# Patient Record
Sex: Female | Born: 1935
Health system: Southern US, Community
[De-identification: ages and names within clinical notes are randomized; demographics above are authoritative.]

## PROBLEM LIST (undated history)

## (undated) DIAGNOSIS — F419 Anxiety disorder, unspecified: Secondary | ICD-10-CM

## (undated) DIAGNOSIS — R011 Cardiac murmur, unspecified: Secondary | ICD-10-CM

## (undated) DIAGNOSIS — K56609 Unspecified intestinal obstruction, unspecified as to partial versus complete obstruction: Secondary | ICD-10-CM

## (undated) DIAGNOSIS — M810 Age-related osteoporosis without current pathological fracture: Secondary | ICD-10-CM

## (undated) DIAGNOSIS — K219 Gastro-esophageal reflux disease without esophagitis: Secondary | ICD-10-CM

## (undated) DIAGNOSIS — C4490 Unspecified malignant neoplasm of skin, unspecified: Secondary | ICD-10-CM

## (undated) DIAGNOSIS — E785 Hyperlipidemia, unspecified: Secondary | ICD-10-CM

## (undated) DIAGNOSIS — R112 Nausea with vomiting, unspecified: Secondary | ICD-10-CM

## (undated) DIAGNOSIS — Z9889 Other specified postprocedural states: Secondary | ICD-10-CM

## (undated) HISTORY — PX: DILATION AND CURETTAGE OF UTERUS: SHX78

## (undated) HISTORY — DX: Unspecified malignant neoplasm of skin, unspecified: C44.90

## (undated) HISTORY — PX: BREAST BIOPSY: SHX20

## (undated) HISTORY — DX: Hyperlipidemia, unspecified: E78.5

## (undated) HISTORY — DX: Anxiety disorder, unspecified: F41.9

## (undated) HISTORY — DX: Age-related osteoporosis without current pathological fracture: M81.0

## (undated) HISTORY — DX: Gastro-esophageal reflux disease without esophagitis: K21.9

## (undated) HISTORY — PX: ESOPHAGOGASTRODUODENOSCOPY: SHX1529

## (undated) HISTORY — DX: Unspecified intestinal obstruction, unspecified as to partial versus complete obstruction: K56.609

## (undated) HISTORY — PX: TONSILLECTOMY: SUR1361

## (undated) HISTORY — PX: BREAST EXCISIONAL BIOPSY: SUR124

## (undated) HISTORY — PX: SKIN CANCER EXCISION: SHX779

## (undated) HISTORY — PX: CATARACT EXTRACTION W/ INTRAOCULAR LENS  IMPLANT, BILATERAL: SHX1307

---

## 2000-02-08 ENCOUNTER — Other Ambulatory Visit: Admission: RE | Admit: 2000-02-08 | Discharge: 2000-02-08 | Payer: Self-pay | Admitting: *Deleted

## 2002-10-26 ENCOUNTER — Other Ambulatory Visit: Admission: RE | Admit: 2002-10-26 | Discharge: 2002-10-26 | Payer: Self-pay | Admitting: Family Medicine

## 2003-10-23 HISTORY — PX: COLONOSCOPY: SHX174

## 2004-09-01 ENCOUNTER — Ambulatory Visit (HOSPITAL_COMMUNITY): Admission: RE | Admit: 2004-09-01 | Discharge: 2004-09-01 | Payer: Self-pay | Admitting: Gastroenterology

## 2006-09-16 ENCOUNTER — Other Ambulatory Visit: Admission: RE | Admit: 2006-09-16 | Discharge: 2006-09-16 | Payer: Self-pay | Admitting: Family Medicine

## 2012-09-11 ENCOUNTER — Other Ambulatory Visit: Payer: Self-pay | Admitting: Family Medicine

## 2012-09-11 DIAGNOSIS — Z1231 Encounter for screening mammogram for malignant neoplasm of breast: Secondary | ICD-10-CM

## 2012-10-07 ENCOUNTER — Encounter: Payer: Self-pay | Admitting: Gastroenterology

## 2012-10-17 ENCOUNTER — Ambulatory Visit
Admission: RE | Admit: 2012-10-17 | Discharge: 2012-10-17 | Disposition: A | Discharge status: Home / Self Care | Payer: Medicare Other | Source: Ambulatory Visit | Attending: Family Medicine | Admitting: Family Medicine

## 2012-10-17 ENCOUNTER — Ambulatory Visit: Payer: Self-pay

## 2012-10-17 DIAGNOSIS — Z1231 Encounter for screening mammogram for malignant neoplasm of breast: Secondary | ICD-10-CM

## 2012-10-29 ENCOUNTER — Ambulatory Visit: Payer: Self-pay | Admitting: Internal Medicine

## 2012-10-31 ENCOUNTER — Encounter: Payer: Self-pay | Admitting: Gastroenterology

## 2012-10-31 ENCOUNTER — Ambulatory Visit (INDEPENDENT_AMBULATORY_CARE_PROVIDER_SITE_OTHER): Payer: Medicare Other | Admitting: Gastroenterology

## 2012-10-31 VITALS — BP 108/64 | HR 78 | Ht 59.0 in | Wt 125.1 lb

## 2012-10-31 DIAGNOSIS — R1319 Other dysphagia: Secondary | ICD-10-CM

## 2012-10-31 DIAGNOSIS — R1310 Dysphagia, unspecified: Secondary | ICD-10-CM

## 2012-10-31 DIAGNOSIS — R1314 Dysphagia, pharyngoesophageal phase: Secondary | ICD-10-CM

## 2012-10-31 DIAGNOSIS — R1013 Epigastric pain: Secondary | ICD-10-CM

## 2012-10-31 DIAGNOSIS — K219 Gastro-esophageal reflux disease without esophagitis: Secondary | ICD-10-CM

## 2012-10-31 MED ORDER — DEXLANSOPRAZOLE 60 MG PO CPDR: 60.0000 mg | DELAYED_RELEASE_CAPSULE | Freq: Every day | ORAL | Status: DC

## 2012-10-31 NOTE — Progress Notes (Signed)
History of Present Illness:  This is a very pleasant 79 year old Caucasian female accompanied by her daughter today for her interview and exam.  This patient does not like to take medications and is very verbal about this concern.  She actually has been in good health all of her life without serious medical or surgical problems but is on Crestor and Zoloft.  She apparently had colonoscopy by Dr. Jeralene Peters 6-7 years ago which was apparently normal.  I do not have this record for review.  Her main complaint now is of intermittent postprandial discomfort in the subxiphoid area followed by emesis of partially digested food.  It is no liquid dysphagia or symptoms of aspiration or choking.  She really denies nausea, or any specific hepatobiliary complaints.  This pain and emesis happens almost daily ,and is accompanied by mild acid reflux symptoms.  She has been on Zantac 75 mg a day for several weeks with some improvement.  There is no history of typical regurgitation or burning substernal chest pain.  The patient has not had previous barium swallow, endoscopy or ultrasound exam.  Her bowels are regular and she denies melena, hematochezia, anorexia, weight loss, or any specific food intolerances.  Her daughter does suffer from gallbladder disease and has had cholecystectomy.  I have reviewed this patient's present history, medical and surgical past history, allergies and medications.     ROS: The remainder of the 10 point ROS is negative     Physical Exam: Blood pressure 108/64, pulse 78 and regular, and weight 125 pounds with a BMI of 25.27. General well developed well nourished patient in no acute distress, appearing their stated age Eyes PERRLA, no icterus, fundoscopic exam per opthamologist Skin no lesions noted Neck supple, no adenopathy, no thyroid enlargement, no tenderness Chest clear to percussion and auscultation Heart no significant murmurs, gallops or rubs noted Abdomen no hepatosplenomegaly  masses or tenderness, BS normal. . Extremities no acute joint lesions, edema, phlebitis or evidence of cellulitis. Neurologic patient oriented x 3, cranial nerves intact, no focal neurologic deficits noted. Psychological mental status normal and normal affect. , Assessment and plan: Probable prominent hiatal hernia with acid reflux and secondary peptic stricture of her esophagus.  She has no symptoms to suggest achalasia.  I am somewhat concerned about her abdominal pain and have ordered gallbladder ultrasound to be complete.  She is been changed from Zantac to Dexilant 60 mg every morning with standard antireflux maneuvers.  Outpatient endoscopy and probable endoscopy scheduled.  I have reviewed the risk and benefits of endoscopy and esophageal dilatation with the patient and her daughter in detail, and they have agreed to proceed as planned.  She apparently has been told by Dr. Clovis Riley that she does not need followup colonoscopy.  Patient video started acid reflux and its management shown to the patient and her daughter.  Encounter Diagnoses  Name Primary?  . Abdominal pain Yes  . GERD (gastroesophageal reflux disease)    CC: Cr. Lupe Carney

## 2012-10-31 NOTE — Patient Instructions (Addendum)
You have been scheduled for an endoscopy  with propofol. Please follow the written instructions given to you at your visit today. Please pick up your prep at the pharmacy within the next 1-3 days. If you use inhalers (even only as needed) or a CPAP machine, please bring them with you on the day of your procedure. You have been scheduled for an abdominal ultrasound at [facility] (1st floor of hospital) on 11/04/12  9 am . Please arrive 15 minutes prior to your appointment for registration. Make certain not to have anything to eat or drink 6 hours prior to your appointment. Should you need to reschedule your appointment, please contact radiology at [phone]. This test typically takes about 30 minutes to perform. Stop the Zantac and start the Dexilant samples provided to you.  A prescription has also been sent to your pharmacy.   Gastroesophageal Reflux Disease, Adult Gastroesophageal reflux disease (GERD) happens when acid from your stomach flows up into the esophagus. When acid comes in contact with the esophagus, the acid causes soreness (inflammation) in the esophagus. Over time, GERD may create small holes (ulcers) in the lining of the esophagus. CAUSES   Increased body weight. This puts pressure on the stomach, making acid rise from the stomach into the esophagus.  Smoking. This increases acid production in the stomach.  Drinking alcohol. This causes decreased pressure in the lower esophageal sphincter (valve or ring of muscle between the esophagus and stomach), allowing acid from the stomach into the esophagus.  Late evening meals and a full stomach. This increases pressure and acid production in the stomach.  A malformed lower esophageal sphincter. Sometimes, no cause is found. SYMPTOMS   Burning pain in the lower part of the mid-chest behind the breastbone and in the mid-stomach area. This may occur twice a week or more often.  Trouble swallowing.  Sore throat.  Dry  cough.  Asthma-like symptoms including chest tightness, shortness of breath, or wheezing. DIAGNOSIS  Your caregiver may be able to diagnose GERD based on your symptoms. In some cases, X-rays and other tests may be done to check for complications or to check the condition of your stomach and esophagus. TREATMENT  Your caregiver may recommend over-the-counter or prescription medicines to help decrease acid production. Ask your caregiver before starting or adding any new medicines.  HOME CARE INSTRUCTIONS   Change the factors that you can control. Ask your caregiver for guidance concerning weight loss, quitting smoking, and alcohol consumption.  Avoid foods and drinks that make your symptoms worse, such as:  Caffeine or alcoholic drinks.  Chocolate.  Peppermint or mint flavorings.  Garlic and onions.  Spicy foods.  Citrus fruits, such as oranges, lemons, or limes.  Tomato-based foods such as sauce, chili, salsa, and pizza.  Fried and fatty foods.  Avoid lying down for the 3 hours prior to your bedtime or prior to taking a nap.  Eat small, frequent meals instead of large meals.  Wear loose-fitting clothing. Do not wear anything tight around your waist that causes pressure on your stomach.  Raise the head of your bed 6 to 8 inches with wood blocks to help you sleep. Extra pillows will not help.  Only take over-the-counter or prescription medicines for pain, discomfort, or fever as directed by your caregiver.  Do not take aspirin, ibuprofen, or other nonsteroidal anti-inflammatory drugs (NSAIDs). SEEK IMMEDIATE MEDICAL CARE IF:   You have pain in your arms, neck, jaw, teeth, or back.  Your pain increases  or changes in intensity or duration.  You develop nausea, vomiting, or sweating (diaphoresis).  You develop shortness of breath, or you faint.  Your vomit is green, yellow, black, or looks like coffee grounds or blood.  Your stool is red, bloody, or black. These  symptoms could be signs of other problems, such as heart disease, gastric bleeding, or esophageal bleeding. MAKE SURE YOU:   Understand these instructions.  Will watch your condition.  Will get help right away if you are not doing well or get worse. Document Released: 07/18/2005 Document Revised: 12/31/2011 Document Reviewed: 04/27/2011 [facility] Patient Information 2013 [place], [place].

## 2012-11-03 ENCOUNTER — Telehealth: Payer: Self-pay | Admitting: *Deleted

## 2012-11-03 NOTE — Telephone Encounter (Signed)
I called Medicare [phone] for Dexilant prior authorization.  Liborio Nixon at [facility] said that she will fax over authorization to be completed.  Filled out and faxed back. ______________________________________________________________________________________________________________________  I called patient to notify her that we had to do prior authorization for Dexilant.  Patient said that she has samples of Dexilant, enough to get to her procedure on 1-15, and she would like to talk to Dr. Jarold Motto before getting more Dexilant or trying another PPI that's covered by Medicare.  Patient stated that she will let me know after procedure what plans she wants to take

## 2012-11-04 ENCOUNTER — Ambulatory Visit (HOSPITAL_COMMUNITY)
Admission: RE | Admit: 2012-11-04 | Discharge: 2012-11-04 | Disposition: A | Discharge status: Home / Self Care | Payer: Medicare Other | Source: Ambulatory Visit | Attending: Gastroenterology | Admitting: Gastroenterology

## 2012-11-04 DIAGNOSIS — R1110 Vomiting, unspecified: Secondary | ICD-10-CM | POA: Insufficient documentation

## 2012-11-04 DIAGNOSIS — K8020 Calculus of gallbladder without cholecystitis without obstruction: Secondary | ICD-10-CM | POA: Insufficient documentation

## 2012-11-04 DIAGNOSIS — N281 Cyst of kidney, acquired: Secondary | ICD-10-CM | POA: Insufficient documentation

## 2012-11-04 DIAGNOSIS — K219 Gastro-esophageal reflux disease without esophagitis: Secondary | ICD-10-CM

## 2012-11-04 DIAGNOSIS — R109 Unspecified abdominal pain: Secondary | ICD-10-CM | POA: Insufficient documentation

## 2012-11-05 ENCOUNTER — Ambulatory Visit (AMBULATORY_SURGERY_CENTER): Payer: Medicare Other | Admitting: Gastroenterology

## 2012-11-05 ENCOUNTER — Encounter: Payer: Self-pay | Admitting: Gastroenterology

## 2012-11-05 VITALS — BP 125/74 | HR 78 | Temp 98.7°F | Resp 19 | Ht 59.0 in | Wt 125.0 lb

## 2012-11-05 DIAGNOSIS — K2940 Chronic atrophic gastritis without bleeding: Secondary | ICD-10-CM

## 2012-11-05 DIAGNOSIS — K2970 Gastritis, unspecified, without bleeding: Secondary | ICD-10-CM

## 2012-11-05 DIAGNOSIS — K2950 Unspecified chronic gastritis without bleeding: Secondary | ICD-10-CM

## 2012-11-05 DIAGNOSIS — D131 Benign neoplasm of stomach: Secondary | ICD-10-CM

## 2012-11-05 DIAGNOSIS — Z8719 Personal history of other diseases of the digestive system: Secondary | ICD-10-CM

## 2012-11-05 DIAGNOSIS — K317 Polyp of stomach and duodenum: Secondary | ICD-10-CM

## 2012-11-05 DIAGNOSIS — K2990 Gastroduodenitis, unspecified, without bleeding: Secondary | ICD-10-CM

## 2012-11-05 DIAGNOSIS — K219 Gastro-esophageal reflux disease without esophagitis: Secondary | ICD-10-CM

## 2012-11-05 DIAGNOSIS — Q398 Other congenital malformations of esophagus: Secondary | ICD-10-CM

## 2012-11-05 DIAGNOSIS — R112 Nausea with vomiting, unspecified: Secondary | ICD-10-CM

## 2012-11-05 MED ORDER — SODIUM CHLORIDE 0.9 % IV SOLN: 500.0000 mL | INTRAVENOUS | Status: DC

## 2012-11-05 NOTE — Progress Notes (Signed)
Lidocaine-40mg IV prior to Propofol InductionPropofol given over incremental dosages 

## 2012-11-05 NOTE — Progress Notes (Signed)
Patient did not experience any of the following events: a burn prior to discharge; a fall within the facility; wrong site/side/patient/procedure/implant event; or a hospital transfer or hospital admission upon discharge from the facility. (G8907) Patient did not have preoperative order for IV antibiotic SSI prophylaxis. (G8918)  

## 2012-11-05 NOTE — Patient Instructions (Addendum)
Discharge instructions given with verbal understanding. Biopsies taken. Resume previous medications. YOU HAD AN ENDOSCOPIC PROCEDURE TODAY AT THE [facility]: Refer to the procedure report that was given to you for any specific questions about what was found during the examination.  If the procedure report does not answer your questions, please call your gastroenterologist to clarify.  If you requested that your care partner not be given the details of your procedure findings, then the procedure report has been included in a sealed envelope for you to review at your convenience later.  YOU SHOULD EXPECT: Some feelings of bloating in the abdomen. Passage of more gas than usual.  Walking can help get rid of the air that was put into your GI tract during the procedure and reduce the bloating. If you had a lower endoscopy (such as a colonoscopy or flexible sigmoidoscopy) you may notice spotting of blood in your stool or on the toilet paper. If you underwent a bowel prep for your procedure, then you may not have a normal bowel movement for a few days.  DIET: Your first meal following the procedure should be a light meal and then it is ok to progress to your normal diet.  A half-sandwich or bowl of soup is an example of a good first meal.  Heavy or fried foods are harder to digest and may make you feel nauseous or bloated.  Likewise meals heavy in dairy and vegetables can cause extra gas to form and this can also increase the bloating.  Drink plenty of fluids but you should avoid alcoholic beverages for 24 hours.  ACTIVITY: Your care partner should take you home directly after the procedure.  You should plan to take it easy, moving slowly for the rest of the day.  You can resume normal activity the day after the procedure however you should NOT DRIVE or use heavy machinery for 24 hours (because of the sedation medicines used during the test).    SYMPTOMS TO REPORT IMMEDIATELY: A gastroenterologist  can be reached at any hour.  During normal business hours, 8:30 AM to 5:00 PM Monday through Friday, call [phone].  After hours and on weekends, please call the GI answering service at [phone] who will take a message and have the physician on call contact you.   Following upper endoscopy (EGD)  Vomiting of blood or coffee ground material  New chest pain or pain under the shoulder blades  Painful or persistently difficult swallowing  New shortness of breath  Fever of 100F or higher  Black, tarry-looking stools  FOLLOW UP: If any biopsies were taken you will be contacted by phone or by letter within the next 1-3 weeks.  Call your gastroenterologist if you have not heard about the biopsies in 3 weeks.  Our staff will call the home number listed on your records the next business day following your procedure to check on you and address any questions or concerns that you may have at that time regarding the information given to you following your procedure. This is a courtesy call and so if there is no answer at the home number and we have not heard from you through the emergency physician on call, we will assume that you have returned to your regular daily activities without incident.  SIGNATURES/CONFIDENTIALITY: You and/or your care partner have signed paperwork which will be entered into your electronic medical record.  These signatures attest to the fact that that the information above on your After   Visit Summary has been reviewed and is understood.  Full responsibility of the confidentiality of this discharge information lies with you and/or your care-partner. 

## 2012-11-05 NOTE — Progress Notes (Signed)
Called to room to assist during endoscopic procedure.  Patient ID and intended procedure confirmed with present staff. Received instructions for my participation in the procedure from the performing physician. ewm 

## 2012-11-05 NOTE — Op Note (Signed)
[facility] [address] [place], [address]   ENDOSCOPY PROCEDURE REPORT  PATIENT: Frances Ortiz, Frances Ortiz  MR#: [id number] BIRTHDATE: 1930-12-24 , 81  yrs. old GENDER: Female ENDOSCOPIST:Santos Hardwick Hale Bogus, MD, [facility] REFERRED BY: PROCEDURE DATE:  11/05/2012 PROCEDURE:   EGD w/ biopsy for H.pylori and EGD w/ biopsy ASA CLASS:    Class I INDICATIONS: Epigastric pain, Nausea, and abnormal ultrasound of the GI tract,. MEDICATION: propofol (Diprivan) 100mg  IV TOPICAL ANESTHETIC:  DESCRIPTION OF PROCEDURE:   After the risks and benefits of the procedure were explained, informed consent was obtained.  The LB GIF-H180 [id number]  endoscope was introduced through the mouth  and advanced to the second portion of the duodenum .  The instrument was slowly withdrawn as the mucosa was fully examined.      DUODENUM: Mild duodenal inflammation was found in the duodenal bulb.   STOMACH: An innumerable number of polypoid shaped sessile polyps ranging between 3-52mm in size were found in the gastric body and gastric fundus.  Multiple biopsies was performed using cold forceps.  Sample obtained for helicobacter pylori testing.  Sample sent for histology.  ESOPHAGUS: The mucosa of the esophagus appeared normal.  Several inlet pouches in proximal esophagus.See pictures...  Retroflexed views revealed Gastric polyps.    The scope was then withdrawn from the patient and the procedure completed.  COMPLICATIONS: There were no complications.   ENDOSCOPIC IMPRESSION: 1.   Duodenal inflammation was found in the duodenal bulb ,no ulcers 2.   Innumerable number of sessile polyps ranging between 3-35mm in size were found in the gastric body and gastric fundus ..probable PPI use. 3. GERD and "inlet pouches".Marland KitchenMarland Kitchen?? n and v from gallstones.... RECOMMENDATIONS: 1.  Await biopsy results...see me 2 weeks 2.  Continue PPI 3.  Rx CLO if positive       _______________________________ eSigned:   Mardella Layman, MD, [facility] 11/05/2012 2:10 PM   antireflux   PATIENT NAME:  Frances Ortiz, Frances Ortiz MR#: [id number]

## 2012-11-06 ENCOUNTER — Telehealth: Payer: Self-pay

## 2012-11-06 ENCOUNTER — Telehealth: Payer: Self-pay | Admitting: *Deleted

## 2012-11-06 NOTE — Telephone Encounter (Signed)
lmom for pt to call back. Dr Jarold Motto wants to see her in 2 weeks per his EGD notes.

## 2012-11-06 NOTE — Telephone Encounter (Signed)
  Follow up Call-  Call back number 11/05/2012  Post procedure Call Back phone  # [phone]  Permission to leave phone message Yes     Patient questions:  Do you have a fever, pain , or abdominal swelling? no Pain Score  0 *  Have you tolerated food without any problems? yes  Have you been able to return to your normal activities? yes  Do you have any questions about your discharge instructions: Diet   no Medications  no Follow up visit  no  Do you have questions or concerns about your Care? no  Actions: * If pain score is 4 or above: No action needed, pain <4.

## 2012-11-07 NOTE — Telephone Encounter (Signed)
Pt called back and she has already scheduled an appt for 11/20/12.

## 2012-11-11 ENCOUNTER — Encounter: Payer: Self-pay | Admitting: Gastroenterology

## 2012-11-12 ENCOUNTER — Telehealth: Payer: Self-pay | Admitting: Gastroenterology

## 2012-11-12 DIAGNOSIS — K828 Other specified diseases of gallbladder: Secondary | ICD-10-CM

## 2012-11-12 DIAGNOSIS — K8020 Calculus of gallbladder without cholecystitis without obstruction: Secondary | ICD-10-CM

## 2012-11-12 NOTE — Telephone Encounter (Signed)
Has gallstones noted on ultrasound.  She needs to go the emergency room and needs surgical evaluation.

## 2012-11-12 NOTE — Telephone Encounter (Signed)
Upset she has not heard back from Message left this am

## 2012-11-12 NOTE — Telephone Encounter (Signed)
Informed pt she needs a surgical referral; she has no preference.

## 2012-11-12 NOTE — Telephone Encounter (Signed)
Pt had an Abd U/S on 10/31/12 showing a gallstone and sludge in the gallbladder. She had an EGD on 11/05/12 with benign path and Dr Jarold Motto questioned gallstones on the report. Pt reports she has done well with Dexilant and her diet. Last night she started vomiting with yellow emesis and the vomiting continued all night. She tried to drink water, but the pain the size of a thumb at her sternum was terrible. She has not had anything to eat or drink yet. Asked pt to try to drink and if tolerated, advance her diet; call for problems. Dr Jarold Motto, because of the sludge, do you want a HIDA scan or midlevel visit? Thanks.

## 2012-11-13 ENCOUNTER — Encounter (INDEPENDENT_AMBULATORY_CARE_PROVIDER_SITE_OTHER): Payer: Self-pay | Admitting: General Surgery

## 2012-11-13 ENCOUNTER — Ambulatory Visit (INDEPENDENT_AMBULATORY_CARE_PROVIDER_SITE_OTHER): Payer: Medicare Other | Admitting: General Surgery

## 2012-11-13 ENCOUNTER — Telehealth: Payer: Self-pay | Admitting: *Deleted

## 2012-11-13 VITALS — BP 110/72 | HR 72 | Resp 16 | Ht 59.0 in | Wt 121.0 lb

## 2012-11-13 DIAGNOSIS — K8020 Calculus of gallbladder without cholecystitis without obstruction: Secondary | ICD-10-CM

## 2012-11-13 NOTE — Telephone Encounter (Signed)
See encounter from 09/23/13.

## 2012-11-13 NOTE — Telephone Encounter (Signed)
Message copied by Florene Glen on Thu Nov 13, 2012  9:20 AM ------      Message from: Zacarias Pontes      Created: Thu Nov 13, 2012  8:10 AM       Can get her in today ..2..30 arrival for a 3 oclock apt please send notes to [phone] asap.Marland KitchenMarland KitchenLiborio Nixon      ----- Message -----         From: Linna Hoff, RN         Sent: 11/12/2012  12:10 PM           To: Marnette Burgess            [facility], need surgical referral, 1st available, for symptomatic gallbladder pain, gallstones and sludge on u/s. Thanks so much.

## 2012-11-13 NOTE — Progress Notes (Signed)
Subjective:     [name], female   DOB: 23-Oct-1930, 79 y.o.   MRN: [id number]  HPI We're asked to see the patient in consultation by Dr. Jarold Motto to violate her for gallstones. The patient is a 79 year old white female who has been experiencing episodes of epigastric pain for the last 2 years. The pain has been associated with vomiting. The pain has gotten more severe lately. She recently underwent an ultrasound which did show a large 2.5 cm gallstone in her gallbladder. There was no gallbladder wall thickening or ductal dilatation.  Review of Systems  Constitutional: Positive for unexpected weight change.  HENT: Negative.   Eyes: Negative.   Respiratory: Negative.   Cardiovascular: Negative.   Gastrointestinal: Positive for vomiting and abdominal pain.  Genitourinary: Negative.   Musculoskeletal: Negative.   Skin: Negative.   Neurological: Negative.   Hematological: Negative.   Psychiatric/Behavioral: Negative.        Objective:   Physical Exam  Constitutional: She is oriented to person, place, and time. She appears well-developed and well-nourished.  HENT:  Head: Normocephalic and atraumatic.  Eyes: Conjunctivae normal and EOM are normal. Pupils are equal, round, and reactive to light.  Neck: Normal range of motion. Neck supple.  Cardiovascular: Normal rate, regular rhythm and normal heart sounds.   Pulmonary/Chest: Effort normal and breath sounds normal.  Abdominal: Soft. Bowel sounds are normal. She exhibits no mass. There is tenderness.  Musculoskeletal: Normal range of motion.  Lymphadenopathy:    She has no cervical adenopathy.  Neurological: She is alert and oriented to person, place, and time.  Skin: Skin is warm and dry.  Psychiatric: She has a normal mood and affect. Her behavior is normal.       Assessment:     The patient appears to have symptomatic gallstones. Because of the risk of further painful episodes and possible pancreatitis I think  she would benefit from having her gallbladder removed. I've discussed with her in detail the risk and benefits of the operation to remove the gallbladder as well as some of the technical aspects and she understands and wishes to proceed    Plan:     Plan for laparoscopic cholecystectomy with intraoperative cholangiogram

## 2012-11-13 NOTE — Telephone Encounter (Signed)
Phoned pt and she has already been contacted by [facility].

## 2012-11-13 NOTE — Patient Instructions (Signed)
Plan for lap chole with ioc 

## 2012-11-19 ENCOUNTER — Encounter (HOSPITAL_COMMUNITY): Payer: Self-pay | Admitting: Pharmacy Technician

## 2012-11-20 ENCOUNTER — Ambulatory Visit: Payer: Medicare Other | Admitting: Gastroenterology

## 2012-11-24 NOTE — Pre-Procedure Instructions (Signed)
SHWANDA SOLTIS  11/24/2012   Your procedure is scheduled on:  Thursday, February 6th  Report to [facility] at 0900 AM.  Call this number if you have problems the morning of surgery: [phone]   Remember:   Do not eat food or drink liquids after midnight.    Take these medicines the morning of surgery with A SIP OF WATER: zoloft   Do not wear jewelry, make-up or nail polish.  Do not wear lotions, powders, or perfumes. You may not wear deodorant.  Do not shave 48 hours prior to surgery. Men may shave face and neck.  Do not bring valuables to the hospital.  Contacts, dentures or bridgework may not be worn into surgery.  Leave suitcase in the car. After surgery it may be brought to your room.  For patients admitted to the hospital, checkout time is 11:00 AM the day of discharge.   Patients discharged the day of surgery will not be allowed to drive home.    Special Instructions: Shower using CHG 2 nights before surgery and the night before surgery.  If you shower the day of surgery use CHG.  Use special wash - you have one bottle of CHG for all showers.  You should use approximately 1/3 of the bottle for each shower.   Please read over the following fact sheets that you were given: Pain Booklet, Coughing and Deep Breathing, MRSA Information and Surgical Site Infection Prevention

## 2012-11-25 ENCOUNTER — Encounter (HOSPITAL_COMMUNITY): Payer: Self-pay

## 2012-11-25 ENCOUNTER — Encounter (HOSPITAL_COMMUNITY)
Admission: RE | Admit: 2012-11-25 | Discharge: 2012-11-25 | Disposition: A | Discharge status: Home / Self Care | Payer: Medicare Other | Source: Ambulatory Visit | Attending: General Surgery | Admitting: General Surgery

## 2012-11-25 HISTORY — DX: Cardiac murmur, unspecified: R01.1

## 2012-11-25 HISTORY — DX: Other specified postprocedural states: Z98.890

## 2012-11-25 HISTORY — DX: Nausea with vomiting, unspecified: R11.2

## 2012-11-25 LAB — COMPREHENSIVE METABOLIC PANEL
BUN: 25 mg/dL — ABNORMAL HIGH (ref 6–23)
Calcium: 9.5 mg/dL (ref 8.4–10.5)
GFR calc Af Amer: >90 mL/min (ref >90)
Glucose, Bld: 85 mg/dL (ref 70–99)
Total Protein: 7.6 g/dL (ref 6.0–8.3)

## 2012-11-25 LAB — SURGICAL PCR SCREEN
MRSA, PCR: NEGATIVE
Staphylococcus aureus: POSITIVE — AB

## 2012-11-25 LAB — CBC
Hemoglobin: 14.3 g/dL (ref 12.0–15.0)
MCHC: 34 g/dL (ref 30.0–36.0)
RDW: 12.2 % (ref 11.5–15.5)

## 2012-11-25 NOTE — Progress Notes (Signed)
Primary Physician - Dr. Wylie Hail Echo 4 years ago - will request records from Dr. Ledell Peoples office No other cardiac testing

## 2012-11-26 MED ORDER — CEFAZOLIN SODIUM-DEXTROSE 2-3 GM-% IV SOLR
2.0000 g | INTRAVENOUS | Status: AC
  Administered 2012-11-27: 2 g via INTRAVENOUS
  Filled 2012-11-26: qty 50

## 2012-11-26 MED ORDER — CHLORHEXIDINE GLUCONATE 4 % EX LIQD: 1.0000 "application " | Freq: Once | CUTANEOUS | Status: DC

## 2012-11-27 ENCOUNTER — Encounter (HOSPITAL_COMMUNITY): Payer: Self-pay | Admitting: *Deleted

## 2012-11-27 ENCOUNTER — Encounter (HOSPITAL_COMMUNITY)
Admission: RE | Disposition: A | Discharge status: Home / Self Care | Payer: Self-pay | Source: Ambulatory Visit | Attending: General Surgery

## 2012-11-27 ENCOUNTER — Encounter (HOSPITAL_COMMUNITY): Payer: Self-pay | Admitting: Anesthesiology

## 2012-11-27 ENCOUNTER — Ambulatory Visit (HOSPITAL_COMMUNITY): Payer: Medicare Other

## 2012-11-27 ENCOUNTER — Ambulatory Visit (HOSPITAL_COMMUNITY)
Admission: RE | Admit: 2012-11-27 | Discharge: 2012-11-27 | Disposition: A | Discharge status: Home / Self Care | Payer: Medicare Other | Source: Ambulatory Visit | Attending: General Surgery | Admitting: General Surgery

## 2012-11-27 ENCOUNTER — Ambulatory Visit (HOSPITAL_COMMUNITY): Payer: Medicare Other | Admitting: Anesthesiology

## 2012-11-27 DIAGNOSIS — K8010 Calculus of gallbladder with chronic cholecystitis without obstruction: Secondary | ICD-10-CM

## 2012-11-27 DIAGNOSIS — Z01812 Encounter for preprocedural laboratory examination: Secondary | ICD-10-CM | POA: Insufficient documentation

## 2012-11-27 DIAGNOSIS — K8020 Calculus of gallbladder without cholecystitis without obstruction: Secondary | ICD-10-CM | POA: Insufficient documentation

## 2012-11-27 HISTORY — PX: CHOLECYSTECTOMY: SHX55

## 2012-11-27 SURGERY — LAPAROSCOPIC CHOLECYSTECTOMY WITH INTRAOPERATIVE CHOLANGIOGRAM
Anesthesia: General | Site: Abdomen | Wound class: Contaminated

## 2012-11-27 MED ORDER — FENTANYL CITRATE 0.05 MG/ML IJ SOLN
INTRAMUSCULAR | Status: DC | PRN
  Administered 2012-11-27 (×2): 50 ug via INTRAVENOUS

## 2012-11-27 MED ORDER — SODIUM CHLORIDE 0.9 % IV SOLN
INTRAVENOUS | Status: DC | PRN
  Administered 2012-11-27: 11:00:00

## 2012-11-27 MED ORDER — NEOSTIGMINE METHYLSULFATE 1 MG/ML IJ SOLN
INTRAMUSCULAR | Status: DC | PRN
  Administered 2012-11-27: 1 mg via INTRAVENOUS
  Administered 2012-11-27: 4 mg via INTRAVENOUS

## 2012-11-27 MED ORDER — SODIUM CHLORIDE 0.9 % IR SOLN
Status: DC | PRN
  Administered 2012-11-27: 1

## 2012-11-27 MED ORDER — GLYCOPYRROLATE 0.2 MG/ML IJ SOLN
INTRAMUSCULAR | Status: DC | PRN
  Administered 2012-11-27: 0.1 mg via INTRAVENOUS
  Administered 2012-11-27: 0.6 mg via INTRAVENOUS

## 2012-11-27 MED ORDER — SODIUM CHLORIDE 0.9 % IJ SOLN: 3.0000 mL | INTRAMUSCULAR | Status: DC | PRN

## 2012-11-27 MED ORDER — MUPIROCIN 2 % EX OINT
TOPICAL_OINTMENT | Freq: Two times a day (BID) | CUTANEOUS | Status: DC
  Administered 2012-11-27: 1 via NASAL
  Filled 2012-11-27: qty 22

## 2012-11-27 MED ORDER — ACETAMINOPHEN 650 MG RE SUPP: 650.0000 mg | RECTAL | Status: DC | PRN

## 2012-11-27 MED ORDER — KCL IN DEXTROSE-NACL 20-5-0.9 MEQ/L-%-% IV SOLN: INTRAVENOUS | Status: DC

## 2012-11-27 MED ORDER — MUPIROCIN 2 % EX OINT
TOPICAL_OINTMENT | CUTANEOUS | Status: AC
  Filled 2012-11-27: qty 22

## 2012-11-27 MED ORDER — MORPHINE SULFATE 2 MG/ML IJ SOLN: 2.0000 mg | INTRAMUSCULAR | Status: DC | PRN

## 2012-11-27 MED ORDER — BUPIVACAINE-EPINEPHRINE PF 0.5-1:200000 % IJ SOLN
INTRAMUSCULAR | Status: AC
  Filled 2012-11-27: qty 30

## 2012-11-27 MED ORDER — HYDROCODONE-ACETAMINOPHEN 5-325 MG PO TABS: 1.0000 | ORAL_TABLET | Freq: Four times a day (QID) | ORAL | Status: DC | PRN

## 2012-11-27 MED ORDER — LACTATED RINGERS IV SOLN
INTRAVENOUS | Status: DC | PRN
  Administered 2012-11-27 (×2): via INTRAVENOUS

## 2012-11-27 MED ORDER — MIDAZOLAM HCL 5 MG/5ML IJ SOLN
INTRAMUSCULAR | Status: DC | PRN
  Administered 2012-11-27 (×2): 1 mg via INTRAVENOUS

## 2012-11-27 MED ORDER — SODIUM CHLORIDE 0.9 % IV SOLN: 250.0000 mL | INTRAVENOUS | Status: DC | PRN

## 2012-11-27 MED ORDER — BUPIVACAINE HCL (PF) 0.25 % IJ SOLN
INTRAMUSCULAR | Status: AC
  Filled 2012-11-27: qty 30

## 2012-11-27 MED ORDER — ACETAMINOPHEN 10 MG/ML IV SOLN: 1000.0000 mg | Freq: Once | INTRAVENOUS | Status: DC | PRN

## 2012-11-27 MED ORDER — HYDROMORPHONE HCL PF 1 MG/ML IJ SOLN: 0.2500 mg | INTRAMUSCULAR | Status: DC | PRN

## 2012-11-27 MED ORDER — LIDOCAINE HCL (CARDIAC) 20 MG/ML IV SOLN
INTRAVENOUS | Status: DC | PRN
  Administered 2012-11-27: 50 mg via INTRAVENOUS

## 2012-11-27 MED ORDER — ONDANSETRON HCL 4 MG/2ML IJ SOLN: 4.0000 mg | Freq: Four times a day (QID) | INTRAMUSCULAR | Status: DC | PRN

## 2012-11-27 MED ORDER — OXYCODONE HCL 5 MG PO TABS: 5.0000 mg | ORAL_TABLET | ORAL | Status: DC | PRN

## 2012-11-27 MED ORDER — OXYCODONE HCL 5 MG/5ML PO SOLN: 5.0000 mg | Freq: Once | ORAL | Status: DC | PRN

## 2012-11-27 MED ORDER — MEPERIDINE HCL 25 MG/ML IJ SOLN: 6.2500 mg | INTRAMUSCULAR | Status: DC | PRN

## 2012-11-27 MED ORDER — ONDANSETRON HCL 4 MG/2ML IJ SOLN
INTRAMUSCULAR | Status: DC | PRN
  Administered 2012-11-27: 4 mg via INTRAVENOUS

## 2012-11-27 MED ORDER — PROPOFOL 10 MG/ML IV BOLUS
INTRAVENOUS | Status: DC | PRN
  Administered 2012-11-27: 100 mg via INTRAVENOUS
  Administered 2012-11-27: 40 mg via INTRAVENOUS

## 2012-11-27 MED ORDER — LACTATED RINGERS IV SOLN
INTRAVENOUS | Status: DC
  Administered 2012-11-27: 10:00:00 via INTRAVENOUS

## 2012-11-27 MED ORDER — BUPIVACAINE-EPINEPHRINE PF 0.5-1:200000 % IJ SOLN
INTRAMUSCULAR | Status: DC | PRN
  Administered 2012-11-27: 30 mL

## 2012-11-27 MED ORDER — OXYCODONE HCL 5 MG PO TABS: 5.0000 mg | ORAL_TABLET | Freq: Once | ORAL | Status: DC | PRN

## 2012-11-27 MED ORDER — SODIUM CHLORIDE 0.9 % IJ SOLN: 3.0000 mL | Freq: Two times a day (BID) | INTRAMUSCULAR | Status: DC

## 2012-11-27 MED ORDER — ARTIFICIAL TEARS OP OINT
TOPICAL_OINTMENT | OPHTHALMIC | Status: DC | PRN
  Administered 2012-11-27: 1 via OPHTHALMIC

## 2012-11-27 MED ORDER — PROMETHAZINE HCL 25 MG/ML IJ SOLN: 6.2500 mg | INTRAMUSCULAR | Status: DC | PRN

## 2012-11-27 MED ORDER — ACETAMINOPHEN 325 MG PO TABS
650.0000 mg | ORAL_TABLET | ORAL | Status: DC | PRN
  Filled 2012-11-27: qty 2

## 2012-11-27 MED ORDER — ROCURONIUM BROMIDE 100 MG/10ML IV SOLN
INTRAVENOUS | Status: DC | PRN
  Administered 2012-11-27: 10 mg via INTRAVENOUS
  Administered 2012-11-27: 30 mg via INTRAVENOUS

## 2012-11-27 MED ORDER — DEXTROSE 5 % IV SOLN
INTRAVENOUS | Status: DC | PRN
  Administered 2012-11-27: 11:00:00 via INTRAVENOUS

## 2012-11-27 SURGICAL SUPPLY — 50 items
ADH SKN CLS APL DERMABOND .7 (GAUZE/BANDAGES/DRESSINGS) ×1
APPLIER CLIP ROT 10 11.4 M/L (STAPLE) ×2
APR CLP MED LRG 11.4X10 (STAPLE) ×1
BAG SPEC RTRVL LRG 6X4 10 (ENDOMECHANICALS) ×1
BLADE SURG ROTATE 9660 (MISCELLANEOUS) IMPLANT
CANISTER SUCTION 2500CC (MISCELLANEOUS) ×2 IMPLANT
CATH REDDICK CHOLANGI 4FR 50CM (CATHETERS) ×2 IMPLANT
CHLORAPREP W/TINT 26ML (MISCELLANEOUS) ×2 IMPLANT
CLIP APPLIE ROT 10 11.4 M/L (STAPLE) ×1 IMPLANT
CLOTH BEACON ORANGE TIMEOUT ST (SAFETY) ×2 IMPLANT
COVER MAYO STAND STRL (DRAPES) ×2 IMPLANT
COVER SURGICAL LIGHT HANDLE (MISCELLANEOUS) ×2 IMPLANT
DECANTER SPIKE VIAL GLASS SM (MISCELLANEOUS) ×3 IMPLANT
DERMABOND ADVANCED (GAUZE/BANDAGES/DRESSINGS) ×1
DERMABOND ADVANCED .7 DNX12 (GAUZE/BANDAGES/DRESSINGS) ×1 IMPLANT
DRAPE C-ARM 42X72 X-RAY (DRAPES) ×2 IMPLANT
DRAPE UTILITY 15X26 W/TAPE STR (DRAPE) ×4 IMPLANT
ELECT REM PT RETURN 9FT ADLT (ELECTROSURGICAL) ×2
ELECTRODE REM PT RTRN 9FT ADLT (ELECTROSURGICAL) ×1 IMPLANT
GLOVE BIO SURGEON STRL SZ7.5 (GLOVE) ×2 IMPLANT
GLOVE BIOGEL M STRL SZ7.5 (GLOVE) ×1 IMPLANT
GLOVE BIOGEL PI IND STRL 6.5 (GLOVE) IMPLANT
GLOVE BIOGEL PI IND STRL 7.0 (GLOVE) IMPLANT
GLOVE BIOGEL PI IND STRL 8 (GLOVE) ×1 IMPLANT
GLOVE BIOGEL PI INDICATOR 6.5 (GLOVE) ×1
GLOVE BIOGEL PI INDICATOR 7.0 (GLOVE) ×2
GLOVE BIOGEL PI INDICATOR 8 (GLOVE) ×1
GLOVE SURG SS PI 6.5 STRL IVOR (GLOVE) ×6 IMPLANT
GLOVE SURG SS PI 7.0 STRL IVOR (GLOVE) ×1 IMPLANT
GOWN STRL NON-REIN LRG LVL3 (GOWN DISPOSABLE) ×8 IMPLANT
GOWN STRL REIN 2XL LVL4 (GOWN DISPOSABLE) ×1 IMPLANT
IV CATH 14GX2 1/4 (CATHETERS) ×2 IMPLANT
KIT BASIN OR (CUSTOM PROCEDURE TRAY) ×2 IMPLANT
KIT ROOM TURNOVER OR (KITS) ×2 IMPLANT
NS IRRIG 1000ML POUR BTL (IV SOLUTION) ×2 IMPLANT
PAD ARMBOARD 7.5X6 YLW CONV (MISCELLANEOUS) ×2 IMPLANT
POUCH SPECIMEN RETRIEVAL 10MM (ENDOMECHANICALS) ×2 IMPLANT
SCISSORS LAP 5X35 DISP (ENDOMECHANICALS) IMPLANT
SET IRRIG TUBING LAPAROSCOPIC (IRRIGATION / IRRIGATOR) ×2 IMPLANT
SLEEVE ENDOPATH XCEL 5M (ENDOMECHANICALS) ×2 IMPLANT
SPECIMEN JAR MEDIUM (MISCELLANEOUS) ×1 IMPLANT
SPECIMEN JAR SMALL (MISCELLANEOUS) ×1 IMPLANT
SUT MNCRL AB 4-0 PS2 18 (SUTURE) ×2 IMPLANT
SUT VICRYL 0 UR6 27IN ABS (SUTURE) ×1 IMPLANT
TOWEL OR 17X24 6PK STRL BLUE (TOWEL DISPOSABLE) ×2 IMPLANT
TOWEL OR 17X26 10 PK STRL BLUE (TOWEL DISPOSABLE) ×2 IMPLANT
TRAY LAPAROSCOPIC (CUSTOM PROCEDURE TRAY) ×2 IMPLANT
TROCAR XCEL BLUNT TIP 100MML (ENDOMECHANICALS) ×2 IMPLANT
TROCAR XCEL NON-BLD 11X100MML (ENDOMECHANICALS) ×2 IMPLANT
TROCAR XCEL NON-BLD 5MMX100MML (ENDOMECHANICALS) ×2 IMPLANT

## 2012-11-27 NOTE — Preoperative (Signed)
Beta Blockers   Reason not to administer Beta Blockers:Not Applicable 

## 2012-11-27 NOTE — Anesthesia Procedure Notes (Signed)
Procedure Name: Intubation Date/Time: 11/27/2012 10:46 AM Performed by: Marni Griffon Pre-anesthesia Checklist: Patient identified, Emergency Drugs available, Suction available and Patient being monitored Patient Re-evaluated:Patient Re-evaluated prior to inductionPreoxygenation: Pre-oxygenation with 100% oxygen Intubation Type: IV induction Ventilation: Mask ventilation without difficulty Laryngoscope Size: Mac and 3 Grade View: Grade I Tube type: Oral Tube size: 7.5 mm Number of attempts: 1 Airway Equipment and Method: Stylet Placement Confirmation: ETT inserted through vocal cords under direct vision,  breath sounds checked- equal and bilateral and positive ETCO2 Secured at: 21 (cm at teeth) cm Tube secured with: Tape Dental Injury: Teeth and Oropharynx as per pre-operative assessment

## 2012-11-27 NOTE — Op Note (Signed)
11/27/2012  11:38 AM  PATIENT:  Frances Ortiz  79 y.o. female  PRE-OPERATIVE DIAGNOSIS:  CHOLELITHIASIS  POST-OPERATIVE DIAGNOSIS:  CHOLELITHIASIS  PROCEDURE:  Procedure(s) (LRB) with comments: LAPAROSCOPIC CHOLECYSTECTOMY WITH INTRAOPERATIVE CHOLANGIOGRAM (N/A)  SURGEON:  Surgeon(s) and Role:    * Atilano Ina, MD,FACS - Assisting    * Robyne Askew, MD - Primary  PHYSICIAN ASSISTANT:   ASSISTANTS: Dr. Andrey Campanile   ANESTHESIA:   general  EBL:  Total I/O In: 50 [I.V.:50] Out: -   BLOOD ADMINISTERED:none  DRAINS: none   LOCAL MEDICATIONS USED:  MARCAINE     SPECIMEN:  Source of Specimen:  gallbladder  DISPOSITION OF SPECIMEN:  PATHOLOGY  COUNTS:  YES  TOURNIQUET:  * No tourniquets in log *  DICTATION: .Dragon Dictation  Procedure: After informed consent was obtained the patient was brought to the operating room and placed in the supine position on the operating room table. After adequate induction of general anesthesia the patient's abdomen was prepped with ChloraPrep allowed to dry and draped in usual sterile manner. The area below the umbilicus was infiltrated with quarter percent  Marcaine. A small incision was made with a 15 blade knife. The incision was carried down through the subcutaneous tissue bluntly with a hemostat and [employer] retractors. The linea alba was identified. The linea alba was incised with a 15 blade knife and each side was grasped with Coker clamps. The preperitoneal space was then probed with a hemostat until the peritoneum was opened and access was gained to the abdominal cavity. A 0 Vicryl pursestring stitch was placed in the fascia surrounding the opening. A Hassan cannula was then placed through the opening and anchored in place with the previously placed Vicryl purse string stitch. The abdomen was insufflated with carbon dioxide without difficulty. A laparoscope was inserted through the [facility] cannula in the right upper quadrant was inspected.  Next the epigastric region was infiltrated with % Marcaine. A small incision was made with a 15 blade knife. A 10 mm port was placed bluntly through this incision into the abdominal cavity under direct vision. Next 2 sites were chosen laterally on the right side of the abdomen for placement of 5 mm ports. Each of these areas was infiltrated with quarter percent Marcaine. Small stab incisions were made with a 15 blade knife. 5 mm ports were then placed bluntly through these incisions into the abdominal cavity under direct vision without difficulty. A blunt grasper was placed through the lateralmost 5 mm port and used to grasp the dome of the gallbladder and elevated anteriorly and superiorly. Another blunt grasper was placed through the other 5 mm port and used to retract the body and neck of the gallbladder. A dissector was placed through the epigastric port and using the electrocautery the peritoneal reflection at the gallbladder neck was opened. Blunt dissection was then carried out in this area until the gallbladder neck-cystic duct junction was readily identified and a good window was created. A single clip was placed on the gallbladder neck. A small  ductotomy was made just below the clip with laparoscopic scissors. A 14-gauge Angiocath was then placed through the anterior abdominal wall under direct vision. A Reddick cholangiogram catheter was then placed through the Angiocath and flushed. The catheter was then placed in the cystic duct and anchored in place with a clip. A cholangiogram was obtained that showed no filling defects good emptying into the duodenum an adequate length on the cystic duct. The anchoring clip  and catheters were then removed from the patient. 3 clips were placed proximally on the cystic duct and the duct was divided between the 2 sets of clips. Posterior to this the cystic artery was identified and again dissected bluntly in a circumferential manner until a good window  was created. 2  clips were placed proximally and one distally on the artery and the artery was divided between the 2 sets of clips. Next a laparoscopic hook cautery device was used to separate the gallbladder from the liver bed. Prior to completely detaching the gallbladder from the liver bed the liver bed was inspected and several small bleeding points were coagulated with the electrocautery until the area was completely hemostatic. The gallbladder was then detached the rest of it from the liver bed without difficulty. A laparoscopic bag was inserted through the epigastric port. The gallbladder was placed within the bag and the bag was sealed. A laparoscope was then moved to the epigastric port. The gallbladder grasper was placed through the [facility] cannula and used to grasp the opening of the bag. The bag with the gallbladder was then removed with the [facility] cannula through the infraumbilical port without difficulty. The fascial defect had to be enlarged slightly to get the gallbladder out. The fascial defect was then closed with the previously placed Vicryl pursestring stitch as well as with another figure-of-eight 0 Vicryl stitch. The liver bed was inspected again and found to be hemostatic. The abdomen was irrigated with copious amounts of saline until the effluent was clear. The ports were then removed under direct vision without difficulty and were found to be hemostatic. The gas was allowed to escape. The skin incisions were all closed with interrupted 4-0 Monocryl subcuticular stitches. Dermabond dressings were applied. The patient tolerated the procedure well. At the end of the case all needle sponge and instrument counts were correct. The patient was then awakened and taken to recovery in stable condition   PLAN OF CARE: Discharge to home after PACU  PATIENT DISPOSITION:  PACU - hemodynamically stable.   Delay start of Pharmacological VTE agent (>24hrs) due to surgical blood loss or risk of bleeding: not  applicable

## 2012-11-27 NOTE — H&P (View-Only) (Signed)
Subjective:     [name], female   DOB: 05/17/1931, 79 y.o.   MRN: [id number]  HPI We're asked to see the patient in consultation by Dr. Patterson to violate her for gallstones. The patient is a 79-year-old white female who has been experiencing episodes of epigastric pain for the last 2 years. The pain has been associated with vomiting. The pain has gotten more severe lately. She recently underwent an ultrasound which did show a large 2.5 cm gallstone in her gallbladder. There was no gallbladder wall thickening or ductal dilatation.  Review of Systems  Constitutional: Positive for unexpected weight change.  HENT: Negative.   Eyes: Negative.   Respiratory: Negative.   Cardiovascular: Negative.   Gastrointestinal: Positive for vomiting and abdominal pain.  Genitourinary: Negative.   Musculoskeletal: Negative.   Skin: Negative.   Neurological: Negative.   Hematological: Negative.   Psychiatric/Behavioral: Negative.        Objective:   Physical Exam  Constitutional: She is oriented to person, place, and time. She appears well-developed and well-nourished.  HENT:  Head: Normocephalic and atraumatic.  Eyes: Conjunctivae normal and EOM are normal. Pupils are equal, round, and reactive to light.  Neck: Normal range of motion. Neck supple.  Cardiovascular: Normal rate, regular rhythm and normal heart sounds.   Pulmonary/Chest: Effort normal and breath sounds normal.  Abdominal: Soft. Bowel sounds are normal. She exhibits no mass. There is tenderness.  Musculoskeletal: Normal range of motion.  Lymphadenopathy:    She has no cervical adenopathy.  Neurological: She is alert and oriented to person, place, and time.  Skin: Skin is warm and dry.  Psychiatric: She has a normal mood and affect. Her behavior is normal.       Assessment:     The patient appears to have symptomatic gallstones. Because of the risk of further painful episodes and possible pancreatitis I think  she would benefit from having her gallbladder removed. I've discussed with her in detail the risk and benefits of the operation to remove the gallbladder as well as some of the technical aspects and she understands and wishes to proceed    Plan:     Plan for laparoscopic cholecystectomy with intraoperative cholangiogram      

## 2012-11-27 NOTE — Anesthesia Preprocedure Evaluation (Addendum)
Anesthesia Evaluation  Patient identified by MRN, date of birth, ID band Patient awake    Reviewed: Allergy & Precautions, H&P , NPO status , Patient's Chart, lab work & pertinent test results, reviewed documented beta blocker date and time   History of Anesthesia Complications (+) PONV  Airway Mallampati: I TM Distance: >3 FB Neck ROM: Full    Dental  (+) Dental Advisory Given and Teeth Intact   Pulmonary neg pulmonary ROS,  breath sounds clear to auscultation  Pulmonary exam normal       Cardiovascular negative cardio ROS  + Valvular Problems/Murmurs Rhythm:Regular Rate:Normal     Neuro/Psych PSYCHIATRIC DISORDERS Anxiety negative neurological ROS     GI/Hepatic Neg liver ROS, GERD-  Medicated and Controlled,  Endo/Other  negative endocrine ROS  Renal/GU negative Renal ROS     Musculoskeletal negative musculoskeletal ROS (+)   Abdominal (+) - obese,   Peds  Hematology negative hematology ROS (+)   Anesthesia Other Findings   Reproductive/Obstetrics                       Anesthesia Physical Anesthesia Plan  ASA: II  Anesthesia Plan: General   Post-op Pain Management:    Induction: Intravenous  Airway Management Planned: Oral ETT  Additional Equipment:   Intra-op Plan:   Post-operative Plan: Extubation in OR  Informed Consent: I have reviewed the patients History and Physical, chart, labs and discussed the procedure including the risks, benefits and alternatives for the proposed anesthesia with the patient or authorized representative who has indicated his/her understanding and acceptance.   Dental advisory given  Plan Discussed with: CRNA  Anesthesia Plan Comments:         Anesthesia Quick Evaluation

## 2012-11-27 NOTE — Transfer of Care (Signed)
Immediate Anesthesia Transfer of Care Note  Patient: Frances Ortiz  Procedure(s) Performed: Procedure(s) (LRB) with comments: LAPAROSCOPIC CHOLECYSTECTOMY WITH INTRAOPERATIVE CHOLANGIOGRAM (N/A)  Patient Location: PACU  Anesthesia Type:General  Level of Consciousness: awake, alert , oriented and patient cooperative  Airway & Oxygen Therapy: Patient Spontanous Breathing and Patient connected to face mask oxygen  Post-op Assessment: Report given to PACU RN and Post -op Vital signs reviewed and stable  Post vital signs: Reviewed and stable  Complications: No apparent anesthesia complications

## 2012-11-27 NOTE — OR Nursing (Signed)
Placed on PACU hold at 11:55.  Oralia Manis, RN

## 2012-11-27 NOTE — Anesthesia Postprocedure Evaluation (Signed)
Anesthesia Post Note  Patient: Frances Ortiz  Procedure(s) Performed: Procedure(s) (LRB): LAPAROSCOPIC CHOLECYSTECTOMY WITH INTRAOPERATIVE CHOLANGIOGRAM (N/A)  Anesthesia type: General  Patient location: PACU  Post pain: Pain level controlled  Post assessment: Post-op Vital signs reviewed  Last Vitals: BP 116/56  Pulse 97  Temp 36.2 C (Oral)  Resp 19  SpO2 96%  Post vital signs: Reviewed  Level of consciousness: sedated  Complications: No apparent anesthesia complications

## 2012-11-27 NOTE — Interval H&P Note (Signed)
History and Physical Interval Note:  11/27/2012 9:26 AM  Frances Ortiz  has presented today for surgery, with the diagnosis of cholelithiasis  The various methods of treatment have been discussed with the patient and family. After consideration of risks, benefits and other options for treatment, the patient has consented to  Procedure(s) (LRB) with comments: LAPAROSCOPIC CHOLECYSTECTOMY WITH INTRAOPERATIVE CHOLANGIOGRAM (N/A) - laparoscopic cholecystectomy with intraoperative cholangiogram as a surgical intervention .  The patient's history has been reviewed, patient examined, no change in status, stable for surgery.  I have reviewed the patient's chart and labs.  Questions were answered to the patient's satisfaction.     [name],[name]

## 2012-12-01 ENCOUNTER — Encounter (HOSPITAL_COMMUNITY): Payer: Self-pay | Admitting: General Surgery

## 2012-12-15 ENCOUNTER — Encounter (INDEPENDENT_AMBULATORY_CARE_PROVIDER_SITE_OTHER): Payer: Self-pay | Admitting: General Surgery

## 2012-12-15 ENCOUNTER — Ambulatory Visit (INDEPENDENT_AMBULATORY_CARE_PROVIDER_SITE_OTHER): Payer: Medicare Other | Admitting: General Surgery

## 2012-12-15 VITALS — BP 126/80 | HR 76 | Temp 97.8°F | Resp 16 | Ht 59.0 in | Wt 123.0 lb

## 2012-12-15 DIAGNOSIS — K8020 Calculus of gallbladder without cholecystitis without obstruction: Secondary | ICD-10-CM

## 2012-12-15 NOTE — Patient Instructions (Signed)
May return to all normal activities. No heavy lifting for a couple more weeks

## 2012-12-15 NOTE — Progress Notes (Signed)
Subjective:     [name], female   DOB: [birth year], 79 y.o.   MRN: [id number]  HPI The patient is a 79 year old white female who is about 2 weeks status post laparoscopic cholecystectomy. She denies any pain. Her appetite is improving. Her bowels are altering between diarrhea and constipation. Otherwise she feels great and has no complaints.  Review of Systems     Objective:   Physical Exam On exam her abdomen is soft and nontender. Her incisions are all healing nicely with no sign of infection.    Assessment:     The patient is 2 weeks status post laparoscopic cholecystectomy     Plan:     At this point I would like her to refrain from any heavy lifting for a couple more weeks but otherwise she can return to normal activities. We will plan to see her back on a when necessary basis.

## 2013-06-02 ENCOUNTER — Other Ambulatory Visit: Payer: Self-pay | Admitting: Dermatology

## 2014-02-15 ENCOUNTER — Other Ambulatory Visit: Payer: Self-pay

## 2014-02-15 DIAGNOSIS — Z1231 Encounter for screening mammogram for malignant neoplasm of breast: Secondary | ICD-10-CM

## 2014-03-05 ENCOUNTER — Ambulatory Visit
Admission: RE | Admit: 2014-03-05 | Discharge: 2014-03-05 | Disposition: A | Discharge status: Home / Self Care | Payer: Medicare Other | Source: Ambulatory Visit

## 2014-03-05 ENCOUNTER — Encounter (INDEPENDENT_AMBULATORY_CARE_PROVIDER_SITE_OTHER): Payer: Self-pay

## 2014-03-05 DIAGNOSIS — Z1231 Encounter for screening mammogram for malignant neoplasm of breast: Secondary | ICD-10-CM

## 2014-05-26 ENCOUNTER — Other Ambulatory Visit: Payer: Self-pay | Admitting: Dermatology

## 2014-12-29 ENCOUNTER — Other Ambulatory Visit: Payer: Self-pay | Admitting: Dermatology

## 2015-03-16 ENCOUNTER — Other Ambulatory Visit: Payer: Self-pay

## 2015-03-16 DIAGNOSIS — Z1231 Encounter for screening mammogram for malignant neoplasm of breast: Secondary | ICD-10-CM

## 2015-04-18 ENCOUNTER — Ambulatory Visit
Admission: RE | Admit: 2015-04-18 | Discharge: 2015-04-18 | Disposition: A | Discharge status: Home / Self Care | Payer: Medicare Other | Source: Ambulatory Visit

## 2015-04-18 DIAGNOSIS — Z1231 Encounter for screening mammogram for malignant neoplasm of breast: Secondary | ICD-10-CM

## 2015-12-29 DIAGNOSIS — L821 Other seborrheic keratosis: Secondary | ICD-10-CM | POA: Diagnosis not present

## 2015-12-29 DIAGNOSIS — L812 Freckles: Secondary | ICD-10-CM | POA: Diagnosis not present

## 2015-12-29 DIAGNOSIS — D1801 Hemangioma of skin and subcutaneous tissue: Secondary | ICD-10-CM | POA: Diagnosis not present

## 2015-12-29 DIAGNOSIS — L570 Actinic keratosis: Secondary | ICD-10-CM | POA: Diagnosis not present

## 2015-12-29 DIAGNOSIS — Z85820 Personal history of malignant melanoma of skin: Secondary | ICD-10-CM | POA: Diagnosis not present

## 2016-03-01 DIAGNOSIS — C44622 Squamous cell carcinoma of skin of right upper limb, including shoulder: Secondary | ICD-10-CM | POA: Diagnosis not present

## 2016-03-01 DIAGNOSIS — L570 Actinic keratosis: Secondary | ICD-10-CM | POA: Diagnosis not present

## 2016-04-12 DIAGNOSIS — Z85828 Personal history of other malignant neoplasm of skin: Secondary | ICD-10-CM | POA: Diagnosis not present

## 2016-04-20 DIAGNOSIS — C434 Malignant melanoma of scalp and neck: Secondary | ICD-10-CM | POA: Diagnosis not present

## 2016-04-20 DIAGNOSIS — K5730 Diverticulosis of large intestine without perforation or abscess without bleeding: Secondary | ICD-10-CM | POA: Diagnosis not present

## 2016-04-20 DIAGNOSIS — K862 Cyst of pancreas: Secondary | ICD-10-CM | POA: Diagnosis not present

## 2016-04-20 DIAGNOSIS — R918 Other nonspecific abnormal finding of lung field: Secondary | ICD-10-CM | POA: Diagnosis not present

## 2016-04-20 DIAGNOSIS — N200 Calculus of kidney: Secondary | ICD-10-CM | POA: Diagnosis not present

## 2016-06-06 ENCOUNTER — Other Ambulatory Visit: Payer: Self-pay | Admitting: Family Medicine

## 2016-06-06 DIAGNOSIS — Z1231 Encounter for screening mammogram for malignant neoplasm of breast: Secondary | ICD-10-CM

## 2016-06-19 ENCOUNTER — Ambulatory Visit
Admission: RE | Admit: 2016-06-19 | Discharge: 2016-06-19 | Disposition: A | Discharge status: Home / Self Care | Payer: Medicare Other | Source: Ambulatory Visit | Attending: Family Medicine | Admitting: Family Medicine

## 2016-06-19 DIAGNOSIS — Z1231 Encounter for screening mammogram for malignant neoplasm of breast: Secondary | ICD-10-CM

## 2016-07-17 DIAGNOSIS — Z85820 Personal history of malignant melanoma of skin: Secondary | ICD-10-CM | POA: Diagnosis not present

## 2016-07-17 DIAGNOSIS — L820 Inflamed seborrheic keratosis: Secondary | ICD-10-CM | POA: Diagnosis not present

## 2016-07-17 DIAGNOSIS — D1801 Hemangioma of skin and subcutaneous tissue: Secondary | ICD-10-CM | POA: Diagnosis not present

## 2016-07-17 DIAGNOSIS — L814 Other melanin hyperpigmentation: Secondary | ICD-10-CM | POA: Diagnosis not present

## 2016-07-17 DIAGNOSIS — D225 Melanocytic nevi of trunk: Secondary | ICD-10-CM | POA: Diagnosis not present

## 2016-07-17 DIAGNOSIS — L821 Other seborrheic keratosis: Secondary | ICD-10-CM | POA: Diagnosis not present

## 2016-07-17 DIAGNOSIS — Z85828 Personal history of other malignant neoplasm of skin: Secondary | ICD-10-CM | POA: Diagnosis not present

## 2016-09-06 DIAGNOSIS — M810 Age-related osteoporosis without current pathological fracture: Secondary | ICD-10-CM | POA: Diagnosis not present

## 2016-09-06 DIAGNOSIS — K219 Gastro-esophageal reflux disease without esophagitis: Secondary | ICD-10-CM | POA: Diagnosis not present

## 2016-09-06 DIAGNOSIS — Z0000 Encounter for general adult medical examination without abnormal findings: Secondary | ICD-10-CM | POA: Diagnosis not present

## 2016-09-06 DIAGNOSIS — Z23 Encounter for immunization: Secondary | ICD-10-CM | POA: Diagnosis not present

## 2016-09-06 DIAGNOSIS — F322 Major depressive disorder, single episode, severe without psychotic features: Secondary | ICD-10-CM | POA: Diagnosis not present

## 2016-10-24 DIAGNOSIS — M85851 Other specified disorders of bone density and structure, right thigh: Secondary | ICD-10-CM | POA: Diagnosis not present

## 2016-10-24 DIAGNOSIS — M810 Age-related osteoporosis without current pathological fracture: Secondary | ICD-10-CM | POA: Diagnosis not present

## 2016-11-02 DIAGNOSIS — Z85820 Personal history of malignant melanoma of skin: Secondary | ICD-10-CM | POA: Diagnosis not present

## 2016-11-02 DIAGNOSIS — C434 Malignant melanoma of scalp and neck: Secondary | ICD-10-CM | POA: Diagnosis not present

## 2016-11-02 DIAGNOSIS — Z08 Encounter for follow-up examination after completed treatment for malignant neoplasm: Secondary | ICD-10-CM | POA: Diagnosis not present

## 2016-11-02 DIAGNOSIS — Z9889 Other specified postprocedural states: Secondary | ICD-10-CM | POA: Diagnosis not present

## 2017-01-15 DIAGNOSIS — L814 Other melanin hyperpigmentation: Secondary | ICD-10-CM | POA: Diagnosis not present

## 2017-01-15 DIAGNOSIS — L820 Inflamed seborrheic keratosis: Secondary | ICD-10-CM | POA: Diagnosis not present

## 2017-01-15 DIAGNOSIS — L821 Other seborrheic keratosis: Secondary | ICD-10-CM | POA: Diagnosis not present

## 2017-01-15 DIAGNOSIS — D225 Melanocytic nevi of trunk: Secondary | ICD-10-CM | POA: Diagnosis not present

## 2017-05-31 DIAGNOSIS — C434 Malignant melanoma of scalp and neck: Secondary | ICD-10-CM | POA: Diagnosis not present

## 2017-05-31 DIAGNOSIS — E042 Nontoxic multinodular goiter: Secondary | ICD-10-CM | POA: Diagnosis not present

## 2017-05-31 DIAGNOSIS — K7689 Other specified diseases of liver: Secondary | ICD-10-CM | POA: Diagnosis not present

## 2017-06-13 ENCOUNTER — Other Ambulatory Visit: Payer: Self-pay | Admitting: Family Medicine

## 2017-06-13 DIAGNOSIS — Z1231 Encounter for screening mammogram for malignant neoplasm of breast: Secondary | ICD-10-CM

## 2017-07-02 ENCOUNTER — Ambulatory Visit
Admission: RE | Admit: 2017-07-02 | Discharge: 2017-07-02 | Disposition: A | Discharge status: Home / Self Care | Payer: Medicare Other | Source: Ambulatory Visit | Attending: Family Medicine | Admitting: Family Medicine

## 2017-07-02 DIAGNOSIS — Z1231 Encounter for screening mammogram for malignant neoplasm of breast: Secondary | ICD-10-CM

## 2017-08-05 DIAGNOSIS — L570 Actinic keratosis: Secondary | ICD-10-CM | POA: Diagnosis not present

## 2017-08-05 DIAGNOSIS — D1801 Hemangioma of skin and subcutaneous tissue: Secondary | ICD-10-CM | POA: Diagnosis not present

## 2017-08-05 DIAGNOSIS — L918 Other hypertrophic disorders of the skin: Secondary | ICD-10-CM | POA: Diagnosis not present

## 2017-08-05 DIAGNOSIS — D2339 Other benign neoplasm of skin of other parts of face: Secondary | ICD-10-CM | POA: Diagnosis not present

## 2017-08-21 DIAGNOSIS — K625 Hemorrhage of anus and rectum: Secondary | ICD-10-CM | POA: Diagnosis not present

## 2017-08-22 HISTORY — PX: COLONOSCOPY: SHX174

## 2017-08-26 DIAGNOSIS — K922 Gastrointestinal hemorrhage, unspecified: Secondary | ICD-10-CM | POA: Diagnosis not present

## 2017-09-16 DIAGNOSIS — C180 Malignant neoplasm of cecum: Secondary | ICD-10-CM | POA: Diagnosis not present

## 2017-09-16 DIAGNOSIS — K317 Polyp of stomach and duodenum: Secondary | ICD-10-CM | POA: Diagnosis not present

## 2017-09-16 DIAGNOSIS — D490 Neoplasm of unspecified behavior of digestive system: Secondary | ICD-10-CM | POA: Diagnosis not present

## 2017-09-16 DIAGNOSIS — K625 Hemorrhage of anus and rectum: Secondary | ICD-10-CM | POA: Diagnosis not present

## 2017-09-19 DIAGNOSIS — C180 Malignant neoplasm of cecum: Secondary | ICD-10-CM | POA: Diagnosis not present

## 2017-09-19 DIAGNOSIS — K317 Polyp of stomach and duodenum: Secondary | ICD-10-CM | POA: Diagnosis not present

## 2017-09-23 DIAGNOSIS — Z23 Encounter for immunization: Secondary | ICD-10-CM | POA: Diagnosis not present

## 2017-09-26 ENCOUNTER — Ambulatory Visit: Payer: Self-pay | Admitting: General Surgery

## 2017-09-26 DIAGNOSIS — C180 Malignant neoplasm of cecum: Secondary | ICD-10-CM | POA: Diagnosis not present

## 2017-10-01 ENCOUNTER — Other Ambulatory Visit: Payer: Self-pay | Admitting: General Surgery

## 2017-10-01 DIAGNOSIS — C180 Malignant neoplasm of cecum: Secondary | ICD-10-CM

## 2017-10-02 ENCOUNTER — Ambulatory Visit
Admission: RE | Admit: 2017-10-02 | Discharge: 2017-10-02 | Disposition: A | Discharge status: Home / Self Care | Payer: Medicare Other | Source: Ambulatory Visit | Attending: General Surgery | Admitting: General Surgery

## 2017-10-02 DIAGNOSIS — C180 Malignant neoplasm of cecum: Secondary | ICD-10-CM

## 2017-10-02 MED ORDER — IOPAMIDOL (ISOVUE-300) INJECTION 61%
100.0000 mL | Freq: Once | INTRAVENOUS | Status: AC | PRN
Start: 2017-10-02 — End: 2017-10-02
  Administered 2017-10-02: 100 mL via INTRAVENOUS

## 2017-10-03 ENCOUNTER — Other Ambulatory Visit: Payer: Self-pay

## 2017-10-03 ENCOUNTER — Encounter (HOSPITAL_COMMUNITY): Payer: Self-pay

## 2017-10-03 ENCOUNTER — Encounter (HOSPITAL_COMMUNITY)
Admission: RE | Admit: 2017-10-03 | Discharge: 2017-10-03 | Disposition: A | Discharge status: Home / Self Care | Payer: Medicare Other | Source: Ambulatory Visit | Attending: General Surgery | Admitting: General Surgery

## 2017-10-03 DIAGNOSIS — R011 Cardiac murmur, unspecified: Secondary | ICD-10-CM | POA: Diagnosis not present

## 2017-10-03 DIAGNOSIS — K8020 Calculus of gallbladder without cholecystitis without obstruction: Secondary | ICD-10-CM | POA: Insufficient documentation

## 2017-10-03 DIAGNOSIS — Z85820 Personal history of malignant melanoma of skin: Secondary | ICD-10-CM | POA: Diagnosis not present

## 2017-10-03 DIAGNOSIS — Z01812 Encounter for preprocedural laboratory examination: Secondary | ICD-10-CM | POA: Insufficient documentation

## 2017-10-03 DIAGNOSIS — K219 Gastro-esophageal reflux disease without esophagitis: Secondary | ICD-10-CM | POA: Insufficient documentation

## 2017-10-03 DIAGNOSIS — E785 Hyperlipidemia, unspecified: Secondary | ICD-10-CM | POA: Insufficient documentation

## 2017-10-03 DIAGNOSIS — Z01810 Encounter for preprocedural cardiovascular examination: Secondary | ICD-10-CM | POA: Insufficient documentation

## 2017-10-03 DIAGNOSIS — M810 Age-related osteoporosis without current pathological fracture: Secondary | ICD-10-CM | POA: Diagnosis not present

## 2017-10-03 DIAGNOSIS — F419 Anxiety disorder, unspecified: Secondary | ICD-10-CM | POA: Insufficient documentation

## 2017-10-03 LAB — CBC WITH DIFFERENTIAL/PLATELET
BASOS ABS: 0.1 10*3/uL (ref 0.0–0.1)
Basophils Relative: 1 %
EOS ABS: 0.5 10*3/uL (ref 0.0–0.7)
EOS PCT: 8 %
HCT: 35 % — ABNORMAL LOW (ref 36.0–46.0)
Hemoglobin: 10.9 g/dL — ABNORMAL LOW (ref 12.0–15.0)
LYMPHS PCT: 27 %
Lymphs Abs: 1.8 10*3/uL (ref 0.7–4.0)
MCH: 27.3 pg (ref 26.0–34.0)
MCHC: 31.1 g/dL (ref 30.0–36.0)
MCV: 87.7 fL (ref 78.0–100.0)
Monocytes Absolute: 0.6 10*3/uL (ref 0.1–1.0)
Monocytes Relative: 9 %
Neutro Abs: 3.9 10*3/uL (ref 1.7–7.7)
Neutrophils Relative %: 55 %
PLATELETS: 403 10*3/uL — AB (ref 150–400)
RBC: 3.99 MIL/uL (ref 3.87–5.11)
RDW: 14.3 % (ref 11.5–15.5)
WBC: 6.9 10*3/uL (ref 4.0–10.5)

## 2017-10-03 LAB — COMPREHENSIVE METABOLIC PANEL
ALT: 22 U/L (ref 14–54)
AST: 30 U/L (ref 15–41)
Albumin: 3.9 g/dL (ref 3.5–5.0)
Alkaline Phosphatase: 81 U/L (ref 38–126)
Anion gap: 7 (ref 5–15)
BUN: 14 mg/dL (ref 6–20)
CHLORIDE: 108 mmol/L (ref 101–111)
CO2: 24 mmol/L (ref 22–32)
Calcium: 8.8 mg/dL — ABNORMAL LOW (ref 8.9–10.3)
Creatinine, Ser: 0.68 mg/dL (ref 0.44–1.00)
Glucose, Bld: 91 mg/dL (ref 65–99)
POTASSIUM: 3.8 mmol/L (ref 3.5–5.1)
SODIUM: 139 mmol/L (ref 135–145)
Total Bilirubin: 0.4 mg/dL (ref 0.3–1.2)
Total Protein: 7.2 g/dL (ref 6.5–8.1)

## 2017-10-03 LAB — TYPE AND SCREEN
ABO/RH(D): O POS
ANTIBODY SCREEN: NEGATIVE

## 2017-10-03 LAB — ABO/RH: ABO/RH(D): O POS

## 2017-10-03 NOTE — Progress Notes (Addendum)
PCP is Dr. Donnie Coffin Denies ever seeing a cardiologist.  Denies chest pain, fever, or cough. Denies ever having a card cath or stress test.  request sent for echo report -sent to Dr. Alroy Dust

## 2017-10-03 NOTE — Pre-Procedure Instructions (Signed)
JORGIA MANTHEI  10/03/2017      [address] [place], [place] [address] Phone: [phone] Fax: [phone]    Your procedure is scheduled on Dec17  Report to [facility] Admitting at 1000 A.M.  Call this number if you have problems the morning of surgery:  [phone]   Remember:  Do not eat food or drink liquids after midnight.  Take these medicines the morning of surgery with A SIP OF WATER Pantoprazole (Protonix), Sertraline (Zoloft)  Stop taking aspirin, BC's, Goody's, herbal medications, Fish Oil, Ibuprofen, Advil, Motrin, Aleve, Vitamins   Do not wear jewelry, make-up or nail polish.  Do not wear lotions, powders, or perfumes, or deodorant.  Do not shave 48 hours prior to surgery.  Men may shave face and neck.  Do not bring valuables to the hospital.  [facility] is not responsible for any belongings or valuables.  Contacts, dentures or bridgework may not be worn into surgery.  Leave your suitcase in the car.  After surgery it may be brought to your room.  For patients admitted to the hospital, discharge time will be determined by your treatment team.  Patients discharged the day of surgery will not be allowed to drive home.   Special instructions:  [facility] - Preparing for Surgery  Before surgery, you can play an important role.  Because skin is not sterile, your skin needs to be as free of germs as possible.  You can reduce the number of germs on you skin by washing with CHG (chlorahexidine gluconate) soap before surgery.  CHG is an antiseptic cleaner which kills germs and bonds with the skin to continue killing germs even after washing.  Please DO NOT use if you have an allergy to CHG or antibacterial soaps.  If your skin becomes reddened/irritated stop using the CHG and inform your nurse when you arrive at Short Stay.  Do not shave (including legs and underarms) for at  least 48 hours prior to the first CHG shower.  You may shave your face.  Please follow these instructions carefully:   1.  Shower with CHG Soap the night before surgery and the   morning of Surgery.  2.  If you choose to wash your hair, wash your hair first as usual with your  normal shampoo.  3.  After you shampoo, rinse your hair and body thoroughly to remove the Shampoo.  4.  Use CHG as you would any other liquid soap.  You can apply chg directly  to the skin and wash gently with scrungie or a clean washcloth.  5.  Apply the CHG Soap to your body ONLY FROM THE NECK DOWN.    Do not use on open wounds or open sores.  Avoid contact with your eyes, ears, mouth and genitals (private parts).  Wash genitals (private parts) with your normal soap.  6.  Wash thoroughly, paying special attention to the area where your surgery will be performed.  7.  Thoroughly rinse your body with warm water from the neck down.  8.  DO NOT shower/wash with your normal soap after using and rinsing off the CHG Soap.  9.  Pat yourself dry with a clean towel.            10.  Wear clean pajamas.            11.  Place clean sheets on  your bed the night of your first shower and do not sleep with pets.  Day of Surgery  Do not apply any lotions/deoderants the morning of surgery.  Please wear clean clothes to the hospital/surgery center.     Please read over the following fact sheets that you were given. Pain Booklet, Coughing and Deep Breathing and Surgical Site Infection Prevention

## 2017-10-04 LAB — CEA: CEA1: 1.4 ng/mL (ref 0.0–4.7)

## 2017-10-07 ENCOUNTER — Inpatient Hospital Stay (HOSPITAL_COMMUNITY)
Admission: RE | Admit: 2017-10-07 | Discharge: 2017-10-12 | DRG: 331 | Disposition: A | Discharge status: Home / Self Care | Payer: Medicare Other | Source: Ambulatory Visit | Attending: General Surgery | Admitting: General Surgery

## 2017-10-07 ENCOUNTER — Encounter (HOSPITAL_COMMUNITY): Payer: Self-pay | Admitting: Certified Registered"

## 2017-10-07 ENCOUNTER — Inpatient Hospital Stay (HOSPITAL_COMMUNITY): Payer: Medicare Other | Admitting: Certified Registered"

## 2017-10-07 ENCOUNTER — Other Ambulatory Visit: Payer: Self-pay

## 2017-10-07 ENCOUNTER — Encounter (HOSPITAL_COMMUNITY)
Admission: RE | Disposition: A | Discharge status: Home / Self Care | Payer: Self-pay | Source: Ambulatory Visit | Attending: General Surgery

## 2017-10-07 DIAGNOSIS — K8020 Calculus of gallbladder without cholecystitis without obstruction: Secondary | ICD-10-CM | POA: Diagnosis not present

## 2017-10-07 DIAGNOSIS — Z85820 Personal history of malignant melanoma of skin: Secondary | ICD-10-CM | POA: Diagnosis not present

## 2017-10-07 DIAGNOSIS — E7800 Pure hypercholesterolemia, unspecified: Secondary | ICD-10-CM | POA: Diagnosis present

## 2017-10-07 DIAGNOSIS — Z86711 Personal history of pulmonary embolism: Secondary | ICD-10-CM | POA: Diagnosis not present

## 2017-10-07 DIAGNOSIS — Z86010 Personal history of colonic polyps: Secondary | ICD-10-CM | POA: Diagnosis not present

## 2017-10-07 DIAGNOSIS — Z888 Allergy status to other drugs, medicaments and biological substances status: Secondary | ICD-10-CM

## 2017-10-07 DIAGNOSIS — F329 Major depressive disorder, single episode, unspecified: Secondary | ICD-10-CM | POA: Diagnosis not present

## 2017-10-07 DIAGNOSIS — C180 Malignant neoplasm of cecum: Secondary | ICD-10-CM | POA: Diagnosis not present

## 2017-10-07 DIAGNOSIS — Z79899 Other long term (current) drug therapy: Secondary | ICD-10-CM

## 2017-10-07 DIAGNOSIS — F419 Anxiety disorder, unspecified: Secondary | ICD-10-CM | POA: Diagnosis present

## 2017-10-07 HISTORY — PX: COLECTOMY: SHX59

## 2017-10-07 HISTORY — PX: LAPAROSCOPIC RIGHT COLECTOMY: SHX5925

## 2017-10-07 SURGERY — COLECTOMY, RIGHT, LAPAROSCOPIC
Anesthesia: General | Laterality: Right

## 2017-10-07 MED ORDER — LIDOCAINE 2% (20 MG/ML) 5 ML SYRINGE
INTRAMUSCULAR | Status: DC | PRN
  Administered 2017-10-07: 60 mg via INTRAVENOUS

## 2017-10-07 MED ORDER — BUPIVACAINE-EPINEPHRINE (PF) 0.25% -1:200000 IJ SOLN
INTRAMUSCULAR | Status: AC
  Filled 2017-10-07: qty 30

## 2017-10-07 MED ORDER — CHLORHEXIDINE GLUCONATE CLOTH 2 % EX PADS: 6.0000 | MEDICATED_PAD | Freq: Once | CUTANEOUS | Status: DC

## 2017-10-07 MED ORDER — ROCURONIUM BROMIDE 100 MG/10ML IV SOLN
INTRAVENOUS | Status: DC | PRN
  Administered 2017-10-07: 50 mg via INTRAVENOUS
  Administered 2017-10-07: 10 mg via INTRAVENOUS

## 2017-10-07 MED ORDER — PROPOFOL 10 MG/ML IV BOLUS
INTRAVENOUS | Status: AC
  Filled 2017-10-07: qty 20

## 2017-10-07 MED ORDER — LACTATED RINGERS IV SOLN
INTRAVENOUS | Status: DC | PRN
  Administered 2017-10-07 (×2): via INTRAVENOUS

## 2017-10-07 MED ORDER — LACTATED RINGERS IV SOLN: INTRAVENOUS | Status: DC

## 2017-10-07 MED ORDER — CEFOTETAN DISODIUM-DEXTROSE 2-2.08 GM-%(50ML) IV SOLR
INTRAVENOUS | Status: AC
  Filled 2017-10-07: qty 50

## 2017-10-07 MED ORDER — PHENYLEPHRINE 40 MCG/ML (10ML) SYRINGE FOR IV PUSH (FOR BLOOD PRESSURE SUPPORT)
PREFILLED_SYRINGE | INTRAVENOUS | Status: DC | PRN
  Administered 2017-10-07 (×3): 80 ug via INTRAVENOUS

## 2017-10-07 MED ORDER — EPHEDRINE SULFATE-NACL 50-0.9 MG/10ML-% IV SOSY
PREFILLED_SYRINGE | INTRAVENOUS | Status: DC | PRN
  Administered 2017-10-07 (×2): 5 mg via INTRAVENOUS

## 2017-10-07 MED ORDER — PROMETHAZINE HCL 25 MG/ML IJ SOLN
INTRAMUSCULAR | Status: AC
  Filled 2017-10-07: qty 1

## 2017-10-07 MED ORDER — KCL IN DEXTROSE-NACL 20-5-0.9 MEQ/L-%-% IV SOLN
INTRAVENOUS | Status: DC
  Administered 2017-10-07 – 2017-10-08 (×3): via INTRAVENOUS
  Filled 2017-10-07 (×5): qty 1000

## 2017-10-07 MED ORDER — METHOCARBAMOL 500 MG PO TABS
500.0000 mg | ORAL_TABLET | Freq: Four times a day (QID) | ORAL | Status: DC | PRN
  Administered 2017-10-08: 500 mg via ORAL
  Filled 2017-10-07 (×2): qty 1

## 2017-10-07 MED ORDER — CEFOTETAN DISODIUM-DEXTROSE 2-2.08 GM-%(50ML) IV SOLR
2.0000 g | INTRAVENOUS | Status: AC
  Administered 2017-10-07: 2 g via INTRAVENOUS

## 2017-10-07 MED ORDER — HEPARIN SODIUM (PORCINE) 5000 UNIT/ML IJ SOLN
5000.0000 [IU] | Freq: Three times a day (TID) | INTRAMUSCULAR | Status: DC
  Administered 2017-10-08 – 2017-10-12 (×13): 5000 [IU] via SUBCUTANEOUS
  Filled 2017-10-07 (×12): qty 1

## 2017-10-07 MED ORDER — CELECOXIB 200 MG PO CAPS
ORAL_CAPSULE | ORAL | Status: AC
  Filled 2017-10-07: qty 1

## 2017-10-07 MED ORDER — SUCCINYLCHOLINE CHLORIDE 200 MG/10ML IV SOSY
PREFILLED_SYRINGE | INTRAVENOUS | Status: AC
  Filled 2017-10-07: qty 10

## 2017-10-07 MED ORDER — EPHEDRINE 5 MG/ML INJ
INTRAVENOUS | Status: AC
  Filled 2017-10-07: qty 10

## 2017-10-07 MED ORDER — FENTANYL CITRATE (PF) 100 MCG/2ML IJ SOLN: 25.0000 ug | INTRAMUSCULAR | Status: DC | PRN

## 2017-10-07 MED ORDER — ALVIMOPAN 12 MG PO CAPS
ORAL_CAPSULE | ORAL | Status: AC
  Filled 2017-10-07: qty 1

## 2017-10-07 MED ORDER — SUGAMMADEX SODIUM 200 MG/2ML IV SOLN
INTRAVENOUS | Status: DC | PRN
  Administered 2017-10-07: 200 mg via INTRAVENOUS

## 2017-10-07 MED ORDER — DEXAMETHASONE SODIUM PHOSPHATE 10 MG/ML IJ SOLN
INTRAMUSCULAR | Status: AC
  Filled 2017-10-07: qty 1

## 2017-10-07 MED ORDER — PROMETHAZINE HCL 25 MG/ML IJ SOLN
6.2500 mg | INTRAMUSCULAR | Status: AC | PRN
  Administered 2017-10-07 (×2): 6.25 mg via INTRAVENOUS

## 2017-10-07 MED ORDER — PHENYLEPHRINE 40 MCG/ML (10ML) SYRINGE FOR IV PUSH (FOR BLOOD PRESSURE SUPPORT)
PREFILLED_SYRINGE | INTRAVENOUS | Status: AC
  Filled 2017-10-07: qty 10

## 2017-10-07 MED ORDER — PANTOPRAZOLE SODIUM 40 MG IV SOLR
40.0000 mg | Freq: Every day | INTRAVENOUS | Status: DC
  Administered 2017-10-07 – 2017-10-10 (×3): 40 mg via INTRAVENOUS
  Filled 2017-10-07 (×4): qty 40

## 2017-10-07 MED ORDER — BUPIVACAINE-EPINEPHRINE 0.25% -1:200000 IJ SOLN
INTRAMUSCULAR | Status: DC | PRN
  Administered 2017-10-07: 13 mL

## 2017-10-07 MED ORDER — GABAPENTIN 300 MG PO CAPS
300.0000 mg | ORAL_CAPSULE | ORAL | Status: AC
  Administered 2017-10-07: 300 mg via ORAL

## 2017-10-07 MED ORDER — GABAPENTIN 300 MG PO CAPS
ORAL_CAPSULE | ORAL | Status: AC
  Filled 2017-10-07: qty 1

## 2017-10-07 MED ORDER — ALVIMOPAN 12 MG PO CAPS
12.0000 mg | ORAL_CAPSULE | Freq: Two times a day (BID) | ORAL | Status: DC
  Administered 2017-10-08 – 2017-10-10 (×5): 12 mg via ORAL
  Filled 2017-10-07 (×6): qty 1

## 2017-10-07 MED ORDER — PHENYLEPHRINE HCL 10 MG/ML IJ SOLN
INTRAVENOUS | Status: DC | PRN
  Administered 2017-10-07: 60 ug/min via INTRAVENOUS

## 2017-10-07 MED ORDER — ONDANSETRON HCL 4 MG/2ML IJ SOLN
INTRAMUSCULAR | Status: AC
  Filled 2017-10-07: qty 2

## 2017-10-07 MED ORDER — CELECOXIB 200 MG PO CAPS
200.0000 mg | ORAL_CAPSULE | ORAL | Status: AC
  Administered 2017-10-07: 200 mg via ORAL

## 2017-10-07 MED ORDER — FENTANYL CITRATE (PF) 100 MCG/2ML IJ SOLN
INTRAMUSCULAR | Status: DC | PRN
  Administered 2017-10-07 (×2): 50 ug via INTRAVENOUS

## 2017-10-07 MED ORDER — ONDANSETRON HCL 4 MG/2ML IJ SOLN
INTRAMUSCULAR | Status: DC | PRN
  Administered 2017-10-07: 4 mg via INTRAVENOUS

## 2017-10-07 MED ORDER — ONDANSETRON 4 MG PO TBDP: 4.0000 mg | ORAL_TABLET | Freq: Four times a day (QID) | ORAL | Status: DC | PRN

## 2017-10-07 MED ORDER — SUGAMMADEX SODIUM 200 MG/2ML IV SOLN
INTRAVENOUS | Status: AC
  Filled 2017-10-07: qty 2

## 2017-10-07 MED ORDER — ALVIMOPAN 12 MG PO CAPS
12.0000 mg | ORAL_CAPSULE | ORAL | Status: AC
  Administered 2017-10-07: 12 mg via ORAL

## 2017-10-07 MED ORDER — ONDANSETRON HCL 4 MG/2ML IJ SOLN
4.0000 mg | Freq: Four times a day (QID) | INTRAMUSCULAR | Status: DC | PRN
  Administered 2017-10-07 – 2017-10-10 (×3): 4 mg via INTRAVENOUS
  Filled 2017-10-07 (×4): qty 2

## 2017-10-07 MED ORDER — 0.9 % SODIUM CHLORIDE (POUR BTL) OPTIME
TOPICAL | Status: DC | PRN
  Administered 2017-10-07 (×2): 1000 mL

## 2017-10-07 MED ORDER — OXYCODONE HCL 5 MG/5ML PO SOLN: 5.0000 mg | Freq: Once | ORAL | Status: DC | PRN

## 2017-10-07 MED ORDER — MORPHINE SULFATE (PF) 4 MG/ML IV SOLN
1.0000 mg | INTRAVENOUS | Status: DC | PRN
  Administered 2017-10-07: 4 mg via INTRAVENOUS
  Administered 2017-10-08: 2 mg via INTRAVENOUS
  Administered 2017-10-10 – 2017-10-11 (×2): 1 mg via INTRAVENOUS
  Filled 2017-10-07 (×4): qty 1

## 2017-10-07 MED ORDER — PROPOFOL 10 MG/ML IV BOLUS
INTRAVENOUS | Status: DC | PRN
  Administered 2017-10-07: 120 mg via INTRAVENOUS

## 2017-10-07 MED ORDER — MEPERIDINE HCL 25 MG/ML IJ SOLN: 6.2500 mg | INTRAMUSCULAR | Status: DC | PRN

## 2017-10-07 MED ORDER — ROCURONIUM BROMIDE 10 MG/ML (PF) SYRINGE
PREFILLED_SYRINGE | INTRAVENOUS | Status: AC
  Filled 2017-10-07: qty 5

## 2017-10-07 MED ORDER — OXYCODONE HCL 5 MG PO TABS: 5.0000 mg | ORAL_TABLET | Freq: Once | ORAL | Status: DC | PRN

## 2017-10-07 MED ORDER — FENTANYL CITRATE (PF) 250 MCG/5ML IJ SOLN
INTRAMUSCULAR | Status: AC
Start: 2017-10-07 — End: ?
  Filled 2017-10-07: qty 5

## 2017-10-07 SURGICAL SUPPLY — 66 items
APPLIER CLIP ROT 10 11.4 M/L (STAPLE)
APR CLP MED LRG 11.4X10 (STAPLE)
BLADE CLIPPER SURG (BLADE) IMPLANT
CANISTER SUCT 3000ML PPV (MISCELLANEOUS) ×2 IMPLANT
CELLS DAT CNTRL 66122 CELL SVR (MISCELLANEOUS) ×1 IMPLANT
CHLORAPREP W/TINT 26ML (MISCELLANEOUS) ×2 IMPLANT
CLIP APPLIE ROT 10 11.4 M/L (STAPLE) IMPLANT
COVER MAYO STAND STRL (DRAPES) ×2 IMPLANT
COVER SURGICAL LIGHT HANDLE (MISCELLANEOUS) ×4 IMPLANT
DRAPE HALF SHEET 40X57 (DRAPES) ×2 IMPLANT
DRAPE LAPAROSCOPIC ABDOMINAL (DRAPES) ×2 IMPLANT
DRAPE UTILITY XL STRL (DRAPES) ×10 IMPLANT
DRAPE WARM FLUID 44X44 (DRAPE) ×2 IMPLANT
DRSG OPSITE POSTOP 4X10 (GAUZE/BANDAGES/DRESSINGS) IMPLANT
DRSG OPSITE POSTOP 4X8 (GAUZE/BANDAGES/DRESSINGS) ×1 IMPLANT
DRSG TEGADERM 2-3/8X2-3/4 SM (GAUZE/BANDAGES/DRESSINGS) ×6 IMPLANT
ELECT CAUTERY BLADE 6.4 (BLADE) ×4 IMPLANT
ELECT REM PT RETURN 9FT ADLT (ELECTROSURGICAL) ×2
ELECTRODE REM PT RTRN 9FT ADLT (ELECTROSURGICAL) ×1 IMPLANT
GAUZE SPONGE 2X2 8PLY STRL LF (GAUZE/BANDAGES/DRESSINGS) IMPLANT
GLOVE BIO SURGEON STRL SZ7.5 (GLOVE) ×6 IMPLANT
GLOVE BIOGEL PI IND STRL 7.5 (GLOVE) ×1 IMPLANT
GLOVE BIOGEL PI INDICATOR 7.5 (GLOVE) ×1
GOWN STRL REUS W/ TWL LRG LVL3 (GOWN DISPOSABLE) ×8 IMPLANT
GOWN STRL REUS W/TWL LRG LVL3 (GOWN DISPOSABLE) ×16
KIT BASIN OR (CUSTOM PROCEDURE TRAY) ×2 IMPLANT
KIT ROOM TURNOVER OR (KITS) ×2 IMPLANT
LIGASURE IMPACT 36 18CM CVD LR (INSTRUMENTS) ×3 IMPLANT
NS IRRIG 1000ML POUR BTL (IV SOLUTION) ×4 IMPLANT
PAD ARMBOARD 7.5X6 YLW CONV (MISCELLANEOUS) ×4 IMPLANT
PENCIL BUTTON HOLSTER BLD 10FT (ELECTRODE) ×4 IMPLANT
RELOAD PROXIMATE 75MM BLUE (ENDOMECHANICALS) ×4 IMPLANT
RETRACTOR WND ALEXIS 18 MED (MISCELLANEOUS) IMPLANT
RTRCTR WOUND ALEXIS 18CM MED (MISCELLANEOUS) ×2
SCISSORS ENDO CVD 5DCS (MISCELLANEOUS) ×1 IMPLANT
SCISSORS LAP 5X35 DISP (ENDOMECHANICALS) ×2 IMPLANT
SET IRRIG TUBING LAPAROSCOPIC (IRRIGATION / IRRIGATOR) IMPLANT
SHEARS HARMONIC ACE PLUS 36CM (ENDOMECHANICALS) ×2 IMPLANT
SLEEVE ENDOPATH XCEL 5M (ENDOMECHANICALS) ×4 IMPLANT
SPECIMEN JAR LARGE (MISCELLANEOUS) ×2 IMPLANT
SPONGE GAUZE 2X2 STER 10/PKG (GAUZE/BANDAGES/DRESSINGS) ×1
STAPLER GUN LINEAR PROX 60 (STAPLE) ×1 IMPLANT
STAPLER PROXIMATE 75MM BLUE (STAPLE) ×1 IMPLANT
STAPLER VISISTAT 35W (STAPLE) ×2 IMPLANT
SUT PDS AB 1 CT  36 (SUTURE) ×2
SUT PDS AB 1 CT 36 (SUTURE) ×2 IMPLANT
SUT PDS AB 1 TP1 96 (SUTURE) ×2 IMPLANT
SUT SILK 2 0 (SUTURE) ×2
SUT SILK 2 0 SH CR/8 (SUTURE) ×2 IMPLANT
SUT SILK 2-0 18XBRD TIE 12 (SUTURE) ×1 IMPLANT
SUT SILK 3 0 (SUTURE) ×2
SUT SILK 3 0 SH CR/8 (SUTURE) ×2 IMPLANT
SUT SILK 3-0 18XBRD TIE 12 (SUTURE) ×1 IMPLANT
SYR BULB IRRIGATION 50ML (SYRINGE) ×4 IMPLANT
SYS LAPSCP GELPORT 120MM (MISCELLANEOUS)
SYSTEM LAPSCP GELPORT 120MM (MISCELLANEOUS) IMPLANT
TOWEL OR 17X26 10 PK STRL BLUE (TOWEL DISPOSABLE) ×4 IMPLANT
TRAY FOLEY W/METER SILVER 16FR (SET/KITS/TRAYS/PACK) ×2 IMPLANT
TRAY LAPAROSCOPIC MC (CUSTOM PROCEDURE TRAY) ×2 IMPLANT
TROCAR XCEL 12X100 BLDLESS (ENDOMECHANICALS) IMPLANT
TROCAR XCEL BLUNT TIP 100MML (ENDOMECHANICALS) IMPLANT
TROCAR XCEL NON-BLD 11X100MML (ENDOMECHANICALS) IMPLANT
TROCAR XCEL NON-BLD 5MMX100MML (ENDOMECHANICALS) ×2 IMPLANT
TUBE CONNECTING 12X1/4 (SUCTIONS) ×5 IMPLANT
TUBING INSUFFLATION (TUBING) ×2 IMPLANT
YANKAUER SUCT BULB TIP NO VENT (SUCTIONS) ×5 IMPLANT

## 2017-10-07 NOTE — Transfer of Care (Signed)
Immediate Anesthesia Transfer of Care Note  Patient: Frances Ortiz  Procedure(s) Performed: LAPAROSCOPIC RIGHT COLECTOMY ERAS PATHWAY (Right )  Patient Location: PACU  Anesthesia Type:General  Level of Consciousness: drowsy and patient cooperative  Airway & Oxygen Therapy: Patient Spontanous Breathing and Patient connected to nasal cannula oxygen  Post-op Assessment: Report given to RN and Post -op Vital signs reviewed and stable  Post vital signs: Reviewed and stable  Last Vitals:  Vitals:   10/07/17 1020 10/07/17 1514  BP: (!) 158/61   Pulse: (!) 107   Resp: 20   Temp: 36.6 C 36.6 C    Last Pain:  Vitals:   10/07/17 1514  TempSrc:   PainSc: Asleep      Patients Stated Pain Goal: 5 (63/01/60 1093)  Complications: No apparent anesthesia complications

## 2017-10-07 NOTE — Interval H&P Note (Signed)
History and Physical Interval Note:  10/07/2017 10:58 AM  Frances Ortiz  has presented today for surgery, with the diagnosis of cecal cancer  The various methods of treatment have been discussed with the patient and family. After consideration of risks, benefits and other options for treatment, the patient has consented to  Procedure(s): [place] (Right) as a surgical intervention .  The patient's history has been reviewed, patient examined, no change in status, stable for surgery.  I have reviewed the patient's chart and labs.  Questions were answered to the patient's satisfaction.     [name],PAUL S

## 2017-10-07 NOTE — Anesthesia Preprocedure Evaluation (Signed)
Anesthesia Evaluation  Patient identified by MRN, date of birth, ID band Patient awake    Reviewed: Allergy & Precautions, H&P , NPO status , Patient's Chart, lab work & pertinent test results, reviewed documented beta blocker date and time   History of Anesthesia Complications (+) PONV and history of anesthetic complications  Airway Mallampati: I  TM Distance: >3 FB Neck ROM: Full    Dental  (+) Dental Advisory Given, Teeth Intact   Pulmonary neg pulmonary ROS,    Pulmonary exam normal breath sounds clear to auscultation       Cardiovascular negative cardio ROS Normal cardiovascular exam+ Valvular Problems/Murmurs  Rhythm:Regular Rate:Normal     Neuro/Psych PSYCHIATRIC DISORDERS Anxiety negative neurological ROS     GI/Hepatic Neg liver ROS, GERD  Medicated and Controlled,  Endo/Other  negative endocrine ROS  Renal/GU negative Renal ROS     Musculoskeletal negative musculoskeletal ROS (+)   Abdominal (+) - obese,   Peds  Hematology negative hematology ROS (+)   Anesthesia Other Findings Colon cancer  Reproductive/Obstetrics                             Anesthesia Physical  Anesthesia Plan  ASA: III  Anesthesia Plan: General   Post-op Pain Management:    Induction: Intravenous  PONV Risk Score and Plan: 3 and Ondansetron, Dexamethasone and Midazolam  Airway Management Planned: Oral ETT  Additional Equipment:   Intra-op Plan:   Post-operative Plan: Extubation in OR  Informed Consent: I have reviewed the patients History and Physical, chart, labs and discussed the procedure including the risks, benefits and alternatives for the proposed anesthesia with the patient or authorized representative who has indicated his/her understanding and acceptance.   Dental advisory given  Plan Discussed with: CRNA  Anesthesia Plan Comments:         Anesthesia Quick Evaluation

## 2017-10-07 NOTE — H&P (Signed)
Frances Ortiz  Location: [facility] Patient #: [id number] DOB: [birth year] Divorced / Language: Cleophus Molt / Race: White Female   History of Present Illness The patient is a 83 year old female who presents with colorectal cancer. We are asked to see the patient in consultation by Dr. Ellin Goodie to evaluate her for a cecal cancer. The patient is a 83 year old white female who recently had an episode of blood per rectum. As part of her workup she underwent a colonoscopy which found a large mass in the cecal area. This was nonobstructing. This was biopsied and it came back as a moderately differentiated adenocarcinoma. She denies any abdominal pain. Her appetite has been good and her bowels been moving regularly. She denies problems with constipation. She denies any weight loss. She has been getting abdomen and pelvis CT scans every 6 months for the last few years as follow-up for melanoma. These scans have been unremarkable.   Past Surgical History  Breast Biopsy  Bilateral. Cataract Surgery  Bilateral. Cesarean Section - Multiple  Colon Polyp Removal - Colonoscopy  Gallbladder Surgery - Laparoscopic  Sentinel Lymph Node Biopsy  Tonsillectomy   Diagnostic Studies History Colonoscopy  within last year Mammogram  within last year Pap Smear  >5 years ago  Allergies Erythromycin *DERMATOLOGICALS*   Medication History Sertraline HCl (50MG  Tablet, Oral) Active. Pantoprazole Sodium (40MG  Tablet DR, Oral) Active. Biotin (400MCG Tablet, Oral) Active. Vitamin D (Oral) Specific strength unknown - Active. Medications Reconciled  Social History  Caffeine use  Tea. No alcohol use  No drug use  Tobacco use  Never smoker.  Family History  Arthritis  Mother. Depression  Mother. Heart Disease  Mother. Melanoma  Daughter. Migraine Headache  Daughter.  Pregnancy / Birth History Age at menarche  55 years. Age of menopause  61-50 Gravida   2 Maternal age  43-30 Para  2  Other Problems  Anxiety Disorder  Cholelithiasis  Depression  Diverticulosis  Gastroesophageal Reflux Disease  Heart murmur  Hypercholesterolemia  Lump In Breast  Melanoma  Pulmonary Embolism / Blood Clot in Legs     Review of Systems  General Not Present- Appetite Loss, Chills, Fatigue, Fever, Night Sweats, Weight Gain and Weight Loss. Skin Present- Dryness. Not Present- Change in Wart/Mole, Hives, Jaundice, New Lesions, Non-Healing Wounds, Rash and Ulcer. HEENT Not Present- Earache, Hearing Loss, Hoarseness, Nose Bleed, Oral Ulcers, Ringing in the Ears, Seasonal Allergies, Sinus Pain, Sore Throat, Visual Disturbances, Wears glasses/contact lenses and Yellow Eyes. Respiratory Not Present- Bloody sputum, Chronic Cough, Difficulty Breathing, Snoring and Wheezing. Breast Not Present- Breast Mass, Breast Pain, Nipple Discharge and Skin Changes. Cardiovascular Present- Leg Cramps. Not Present- Chest Pain, Difficulty Breathing Lying Down, Palpitations, Rapid Heart Rate, Shortness of Breath and Swelling of Extremities. Gastrointestinal Present- Indigestion. Not Present- Abdominal Pain, Bloating, Bloody Stool, Change in Bowel Habits, Chronic diarrhea, Constipation, Difficulty Swallowing, Excessive gas, Gets full quickly at meals, Hemorrhoids, Nausea, Rectal Pain and Vomiting. Female Genitourinary Present- Urgency. Not Present- Frequency, Nocturia, Painful Urination and Pelvic Pain. Musculoskeletal Present- Joint Pain. Not Present- Back Pain, Joint Stiffness, Muscle Pain, Muscle Weakness and Swelling of Extremities. Neurological Not Present- Decreased Memory, Fainting, Headaches, Numbness, Seizures, Tingling, Tremor, Trouble walking and Weakness. Psychiatric Present- Anxiety. Not Present- Bipolar, Change in Sleep Pattern, Depression, Fearful and Frequent crying. Endocrine Not Present- Cold Intolerance, Excessive Hunger, Hair Changes, Heat  Intolerance, Hot flashes and New Diabetes. Hematology Not Present- Blood Thinners, Easy Bruising, Excessive bleeding, Gland problems, HIV and Persistent Infections.  Vitals Weight: 142.5 lb Height: 59in Body Surface Area: 1.6 m Body Mass Index: 28.78 kg/m  Temp.: 98.64F  Pulse: 96 (Regular)  BP: 142/84 (Sitting, Left Arm, Standard)       Physical Exam General Mental Status-Alert. General Appearance-Consistent with stated age. Hydration-Well hydrated. Voice-Normal.  Head and Neck Head-normocephalic, atraumatic with no lesions or palpable masses. Trachea-midline. Thyroid Gland Characteristics - normal size and consistency.  Eye Eyeball - Bilateral-Extraocular movements intact. Sclera/Conjunctiva - Bilateral-No scleral icterus.  Chest and Lung Exam Chest and lung exam reveals -quiet, even and easy respiratory effort with no use of accessory muscles and on auscultation, normal breath sounds, no adventitious sounds and normal vocal resonance. Inspection Chest Wall - Normal. Back - normal.  Cardiovascular Cardiovascular examination reveals -normal heart sounds, regular rate and rhythm with no murmurs and normal pedal pulses bilaterally. Note: There is a significant systolic heart murmur.   Abdomen Note: The abdomen is soft and nontender. There is no palpable mass. She has a well-healed lower midline scar. There is also well healed laparoscopic cholecystectomy scars. There is no palpable groin or supraclavicular lymphadenopathy.   Neurologic Neurologic evaluation reveals -alert and oriented x 3 with no impairment of recent or remote memory. Mental Status-Normal.  Musculoskeletal Normal Exam - Left-Upper Extremity Strength Normal and Lower Extremity Strength Normal. Normal Exam - Right-Upper Extremity Strength Normal and Lower Extremity Strength Normal.  Lymphatic Head & Neck  General Head & Neck Lymphatics: Bilateral -  Description - Normal. Axillary  General Axillary Region: Bilateral - Description - Normal. Tenderness - Non Tender. Femoral & Inguinal  Generalized Femoral & Inguinal Lymphatics: Bilateral - Description - Normal. Tenderness - Non Tender.    Assessment & Plan  CECAL CANCER (C18.0) Impression: The patient appears to have a moderate-sized non-obstructing cancer at the cecum. My recommendation is that this be removed with a right colectomy. I think she would be a good candidate for a laparoscopic type of procedure. I have discussed with her in detail the risks and benefits of the operation as well as some of the technical aspects and she understands and wishes to proceed. We will obtain a preoperative CT scan of the abdomen and pelvis for surgical planning. Current Plans CT ABDOMEN AND PELVIS W CONTRAST (32992) Pt Education - Colorectal Cancer: discussed with patient and provided information.

## 2017-10-07 NOTE — Anesthesia Procedure Notes (Signed)
Procedure Name: Intubation Date/Time: 10/07/2017 12:50 PM Performed by: Orlie Dakin, CRNA Pre-anesthesia Checklist: Patient identified, Emergency Drugs available, Suction available, Patient being monitored and Timeout performed Patient Re-evaluated:Patient Re-evaluated prior to induction Oxygen Delivery Method: Circle system utilized Preoxygenation: Pre-oxygenation with 100% oxygen Induction Type: IV induction Ventilation: Mask ventilation without difficulty Laryngoscope Size: Miller and 3 Grade View: Grade I Tube type: Oral Tube size: 7.5 mm Number of attempts: 1 Airway Equipment and Method: Stylet Placement Confirmation: ETT inserted through vocal cords under direct vision,  positive ETCO2 and breath sounds checked- equal and bilateral Secured at: 22 cm Tube secured with: Tape Dental Injury: Teeth and Oropharynx as per pre-operative assessment  Comments: 4x4s bite block used.

## 2017-10-07 NOTE — Op Note (Signed)
10/07/2017  3:02 PM  PATIENT:  Frances Ortiz  83 y.o. female  PRE-OPERATIVE DIAGNOSIS:  cecal cancer  POST-OPERATIVE DIAGNOSIS:  cecal cancer  PROCEDURE:  Procedure(s): LAPAROSCOPIC RIGHT COLECTOMY ERAS PATHWAY (Right)  SURGEON:  Surgeon(s) and Role:    * Jovita Kussmaul, MD - Primary  PHYSICIAN ASSISTANT:   ASSISTANTS: Sharyn Dross, RNFA   ANESTHESIA:   local and general  EBL:  50 mL   BLOOD ADMINISTERED:none  DRAINS: none   LOCAL MEDICATIONS USED:  MARCAINE     SPECIMEN:  Source of Specimen:  terminal ileum and right colon  DISPOSITION OF SPECIMEN:  PATHOLOGY  COUNTS:  YES  TOURNIQUET:  * No tourniquets in log *  DICTATION: .Dragon Dictation   After informed consent was obtained the patient was brought to the operating room and placed in the supine position on the operating table.  After adequate induction of general anesthesia the patient's abdomen was prepped with ChloraPrep, allowed to dry, and draped in usual sterile manner.  An appropriate timeout was performed.  Site was chosen in the left upper quadrant access the abdominal cavity.  This area was infiltrated with quarter percent Marcaine and a small stab incision was made with a 15 blade knife.  A 5 mm Optiview port and camera were used to bluntly dissected the layers of the abdominal wall under direct vision until access was gained to the abdominal cavity.  The abdomen was then insufflated with carbon dioxide without difficulty.  The camera was placed through the port and the abdomen was inspected.  The cecum with the [place] ink tattoo was readily identified.  There was some significant omental adhesion to the anterior abdominal wall.  2 other 5 mm ports were placed on the left side of the abdomen under direct vision.  The omental adhesions were taken down sharply with the harmonic scalpel.  I was then able to better visualize the right colon.  The right colon was mobilized by incising its retroperitoneal attachment  along the white line of Toldt.  The transverse colon was also mobilized away from the stomach and liver using the harmonic scalpel.  Care was taken to identify the stomach and duodenal area and avoid this during the dissection.  Once there was good mobilization of the right colon and transverse colon and distal small bowel I then made an upper midline small incision with a 10 blade knife.  The incision was carried through the skin and subcutaneous tissue sharply with electrocautery.  The fascia of the abdominal wall was also incised with the cautery until access was gained to the abdominal cavity.  The rest of the incision was opened under direct vision.  An [place] wound protector was deployed.  The terminal ileum and right colon and transverse colon were easily brought out through the abdominal incision.  A site was chosen at the hepatic flexure for division of the colon.  The mesentery at this point was opened sharply with electrocautery.  A GIA-75 stapler was placed across the colon at this point clamped and fired thereby dividing the colon between staple lines.  A similar site was chosen to divide the distal small bowel and the mesentery at this point was opened sharply with electrocautery.  A GIA-75 stapler was placed across the small bowel at this point, clamped, and fired thereby dividing the bowel between staple lines.  The mesentery to the right colon was taken down sharply with the LigaSure.  The main right colic artery was clamped  with a Kelly clamp and divided with the LigaSure and then tied off with a 2-0 silk suture ligature.  The terminal ileum and right colon with the cancer was then removed from the patient and sent to pathology for further evaluation.  The distal small bowel and proximal transverse colon approximated each other easily.  A small opening was made on the antimesenteric side of each limb of small bowel and colon.  Each limb of a GIA-75 stapler was then placed down the appropriate limb  of bowel.  The staple device was clamped and fired thereby creating a nice widely patent enteroenterostomy.  The common opening was closed with a TA 60 stapler.  The staple line was imbricated with multiple 2-0 silk Lembert stitches as well as a 2-0 silk crotch stitch.  The mesenteric defect was closed with figure-of-eight 2-0 silk stitches.  Once this was accomplished the anastomosis appeared very healthy without any tension.  The anastomosis was placed back in the abdominal cavity.  The abdomen was irrigated with copious amounts of saline.  The abdomen is otherwise generally inspected and no other abnormalities were noted.  The liver surface was smooth.  At this point the ports were removed.  The fascia of the abdominal wall was closed with 2 running #1 double-stranded looped PDS sutures.  The subcutaneous tissue was irrigated with copious amounts of saline.  The skin incisions were closed with staples.  Sterile dressings were applied.  The patient tolerated the procedure well.  At the end of the case all needle sponge and instrument counts were correct.  The patient was then awakened and taken to recovery in stable condition.  PLAN OF CARE: Admit to inpatient   PATIENT DISPOSITION:  PACU - hemodynamically stable.   Delay start of Pharmacological VTE agent (>24hrs) due to surgical blood loss or risk of bleeding: no

## 2017-10-08 LAB — CBC
HCT: 30.2 % — ABNORMAL LOW (ref 36.0–46.0)
HEMOGLOBIN: 9.4 g/dL — AB (ref 12.0–15.0)
MCH: 27.2 pg (ref 26.0–34.0)
MCHC: 31.1 g/dL (ref 30.0–36.0)
MCV: 87.3 fL (ref 78.0–100.0)
Platelets: 320 10*3/uL (ref 150–400)
RBC: 3.46 MIL/uL — AB (ref 3.87–5.11)
RDW: 14.8 % (ref 11.5–15.5)
WBC: 10.2 10*3/uL (ref 4.0–10.5)

## 2017-10-08 LAB — BASIC METABOLIC PANEL
ANION GAP: 5 (ref 5–15)
BUN: 10 mg/dL (ref 6–20)
CHLORIDE: 106 mmol/L (ref 101–111)
CO2: 26 mmol/L (ref 22–32)
Calcium: 8.2 mg/dL — ABNORMAL LOW (ref 8.9–10.3)
Creatinine, Ser: 0.65 mg/dL (ref 0.44–1.00)
GFR calc non Af Amer: >60 mL/min (ref >60)
Glucose, Bld: 119 mg/dL — ABNORMAL HIGH (ref 65–99)
Potassium: 3.7 mmol/L (ref 3.5–5.1)
Sodium: 137 mmol/L (ref 135–145)

## 2017-10-08 NOTE — Progress Notes (Signed)
1 Day Post-Op   Subjective/Chief Complaint: No complaints   Objective: Vital signs in last 24 hours: Temp:  [97.5 F (36.4 C)-99.3 F (37.4 C)] 99.3 F (37.4 C) (12/18 1338) Pulse Rate:  [84-97] 94 (12/18 1338) Resp:  [16-19] 18 (12/18 0917) BP: (138-160)/(63-67) 160/66 (12/18 1338) SpO2:  [97 %-100 %] 99 % (12/18 1338) Last BM Date: 10/07/17(prior to surgery)  Intake/Output from previous day: 12/17 0701 - 12/18 0700 In: 2910 [I.V.:2910] Out: 1025 [Urine:975; Blood:50] Intake/Output this shift: Total I/O In: 1171.7 [P.O.:240; I.V.:931.7] Out: 1400 [Urine:1400]  General appearance: alert and cooperative Resp: clear to auscultation bilaterally Cardio: regular rate and rhythm GI: soft, nontender. good bs  Lab Results:  Recent Labs    10/08/17 0444  WBC 10.2  HGB 9.4*  HCT 30.2*  PLT 320   BMET Recent Labs    10/08/17 0444  NA 137  K 3.7  CL 106  CO2 26  GLUCOSE 119*  BUN 10  CREATININE 0.65  CALCIUM 8.2*   PT/INR No results for input(s): LABPROT, INR in the last 72 hours. ABG No results for input(s): PHART, HCO3 in the last 72 hours.  Invalid input(s): PCO2, PO2  Studies/Results: No results found.  Anti-infectives: Anti-infectives (From admission, onward)   Start     Dose/Rate Route Frequency Ordered Stop   10/07/17 1145  cefoTEtan in Dextrose 5% (CEFOTAN) IVPB 2 g     2 g Intravenous On call to O.R. 10/07/17 1009 10/07/17 1315   10/07/17 1011  cefoTEtan in Dextrose 5% (CEFOTAN) 2-2.08 GM-%(50ML) IVPB    Comments:  Frances Ortiz   : cabinet override      10/07/17 1011 10/07/17 1300      Assessment/Plan: s/p Procedure(s): LAPAROSCOPIC RIGHT COLECTOMY ERAS PATHWAY (Right) Advance diet. Start clears ambulate  LOS: 1 day    [name],PAUL S 10/08/2017

## 2017-10-09 ENCOUNTER — Encounter (HOSPITAL_COMMUNITY): Payer: Self-pay | Admitting: General Surgery

## 2017-10-09 NOTE — Care Management Important Message (Signed)
Important Message  Patient Details  Name: Frances Ortiz MRN: [id number] Date of Birth: 01-Jun-1931   Medicare Important Message Given:  Yes    Orbie Pyo 10/09/2017, 3:39 PM

## 2017-10-09 NOTE — Progress Notes (Signed)
2 Days Post-Op   Subjective/Chief Complaint: No complaints. No flatus yet   Objective: Vital signs in last 24 hours: Temp:  [97.4 F (36.3 C)-99.4 F (37.4 C)] 99.4 F (37.4 C) (12/19 1458) Pulse Rate:  [89-92] 89 (12/19 1458) Resp:  [17-18] 18 (12/19 1458) BP: (142-163)/(65-71) 142/65 (12/19 1458) SpO2:  [93 %-97 %] 97 % (12/19 1458) Last BM Date: 10/07/17(prior to surgery)  Intake/Output from previous day: 12/18 0701 - 12/19 0700 In: 1983.3 [P.O.:420; I.V.:1563.3] Out: 2600 [Urine:2600] Intake/Output this shift: Total I/O In: 270 [P.O.:270] Out: 1200 [Urine:1200]  General appearance: alert and cooperative Resp: clear to auscultation bilaterally Cardio: regular rate and rhythm GI: soft, mild tenderness. incision looks good. good bs  Lab Results:  Recent Labs    10/08/17 0444  WBC 10.2  HGB 9.4*  HCT 30.2*  PLT 320   BMET Recent Labs    10/08/17 0444  NA 137  K 3.7  CL 106  CO2 26  GLUCOSE 119*  BUN 10  CREATININE 0.65  CALCIUM 8.2*   PT/INR No results for input(s): LABPROT, INR in the last 72 hours. ABG No results for input(s): PHART, HCO3 in the last 72 hours.  Invalid input(s): PCO2, PO2  Studies/Results: No results found.  Anti-infectives: Anti-infectives (From admission, onward)   Start     Dose/Rate Route Frequency Ordered Stop   10/07/17 1145  cefoTEtan in Dextrose 5% (CEFOTAN) IVPB 2 g     2 g Intravenous On call to O.R. 10/07/17 1009 10/07/17 1315   10/07/17 1011  cefoTEtan in Dextrose 5% (CEFOTAN) 2-2.08 GM-%(50ML) IVPB    Comments:  Leandrew Koyanagi   : cabinet override      10/07/17 1011 10/07/17 1300      Assessment/Plan: s/p Procedure(s): LAPAROSCOPIC RIGHT COLECTOMY ERAS PATHWAY (Right) Continue clears until bowel function returns  Ambulate POD 2  LOS: 2 days    [name],PAUL S 10/09/2017

## 2017-10-10 LAB — CBC WITH DIFFERENTIAL/PLATELET
BASOS ABS: 0 10*3/uL (ref 0.0–0.1)
BASOS PCT: 0 %
Eosinophils Absolute: 0.5 10*3/uL (ref 0.0–0.7)
Eosinophils Relative: 6 %
HEMATOCRIT: 32.5 % — AB (ref 36.0–46.0)
HEMOGLOBIN: 10.3 g/dL — AB (ref 12.0–15.0)
LYMPHS PCT: 17 %
Lymphs Abs: 1.4 10*3/uL (ref 0.7–4.0)
MCH: 27.5 pg (ref 26.0–34.0)
MCHC: 31.7 g/dL (ref 30.0–36.0)
MCV: 86.9 fL (ref 78.0–100.0)
MONOS PCT: 9 %
Monocytes Absolute: 0.7 10*3/uL (ref 0.1–1.0)
NEUTROS ABS: 5.5 10*3/uL (ref 1.7–7.7)
NEUTROS PCT: 68 %
Platelets: 346 10*3/uL (ref 150–400)
RBC: 3.74 MIL/uL — ABNORMAL LOW (ref 3.87–5.11)
RDW: 14.6 % (ref 11.5–15.5)
WBC: 8.2 10*3/uL (ref 4.0–10.5)

## 2017-10-10 LAB — BASIC METABOLIC PANEL
ANION GAP: 9 (ref 5–15)
BUN: 8 mg/dL (ref 6–20)
CHLORIDE: 103 mmol/L (ref 101–111)
CO2: 25 mmol/L (ref 22–32)
Calcium: 8.7 mg/dL — ABNORMAL LOW (ref 8.9–10.3)
Creatinine, Ser: 0.6 mg/dL (ref 0.44–1.00)
GFR calc non Af Amer: >60 mL/min (ref >60)
Glucose, Bld: 110 mg/dL — ABNORMAL HIGH (ref 65–99)
Potassium: 3.3 mmol/L — ABNORMAL LOW (ref 3.5–5.1)
Sodium: 137 mmol/L (ref 135–145)

## 2017-10-10 MED ORDER — SERTRALINE HCL 50 MG PO TABS
50.0000 mg | ORAL_TABLET | Freq: Every day | ORAL | Status: DC
  Administered 2017-10-10 – 2017-10-12 (×3): 50 mg via ORAL
  Filled 2017-10-10 (×3): qty 1

## 2017-10-11 MED ORDER — PANTOPRAZOLE SODIUM 40 MG PO TBEC
40.0000 mg | DELAYED_RELEASE_TABLET | Freq: Every day | ORAL | Status: DC
  Administered 2017-10-11: 40 mg via ORAL
  Filled 2017-10-11: qty 1

## 2017-10-11 NOTE — Progress Notes (Signed)
4 Days Post-Op   Subjective/Chief Complaint: No complaints. Had 3 bm's overnight   Objective: Vital signs in last 24 hours: Temp:  [97.6 F (36.4 C)-98.8 F (37.1 C)] 97.6 F (36.4 C) (12/21 0557) Pulse Rate:  [92-102] 102 (12/21 0557) Resp:  [18] 18 (12/21 0557) BP: (112-139)/(35-71) 112/61 (12/21 0557) SpO2:  [94 %-97 %] 94 % (12/21 0557) Last BM Date: 10/11/17  Intake/Output from previous day: 12/20 0701 - 12/21 0700 In: 120 [P.O.:120] Out: -  Intake/Output this shift: No intake/output data recorded.  General appearance: alert and cooperative Resp: clear to auscultation bilaterally Cardio: regular rate and rhythm GI: soft, mild tenderness. incision looks good. having bm's  Lab Results:  Recent Labs    10/10/17 1036  WBC 8.2  HGB 10.3*  HCT 32.5*  PLT 346   BMET Recent Labs    10/10/17 1036  NA 137  K 3.3*  CL 103  CO2 25  GLUCOSE 110*  BUN 8  CREATININE 0.60  CALCIUM 8.7*   PT/INR No results for input(s): LABPROT, INR in the last 72 hours. ABG No results for input(s): PHART, HCO3 in the last 72 hours.  Invalid input(s): PCO2, PO2  Studies/Results: No results found.  Anti-infectives: Anti-infectives (From admission, onward)   Start     Dose/Rate Route Frequency Ordered Stop   10/07/17 1145  cefoTEtan in Dextrose 5% (CEFOTAN) IVPB 2 g     2 g Intravenous On call to O.R. 10/07/17 1009 10/07/17 1315   10/07/17 1011  cefoTEtan in Dextrose 5% (CEFOTAN) 2-2.08 GM-%(50ML) IVPB    Comments:  Leandrew Koyanagi   : cabinet override      10/07/17 1011 10/07/17 1300      Assessment/Plan: s/p Procedure(s): LAPAROSCOPIC RIGHT COLECTOMY ERAS PATHWAY (Right) Advance diet. Start fulls today and advance as tolerated Ambulate POD 4 I discussed path with her  LOS: 4 days    [name],Raffi Milstein S 10/11/2017

## 2017-10-11 NOTE — Progress Notes (Signed)
No complaints. No flatus yet  Vitals. Stable  Lungs: clear bilaterally Abd: Soft, mild tenderness. Incision looks good. Good bs  S/P Lap assisted Right colectomy  Continue clears until bowel function returns Ambulate POD 3  Will restart Zoloft

## 2017-10-12 NOTE — Discharge Instructions (Signed)
CENTRAL Lapel SURGERY - DISCHARGE INSTRUCTIONS TO PATIENT Activity:  Driving - May drive in 4 or 5 days, if doing well   Lifting - No lifting more than 15 pounds for 10 days, then no limit  Wound Care:   May shower  Diet:  As tolerated  Follow up appointment:  You have an appointment with Dr. Marlou Starks.   Call the office [facility]) at [phone] for any change in appointment.  Medications and dosages:  Resume your home medications.  You have a prescription for:  Ultram  Call Dr. Marlou Starks or his office  [phone]) if you have:  Temperature greater than 100.4,  Persistent nausea and vomiting,  Severe uncontrolled pain,  Redness, tenderness, or signs of infection (pain, swelling, redness, odor or green/yellow discharge around the site),  Difficulty breathing, headache or visual disturbances,  Any other questions or concerns you may have after discharge.  In an emergency, call 911 or go to an Emergency Department at a nearby hospital.

## 2017-10-12 NOTE — Progress Notes (Signed)
Pt discharged to home accompanied by daughter.  DC instructions given and reviewed.  Meds reviewed.  Follow up appointments reviewed.  Dr. Lucia Gaskins was in and Mier the honey comb dressing and other dressings and told the pt to keep open to air.  No further questions about home self care.

## 2017-10-12 NOTE — Progress Notes (Signed)
[facility] Office:  [phone] General Surgery Progress Note   LOS: 5 days  POD -  5 Days Post-Op  Chief Complaint: Colon cancer  Assessment and Plan: 1.  LAPAROSCOPIC RIGHT COLECTOMY - 10/07/2017  Path - 5.1 cm adenoca, 0/20 nodes (T3, N0)  2.  History of melanoma to scalp 3. DVT prophylaxis - SQ Heparin   Active Problems:   Cecal cancer (HCC)  Subjective:  Doing well.  Taking po's and has had BM.  To go home by self.  Has daughter that lives nearby.  Objective:   Vitals:   10/11/17 2041 10/12/17 0521  BP: (!) 125/59 (!) 125/57  Pulse: 93 85  Resp: 16 17  Temp: 98.4 F (36.9 C) 98.6 F (37 C)  SpO2: 92% 97%     Intake/Output from previous day:  12/21 0701 - 12/22 0700 In: 580 [P.O.:180; I.V.:400] Out: -   Intake/Output this shift:  No intake/output data recorded.   Physical Exam:   General: WN older WF who is alert and oriented.    HEENT: Normal. Pupils equal. .   Lungs: Clear.   Abdomen: Mild distention, has a few BS.   Wound: Clean.  Will leave staples.   Lab Results:    Recent Labs    10/10/17 1036  WBC 8.2  HGB 10.3*  HCT 32.5*  PLT 346    BMET   Recent Labs    10/10/17 1036  NA 137  K 3.3*  CL 103  CO2 25  GLUCOSE 110*  BUN 8  CREATININE 0.60  CALCIUM 8.7*    PT/INR  No results for input(s): LABPROT, INR in the last 72 hours.  ABG  No results for input(s): PHART, HCO3 in the last 72 hours.  Invalid input(s): PCO2, PO2   Studies/Results:  No results found.   Anti-infectives:   Anti-infectives (From admission, onward)   Start     Dose/Rate Route Frequency Ordered Stop   10/07/17 1145  cefoTEtan in Dextrose 5% (CEFOTAN) IVPB 2 g     2 g Intravenous On call to O.R. 10/07/17 1009 10/07/17 1315   10/07/17 1011  cefoTEtan in Dextrose 5% (CEFOTAN) 2-2.08 GM-%(50ML) IVPB    Comments:  Leandrew Koyanagi   : cabinet override      10/07/17 1011 10/07/17 1300      Alphonsa Overall, MD, FACS Pager:  [facility] Office: [phone] 10/12/2017

## 2017-10-12 NOTE — Discharge Summary (Signed)
Physician Discharge Summary  Patient ID:  Frances Ortiz  MRN: [id number]  DOB/AGE: Jan 13, 1931 83 y.o.  Admit date: 10/07/2017 Discharge date: 10/12/2017  Discharge Diagnoses:  1.  5.1 cm adenoca of the cecum, 0/20 nodes (T3, N0) 2.  History of melanoma to scalp   Active Problems:   Cecal cancer [facility])  Operation: Procedure(s):  LAPAROSCOPIC RIGHT COLECTOMY on 10/07/2017 Marlou Starks  Discharged Condition: good  Hospital Course: Frances Ortiz is an 83 y.o. female whose primary care physician is Alroy Dust, L.Marlou Sa, MD and who was admitted 10/07/2017 with a chief complaint of Cecal Cancer.   She was brought to the operating room on 10/07/2017 and underwent LAPAROSCOPIC RIGHT COLECTOMY .   The discharge instructions were reviewed with the patient.  Consults: None  Significant Diagnostic Studies: Results for orders placed or performed during the hospital encounter of 31/49/70  Basic metabolic panel  Result Value Ref Range   Sodium 137 135 - 145 mmol/L   Potassium 3.7 3.5 - 5.1 mmol/L   Chloride 106 101 - 111 mmol/L   CO2 26 22 - 32 mmol/L   Glucose, Bld 119 (H) 65 - 99 mg/dL   BUN 10 6 - 20 mg/dL   Creatinine, Ser 0.65 0.44 - 1.00 mg/dL   Calcium 8.2 (L) 8.9 - 10.3 mg/dL   GFR calc non Af Amer >60 >60 mL/min   GFR calc Af Amer >60 >60 mL/min   Anion gap 5 5 - 15  CBC  Result Value Ref Range   WBC 10.2 4.0 - 10.5 K/uL   RBC 3.46 (L) 3.87 - 5.11 MIL/uL   Hemoglobin 9.4 (L) 12.0 - 15.0 g/dL   HCT 30.2 (L) 36.0 - 46.0 %   MCV 87.3 78.0 - 100.0 fL   MCH 27.2 26.0 - 34.0 pg   MCHC 31.1 30.0 - 36.0 g/dL   RDW 14.8 11.5 - 15.5 %   Platelets 320 150 - 400 K/uL  CBC with Differential/Platelet  Result Value Ref Range   WBC 8.2 4.0 - 10.5 K/uL   RBC 3.74 (L) 3.87 - 5.11 MIL/uL   Hemoglobin 10.3 (L) 12.0 - 15.0 g/dL   HCT 32.5 (L) 36.0 - 46.0 %   MCV 86.9 78.0 - 100.0 fL   MCH 27.5 26.0 - 34.0 pg   MCHC 31.7 30.0 - 36.0 g/dL   RDW 14.6 11.5 - 15.5 %   Platelets 346 150 - 400 K/uL    Neutrophils Relative % 68 %   Neutro Abs 5.5 1.7 - 7.7 K/uL   Lymphocytes Relative 17 %   Lymphs Abs 1.4 0.7 - 4.0 K/uL   Monocytes Relative 9 %   Monocytes Absolute 0.7 0.1 - 1.0 K/uL   Eosinophils Relative 6 %   Eosinophils Absolute 0.5 0.0 - 0.7 K/uL   Basophils Relative 0 %   Basophils Absolute 0.0 0.0 - 0.1 K/uL  Basic metabolic panel  Result Value Ref Range   Sodium 137 135 - 145 mmol/L   Potassium 3.3 (L) 3.5 - 5.1 mmol/L   Chloride 103 101 - 111 mmol/L   CO2 25 22 - 32 mmol/L   Glucose, Bld 110 (H) 65 - 99 mg/dL   BUN 8 6 - 20 mg/dL   Creatinine, Ser 0.60 0.44 - 1.00 mg/dL   Calcium 8.7 (L) 8.9 - 10.3 mg/dL   GFR calc non Af Amer >60 >60 mL/min   GFR calc Af Amer >60 >60 mL/min   Anion gap 9 5 -  15    Ct Abdomen Pelvis W Contrast  Result Date: 10/02/2017 CLINICAL DATA:  New diagnosis of cecal cancer. Right abdominal pain. EXAM: CT ABDOMEN AND PELVIS WITH CONTRAST TECHNIQUE: Multidetector CT imaging of the abdomen and pelvis was performed using the standard protocol following bolus administration of intravenous contrast. CONTRAST:  132mL ISOVUE-300 IOPAMIDOL (ISOVUE-300) INJECTION 61% COMPARISON:  None. FINDINGS: Lower chest: Small hiatal hernia. Hepatobiliary: 9 mm subcapsular left lobe hypoattenuated hepatic lesion, too small to be actually characterize by CT. Postcholecystectomy. No evidence of biliary ductal dilation. Pancreas: Unremarkable. No pancreatic ductal dilatation or surrounding inflammatory changes. Spleen: Normal in size without focal abnormality. Adrenals/Urinary Tract: 1.6 cm circumscribed right adrenal nodule, with intermediate attenuation. Normal left adrenal gland. Bilateral renal cortical thinning. 7 mm nonobstructive left lower pole renal calculus. Smaller tiny nonobstructive calculi within the mid and upper pole of the left kidney. Left renal cysts, the largest of which measures 2.9 cm. Stomach/Bowel: Normal appearance of the stomach and small bowel. Cecal  mass just inferior to the terminal ileum measures 5 x 4.5 x 5 cm. There is no evidence of obstructive sequela. The appendix is fluid-filled, borderline thickened and arises from this mass. There is possible extension of the tumor into the base of the appendix, image 54/91, sequence 2. Left colonic diverticulosis. Nonspecific circumferential thickening of the rectum, poorly evaluated due to lack of contrast within it. Vascular/Lymphatic: Subcentimeter lymph nodes are seen in in the cecal mesentery. No pathologic lymphadenopathy by CT criteria. Mild atherosclerotic disease of the aorta. Reproductive: Uterus and bilateral adnexa are unremarkable. Other: Small fat containing periumbilical hernia. No evidence of abdominopelvic ascites. Musculoskeletal: Lower lumbosacral spine posterior facet arthropathy with minimal anterolisthesis of L4 on L5. IMPRESSION: Known right cecal malignancy measures up to 5 cm, and extends to the base of the appendix, which is fluid-filled and borderline thickened to 7 mm. A cluster of subcentimeter right lower quadrant mesenteric lymph nodes, which are sub pathologic by CT criteria, but suspicious for malignant lymphadenopathy, given the proximity of the cecal mass. 9 mm left hepatic lobe subcapsular indeterminate hypoattenuated nodule. 1.6 cm indeterminate right adrenal nodule. Left nephrolithiasis and renal cysts. Electronically Signed   By: Fidela Salisbury M.D.   On: 10/02/2017 20:05    Discharge Exam:  Vitals:   10/11/17 2041 10/12/17 0521  BP: (!) 125/59 (!) 125/57  Pulse: 93 85  Resp: 16 17  Temp: 98.4 F (36.9 C) 98.6 F (37 C)  SpO2: 92% 97%    General: WN older WF who is alert and generally healthy appearing.  Lungs: Clear to auscultation and symmetric breath sounds. Heart:  RRR. No murmur or rub. Abdomen: Mild distention.  Incisions look good.  Few bowel sounds.   Discharge Medications:   Allergies as of 10/12/2017      Reactions   Aristocort  [triamcinolone] Other (See Comments)   Emotional instability   Cortisone Other (See Comments)   Emotional instability   Erythromycin Other (See Comments)   Stomach pain   Other Other (See Comments)   Epinephrine in novocain makes heart race.   Hydrocodone-acetaminophen Rash      Medication List    TAKE these medications   Biotin 5 MG Caps Take 5 mg by mouth daily.   multivitamin tablet Take 1 tablet by mouth daily.   naproxen sodium 220 MG tablet Commonly known as:  ALEVE Take 220 mg by mouth 2 (two) times daily as needed (for pain or headache).   pantoprazole 40 MG tablet  Commonly known as:  PROTONIX Take 40 mg by mouth daily.   sertraline 50 MG tablet Commonly known as:  ZOLOFT Take 50 mg by mouth daily.   Vitamin D3 2000 units capsule Take 2,000 Units by mouth daily.       Disposition: 01-Home or Self Care  Discharge Instructions    Diet - low sodium heart healthy   Complete by:  As directed    Increase activity slowly   Complete by:  As directed          Signed: Alphonsa Overall, M.D., [facility] Surgery Office:  [phone]  10/12/2017, 10:08 AM

## 2017-10-16 NOTE — Anesthesia Postprocedure Evaluation (Signed)
Anesthesia Post Note  Patient: Frances Ortiz  Procedure(s) Performed: LAPAROSCOPIC RIGHT COLECTOMY ERAS PATHWAY (Right )     Patient location during evaluation: PACU Anesthesia Type: General Level of consciousness: awake and sedated Pain management: pain level controlled Vital Signs Assessment: post-procedure vital signs reviewed and stable Respiratory status: spontaneous breathing, nonlabored ventilation, respiratory function stable and patient connected to nasal cannula oxygen Cardiovascular status: blood pressure returned to baseline and stable Postop Assessment: no apparent nausea or vomiting Anesthetic complications: no    Last Vitals:  Vitals:   10/11/17 2041 10/12/17 0521  BP: (!) 125/59 (!) 125/57  Pulse: 93 85  Resp: 16 17  Temp: 36.9 C 37 C  SpO2: 92% 97%    Last Pain:  Vitals:   10/12/17 0521  TempSrc: Oral  PainSc:                  Adelai Achey,JAMES TERRILL

## 2017-11-05 ENCOUNTER — Encounter: Payer: Self-pay | Admitting: Oncology

## 2017-11-05 ENCOUNTER — Inpatient Hospital Stay: Payer: Medicare Other | Attending: Oncology | Admitting: Oncology

## 2017-11-05 ENCOUNTER — Telehealth: Payer: Self-pay | Admitting: Oncology

## 2017-11-05 VITALS — BP 147/72 | HR 107 | Temp 98.4°F | Resp 18 | Ht 59.0 in | Wt 137.2 lb

## 2017-11-05 DIAGNOSIS — Z86010 Personal history of colonic polyps: Secondary | ICD-10-CM

## 2017-11-05 DIAGNOSIS — C180 Malignant neoplasm of cecum: Secondary | ICD-10-CM | POA: Diagnosis not present

## 2017-11-05 DIAGNOSIS — D649 Anemia, unspecified: Secondary | ICD-10-CM

## 2017-11-05 DIAGNOSIS — Z85820 Personal history of malignant melanoma of skin: Secondary | ICD-10-CM | POA: Diagnosis not present

## 2017-11-05 NOTE — Telephone Encounter (Signed)
Gave avs and calendar for july °

## 2017-11-05 NOTE — Progress Notes (Signed)
[facility] New Patient Consult   Referring MD: Jovita Kussmaul, Md [place], [place] [address]   Frances Ortiz 84 y.o.  [birth year]    Reason for Referral: Colon cancer   HPI: Frances Ortiz reports an episode of blood in the stool.  She saw Dr. Alroy Dust and was found to have blood in the stool.  She was referred to Dr. Penelope Coop and underwent a colonoscopy 09/16/2017.  Diverticula were found in the sigmoid and descending colon.  A nonobstructing mass was found in the cecum and involved the ileocecal valve.  The mass was biopsied and tattooed.  A CT of the abdomen and pelvis revealed a 9 mm subcapsular left lobe hypoattenuating lesion, too small to characterize.  A cecal mass was noted with no evidence of obstruction.  Tumor may extend into the base of the appendix.  Subcentimeter lymph nodes in the cecal mesentery.  No pathologic lymphadenopathy.  1.6 cm indeterminate right adrenal nodule.  She was referred to Dr. Marlou Starks and was taken to a laparoscopic right colectomy on 10/07/2017.  The pathology ([id number]) revealed a moderately differentiated adenocarcinoma of the cecum.  Tumor invaded into pericolonic soft tissue.  Lymphovascular and perineural invasion were identified.  The resection margins were negative.  20 lymph nodes were negative for metastatic carcinoma.  There was a separate tubular adenoma and the appendix contained a serrated adenoma.  No tumor deposits.  The tumor returned MSI-stable with no loss of mismatch repair protein expression.  She reports rectal urgency following surgery.  Past Medical History:  Diagnosis Date  . Anxiety   . GERD (gastroesophageal reflux disease)   . Heart murmur   . HLD (hyperlipidemia)   . Intestinal obstruction ([place])   . Osteoporosis   . PONV (postoperative nausea and vomiting)   .  Melanoma left upper back  age 29  . Melanoma-parietal scalp, August 2015-stage IIb versus 3 see    .   G2 P2   .  Right leg  DVT and 1964 while pregnant   .  Multiple basal cell carcinomas  Past Surgical History:  Procedure Laterality Date  . BREAST BIOPSY     bilateral  . BREAST EXCISIONAL BIOPSY Right 30 years ago   fibroadenoma  . BREAST EXCISIONAL BIOPSY Left 40 years ago   sclerosis thickening  . CATARACT EXTRACTION W/ INTRAOCULAR LENS  IMPLANT, BILATERAL Bilateral   . CESAREAN SECTION     x 2  . CHOLECYSTECTOMY N/A 11/27/2012   Procedure: LAPAROSCOPIC CHOLECYSTECTOMY WITH INTRAOPERATIVE CHOLANGIOGRAM;  Surgeon: Merrie Roof, MD;  Location: [facility];  Service: General;  Laterality: N/A;  . COLECTOMY Right 10/07/2017   right  . COLONOSCOPY  2005  . COLONOSCOPY  08/2017   "found mass in cecum"  . DILATION AND CURETTAGE OF UTERUS    . ESOPHAGOGASTRODUODENOSCOPY    . LAPAROSCOPIC RIGHT COLECTOMY Right 10/07/2017   Procedure: LAPAROSCOPIC RIGHT COLECTOMY ERAS PATHWAY;  Surgeon: Jovita Kussmaul, MD;  Location: [facility];  Service: General;  Laterality: Right;  . SKIN CANCER EXCISION    . TONSILLECTOMY      Medications: Reviewed  Allergies:  Allergies  Allergen Reactions  . Aristocort [Triamcinolone] Other (See Comments)    Emotional instability  . Cortisone Other (See Comments)    Emotional instability  . Erythromycin Other (See Comments)    Stomach pain   . Other Other (See Comments)    Epinephrine in novocain makes heart race.  Marland Kitchen  Hydrocodone-Acetaminophen Rash    Family history: A brother had basal cell carcinoma.  A sister had basal cell carcinoma.  No other family history of cancer.  Social History:   She lives alone in [place].  She is retired.  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  She does not use cigarettes or alcohol.  No transfusion history.  No risk factor for HIV or hepatitis  ROS:   Positives include: Blood in the stool  A complete ROS was otherwise negative.  Physical Exam:  Blood pressure (!) 147/72, pulse (!) 107,  temperature 98.4 F (36.9 C), temperature source Oral, resp. rate 18, height _0  (1.499 m), weight 137 lb 3.2 oz (62.2 kg), SpO2 100 %.  HEENT: Oropharynx without visible mass, neck without mass Lungs: Clear bilaterally Cardiac: Regular rate and rhythm Abdomen: No hepatosplenomegaly, no mass, healed surgical incision  Vascular: No leg edema Lymph nodes: No cervical, supraclavicular, axillary, or inguinal nodes Neurologic: Alert and oriented, the motor exam appears intact in the upper and lower extremities Skin: Dry erythematous rash over the trunk, multiple benign-appearing moles over the trunk, scalp wide excision site and left upper back scars without evidence of recurrent tumor Musculoskeletal: No spine tenderness   LAB:  CBC  Lab Results  Component Value Date   WBC 8.2 10/10/2017   HGB 10.3 (L) 10/10/2017   HCT 32.5 (L) 10/10/2017   MCV 86.9 10/10/2017   PLT 346 10/10/2017   NEUTROABS 5.5 10/10/2017        Lab Results  Component Value Date   CEA1 1.4 10/03/2017    Imaging:  As per HPI, CT abdomen/pelvis 10/02/2017-images reviewed   Assessment/Plan:   1. Adenocarcinoma of the cecum, status post a laparoscopic right colectomy 10/07/2017  Stage II (T3N0), lymphovascular and perineural invasion present, 0/20 lymph nodes positive, MSI-stable, no loss of mismatch repair protein expression  CT abdomen/pelvis 10/02/2017-cecal mass, small lymph nodes in the cecal mesentery, 1.6 cm right adrenal nodule, 9 mm indeterminate left liver lesion 2. Anemia-likely secondary to bleeding from the cecum tumor  3.   History of melanoma  Melanoma at the left upper back at age 23  Parietal scalp melanoma 2015, status post wide excision and plastic surgery at [facility]  4.   Ascending colon polyp, tubular adenoma, identified on the right colectomy specimen 10/07/2017   Disposition:   Frances Ortiz has been diagnosed with stage II colon cancer.  She has a good prognosis for a  long-term disease-free survival.  We reviewed the details of the surgical pathology report, prognosis, and adjuvant treatment options.  I explained the small potential absolute benefit associated with adjuvant 5-fluorouracil based chemotherapy in this setting.  I do not recommend adjuvant chemotherapy.  She is comfortable with observation.  She does not appear to have hereditary non-polyposis colon cancer syndrome, but her family members are at increased risk of developing colorectal cancer.  She will be sure they are aware to receive appropriate screening.  I discussed the standard practice is to complete a 1 year post operative surveillance colonoscopy.  She will discussed the indication for further colonoscopy screening with Dr. Narda Amber.  The right adrenal nodule has been present on previous CTs at [facility].  A hypodensity in the left liver has also been mentioned on previous CTs at [facility].  She is followed with serial CT scans for melanoma surveillance at [facility].  The indeterminate lesions noted on the 10/02/2017 CT are likely  benign.  Frances Ortiz would like to continue follow-up at the Cancer center she will return for an office visit and CEA in 6 months.  50 minutes were spent with the patient today.  The majority of the time was used for counseling and coordination of care.  Betsy Coder, MD  11/05/2017, 9:21 AM

## 2017-11-29 DIAGNOSIS — C434 Malignant melanoma of scalp and neck: Secondary | ICD-10-CM | POA: Diagnosis not present

## 2017-12-16 ENCOUNTER — Other Ambulatory Visit: Payer: Self-pay

## 2017-12-16 DIAGNOSIS — D485 Neoplasm of uncertain behavior of skin: Secondary | ICD-10-CM | POA: Diagnosis not present

## 2017-12-16 DIAGNOSIS — C44629 Squamous cell carcinoma of skin of left upper limb, including shoulder: Secondary | ICD-10-CM | POA: Diagnosis not present

## 2018-02-10 DIAGNOSIS — F322 Major depressive disorder, single episode, severe without psychotic features: Secondary | ICD-10-CM | POA: Diagnosis not present

## 2018-02-10 DIAGNOSIS — M810 Age-related osteoporosis without current pathological fracture: Secondary | ICD-10-CM | POA: Diagnosis not present

## 2018-02-10 DIAGNOSIS — K219 Gastro-esophageal reflux disease without esophagitis: Secondary | ICD-10-CM | POA: Diagnosis not present

## 2018-02-10 DIAGNOSIS — Z0000 Encounter for general adult medical examination without abnormal findings: Secondary | ICD-10-CM | POA: Diagnosis not present

## 2018-03-03 DIAGNOSIS — B079 Viral wart, unspecified: Secondary | ICD-10-CM | POA: Diagnosis not present

## 2018-03-03 DIAGNOSIS — L570 Actinic keratosis: Secondary | ICD-10-CM | POA: Diagnosis not present

## 2018-03-03 DIAGNOSIS — D485 Neoplasm of uncertain behavior of skin: Secondary | ICD-10-CM | POA: Diagnosis not present

## 2018-03-03 DIAGNOSIS — L905 Scar conditions and fibrosis of skin: Secondary | ICD-10-CM | POA: Diagnosis not present

## 2018-03-03 DIAGNOSIS — Z85828 Personal history of other malignant neoplasm of skin: Secondary | ICD-10-CM | POA: Diagnosis not present

## 2018-04-11 DIAGNOSIS — R911 Solitary pulmonary nodule: Secondary | ICD-10-CM | POA: Diagnosis not present

## 2018-04-11 DIAGNOSIS — C434 Malignant melanoma of scalp and neck: Secondary | ICD-10-CM | POA: Diagnosis not present

## 2018-05-13 ENCOUNTER — Inpatient Hospital Stay: Payer: Medicare Other | Attending: Oncology | Admitting: Oncology

## 2018-05-13 ENCOUNTER — Inpatient Hospital Stay: Payer: Medicare Other

## 2018-05-13 ENCOUNTER — Telehealth: Payer: Self-pay | Admitting: Oncology

## 2018-05-13 VITALS — BP 150/66 | HR 71 | Temp 98.4°F | Resp 17 | Ht 59.0 in | Wt 124.1 lb

## 2018-05-13 DIAGNOSIS — C180 Malignant neoplasm of cecum: Secondary | ICD-10-CM

## 2018-05-13 DIAGNOSIS — E279 Disorder of adrenal gland, unspecified: Secondary | ICD-10-CM | POA: Diagnosis not present

## 2018-05-13 DIAGNOSIS — R194 Change in bowel habit: Secondary | ICD-10-CM | POA: Insufficient documentation

## 2018-05-13 DIAGNOSIS — Z85820 Personal history of malignant melanoma of skin: Secondary | ICD-10-CM | POA: Diagnosis not present

## 2018-05-13 DIAGNOSIS — D649 Anemia, unspecified: Secondary | ICD-10-CM

## 2018-05-13 LAB — CEA (IN HOUSE-CHCC): CEA (CHCC-IN HOUSE): 1.12 ng/mL (ref 0.00–5.00)

## 2018-05-13 NOTE — Progress Notes (Signed)
  [facility] OFFICE PROGRESS NOTE   Diagnosis: Colon cancer  INTERVAL HISTORY:   Ms. Blumenthal returns as scheduled.  She feels well.  Restaging CTs at [facility] on 04/11/2018 revealed no evidence of metastatic disease. She reports intentional weight loss with dieting.  She has noted frequent soft bowel movements since undergoing colon surgery.  Objective:  Vital signs in last 24 hours:  Blood pressure (!) 150/66, pulse 71, temperature 98.4 F (36.9 C), temperature source Oral, resp. rate 17, height '4\' 11"'$  (1.499 m), weight 124 lb 1.6 oz (56.3 kg), SpO2 100 %.    HEENT: Neck without mass Lymphatics: No cervical, supraclavicular, axillary, or inguinal nodes Resp: Lungs clear bilaterally Cardio: Regular rate and rhythm GI: No hepatosplenomegaly, nontender, no mass Vascular: No leg edema  Skin: Parietal scalp excision site without evidence of recurrent tumor    Lab Results:  Lab Results  Component Value Date   WBC 8.2 10/10/2017   HGB 10.3 (L) 10/10/2017   HCT 32.5 (L) 10/10/2017   MCV 86.9 10/10/2017   PLT 346 10/10/2017   NEUTROABS 5.5 10/10/2017    CMP  Lab Results  Component Value Date   NA 137 10/10/2017   K 3.3 (L) 10/10/2017   CL 103 10/10/2017   CO2 25 10/10/2017   GLUCOSE 110 (H) 10/10/2017   BUN 8 10/10/2017   CREATININE 0.60 10/10/2017   CALCIUM 8.7 (L) 10/10/2017   PROT 7.2 10/03/2017   ALBUMIN 3.9 10/03/2017   AST 30 10/03/2017   ALT 22 10/03/2017   ALKPHOS 81 10/03/2017   BILITOT 0.4 10/03/2017   GFRNONAA >60 10/10/2017   GFRAA >60 10/10/2017    Lab Results  Component Value Date   CEA1 1.12 05/13/2018    Medications: I have reviewed the patient's current medications.   Assessment/Plan: 1. Adenocarcinoma of the cecum, status post a laparoscopic right colectomy 10/07/2017 ? Stage II (T3N0), lymphovascular and perineural invasion present, 0/20 lymph nodes positive, MSI-stable, no loss of mismatch repair protein  expression ? CT abdomen/pelvis 10/02/2017-cecal mass, small lymph nodes in the cecal mesentery, 1.6 cm right adrenal nodule, 9 mm indeterminate left liver lesion ? [facility] at [facility] 04/11/2018-negative for metastatic disease, stable right adrenal nodule 2. Anemia-likely secondary to bleeding from the cecum tumor  3.   History of melanoma  Melanoma at the left upper back at age 65  Parietal scalp melanoma 2015, status post wide excision and plastic surgery at [facility]  4.   Ascending colon polyp, tubular adenoma, identified on the right colectomy specimen 10/07/2017   Disposition: Ms. Dillenbeck is in clinical remission from colon cancer.  She will return for an office visit and CEA in 6 months.  15 minutes were spent with the patient today.  The majority of the time was used for counseling and coordination of care.  Betsy Coder, MD  05/13/2018  1:38 PM

## 2018-05-13 NOTE — Telephone Encounter (Signed)
Scheduled appt per 7/23 los - pt is aware - no print out wanted.

## 2018-05-14 ENCOUNTER — Telehealth: Payer: Self-pay | Admitting: Emergency Medicine

## 2018-05-14 NOTE — Telephone Encounter (Addendum)
VM left for pt to call back   ----- Message from Ladell Pier, MD sent at 05/13/2018  4:10 PM EDT ----- Please call patient, cea is normal

## 2018-06-10 DIAGNOSIS — C180 Malignant neoplasm of cecum: Secondary | ICD-10-CM | POA: Diagnosis not present

## 2018-09-25 DIAGNOSIS — L819 Disorder of pigmentation, unspecified: Secondary | ICD-10-CM | POA: Diagnosis not present

## 2018-09-25 DIAGNOSIS — D1801 Hemangioma of skin and subcutaneous tissue: Secondary | ICD-10-CM | POA: Diagnosis not present

## 2018-09-25 DIAGNOSIS — L821 Other seborrheic keratosis: Secondary | ICD-10-CM | POA: Diagnosis not present

## 2018-09-25 DIAGNOSIS — D229 Melanocytic nevi, unspecified: Secondary | ICD-10-CM | POA: Diagnosis not present

## 2018-09-30 DIAGNOSIS — Z23 Encounter for immunization: Secondary | ICD-10-CM | POA: Diagnosis not present

## 2018-10-10 DIAGNOSIS — C189 Malignant neoplasm of colon, unspecified: Secondary | ICD-10-CM | POA: Diagnosis not present

## 2018-10-10 DIAGNOSIS — Z9889 Other specified postprocedural states: Secondary | ICD-10-CM | POA: Diagnosis not present

## 2018-10-10 DIAGNOSIS — C434 Malignant melanoma of scalp and neck: Secondary | ICD-10-CM | POA: Diagnosis not present

## 2018-10-10 DIAGNOSIS — Z85820 Personal history of malignant melanoma of skin: Secondary | ICD-10-CM | POA: Diagnosis not present

## 2018-10-30 ENCOUNTER — Ambulatory Visit
Admission: RE | Admit: 2018-10-30 | Discharge: 2018-10-30 | Disposition: A | Discharge status: Home / Self Care | Payer: Medicare Other | Source: Ambulatory Visit | Attending: Family Medicine | Admitting: Family Medicine

## 2018-10-30 ENCOUNTER — Other Ambulatory Visit: Payer: Self-pay | Admitting: Family Medicine

## 2018-10-30 DIAGNOSIS — Z1231 Encounter for screening mammogram for malignant neoplasm of breast: Secondary | ICD-10-CM | POA: Diagnosis not present

## 2018-11-13 ENCOUNTER — Telehealth: Payer: Self-pay

## 2018-11-13 ENCOUNTER — Inpatient Hospital Stay: Payer: Medicare Other | Attending: Oncology | Admitting: Oncology

## 2018-11-13 ENCOUNTER — Inpatient Hospital Stay: Payer: Medicare Other

## 2018-11-13 VITALS — BP 139/71 | HR 71 | Temp 98.5°F | Resp 18 | Wt 123.4 lb

## 2018-11-13 DIAGNOSIS — C180 Malignant neoplasm of cecum: Secondary | ICD-10-CM | POA: Diagnosis not present

## 2018-11-13 DIAGNOSIS — D649 Anemia, unspecified: Secondary | ICD-10-CM

## 2018-11-13 DIAGNOSIS — E279 Disorder of adrenal gland, unspecified: Secondary | ICD-10-CM | POA: Diagnosis not present

## 2018-11-13 DIAGNOSIS — R197 Diarrhea, unspecified: Secondary | ICD-10-CM

## 2018-11-13 DIAGNOSIS — Z85820 Personal history of malignant melanoma of skin: Secondary | ICD-10-CM

## 2018-11-13 LAB — CEA (IN HOUSE-CHCC): CEA (CHCC-IN HOUSE): 1.66 ng/mL (ref 0.00–5.00)

## 2018-11-13 NOTE — Telephone Encounter (Signed)
Left voicemail for pt to call back [facility] 

## 2018-11-13 NOTE — Telephone Encounter (Signed)
TC to pt per Dr Benay Spice to let her know that her cea is normal, and to follow up as scheduled. Pt verbalized understanding. No further problems or concerns at this time.

## 2018-11-13 NOTE — Progress Notes (Addendum)
  [facility] OFFICE PROGRESS NOTE   Diagnosis: Colon cancer, melanoma  INTERVAL HISTORY:   Ms. Sills returns as scheduled.  She feels well she reports intentional weight loss with dieting.  She has more frequent bowel movements since undergoing colon surgery.  She had 2 episodes of urgency and diarrhea. She was at [facility] for follow-up of melanoma on 10/10/2018.  There was no evidence of recurrent melanoma.  Objective:  Vital signs in last 24 hours:  Blood pressure 139/71, pulse 71, temperature 98.5 F (36.9 C), temperature source Oral, resp. rate 18, weight 123 lb 6.4 oz (56 kg), SpO2 100 %.    HEENT: Neck without mass Lymphatics: No cervical, supraclavicular, axillary, or inguinal nodes Resp: Lungs clear bilaterally Cardio: Regular rate and rhythm, 2/6 diastolic murmur GI: No hepatosplenomegaly, no mass, nontender Vascular: No leg edema  Skin: No evidence for recurrent tumor at the left upper back or parietal scars   Lab Results:   Lab Results  Component Value Date   CEA1 1.12 05/13/2018     Medications: I have reviewed the patient's current medications.   Assessment/Plan: 1. Adenocarcinoma of the cecum, status post a laparoscopic right colectomy 10/07/2017 ? Stage II (T3N0), lymphovascular and perineural invasion present, 0/20 lymph nodes positive, MSI-stable, no loss of mismatch repair protein expression ? CT abdomen/pelvis 10/02/2017-cecal mass, small lymph nodes in the cecal mesentery, 1.6 cm right adrenal nodule, 9 mm indeterminate left liver lesion ? [facility] at [facility] 04/11/2018-negative for metastatic disease, stable right adrenal nodule ? [facility] at [facility] 10/10/2018-negative for recurrent disease, stable adrenal nodule 2. Anemia-likely secondary to bleeding from the cecum tumor  3.   History of melanoma  Melanoma at the left upper back at age 85  Parietal scalp melanoma 2015, status post wide excision and plastic surgery at [facility]  4.   Ascending colon polyp, tubular adenoma, identified on the right colectomy specimen 10/07/2017    Disposition: Ms. Holzworth is in clinical remission from colon cancer and melanoma.  We will follow-up on the CEA from today.  She will return for an office visit and CEA in 6 months.  She is scheduled for follow-up at [facility] in August 2020. 15 minutes were spent with the patient today.  The majority of the time was used for counseling and coordination of care.  Betsy Coder, MD  11/13/2018  11:44 AM

## 2018-11-13 NOTE — Telephone Encounter (Signed)
Printed avs and calender of upcoming appointment. Per 12/3 los 

## 2018-12-18 DIAGNOSIS — K5730 Diverticulosis of large intestine without perforation or abscess without bleeding: Secondary | ICD-10-CM | POA: Diagnosis not present

## 2018-12-18 DIAGNOSIS — Z85038 Personal history of other malignant neoplasm of large intestine: Secondary | ICD-10-CM | POA: Diagnosis not present

## 2019-03-04 DIAGNOSIS — F322 Major depressive disorder, single episode, severe without psychotic features: Secondary | ICD-10-CM | POA: Diagnosis not present

## 2019-03-04 DIAGNOSIS — Z0000 Encounter for general adult medical examination without abnormal findings: Secondary | ICD-10-CM | POA: Diagnosis not present

## 2019-03-04 DIAGNOSIS — M810 Age-related osteoporosis without current pathological fracture: Secondary | ICD-10-CM | POA: Diagnosis not present

## 2019-05-14 ENCOUNTER — Inpatient Hospital Stay: Payer: Medicare Other

## 2019-05-14 ENCOUNTER — Other Ambulatory Visit: Payer: Self-pay

## 2019-05-14 ENCOUNTER — Telehealth: Payer: Self-pay | Admitting: Oncology

## 2019-05-14 ENCOUNTER — Inpatient Hospital Stay: Payer: Medicare Other | Attending: Oncology | Admitting: Oncology

## 2019-05-14 ENCOUNTER — Telehealth: Payer: Self-pay | Admitting: *Deleted

## 2019-05-14 VITALS — BP 133/68 | HR 75 | Temp 97.7°F | Resp 18 | Wt 126.4 lb

## 2019-05-14 DIAGNOSIS — Z85820 Personal history of malignant melanoma of skin: Secondary | ICD-10-CM

## 2019-05-14 DIAGNOSIS — E279 Disorder of adrenal gland, unspecified: Secondary | ICD-10-CM

## 2019-05-14 DIAGNOSIS — C180 Malignant neoplasm of cecum: Secondary | ICD-10-CM

## 2019-05-14 DIAGNOSIS — R197 Diarrhea, unspecified: Secondary | ICD-10-CM | POA: Diagnosis not present

## 2019-05-14 LAB — CEA (IN HOUSE-CHCC): CEA (CHCC-In House): 1.22 ng/mL (ref 0.00–5.00)

## 2019-05-14 NOTE — Telephone Encounter (Signed)
Scheduled appt per 7/23 LOS - gave pt AVS and calender per los,.

## 2019-05-14 NOTE — Progress Notes (Signed)
  [facility] OFFICE PROGRESS NOTE   Diagnosis: Colon cancer  INTERVAL HISTORY:   Frances Ortiz returns as scheduled.  She feels well.  Good appetite.  She has frequent diarrhea.  This is been present since she underwent colon surgery.  She underwent restaging CTs at [facility] on 10/10/2018 CTs revealed no evidence of metastatic disease.  Objective:  Vital signs in last 24 hours:  Blood pressure 133/68, pulse 75, temperature 97.7 F (36.5 C), temperature source Oral, resp. rate 18, weight 126 lb 6.4 oz (57.3 kg), SpO2 98 %.    HEENT: Parietal scalp surgical defect without evidence of recurrent tumor Lymphatics: No cervical, supraclavicular, axillary, or inguinal nodes GI: Nontender, no hepatosplenomegaly Vascular: No leg edema      Lab Results  Component Value Date   CEA1 1.22 05/14/2019     Medications: I have reviewed the patient's current medications.   Assessment/Plan: 1. Adenocarcinoma of the cecum, status post a laparoscopic right colectomy 10/07/2017 ? Stage II (T3N0), lymphovascular and perineural invasion present, 0/20 lymph nodes positive, MSI-stable, no loss of mismatch repair protein expression ? CT abdomen/pelvis 10/02/2017-cecal mass, small lymph nodes in the cecal mesentery, 1.6 cm right adrenal nodule, 9 mm indeterminate left liver lesion ? CTs at [facility] 04/11/2018-negative for metastatic disease, stable right adrenal nodule ? CTs at [facility] 10/10/2018-negative for recurrent disease, stable adrenal nodule 2. History of anemia-likely secondary to bleeding from the cecum tumor  3.   History of melanoma  Melanoma at the left upper back at age 15  Parietal scalp melanoma 2015, status post wide excision and plastic surgery at [facility]  4.   Ascending colon polyp, tubular adenoma, identified on the right colectomy specimen 10/07/2017  Colonoscopy 12/18/2018- diverticulosis, no polyps     Disposition: Frances Ortiz is in  remission from colon cancer and melanoma.  She is scheduled for follow-up in surgical oncology at [facility] next month.  She will return for an office visit and CEA in 6 months.  Betsy Coder, MD  05/14/2019  1:41 PM

## 2019-05-14 NOTE — Telephone Encounter (Signed)
-----   Message from Ladell Pier, MD sent at 05/14/2019  1:54 PM EDT ----- Please call patient, CEA is normal

## 2019-05-14 NOTE — Telephone Encounter (Signed)
Notified of normal CEA. 

## 2019-05-19 DIAGNOSIS — C180 Malignant neoplasm of cecum: Secondary | ICD-10-CM | POA: Diagnosis not present

## 2019-06-12 DIAGNOSIS — C434 Malignant melanoma of scalp and neck: Secondary | ICD-10-CM | POA: Diagnosis not present

## 2019-06-12 DIAGNOSIS — K7689 Other specified diseases of liver: Secondary | ICD-10-CM | POA: Diagnosis not present

## 2019-06-12 DIAGNOSIS — R918 Other nonspecific abnormal finding of lung field: Secondary | ICD-10-CM | POA: Diagnosis not present

## 2019-06-22 DIAGNOSIS — Z85820 Personal history of malignant melanoma of skin: Secondary | ICD-10-CM | POA: Diagnosis not present

## 2019-06-22 DIAGNOSIS — C4441 Basal cell carcinoma of skin of scalp and neck: Secondary | ICD-10-CM | POA: Diagnosis not present

## 2019-06-22 DIAGNOSIS — L570 Actinic keratosis: Secondary | ICD-10-CM | POA: Diagnosis not present

## 2019-06-22 DIAGNOSIS — Z85828 Personal history of other malignant neoplasm of skin: Secondary | ICD-10-CM | POA: Diagnosis not present

## 2019-06-22 DIAGNOSIS — L820 Inflamed seborrheic keratosis: Secondary | ICD-10-CM | POA: Diagnosis not present

## 2019-06-22 DIAGNOSIS — D485 Neoplasm of uncertain behavior of skin: Secondary | ICD-10-CM | POA: Diagnosis not present

## 2019-07-10 DIAGNOSIS — L821 Other seborrheic keratosis: Secondary | ICD-10-CM | POA: Diagnosis not present

## 2019-07-10 DIAGNOSIS — L814 Other melanin hyperpigmentation: Secondary | ICD-10-CM | POA: Diagnosis not present

## 2019-07-10 DIAGNOSIS — D1801 Hemangioma of skin and subcutaneous tissue: Secondary | ICD-10-CM | POA: Diagnosis not present

## 2019-07-10 DIAGNOSIS — L570 Actinic keratosis: Secondary | ICD-10-CM | POA: Diagnosis not present

## 2019-07-23 DIAGNOSIS — H524 Presbyopia: Secondary | ICD-10-CM | POA: Diagnosis not present

## 2019-07-23 DIAGNOSIS — H52203 Unspecified astigmatism, bilateral: Secondary | ICD-10-CM | POA: Diagnosis not present

## 2019-07-23 DIAGNOSIS — Z961 Presence of intraocular lens: Secondary | ICD-10-CM | POA: Diagnosis not present

## 2019-07-23 DIAGNOSIS — H02825 Cysts of left lower eyelid: Secondary | ICD-10-CM | POA: Diagnosis not present

## 2019-08-06 DIAGNOSIS — C4441 Basal cell carcinoma of skin of scalp and neck: Secondary | ICD-10-CM | POA: Diagnosis not present

## 2019-09-29 DIAGNOSIS — L570 Actinic keratosis: Secondary | ICD-10-CM | POA: Diagnosis not present

## 2019-09-29 DIAGNOSIS — Z23 Encounter for immunization: Secondary | ICD-10-CM | POA: Diagnosis not present

## 2019-11-16 ENCOUNTER — Inpatient Hospital Stay: Payer: Medicare Other | Admitting: Oncology

## 2019-11-16 ENCOUNTER — Inpatient Hospital Stay: Payer: Medicare Other

## 2019-11-18 ENCOUNTER — Other Ambulatory Visit: Payer: Self-pay | Admitting: Family Medicine

## 2019-11-18 ENCOUNTER — Telehealth: Payer: Self-pay | Admitting: Oncology

## 2019-11-18 DIAGNOSIS — Z1231 Encounter for screening mammogram for malignant neoplasm of breast: Secondary | ICD-10-CM

## 2019-11-18 NOTE — Telephone Encounter (Signed)
Scheduled appt per 12/5 sch message - pt is aware of appt date and time   

## 2019-12-08 ENCOUNTER — Telehealth: Payer: Self-pay

## 2019-12-08 ENCOUNTER — Inpatient Hospital Stay: Payer: Medicare Other

## 2019-12-08 ENCOUNTER — Other Ambulatory Visit: Payer: Self-pay

## 2019-12-08 ENCOUNTER — Inpatient Hospital Stay: Payer: Medicare Other | Attending: Oncology | Admitting: Oncology

## 2019-12-08 VITALS — BP 142/70 | HR 81 | Temp 98.2°F | Resp 18 | Ht 59.0 in | Wt 130.0 lb

## 2019-12-08 DIAGNOSIS — C180 Malignant neoplasm of cecum: Secondary | ICD-10-CM

## 2019-12-08 DIAGNOSIS — E279 Disorder of adrenal gland, unspecified: Secondary | ICD-10-CM | POA: Diagnosis not present

## 2019-12-08 DIAGNOSIS — Z85820 Personal history of malignant melanoma of skin: Secondary | ICD-10-CM | POA: Insufficient documentation

## 2019-12-08 LAB — CEA (IN HOUSE-CHCC): CEA (CHCC-In House): 1.2 ng/mL (ref 0.00–5.00)

## 2019-12-08 NOTE — Progress Notes (Signed)
  [place] OFFICE PROGRESS NOTE   Diagnosis: Colon cancer  INTERVAL HISTORY:   Frances Ortiz returns for a scheduled visit.  She feels well.  Good appetite.  No difficulty with bowel function.  No new complaint.  She has received both doses of the COVID-19 vaccine.  Surveillance imaging at [facility] on 06/12/2019.  There was no evidence of metastatic disease.  There has been increased size of biliary duct radicles in the anterior right liver.  Objective:  Vital signs in last 24 hours:  Blood pressure (!) 142/70, pulse 81, temperature 98.2 F (36.8 C), temperature source Temporal, resp. rate 18, height _0  (1.499 m), weight 130 lb (59 kg), SpO2 98 %.   Limited physical examination secondary to distancing with the Covid pandemic Lymphatics: No cervical, supraclavicular, axillary, or inguinal nodes GI: No hepatosplenomegaly, no mass, nontender Vascular: No leg edema  Skin: Parietal scalp and left back scars without evidence of recurrent tumor   Lab Results:  Lab Results  Component Value Date   WBC 8.2 10/10/2017   HGB 10.3 (L) 10/10/2017   HCT 32.5 (L) 10/10/2017   MCV 86.9 10/10/2017   PLT 346 10/10/2017   NEUTROABS 5.5 10/10/2017    CMP  Lab Results  Component Value Date   NA 137 10/10/2017   K 3.3 (L) 10/10/2017   CL 103 10/10/2017   CO2 25 10/10/2017   GLUCOSE 110 (H) 10/10/2017   BUN 8 10/10/2017   CREATININE 0.60 10/10/2017   CALCIUM 8.7 (L) 10/10/2017   PROT 7.2 10/03/2017   ALBUMIN 3.9 10/03/2017   AST 30 10/03/2017   ALT 22 10/03/2017   ALKPHOS 81 10/03/2017   BILITOT 0.4 10/03/2017   GFRNONAA >60 10/10/2017   GFRAA >60 10/10/2017    Lab Results  Component Value Date   CEA1 1.22 05/14/2019     Medications: I have reviewed the patient's current medications.   Assessment/Plan: 1. Adenocarcinoma of the cecum, status post a laparoscopic right colectomy 10/07/2017 ? Stage II (T3N0), lymphovascular and perineural invasion present,  0/20 lymph nodes positive, MSI-stable, no loss of mismatch repair protein expression ? CT abdomen/pelvis 10/02/2017-cecal mass, small lymph nodes in the cecal mesentery, 1.6 cm right adrenal nodule, 9 mm indeterminate left liver lesion ? CTs at [facility] 04/11/2018-negative for metastatic disease, stable right adrenal nodule ? CTs at [facility] 10/10/2018-negative for recurrent disease, stable adrenal nodule ? CTs at [facility] thousand 20-no evidence of metastatic disease, increased size of biliary ductal radicles at the dome of the right liver, stable right adrenal nodule 2. History of anemia-likely secondary to bleeding from the cecum tumor  3.   History of melanoma  Melanoma at the left upper back at age 67  Parietal scalp melanoma 2015, status post wide excision and plastic surgery at [facility]  4.   Ascending colon polyp, tubular adenoma, identified on the right colectomy specimen 10/07/2017  Colonoscopy 12/18/2018- diverticulosis, no polyps   Disposition: Frances Ortiz is in clinical remission from colon cancer.  She will return for an office visit and CEA in 6 months.  We will follow the CEA from today.  We discussed the indication for additional surveillance imaging.  She is now greater than 2 years out from the diagnosis of stage II colon cancer.  I do not recommend further imaging.  She remains undecided on whether she will undergo additional colonoscopy surveillance.  Betsy Coder, MD  12/08/2019  10:23 AM

## 2019-12-08 NOTE — Telephone Encounter (Signed)
-----   Message from Ladell Pier, MD sent at 12/08/2019  1:23 PM EST ----- Please call patient, cea Is normal

## 2019-12-08 NOTE — Telephone Encounter (Signed)
VM message left to inform Pt. That CEA results were normal. Informed Pt. If she had any problems or concerns to return call to [facility].

## 2019-12-10 ENCOUNTER — Telehealth: Payer: Self-pay | Admitting: Oncology

## 2019-12-10 NOTE — Telephone Encounter (Signed)
Scheduled per los. Called and left msg. Mailed printout  °

## 2019-12-25 ENCOUNTER — Other Ambulatory Visit: Payer: Self-pay

## 2019-12-25 ENCOUNTER — Ambulatory Visit
Admission: RE | Admit: 2019-12-25 | Discharge: 2019-12-25 | Disposition: A | Discharge status: Home / Self Care | Payer: Medicare Other | Source: Ambulatory Visit | Attending: Family Medicine | Admitting: Family Medicine

## 2019-12-25 DIAGNOSIS — Z1231 Encounter for screening mammogram for malignant neoplasm of breast: Secondary | ICD-10-CM

## 2020-01-08 DIAGNOSIS — C44329 Squamous cell carcinoma of skin of other parts of face: Secondary | ICD-10-CM | POA: Diagnosis not present

## 2020-01-08 DIAGNOSIS — D225 Melanocytic nevi of trunk: Secondary | ICD-10-CM | POA: Diagnosis not present

## 2020-01-08 DIAGNOSIS — L570 Actinic keratosis: Secondary | ICD-10-CM | POA: Diagnosis not present

## 2020-01-08 DIAGNOSIS — D1801 Hemangioma of skin and subcutaneous tissue: Secondary | ICD-10-CM | POA: Diagnosis not present

## 2020-01-08 DIAGNOSIS — L814 Other melanin hyperpigmentation: Secondary | ICD-10-CM | POA: Diagnosis not present

## 2020-01-08 DIAGNOSIS — L821 Other seborrheic keratosis: Secondary | ICD-10-CM | POA: Diagnosis not present

## 2020-02-18 DIAGNOSIS — C44329 Squamous cell carcinoma of skin of other parts of face: Secondary | ICD-10-CM | POA: Diagnosis not present

## 2020-02-18 DIAGNOSIS — C44320 Squamous cell carcinoma of skin of unspecified parts of face: Secondary | ICD-10-CM | POA: Diagnosis not present

## 2020-03-07 DIAGNOSIS — Z0000 Encounter for general adult medical examination without abnormal findings: Secondary | ICD-10-CM | POA: Diagnosis not present

## 2020-03-07 DIAGNOSIS — F322 Major depressive disorder, single episode, severe without psychotic features: Secondary | ICD-10-CM | POA: Diagnosis not present

## 2020-03-07 DIAGNOSIS — M810 Age-related osteoporosis without current pathological fracture: Secondary | ICD-10-CM | POA: Diagnosis not present

## 2020-03-07 DIAGNOSIS — M25562 Pain in left knee: Secondary | ICD-10-CM | POA: Diagnosis not present

## 2020-03-14 DIAGNOSIS — M810 Age-related osteoporosis without current pathological fracture: Secondary | ICD-10-CM | POA: Diagnosis not present

## 2020-04-13 DIAGNOSIS — M545 Low back pain: Secondary | ICD-10-CM | POA: Diagnosis not present

## 2020-05-12 DIAGNOSIS — C180 Malignant neoplasm of cecum: Secondary | ICD-10-CM | POA: Diagnosis not present

## 2020-06-07 ENCOUNTER — Other Ambulatory Visit: Payer: Self-pay

## 2020-06-07 ENCOUNTER — Inpatient Hospital Stay: Payer: Medicare Other | Attending: Oncology

## 2020-06-07 DIAGNOSIS — C180 Malignant neoplasm of cecum: Secondary | ICD-10-CM | POA: Diagnosis not present

## 2020-06-07 LAB — CEA (IN HOUSE-CHCC): CEA (CHCC-In House): 1.05 ng/mL (ref 0.00–5.00)

## 2020-06-08 ENCOUNTER — Telehealth: Payer: Self-pay | Admitting: Oncology

## 2020-06-08 ENCOUNTER — Telehealth: Payer: Self-pay | Admitting: *Deleted

## 2020-06-08 NOTE — Telephone Encounter (Signed)
Notified of norma CEA. She agrees to in-person visit in 1-2 months w/Dr. Benay Spice. Scheduling message sent.

## 2020-06-08 NOTE — Telephone Encounter (Signed)
-----   Message from Ladell Pier, MD sent at 06/08/2020  6:32 AM EDT ----- Please call patient, cea is normal, was supposed to have office visit, can reschedule or do virtual

## 2020-06-08 NOTE — Telephone Encounter (Signed)
Scheduled appt per 8/18 sch msg - pt is aware of appt date and time

## 2020-06-17 DIAGNOSIS — Z9049 Acquired absence of other specified parts of digestive tract: Secondary | ICD-10-CM | POA: Diagnosis not present

## 2020-06-17 DIAGNOSIS — Z85820 Personal history of malignant melanoma of skin: Secondary | ICD-10-CM | POA: Diagnosis not present

## 2020-06-17 DIAGNOSIS — C189 Malignant neoplasm of colon, unspecified: Secondary | ICD-10-CM | POA: Diagnosis not present

## 2020-06-17 DIAGNOSIS — C434 Malignant melanoma of scalp and neck: Secondary | ICD-10-CM | POA: Diagnosis not present

## 2020-06-17 DIAGNOSIS — Z08 Encounter for follow-up examination after completed treatment for malignant neoplasm: Secondary | ICD-10-CM | POA: Diagnosis not present

## 2020-07-07 DIAGNOSIS — L814 Other melanin hyperpigmentation: Secondary | ICD-10-CM | POA: Diagnosis not present

## 2020-07-07 DIAGNOSIS — D1801 Hemangioma of skin and subcutaneous tissue: Secondary | ICD-10-CM | POA: Diagnosis not present

## 2020-07-07 DIAGNOSIS — L821 Other seborrheic keratosis: Secondary | ICD-10-CM | POA: Diagnosis not present

## 2020-07-07 DIAGNOSIS — L820 Inflamed seborrheic keratosis: Secondary | ICD-10-CM | POA: Diagnosis not present

## 2020-07-07 DIAGNOSIS — D225 Melanocytic nevi of trunk: Secondary | ICD-10-CM | POA: Diagnosis not present

## 2020-07-11 ENCOUNTER — Ambulatory Visit
Admission: RE | Admit: 2020-07-11 | Discharge: 2020-07-11 | Disposition: A | Discharge status: Home / Self Care | Payer: Medicare Other | Source: Ambulatory Visit | Attending: Family Medicine | Admitting: Family Medicine

## 2020-07-11 ENCOUNTER — Other Ambulatory Visit: Payer: Self-pay

## 2020-07-11 DIAGNOSIS — Z23 Encounter for immunization: Secondary | ICD-10-CM | POA: Diagnosis not present

## 2020-07-11 DIAGNOSIS — M545 Low back pain, unspecified: Secondary | ICD-10-CM

## 2020-07-11 DIAGNOSIS — M47816 Spondylosis without myelopathy or radiculopathy, lumbar region: Secondary | ICD-10-CM | POA: Diagnosis not present

## 2020-07-11 DIAGNOSIS — M4316 Spondylolisthesis, lumbar region: Secondary | ICD-10-CM | POA: Diagnosis not present

## 2020-07-21 ENCOUNTER — Other Ambulatory Visit: Payer: Self-pay

## 2020-07-21 ENCOUNTER — Telehealth: Payer: Self-pay | Admitting: Oncology

## 2020-07-21 ENCOUNTER — Inpatient Hospital Stay: Payer: Medicare Other | Attending: Oncology | Admitting: Oncology

## 2020-07-21 VITALS — BP 140/67 | HR 85 | Temp 97.6°F | Resp 20 | Ht 59.0 in | Wt 130.4 lb

## 2020-07-21 DIAGNOSIS — Z85820 Personal history of malignant melanoma of skin: Secondary | ICD-10-CM | POA: Insufficient documentation

## 2020-07-21 DIAGNOSIS — R194 Change in bowel habit: Secondary | ICD-10-CM | POA: Diagnosis not present

## 2020-07-21 DIAGNOSIS — C180 Malignant neoplasm of cecum: Secondary | ICD-10-CM | POA: Insufficient documentation

## 2020-07-21 NOTE — Telephone Encounter (Signed)
Scheduled per 9/30 los. Printed avs and calendar for pt.  

## 2020-07-21 NOTE — Progress Notes (Signed)
  [place] OFFICE PROGRESS NOTE   Diagnosis: Colon cancer  INTERVAL HISTORY:   Ms. Furuya returns as scheduled.  She feels well.  She reports that she has been discharged from the surgical oncology clinic at [facility].  She continues to have frequent bowel movements.  No other complaint.  Objective:  Vital signs in last 24 hours:  Blood pressure 140/67, pulse 85, temperature 97.6 F (36.4 C), temperature source Tympanic, resp. rate 20, height $RemoveBe'4\' 11"'[id number]$  (1.499 m), weight 130 lb 6.4 oz (59.1 kg), SpO2 99 %.    HEENT: Neck without mass Lymphatics: No cervical, supraclavicular, axillary, or inguinal nodes Resp: Lungs with scattered end inspiratory coarse rhonchi, no respiratory distress Cardio: Regular rate and rhythm GI: No mass, nontender, no hepatosplenomegaly Vascular: No leg edema  Skin: Left upper back scar without evidence of recurrent tumor.  No evidence of recurrent tumor at the parietal scalp surgical defect.   Lab Results:   Lab Results  Component Value Date   CEA1 1.05 06/07/2020     Medications: I have reviewed the patient's current medications.   Assessment/Plan: 1. Adenocarcinoma of the cecum, status post a laparoscopic right colectomy 10/07/2017 ? Stage II (T3N0), lymphovascular and perineural invasion present, 0/20 lymph nodes positive, MSI-stable, no loss of mismatch repair protein expression ? CT abdomen/pelvis 10/02/2017-cecal mass, small lymph nodes in the cecal mesentery, 1.6 cm right adrenal nodule, 9 mm indeterminate left liver lesion ? CTs at [facility] 04/11/2018-negative for metastatic disease, stable right adrenal nodule ? CTs at [facility] 10/10/2018-negative for recurrent disease, stable adrenal nodule ? CTs at [facility] thousand 20-no evidence of metastatic disease, increased size of biliary ductal radicles at the dome of the right liver, stable right adrenal nodule 2. History of anemia-likely secondary to bleeding from the  cecum tumor  3.   History of melanoma  Melanoma at the left upper back at age 30  Parietal scalp melanoma 2015, status post wide excision and plastic surgery at [facility]  4.   Ascending colon polyp, tubular adenoma, identified on the right colectomy specimen 10/07/2017  Colonoscopy 12/18/2018- diverticulosis, no polyps     Disposition: Ms. Fear remains in clinical remission from colon cancer.  She would like to continue follow-up at the Cancer center.  She will return for an office visit in 6 months.  Betsy Coder, MD  07/21/2020  2:41 PM

## 2020-07-22 DIAGNOSIS — Z961 Presence of intraocular lens: Secondary | ICD-10-CM | POA: Diagnosis not present

## 2020-07-22 DIAGNOSIS — H52203 Unspecified astigmatism, bilateral: Secondary | ICD-10-CM | POA: Diagnosis not present

## 2020-07-22 DIAGNOSIS — H524 Presbyopia: Secondary | ICD-10-CM | POA: Diagnosis not present

## 2020-08-04 DIAGNOSIS — Z0120 Encounter for dental examination and cleaning without abnormal findings: Secondary | ICD-10-CM | POA: Diagnosis not present

## 2020-11-24 ENCOUNTER — Other Ambulatory Visit: Payer: Self-pay | Admitting: Family Medicine

## 2020-11-24 DIAGNOSIS — Z1231 Encounter for screening mammogram for malignant neoplasm of breast: Secondary | ICD-10-CM

## 2020-12-21 DIAGNOSIS — M6281 Muscle weakness (generalized): Secondary | ICD-10-CM | POA: Diagnosis not present

## 2020-12-21 DIAGNOSIS — M549 Dorsalgia, unspecified: Secondary | ICD-10-CM | POA: Diagnosis not present

## 2020-12-21 DIAGNOSIS — R262 Difficulty in walking, not elsewhere classified: Secondary | ICD-10-CM | POA: Diagnosis not present

## 2021-01-02 DIAGNOSIS — M6281 Muscle weakness (generalized): Secondary | ICD-10-CM | POA: Diagnosis not present

## 2021-01-02 DIAGNOSIS — R2681 Unsteadiness on feet: Secondary | ICD-10-CM | POA: Diagnosis not present

## 2021-01-02 DIAGNOSIS — M549 Dorsalgia, unspecified: Secondary | ICD-10-CM | POA: Diagnosis not present

## 2021-01-09 DIAGNOSIS — L570 Actinic keratosis: Secondary | ICD-10-CM | POA: Diagnosis not present

## 2021-01-09 DIAGNOSIS — L821 Other seborrheic keratosis: Secondary | ICD-10-CM | POA: Diagnosis not present

## 2021-01-09 DIAGNOSIS — D225 Melanocytic nevi of trunk: Secondary | ICD-10-CM | POA: Diagnosis not present

## 2021-01-09 DIAGNOSIS — D1801 Hemangioma of skin and subcutaneous tissue: Secondary | ICD-10-CM | POA: Diagnosis not present

## 2021-01-09 DIAGNOSIS — L814 Other melanin hyperpigmentation: Secondary | ICD-10-CM | POA: Diagnosis not present

## 2021-01-11 ENCOUNTER — Inpatient Hospital Stay: Admission: RE | Admit: 2021-01-11 | Payer: Medicare Other | Source: Ambulatory Visit

## 2021-01-17 ENCOUNTER — Telehealth: Payer: Self-pay | Admitting: Oncology

## 2021-01-17 ENCOUNTER — Other Ambulatory Visit: Payer: Self-pay

## 2021-01-17 ENCOUNTER — Inpatient Hospital Stay: Payer: Medicare Other | Attending: Oncology | Admitting: Oncology

## 2021-01-17 ENCOUNTER — Inpatient Hospital Stay: Payer: Medicare Other

## 2021-01-17 VITALS — BP 147/75 | HR 87 | Temp 97.8°F | Resp 18 | Ht 59.0 in | Wt 125.9 lb

## 2021-01-17 DIAGNOSIS — E279 Disorder of adrenal gland, unspecified: Secondary | ICD-10-CM | POA: Diagnosis not present

## 2021-01-17 DIAGNOSIS — C180 Malignant neoplasm of cecum: Secondary | ICD-10-CM | POA: Diagnosis not present

## 2021-01-17 DIAGNOSIS — Z86010 Personal history of colonic polyps: Secondary | ICD-10-CM | POA: Diagnosis not present

## 2021-01-17 DIAGNOSIS — K769 Liver disease, unspecified: Secondary | ICD-10-CM | POA: Insufficient documentation

## 2021-01-17 DIAGNOSIS — Z85820 Personal history of malignant melanoma of skin: Secondary | ICD-10-CM | POA: Insufficient documentation

## 2021-01-17 LAB — CEA (IN HOUSE-CHCC): CEA (CHCC-In House): 1.39 ng/mL (ref 0.00–5.00)

## 2021-01-17 NOTE — Telephone Encounter (Signed)
Scheduled appt per 3/29 los - mailed letter with appt date and time

## 2021-01-17 NOTE — Progress Notes (Signed)
  [place] OFFICE PROGRESS NOTE   Diagnosis: Colon cancer  INTERVAL HISTORY:   Ms. Frances Ortiz returns as scheduled.  She feels well.  Good appetite and energy level.  She has 1 loose bowel movement daily.  This has been a pattern since she underwent colon surgery.  She underwent a biopsy of the parietal skull scar region last week.  She reports intentional weight loss.  Objective:  Vital signs in last 24 hours:  Blood pressure (!) 147/75, pulse 87, temperature 97.8 F (36.6 C), temperature source Tympanic, resp. rate 18, height $RemoveBe'4\' 11"'lwEzXRaaW$  (1.499 m), weight 125 lb 14.4 oz (57.1 kg), SpO2 97 %.    HEENT: Neck without mass Lymphatics: No cervical, supraclavicular, axillary, or inguinal nodes Resp: Scattered end expiratory coarse rhonchi at the posterior chest bilaterally, no respiratory distress Cardio: Regular rate and rhythm GI: No hepatosplenomegaly, no mass, nontender Vascular: No leg edema  Skin: Scab at recent parietal scar biopsy site, no evidence for recurrent tumor.  Left upper back scar without evidence of recurrent tumor   Lab Results:  Lab Results  Component Value Date   WBC 8.2 10/10/2017   HGB 10.3 (L) 10/10/2017   HCT 32.5 (L) 10/10/2017   MCV 86.9 10/10/2017   PLT 346 10/10/2017   NEUTROABS 5.5 10/10/2017    CMP  Lab Results  Component Value Date   NA 137 10/10/2017   K 3.3 (L) 10/10/2017   CL 103 10/10/2017   CO2 25 10/10/2017   GLUCOSE 110 (H) 10/10/2017   BUN 8 10/10/2017   CREATININE 0.60 10/10/2017   CALCIUM 8.7 (L) 10/10/2017   PROT 7.2 10/03/2017   ALBUMIN 3.9 10/03/2017   AST 30 10/03/2017   ALT 22 10/03/2017   ALKPHOS 81 10/03/2017   BILITOT 0.4 10/03/2017   GFRNONAA >60 10/10/2017   GFRAA >60 10/10/2017    Lab Results  Component Value Date   CEA1 1.05 06/07/2020   Medications: I have reviewed the patient's current medications.   Assessment/Plan:  1. Adenocarcinoma of the cecum, status post a laparoscopic right  colectomy 10/07/2017 ? Stage II (T3N0), lymphovascular and perineural invasion present, 0/20 lymph nodes positive, MSI-stable, no loss of mismatch repair protein expression ? CT abdomen/pelvis 10/02/2017-cecal mass, small lymph nodes in the cecal mesentery, 1.6 cm right adrenal nodule, 9 mm indeterminate left liver lesion ? CTs at [facility] 04/11/2018-negative for metastatic disease, stable right adrenal nodule ? CTs at [facility] 10/10/2018-negative for recurrent disease, stable adrenal nodule ? CTs at [facility] thousand 20-no evidence of metastatic disease, increased size of biliary ductal radicles at the dome of the right liver, stable right adrenal nodule 2. History of anemia-likely secondary to bleeding from the cecum tumor  3.   History of melanoma  Melanoma at the left upper back at age 39  Parietal scalp melanoma 2015, status post wide excision and plastic surgery at [facility]  4.   Ascending colon polyp, tubular adenoma, identified on the right colectomy specimen 10/07/2017  Colonoscopy 12/18/2018- diverticulosis, no polyps      Disposition: Frances Ortiz is in clinical remission from colon cancer.  We will follow up on the CEA from today.  She would like to continue follow-up at the Cancer center.  She will return for an office visit and CEA in 6 months.  She will follow up with her dermatologist regarding the scalp biopsy from last week.  Betsy Coder, MD  01/17/2021  11:05 AM

## 2021-01-18 ENCOUNTER — Telehealth: Payer: Self-pay | Admitting: *Deleted

## 2021-01-18 NOTE — Telephone Encounter (Signed)
-----   Message from Ladell Pier, MD sent at 01/17/2021  1:46 PM EDT ----- Please call patient, CEA is normal, follow-up as scheduled

## 2021-01-18 NOTE — Telephone Encounter (Signed)
Notified of normal CEA and f/u as scheduled.

## 2021-01-19 DIAGNOSIS — M549 Dorsalgia, unspecified: Secondary | ICD-10-CM | POA: Diagnosis not present

## 2021-01-19 DIAGNOSIS — R2681 Unsteadiness on feet: Secondary | ICD-10-CM | POA: Diagnosis not present

## 2021-01-19 DIAGNOSIS — M6281 Muscle weakness (generalized): Secondary | ICD-10-CM | POA: Diagnosis not present

## 2021-01-23 DIAGNOSIS — M6281 Muscle weakness (generalized): Secondary | ICD-10-CM | POA: Diagnosis not present

## 2021-01-23 DIAGNOSIS — M549 Dorsalgia, unspecified: Secondary | ICD-10-CM | POA: Diagnosis not present

## 2021-02-13 DIAGNOSIS — R2681 Unsteadiness on feet: Secondary | ICD-10-CM | POA: Diagnosis not present

## 2021-02-13 DIAGNOSIS — M6281 Muscle weakness (generalized): Secondary | ICD-10-CM | POA: Diagnosis not present

## 2021-02-13 DIAGNOSIS — M549 Dorsalgia, unspecified: Secondary | ICD-10-CM | POA: Diagnosis not present

## 2021-02-20 DIAGNOSIS — M549 Dorsalgia, unspecified: Secondary | ICD-10-CM | POA: Diagnosis not present

## 2021-02-20 DIAGNOSIS — R2681 Unsteadiness on feet: Secondary | ICD-10-CM | POA: Diagnosis not present

## 2021-02-20 DIAGNOSIS — M6281 Muscle weakness (generalized): Secondary | ICD-10-CM | POA: Diagnosis not present

## 2021-02-21 ENCOUNTER — Ambulatory Visit: Payer: Medicare Other | Attending: Critical Care Medicine

## 2021-02-21 DIAGNOSIS — Z20822 Contact with and (suspected) exposure to covid-19: Secondary | ICD-10-CM

## 2021-02-22 LAB — NOVEL CORONAVIRUS, NAA: SARS-CoV-2, NAA: NOT DETECTED

## 2021-03-02 ENCOUNTER — Ambulatory Visit: Payer: Medicare Other | Attending: Internal Medicine

## 2021-03-02 DIAGNOSIS — Z20822 Contact with and (suspected) exposure to covid-19: Secondary | ICD-10-CM | POA: Diagnosis not present

## 2021-03-03 DIAGNOSIS — M6281 Muscle weakness (generalized): Secondary | ICD-10-CM | POA: Diagnosis not present

## 2021-03-03 DIAGNOSIS — R262 Difficulty in walking, not elsewhere classified: Secondary | ICD-10-CM | POA: Diagnosis not present

## 2021-03-03 DIAGNOSIS — M549 Dorsalgia, unspecified: Secondary | ICD-10-CM | POA: Diagnosis not present

## 2021-03-04 LAB — NOVEL CORONAVIRUS, NAA: SARS-CoV-2, NAA: NOT DETECTED

## 2021-03-04 LAB — SARS-COV-2, NAA 2 DAY TAT

## 2021-03-06 ENCOUNTER — Ambulatory Visit
Admission: RE | Admit: 2021-03-06 | Discharge: 2021-03-06 | Disposition: A | Discharge status: Home / Self Care | Payer: Medicare Other | Source: Ambulatory Visit | Attending: Family Medicine | Admitting: Family Medicine

## 2021-03-06 ENCOUNTER — Ambulatory Visit: Payer: Medicare Other

## 2021-03-06 ENCOUNTER — Other Ambulatory Visit: Payer: Self-pay

## 2021-03-06 DIAGNOSIS — Z1231 Encounter for screening mammogram for malignant neoplasm of breast: Secondary | ICD-10-CM

## 2021-03-08 DIAGNOSIS — Z0000 Encounter for general adult medical examination without abnormal findings: Secondary | ICD-10-CM | POA: Diagnosis not present

## 2021-03-08 DIAGNOSIS — M810 Age-related osteoporosis without current pathological fracture: Secondary | ICD-10-CM | POA: Diagnosis not present

## 2021-03-08 DIAGNOSIS — M545 Low back pain, unspecified: Secondary | ICD-10-CM | POA: Diagnosis not present

## 2021-03-08 DIAGNOSIS — F322 Major depressive disorder, single episode, severe without psychotic features: Secondary | ICD-10-CM | POA: Diagnosis not present

## 2021-03-10 DIAGNOSIS — M549 Dorsalgia, unspecified: Secondary | ICD-10-CM | POA: Diagnosis not present

## 2021-03-10 DIAGNOSIS — R262 Difficulty in walking, not elsewhere classified: Secondary | ICD-10-CM | POA: Diagnosis not present

## 2021-03-10 DIAGNOSIS — M6281 Muscle weakness (generalized): Secondary | ICD-10-CM | POA: Diagnosis not present

## 2021-03-13 DIAGNOSIS — R531 Weakness: Secondary | ICD-10-CM | POA: Diagnosis not present

## 2021-03-13 DIAGNOSIS — M549 Dorsalgia, unspecified: Secondary | ICD-10-CM | POA: Diagnosis not present

## 2021-03-13 DIAGNOSIS — R2681 Unsteadiness on feet: Secondary | ICD-10-CM | POA: Diagnosis not present

## 2021-03-24 DIAGNOSIS — R531 Weakness: Secondary | ICD-10-CM | POA: Diagnosis not present

## 2021-03-24 DIAGNOSIS — R2681 Unsteadiness on feet: Secondary | ICD-10-CM | POA: Diagnosis not present

## 2021-03-24 DIAGNOSIS — M549 Dorsalgia, unspecified: Secondary | ICD-10-CM | POA: Diagnosis not present

## 2021-04-03 ENCOUNTER — Ambulatory Visit: Payer: Medicare Other | Attending: Critical Care Medicine

## 2021-04-03 DIAGNOSIS — Z20822 Contact with and (suspected) exposure to covid-19: Secondary | ICD-10-CM | POA: Diagnosis not present

## 2021-04-04 LAB — NOVEL CORONAVIRUS, NAA: SARS-CoV-2, NAA: NOT DETECTED

## 2021-04-04 LAB — SARS-COV-2, NAA 2 DAY TAT

## 2021-07-18 DIAGNOSIS — C44519 Basal cell carcinoma of skin of other part of trunk: Secondary | ICD-10-CM | POA: Diagnosis not present

## 2021-07-18 DIAGNOSIS — C44612 Basal cell carcinoma of skin of right upper limb, including shoulder: Secondary | ICD-10-CM | POA: Diagnosis not present

## 2021-07-18 DIAGNOSIS — D485 Neoplasm of uncertain behavior of skin: Secondary | ICD-10-CM | POA: Diagnosis not present

## 2021-07-18 DIAGNOSIS — C441192 Basal cell carcinoma of skin of left lower eyelid, including canthus: Secondary | ICD-10-CM | POA: Diagnosis not present

## 2021-07-18 DIAGNOSIS — D1801 Hemangioma of skin and subcutaneous tissue: Secondary | ICD-10-CM | POA: Diagnosis not present

## 2021-07-18 DIAGNOSIS — D225 Melanocytic nevi of trunk: Secondary | ICD-10-CM | POA: Diagnosis not present

## 2021-07-18 DIAGNOSIS — L814 Other melanin hyperpigmentation: Secondary | ICD-10-CM | POA: Diagnosis not present

## 2021-07-18 DIAGNOSIS — L821 Other seborrheic keratosis: Secondary | ICD-10-CM | POA: Diagnosis not present

## 2021-07-20 ENCOUNTER — Inpatient Hospital Stay: Payer: Medicare Other | Attending: Oncology | Admitting: Oncology

## 2021-07-20 ENCOUNTER — Other Ambulatory Visit: Payer: Self-pay

## 2021-07-20 ENCOUNTER — Inpatient Hospital Stay: Payer: Medicare Other

## 2021-07-20 VITALS — BP 154/75 | HR 83 | Temp 97.8°F | Resp 18 | Ht 59.0 in | Wt 127.6 lb

## 2021-07-20 DIAGNOSIS — C180 Malignant neoplasm of cecum: Secondary | ICD-10-CM | POA: Diagnosis not present

## 2021-07-20 LAB — CEA (ACCESS): CEA (CHCC): 1.49 ng/mL (ref 0.00–5.00)

## 2021-07-20 NOTE — Progress Notes (Signed)
  [facility] OFFICE PROGRESS NOTE   Diagnosis: Colon cancer  INTERVAL HISTORY:   Frances Ortiz returns as scheduled.  She feels well.  She has intermittent diarrhea.  No bleeding.  She reports diarrhea since she underwent colon surgery.  She had a biopsy of the left eyelid this week and reports she most likely has a basal cell carcinoma.  Objective:  Vital signs in last 24 hours:  Blood pressure (!) 154/75, pulse 83, temperature 97.8 F (36.6 C), temperature source Oral, resp. rate 18, height $RemoveBe'4\' 11"'kVkjxEyqQ$  (1.499 m), weight 127 lb 9.6 oz (57.9 kg), SpO2 100 %.    HEENT: Neck without mass Lymphatics: No cervical, supraclavicular, axillary, or inguinal nodes Resp: Coarse end inspiratory rhonchi at the lower posterior chest bilaterally, no respiratory distress Cardio: Regular rate and rhythm GI: No hepatosplenomegaly, nontender, no mass Vascular: No leg edema  Skin: Biopsy site at the left lower eyelid, scalp and left upper back scars without evidence of recurrent tumor    Lab Re   Lab Results  Component Value Date   CEA1 1.39 01/17/2021    Medications: I have reviewed the patient's current medications.   Assessment/Plan:  Adenocarcinoma of the cecum, status post a laparoscopic right colectomy 10/07/2017 Stage II (T3N0), lymphovascular and perineural invasion present, 0/20 lymph nodes positive, MSI-stable, no loss of mismatch repair protein expression CT abdomen/pelvis 10/02/2017-cecal mass, small lymph nodes in the cecal mesentery, 1.6 cm right adrenal nodule, 9 mm indeterminate left liver lesion CTs at [facility] 04/11/2018-negative for metastatic disease, stable right adrenal nodule CTs at [facility] 10/10/2018-negative for recurrent disease, stable adrenal nodule CTs at [facility] t 06/12/2019-no evidence of metastatic disease, increased size of biliary ductal radicles at the dome of the right liver, stable right adrenal nodule History of anemia-likely secondary  to bleeding from the cecum tumor   3.   History of melanoma Melanoma at the left upper back at age 26 Parietal scalp melanoma 2015, status post wide excision and plastic surgery at [facility]   4.   Ascending colon polyp, tubular adenoma, identified on the right colectomy specimen 10/07/2017 Colonoscopy 12/18/2018- diverticulosis, no polyps     Disposition: Frances Ortiz is in remission from colon cancer.  We will follow-up on the CEA from today.  She would like to continue follow-up with Cancer center.  She will return for an office visit and CEA in 9 months.  She will continue follow-up with dermatology for evaluation of current skin lesions and surveillance for melanoma.  Betsy Coder, MD  07/20/2021  11:59 AM

## 2021-07-21 LAB — CEA (IN HOUSE-CHCC): CEA (CHCC-In House): 1.4 ng/mL (ref 0.00–5.00)

## 2021-07-24 ENCOUNTER — Telehealth: Payer: Self-pay

## 2021-07-24 DIAGNOSIS — H524 Presbyopia: Secondary | ICD-10-CM | POA: Diagnosis not present

## 2021-07-24 DIAGNOSIS — H26492 Other secondary cataract, left eye: Secondary | ICD-10-CM | POA: Diagnosis not present

## 2021-07-24 DIAGNOSIS — Z961 Presence of intraocular lens: Secondary | ICD-10-CM | POA: Diagnosis not present

## 2021-07-24 DIAGNOSIS — H52203 Unspecified astigmatism, bilateral: Secondary | ICD-10-CM | POA: Diagnosis not present

## 2021-07-24 NOTE — Telephone Encounter (Signed)
left msg that CEA is normal and follow up as scheduled.

## 2021-08-21 DIAGNOSIS — C44612 Basal cell carcinoma of skin of right upper limb, including shoulder: Secondary | ICD-10-CM | POA: Diagnosis not present

## 2021-08-21 DIAGNOSIS — C44519 Basal cell carcinoma of skin of other part of trunk: Secondary | ICD-10-CM | POA: Diagnosis not present

## 2021-08-21 DIAGNOSIS — L570 Actinic keratosis: Secondary | ICD-10-CM | POA: Diagnosis not present

## 2021-09-28 DIAGNOSIS — C441192 Basal cell carcinoma of skin of left lower eyelid, including canthus: Secondary | ICD-10-CM | POA: Diagnosis not present

## 2021-09-28 DIAGNOSIS — Z20822 Contact with and (suspected) exposure to covid-19: Secondary | ICD-10-CM | POA: Diagnosis not present

## 2021-10-13 DIAGNOSIS — Z20822 Contact with and (suspected) exposure to covid-19: Secondary | ICD-10-CM | POA: Diagnosis not present

## 2022-01-18 DIAGNOSIS — Z23 Encounter for immunization: Secondary | ICD-10-CM | POA: Diagnosis not present

## 2022-01-18 DIAGNOSIS — L723 Sebaceous cyst: Secondary | ICD-10-CM | POA: Diagnosis not present

## 2022-01-18 DIAGNOSIS — L570 Actinic keratosis: Secondary | ICD-10-CM | POA: Diagnosis not present

## 2022-01-18 DIAGNOSIS — L820 Inflamed seborrheic keratosis: Secondary | ICD-10-CM | POA: Diagnosis not present

## 2022-01-20 IMAGING — MG MM DIGITAL SCREENING BILAT W/ TOMO AND CAD
6 of 10 series · 6 of 30 positions shown · non-contrast
Comparison: Previous exam(s).

CLINICAL DATA: Screening.

EXAM:
DIGITAL SCREENING BILATERAL MAMMOGRAM WITH TOMOSYNTHESIS AND CAD
TECHNIQUE: Bilateral screening digital craniocaudal and mediolateral oblique
mammograms were obtained. Bilateral screening digital breast
tomosynthesis was performed. The images were evaluated with
computer-aided detection.

[L CC synth-2D]
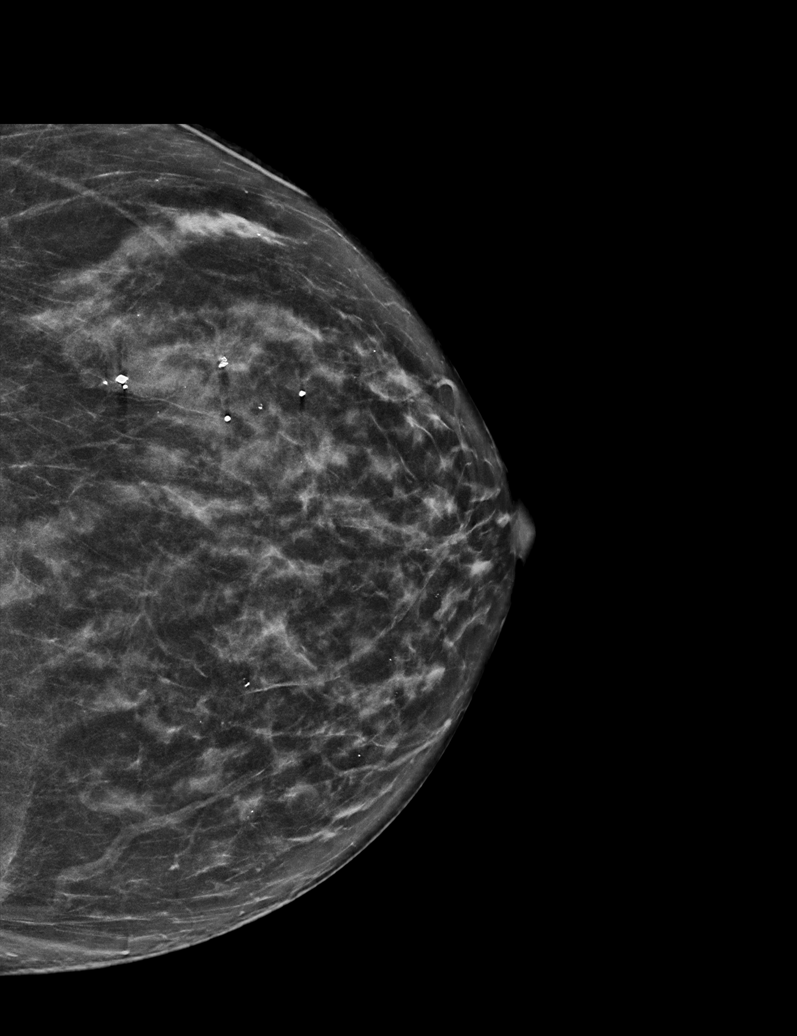

[L MLO synth-2D (1 of 2)]
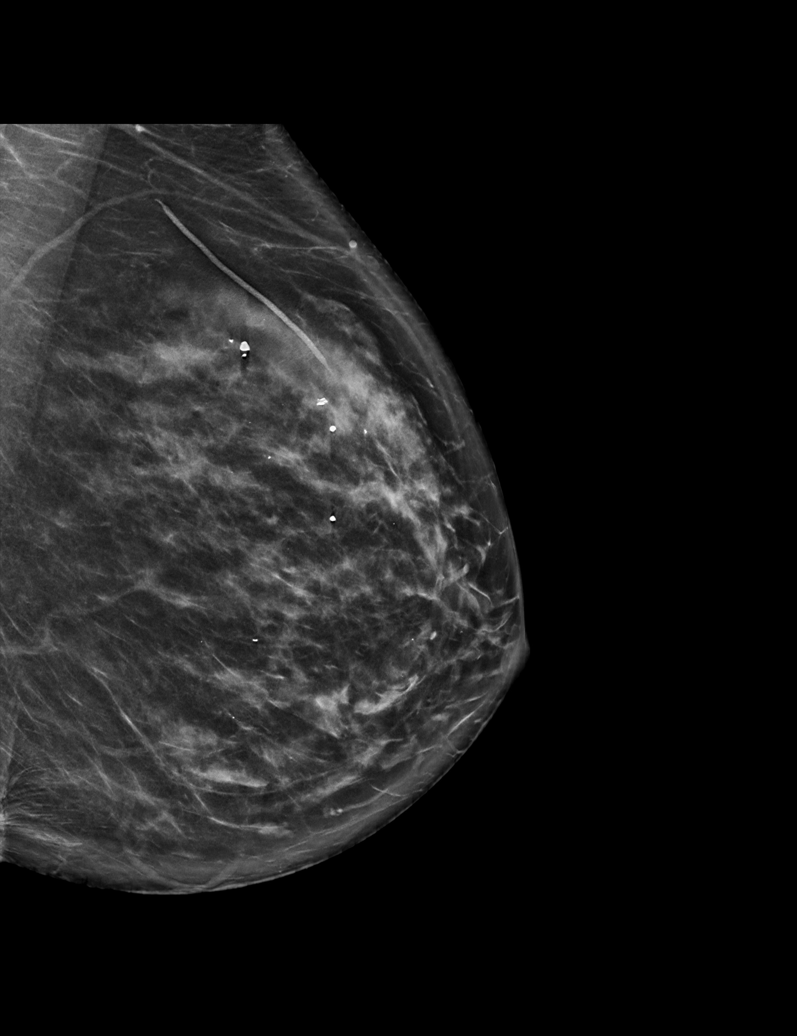

[R CC synth-2D]
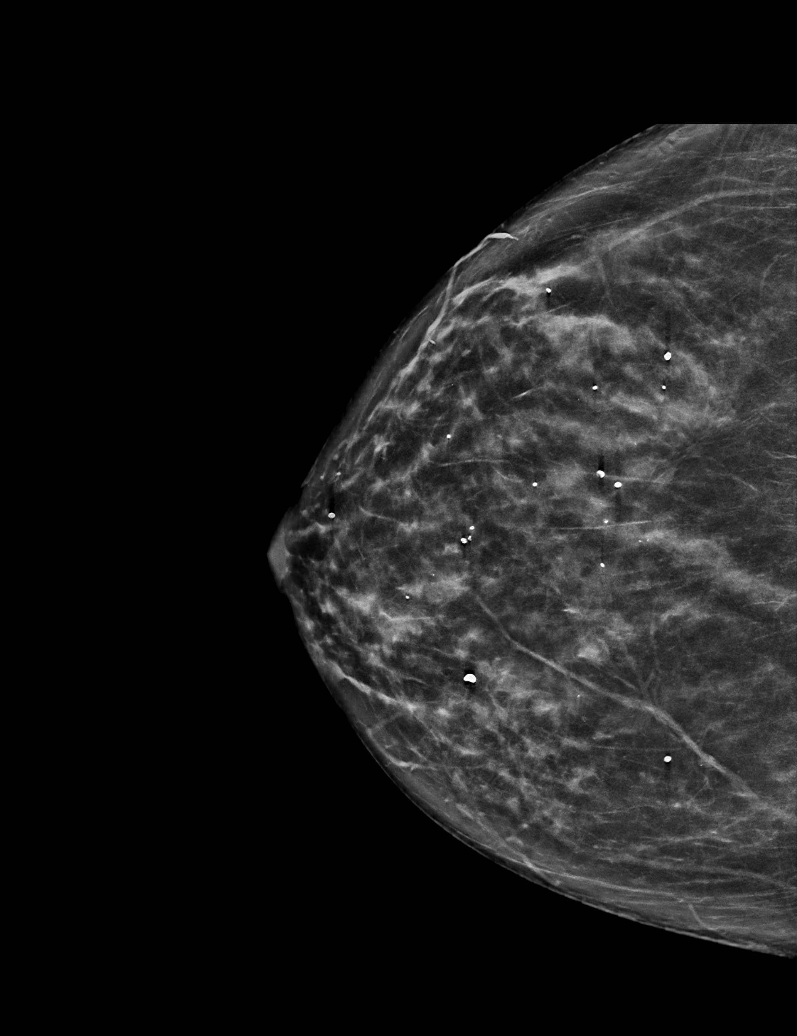

[L MLO synth-2D (2 of 2)]
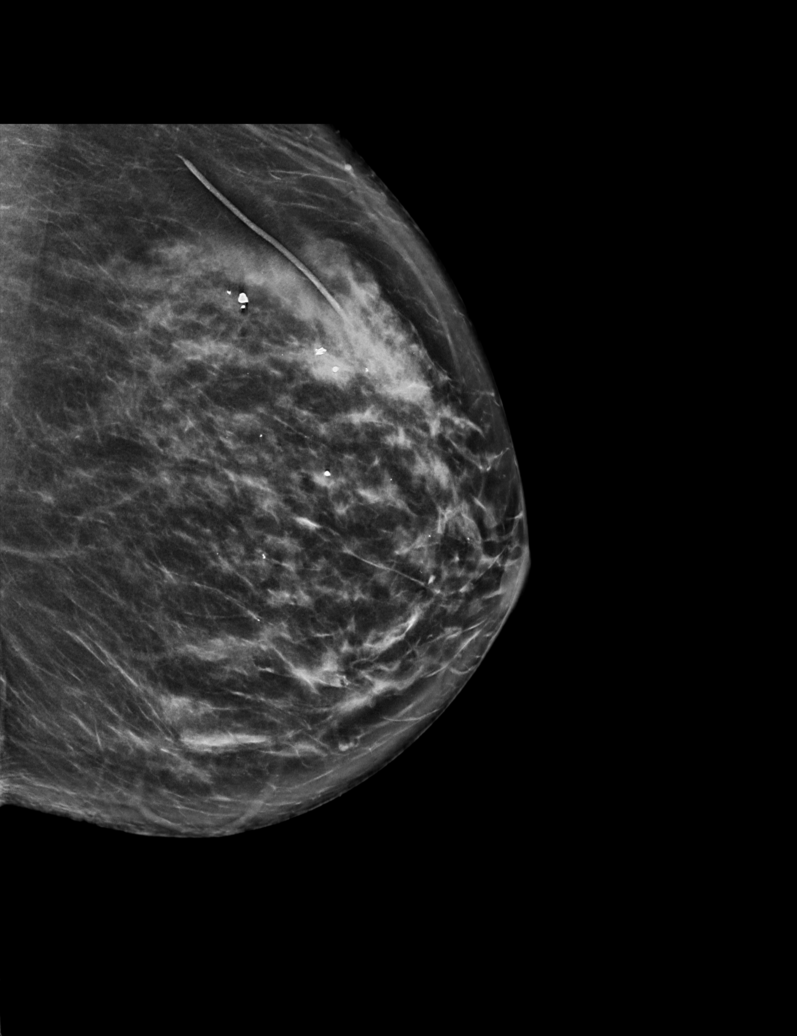

[R MLO synth-2D]
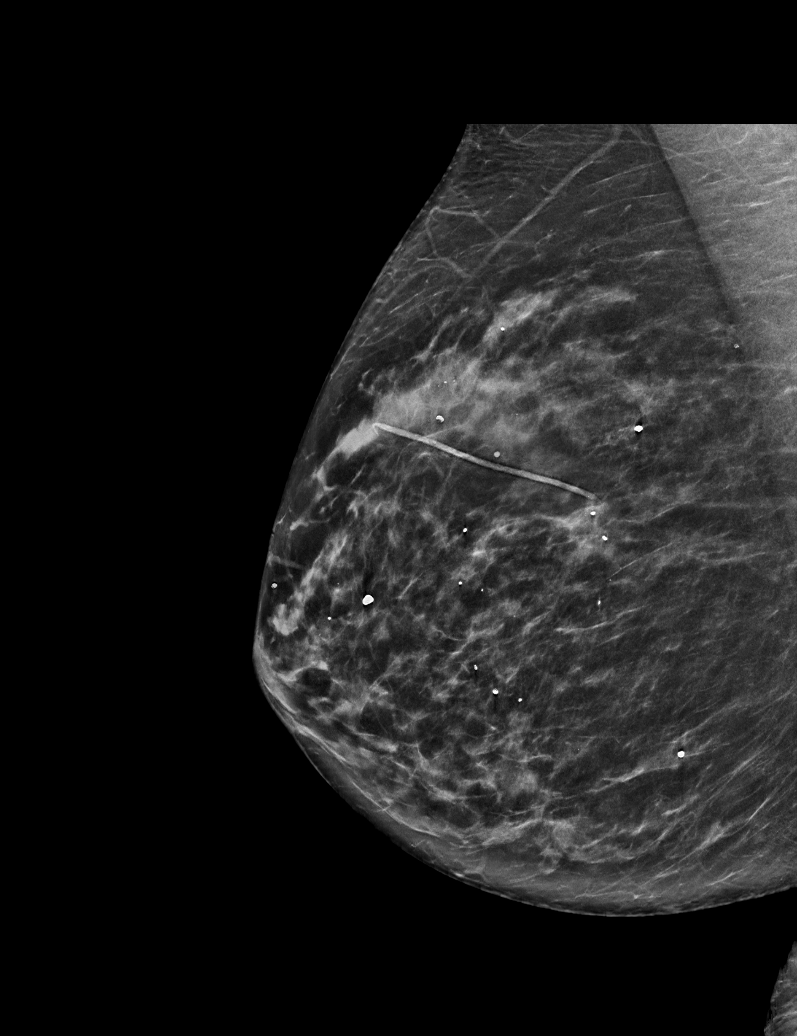

[L CC tomo · tomo slice 30/59.0]
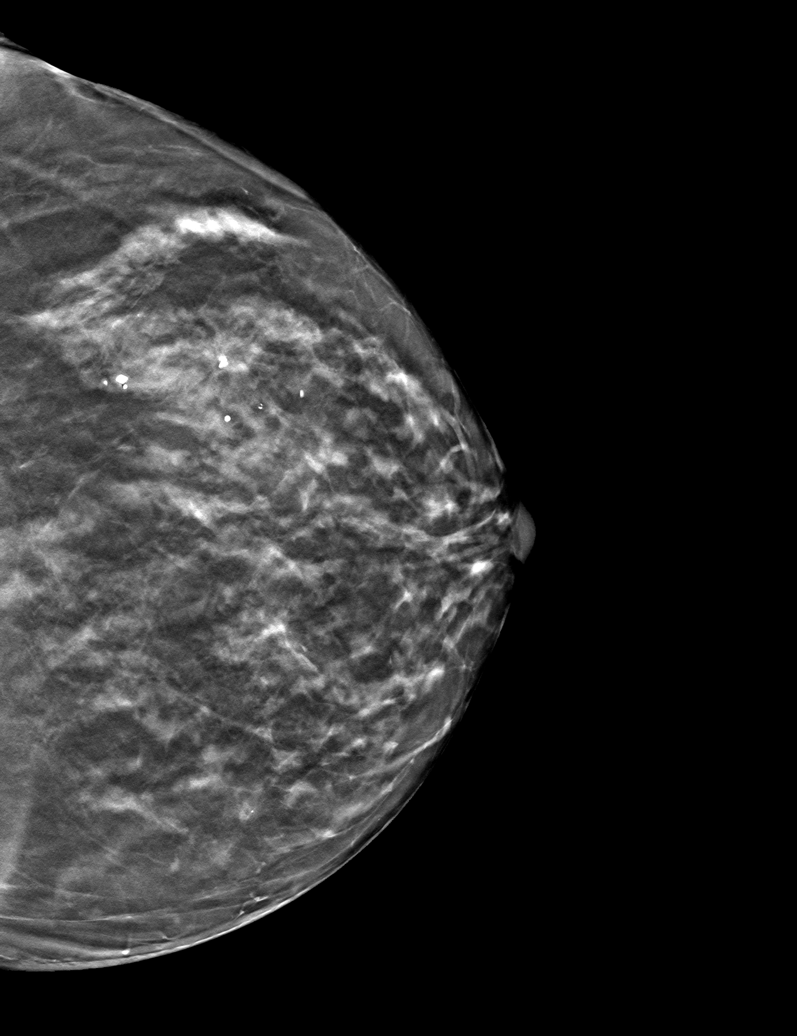

[6 of 30 positions shown; findings below may reference images not displayed]

ACR Breast Density Category c: The breast tissue is heterogeneously
dense, which may obscure small masses.
FINDINGS: There are no findings suspicious for malignancy. The images were
evaluated with computer-aided detection.
IMPRESSION: No mammographic evidence of malignancy. A result letter of this
screening mammogram will be mailed directly to the patient.

RECOMMENDATION:
Screening mammogram in one year. (Code:T4-5-GWO)

BI-RADS CATEGORY  1: Negative.

## 2022-03-11 DIAGNOSIS — R21 Rash and other nonspecific skin eruption: Secondary | ICD-10-CM | POA: Diagnosis not present

## 2022-03-11 DIAGNOSIS — S40261A Insect bite (nonvenomous) of right shoulder, initial encounter: Secondary | ICD-10-CM | POA: Diagnosis not present

## 2022-03-11 DIAGNOSIS — W57XXXA Bitten or stung by nonvenomous insect and other nonvenomous arthropods, initial encounter: Secondary | ICD-10-CM | POA: Diagnosis not present

## 2022-04-12 ENCOUNTER — Other Ambulatory Visit: Payer: Self-pay | Admitting: Family Medicine

## 2022-04-12 DIAGNOSIS — Z1231 Encounter for screening mammogram for malignant neoplasm of breast: Secondary | ICD-10-CM

## 2022-04-13 DIAGNOSIS — Z0000 Encounter for general adult medical examination without abnormal findings: Secondary | ICD-10-CM | POA: Diagnosis not present

## 2022-04-13 DIAGNOSIS — M810 Age-related osteoporosis without current pathological fracture: Secondary | ICD-10-CM | POA: Diagnosis not present

## 2022-04-13 DIAGNOSIS — M545 Low back pain, unspecified: Secondary | ICD-10-CM | POA: Diagnosis not present

## 2022-04-13 DIAGNOSIS — F322 Major depressive disorder, single episode, severe without psychotic features: Secondary | ICD-10-CM | POA: Diagnosis not present

## 2022-04-18 ENCOUNTER — Inpatient Hospital Stay: Payer: Medicare Other | Attending: Oncology | Admitting: Oncology

## 2022-04-18 ENCOUNTER — Inpatient Hospital Stay: Payer: Medicare Other

## 2022-04-18 VITALS — BP 139/76 | HR 87 | Temp 98.2°F | Resp 18 | Ht 59.0 in | Wt 133.2 lb

## 2022-04-18 DIAGNOSIS — Z85820 Personal history of malignant melanoma of skin: Secondary | ICD-10-CM | POA: Insufficient documentation

## 2022-04-18 DIAGNOSIS — C180 Malignant neoplasm of cecum: Secondary | ICD-10-CM

## 2022-04-18 LAB — CEA (ACCESS): CEA (CHCC): 1.41 ng/mL (ref 0.00–5.00)

## 2022-04-18 NOTE — Progress Notes (Signed)
  [place] OFFICE PROGRESS NOTE   Diagnosis: Colon cancer  INTERVAL HISTORY:   Ms. Igou returns as scheduled.  She feels well.  No complaint.  Objective:  Vital signs in last 24 hours:  Blood pressure 139/76, pulse 87, temperature 98.2 F (36.8 C), temperature source Oral, resp. rate 18, height $RemoveBe'4\' 11"'CuQAJgnmV$  (1.499 m), weight 133 lb 3.2 oz (60.4 kg), SpO2 98 %.    HEENT: Neck without mass Lymphatics: No cervical, supraclavicular, axillary, or inguinal nodes Resp: Lungs with scattered end inspiratory rhonchi bilaterally, no respiratory distress Cardio: Regular rate and rhythm GI: No hepatosplenomegaly, no mass, nontender Vascular: No leg edema  Skin: Parietal and left upper back scar without evidence of recurrent tumor  Lab Results:  Lab Results  Component Value Date   WBC 8.2 10/10/2017   HGB 10.3 (L) 10/10/2017   HCT 32.5 (L) 10/10/2017   MCV 86.9 10/10/2017   PLT 346 10/10/2017   NEUTROABS 5.5 10/10/2017    CMP  Lab Results  Component Value Date   NA 137 10/10/2017   K 3.3 (L) 10/10/2017   CL 103 10/10/2017   CO2 25 10/10/2017   GLUCOSE 110 (H) 10/10/2017   BUN 8 10/10/2017   CREATININE 0.60 10/10/2017   CALCIUM 8.7 (L) 10/10/2017   PROT 7.2 10/03/2017   ALBUMIN 3.9 10/03/2017   AST 30 10/03/2017   ALT 22 10/03/2017   ALKPHOS 81 10/03/2017   BILITOT 0.4 10/03/2017   GFRNONAA >60 10/10/2017   GFRAA >60 10/10/2017    Lab Results  Component Value Date   CEA1 1.40 07/20/2021   CEA 1.49 07/20/2021    Medications: I have reviewed the patient's current medications.   Assessment/Plan: Adenocarcinoma of the cecum, status post a laparoscopic right colectomy 10/07/2017 Stage II (T3N0), lymphovascular and perineural invasion present, 0/20 lymph nodes positive, MSI-stable, no loss of mismatch repair protein expression CT abdomen/pelvis 10/02/2017-cecal mass, small lymph nodes in the cecal mesentery, 1.6 cm right adrenal nodule, 9 mm indeterminate  left liver lesion CTs at [facility] 04/11/2018-negative for metastatic disease, stable right adrenal nodule CTs at [facility] 10/10/2018-negative for recurrent disease, stable adrenal nodule CTs at [facility] t 06/12/2019-no evidence of metastatic disease, increased size of biliary ductal radicles at the dome of the right liver, stable right adrenal nodule History of anemia-likely secondary to bleeding from the cecum tumor   3.   History of melanoma Melanoma at the left upper back at age 81 Parietal scalp melanoma 2015, status post wide excision and plastic surgery at [facility]   4.   Ascending colon polyp, tubular adenoma, identified on the right colectomy specimen 10/07/2017 Colonoscopy 12/18/2018- diverticulosis, no polyps       Disposition: Ms. Kaylor remains in clinical remission from colon cancer she is almost 5 years out from surgery.  We will follow-up on the CEA from today.  If the CEA is normal she will be discharged from the oncology clinic.  She plans to continue clinical follow-up with Dr. Alroy Dust.  She is followed by dermatology for the history of melanoma.  She had a surveillance colonoscopy in 2020.  She remains undecided on continuing colonoscopy surveillance.  She will discuss this with Dr. Alroy Dust and gastroenterology.  I am available to see her in the future as needed.  Betsy Coder, MD  04/18/2022  11:31 AM

## 2022-04-19 ENCOUNTER — Telehealth: Payer: Self-pay

## 2022-04-19 NOTE — Telephone Encounter (Signed)
Pt verbalized understanding.

## 2022-04-19 NOTE — Telephone Encounter (Signed)
-----   Message from Ladell Pier, MD sent at 04/18/2022  4:10 PM EDT ----- Please call patient, CEA is normal, follow-up as needed

## 2022-04-26 ENCOUNTER — Ambulatory Visit
Admission: RE | Admit: 2022-04-26 | Discharge: 2022-04-26 | Disposition: A | Discharge status: Home / Self Care | Payer: Medicare Other | Source: Ambulatory Visit | Attending: Family Medicine | Admitting: Family Medicine

## 2022-04-26 DIAGNOSIS — Z1231 Encounter for screening mammogram for malignant neoplasm of breast: Secondary | ICD-10-CM | POA: Diagnosis not present

## 2022-07-24 DIAGNOSIS — L821 Other seborrheic keratosis: Secondary | ICD-10-CM | POA: Diagnosis not present

## 2022-07-24 DIAGNOSIS — L814 Other melanin hyperpigmentation: Secondary | ICD-10-CM | POA: Diagnosis not present

## 2022-07-24 DIAGNOSIS — Z872 Personal history of diseases of the skin and subcutaneous tissue: Secondary | ICD-10-CM | POA: Diagnosis not present

## 2022-07-24 DIAGNOSIS — L820 Inflamed seborrheic keratosis: Secondary | ICD-10-CM | POA: Diagnosis not present

## 2022-08-02 DIAGNOSIS — H26492 Other secondary cataract, left eye: Secondary | ICD-10-CM | POA: Diagnosis not present

## 2022-08-02 DIAGNOSIS — H524 Presbyopia: Secondary | ICD-10-CM | POA: Diagnosis not present

## 2022-08-02 DIAGNOSIS — Z961 Presence of intraocular lens: Secondary | ICD-10-CM | POA: Diagnosis not present

## 2022-08-02 DIAGNOSIS — H52203 Unspecified astigmatism, bilateral: Secondary | ICD-10-CM | POA: Diagnosis not present

## 2022-08-21 DIAGNOSIS — M545 Low back pain, unspecified: Secondary | ICD-10-CM | POA: Diagnosis not present

## 2022-08-22 ENCOUNTER — Other Ambulatory Visit: Payer: Self-pay | Admitting: Family Medicine

## 2022-08-22 DIAGNOSIS — M545 Low back pain, unspecified: Secondary | ICD-10-CM

## 2022-09-03 ENCOUNTER — Ambulatory Visit
Admission: RE | Admit: 2022-09-03 | Discharge: 2022-09-03 | Disposition: A | Discharge status: Home / Self Care | Payer: Medicare Other | Source: Ambulatory Visit | Attending: Family Medicine | Admitting: Family Medicine

## 2022-09-03 DIAGNOSIS — M545 Low back pain, unspecified: Secondary | ICD-10-CM

## 2022-09-03 DIAGNOSIS — M4316 Spondylolisthesis, lumbar region: Secondary | ICD-10-CM | POA: Diagnosis not present

## 2022-09-03 DIAGNOSIS — M48061 Spinal stenosis, lumbar region without neurogenic claudication: Secondary | ICD-10-CM | POA: Diagnosis not present

## 2022-09-12 DIAGNOSIS — M47816 Spondylosis without myelopathy or radiculopathy, lumbar region: Secondary | ICD-10-CM | POA: Diagnosis not present

## 2022-09-12 DIAGNOSIS — Z6828 Body mass index (BMI) 28.0-28.9, adult: Secondary | ICD-10-CM | POA: Diagnosis not present

## 2022-09-25 DIAGNOSIS — M47816 Spondylosis without myelopathy or radiculopathy, lumbar region: Secondary | ICD-10-CM | POA: Diagnosis not present

## 2022-10-03 DIAGNOSIS — M47816 Spondylosis without myelopathy or radiculopathy, lumbar region: Secondary | ICD-10-CM | POA: Diagnosis not present

## 2022-11-20 DIAGNOSIS — M545 Low back pain, unspecified: Secondary | ICD-10-CM | POA: Diagnosis not present

## 2022-11-29 DIAGNOSIS — M47816 Spondylosis without myelopathy or radiculopathy, lumbar region: Secondary | ICD-10-CM | POA: Diagnosis not present

## 2022-11-30 DIAGNOSIS — M47816 Spondylosis without myelopathy or radiculopathy, lumbar region: Secondary | ICD-10-CM | POA: Diagnosis not present

## 2022-12-06 DIAGNOSIS — M545 Low back pain, unspecified: Secondary | ICD-10-CM | POA: Diagnosis not present

## 2022-12-06 DIAGNOSIS — M62830 Muscle spasm of back: Secondary | ICD-10-CM | POA: Diagnosis not present

## 2022-12-09 ENCOUNTER — Encounter (HOSPITAL_BASED_OUTPATIENT_CLINIC_OR_DEPARTMENT_OTHER): Payer: Self-pay

## 2022-12-09 ENCOUNTER — Emergency Department (HOSPITAL_BASED_OUTPATIENT_CLINIC_OR_DEPARTMENT_OTHER): Payer: Medicare Other

## 2022-12-09 ENCOUNTER — Emergency Department (HOSPITAL_BASED_OUTPATIENT_CLINIC_OR_DEPARTMENT_OTHER)
Admission: EM | Admit: 2022-12-09 | Discharge: 2022-12-09 | Disposition: A | Discharge status: Home / Self Care | Payer: Medicare Other | Attending: Emergency Medicine | Admitting: Emergency Medicine

## 2022-12-09 DIAGNOSIS — R9082 White matter disease, unspecified: Secondary | ICD-10-CM | POA: Diagnosis not present

## 2022-12-09 DIAGNOSIS — S0990XA Unspecified injury of head, initial encounter: Secondary | ICD-10-CM

## 2022-12-09 DIAGNOSIS — Z9181 History of falling: Secondary | ICD-10-CM | POA: Diagnosis not present

## 2022-12-09 DIAGNOSIS — W228XXA Striking against or struck by other objects, initial encounter: Secondary | ICD-10-CM | POA: Insufficient documentation

## 2022-12-09 DIAGNOSIS — R112 Nausea with vomiting, unspecified: Secondary | ICD-10-CM | POA: Diagnosis not present

## 2022-12-09 NOTE — ED Provider Notes (Signed)
[facility] Provider Note   CSN: [id number] Arrival date & time: 12/09/22  1550     History  No chief complaint on file.   Frances Ortiz is a 89 y.o. female.  89 yo F with a chief complaints of fall.  This occurred today.  Patient was trying to ambulate and she felt like her legs got weak and she collapsed to the ground.  She struck the back of her head on the ground.  Had 1 episode of vomiting.  She has chronic back pain and does not think that it is any worse.  She has some mild pain in her tailbone.  Has been able to ambulate since without issue.  She went to 3 different urgent cares today and eventually was sent to the ED for evaluation.        Home Medications Prior to Admission medications   Medication Sig Start Date End Date Taking? Authorizing Provider  Biotin 5 MG CAPS Take 5 mg by mouth daily.     [provider]  CALCIUM PO Take 1 tablet by mouth daily.    [provider]  Cholecalciferol (VITAMIN D3) 2000 UNITS capsule Take 2,000 Units by mouth daily.    [provider]  ketoconazole (NIZORAL) 2 % shampoo Apply topically. 07/07/20   [provider]  Multiple Vitamin (MULTIVITAMIN) tablet Take 1 tablet by mouth daily.     [provider]  sertraline (ZOLOFT) 50 MG tablet Take 50 mg by mouth daily.    [provider]      Allergies    Aristocort [triamcinolone], Cortisone, Erythromycin, Other, and Hydrocodone-acetaminophen    Review of Systems   Review of Systems  Physical Exam Updated Vital Signs BP (!) 167/82   Pulse 90   Temp 98.6 F (37 C)   Resp 16   SpO2 99%  Physical Exam Vitals and nursing note reviewed.  Constitutional:      General: She is not in acute distress.    Appearance: She is well-developed. She is not diaphoretic.  HENT:     Head: Normocephalic and atraumatic.  Eyes:     Pupils: Pupils are equal, round, and reactive to light.   Cardiovascular:     Rate and Rhythm: Normal rate and regular rhythm.     Heart sounds: No murmur heard.    No friction rub. No gallop.  Pulmonary:     Effort: Pulmonary effort is normal.     Breath sounds: No wheezing or rales.  Abdominal:     General: There is no distension.     Palpations: Abdomen is soft.     Tenderness: There is no abdominal tenderness.  Musculoskeletal:        General: No tenderness.     Cervical back: Normal range of motion and neck supple.  Skin:    General: Skin is warm and dry.  Neurological:     Mental Status: She is alert and oriented to person, place, and time.  Psychiatric:        Behavior: Behavior normal.     ED Results / Procedures / Treatments   Labs (all labs ordered are listed, but only abnormal results are displayed) Labs Reviewed - No data to display  EKG None  Radiology CT Head Wo Contrast  Result Date: 12/09/2022 CLINICAL DATA:  Fall EXAM: CT HEAD WITHOUT CONTRAST TECHNIQUE: Contiguous axial images were obtained from the base of the skull through the vertex without intravenous  contrast. RADIATION DOSE REDUCTION: This exam was performed according to the departmental dose-optimization program which includes automated exposure control, adjustment of the mA and/or kV according to patient size and/or use of iterative reconstruction technique. COMPARISON:  None Available. FINDINGS: Brain: No evidence of acute infarction, hemorrhage, hydrocephalus, extra-axial collection or mass lesion/mass effect. Periventricular and deep white matter hypodensity. Vascular: No hyperdense vessel or unexpected calcification. Skull: Normal. Negative for fracture or focal lesion. Sinuses/Orbits: Small air-fluid level of the left maxillary sinus. Other: None. IMPRESSION: 1. No acute intracranial pathology. Small-vessel white matter disease. 2. Small air-fluid level of the left maxillary sinus. Correlate for facial trauma or sinusitis. No included overlying fracture.  Electronically Signed   By: Delanna Ahmadi M.D.   On: 12/09/2022 18:03    Procedures Procedures    Medications Ordered in ED Medications - No data to display  ED Course/ Medical Decision Making/ A&P                             Medical Decision Making Amount and/or Complexity of Data Reviewed Radiology: ordered.   89 yo F with a chief complaint of a fall.  Nonsyncopal by history, patient complaining mostly of posterior head injury.  She had 1 episode of emesis immediately thereafter.  CT scan of the head was negative for intracranial hemorrhage.  No neck pain.  Complaining of some tailbone pain, offered to obtain plain films but she declined.  Will have her follow-up with her family doctor in the office.  The patients results and plan were reviewed and discussed.   Any x-rays performed were independently reviewed by myself.   Differential diagnosis were considered with the presenting HPI.  Medications - No data to display  Vitals:   12/09/22 1612  BP: (!) 167/82  Pulse: 90  Resp: 16  Temp: 98.6 F (37 C)  SpO2: 99%    Final diagnoses:  Injury of head, initial encounter    Admission/ observation were discussed with the admitting physician, patient and/or family and they are comfortable with the plan.          Final Clinical Impression(s) / ED Diagnoses Final diagnoses:  Injury of head, initial encounter    Rx / DC Orders ED Discharge Orders     None         Deno Etienne, DO 12/09/22 1841

## 2022-12-09 NOTE — ED Triage Notes (Signed)
Pt states she sprained her back 29yr ago, states she was going to the door to let guest in, "felt my legs give out & fell on the floor, onto bottom & then head- kerplunk." States she crawled to phone & crawling made her gag & she threw up. [facility]/t concern for concussion.  Ambulatory w walker

## 2022-12-09 NOTE — Discharge Instructions (Signed)
Please follow-up with your family doctor in the office.  When you fall this is a sign that there could be something wrong.  Typically your family doctor will see you to evaluate your medications to see if there is any they could stop, also evaluate you for possible physical therapy.  Your CT of your head was negative.  There have been some very rare cases in the research that some people have experienced bleeding inside their skull after a negative head CT.  If you develop worsening headache or uncontrolled vomiting then please return for repeat evaluation.

## 2023-01-14 DIAGNOSIS — Z6826 Body mass index (BMI) 26.0-26.9, adult: Secondary | ICD-10-CM | POA: Diagnosis not present

## 2023-01-14 DIAGNOSIS — M47816 Spondylosis without myelopathy or radiculopathy, lumbar region: Secondary | ICD-10-CM | POA: Diagnosis not present

## 2023-01-22 DIAGNOSIS — M545 Low back pain, unspecified: Secondary | ICD-10-CM | POA: Diagnosis not present

## 2023-01-24 DIAGNOSIS — M545 Low back pain, unspecified: Secondary | ICD-10-CM | POA: Diagnosis not present

## 2023-01-28 DIAGNOSIS — Z85820 Personal history of malignant melanoma of skin: Secondary | ICD-10-CM | POA: Diagnosis not present

## 2023-01-28 DIAGNOSIS — L814 Other melanin hyperpigmentation: Secondary | ICD-10-CM | POA: Diagnosis not present

## 2023-01-28 DIAGNOSIS — L821 Other seborrheic keratosis: Secondary | ICD-10-CM | POA: Diagnosis not present

## 2023-01-28 DIAGNOSIS — L570 Actinic keratosis: Secondary | ICD-10-CM | POA: Diagnosis not present

## 2023-01-28 DIAGNOSIS — Z85828 Personal history of other malignant neoplasm of skin: Secondary | ICD-10-CM | POA: Diagnosis not present

## 2023-01-29 DIAGNOSIS — M545 Low back pain, unspecified: Secondary | ICD-10-CM | POA: Diagnosis not present

## 2023-01-31 DIAGNOSIS — M545 Low back pain, unspecified: Secondary | ICD-10-CM | POA: Diagnosis not present

## 2023-02-01 DIAGNOSIS — M545 Low back pain, unspecified: Secondary | ICD-10-CM | POA: Diagnosis not present

## 2023-02-07 DIAGNOSIS — M4316 Spondylolisthesis, lumbar region: Secondary | ICD-10-CM | POA: Diagnosis not present

## 2023-02-07 DIAGNOSIS — S32030A Wedge compression fracture of third lumbar vertebra, initial encounter for closed fracture: Secondary | ICD-10-CM | POA: Diagnosis not present

## 2023-02-13 ENCOUNTER — Other Ambulatory Visit (HOSPITAL_COMMUNITY): Payer: Self-pay | Admitting: Neurosurgery

## 2023-02-13 DIAGNOSIS — S32030A Wedge compression fracture of third lumbar vertebra, initial encounter for closed fracture: Secondary | ICD-10-CM

## 2023-02-15 ENCOUNTER — Ambulatory Visit (HOSPITAL_COMMUNITY)
Admission: RE | Admit: 2023-02-15 | Discharge: 2023-02-15 | Disposition: A | Discharge status: Home / Self Care | Payer: Medicare Other | Source: Ambulatory Visit | Attending: Neurosurgery | Admitting: Neurosurgery

## 2023-02-15 DIAGNOSIS — S32030A Wedge compression fracture of third lumbar vertebra, initial encounter for closed fracture: Secondary | ICD-10-CM | POA: Diagnosis not present

## 2023-02-15 DIAGNOSIS — M48061 Spinal stenosis, lumbar region without neurogenic claudication: Secondary | ICD-10-CM | POA: Diagnosis not present

## 2023-02-15 DIAGNOSIS — M47816 Spondylosis without myelopathy or radiculopathy, lumbar region: Secondary | ICD-10-CM | POA: Diagnosis not present

## 2023-02-15 DIAGNOSIS — S32020A Wedge compression fracture of second lumbar vertebra, initial encounter for closed fracture: Secondary | ICD-10-CM | POA: Diagnosis not present

## 2023-02-15 DIAGNOSIS — M5126 Other intervertebral disc displacement, lumbar region: Secondary | ICD-10-CM | POA: Diagnosis not present

## 2023-02-15 DIAGNOSIS — M545 Low back pain, unspecified: Secondary | ICD-10-CM | POA: Diagnosis not present

## 2023-02-21 DIAGNOSIS — M549 Dorsalgia, unspecified: Secondary | ICD-10-CM | POA: Diagnosis not present

## 2023-02-21 DIAGNOSIS — F329 Major depressive disorder, single episode, unspecified: Secondary | ICD-10-CM | POA: Diagnosis not present

## 2023-02-22 ENCOUNTER — Ambulatory Visit (HOSPITAL_COMMUNITY): Admission: RE | Admit: 2023-02-22 | Payer: Medicare Other | Source: Ambulatory Visit

## 2023-02-25 ENCOUNTER — Encounter (HOSPITAL_COMMUNITY): Payer: Self-pay

## 2023-02-25 ENCOUNTER — Other Ambulatory Visit: Payer: Self-pay

## 2023-02-25 ENCOUNTER — Inpatient Hospital Stay (HOSPITAL_COMMUNITY)
Admission: EM | Admit: 2023-02-25 | Discharge: 2023-03-02 | DRG: 479 | Disposition: A | Discharge status: Skilled Nursing Facility | Payer: Medicare Other | Attending: Internal Medicine | Admitting: Internal Medicine

## 2023-02-25 DIAGNOSIS — M8008XA Age-related osteoporosis with current pathological fracture, vertebra(e), initial encounter for fracture: Principal | ICD-10-CM | POA: Diagnosis present

## 2023-02-25 DIAGNOSIS — S32030A Wedge compression fracture of third lumbar vertebra, initial encounter for closed fracture: Secondary | ICD-10-CM | POA: Diagnosis not present

## 2023-02-25 DIAGNOSIS — Z85820 Personal history of malignant melanoma of skin: Secondary | ICD-10-CM | POA: Diagnosis not present

## 2023-02-25 DIAGNOSIS — S32039A Unspecified fracture of third lumbar vertebra, initial encounter for closed fracture: Secondary | ICD-10-CM | POA: Diagnosis not present

## 2023-02-25 DIAGNOSIS — Z79899 Other long term (current) drug therapy: Secondary | ICD-10-CM | POA: Diagnosis not present

## 2023-02-25 DIAGNOSIS — I10 Essential (primary) hypertension: Secondary | ICD-10-CM | POA: Diagnosis present

## 2023-02-25 DIAGNOSIS — Z885 Allergy status to narcotic agent status: Secondary | ICD-10-CM

## 2023-02-25 DIAGNOSIS — S32020A Wedge compression fracture of second lumbar vertebra, initial encounter for closed fracture: Secondary | ICD-10-CM | POA: Diagnosis not present

## 2023-02-25 DIAGNOSIS — Z9181 History of falling: Secondary | ICD-10-CM

## 2023-02-25 DIAGNOSIS — Z9049 Acquired absence of other specified parts of digestive tract: Secondary | ICD-10-CM | POA: Diagnosis not present

## 2023-02-25 DIAGNOSIS — C180 Malignant neoplasm of cecum: Secondary | ICD-10-CM | POA: Diagnosis not present

## 2023-02-25 DIAGNOSIS — Z85828 Personal history of other malignant neoplasm of skin: Secondary | ICD-10-CM

## 2023-02-25 DIAGNOSIS — Z961 Presence of intraocular lens: Secondary | ICD-10-CM | POA: Diagnosis present

## 2023-02-25 DIAGNOSIS — W19XXXA Unspecified fall, initial encounter: Secondary | ICD-10-CM | POA: Diagnosis not present

## 2023-02-25 DIAGNOSIS — Z66 Do not resuscitate: Secondary | ICD-10-CM | POA: Diagnosis not present

## 2023-02-25 DIAGNOSIS — S32000D Wedge compression fracture of unspecified lumbar vertebra, subsequent encounter for fracture with routine healing: Secondary | ICD-10-CM | POA: Diagnosis not present

## 2023-02-25 DIAGNOSIS — E785 Hyperlipidemia, unspecified: Secondary | ICD-10-CM | POA: Diagnosis present

## 2023-02-25 DIAGNOSIS — S32029A Unspecified fracture of second lumbar vertebra, initial encounter for closed fracture: Secondary | ICD-10-CM | POA: Diagnosis not present

## 2023-02-25 DIAGNOSIS — M48061 Spinal stenosis, lumbar region without neurogenic claudication: Secondary | ICD-10-CM | POA: Diagnosis not present

## 2023-02-25 DIAGNOSIS — M549 Dorsalgia, unspecified: Secondary | ICD-10-CM | POA: Diagnosis not present

## 2023-02-25 DIAGNOSIS — G4700 Insomnia, unspecified: Secondary | ICD-10-CM | POA: Diagnosis not present

## 2023-02-25 DIAGNOSIS — F419 Anxiety disorder, unspecified: Secondary | ICD-10-CM | POA: Diagnosis present

## 2023-02-25 DIAGNOSIS — E860 Dehydration: Secondary | ICD-10-CM | POA: Diagnosis present

## 2023-02-25 DIAGNOSIS — W19XXXD Unspecified fall, subsequent encounter: Secondary | ICD-10-CM | POA: Diagnosis not present

## 2023-02-25 DIAGNOSIS — K219 Gastro-esophageal reflux disease without esophagitis: Secondary | ICD-10-CM | POA: Diagnosis not present

## 2023-02-25 DIAGNOSIS — Z602 Problems related to living alone: Secondary | ICD-10-CM | POA: Diagnosis present

## 2023-02-25 DIAGNOSIS — E559 Vitamin D deficiency, unspecified: Secondary | ICD-10-CM | POA: Diagnosis not present

## 2023-02-25 DIAGNOSIS — Z7401 Bed confinement status: Secondary | ICD-10-CM | POA: Diagnosis not present

## 2023-02-25 DIAGNOSIS — S32000A Wedge compression fracture of unspecified lumbar vertebra, initial encounter for closed fracture: Secondary | ICD-10-CM

## 2023-02-25 DIAGNOSIS — Z881 Allergy status to other antibiotic agents status: Secondary | ICD-10-CM | POA: Diagnosis not present

## 2023-02-25 DIAGNOSIS — M810 Age-related osteoporosis without current pathological fracture: Secondary | ICD-10-CM | POA: Diagnosis not present

## 2023-02-25 DIAGNOSIS — F331 Major depressive disorder, recurrent, moderate: Secondary | ICD-10-CM | POA: Diagnosis not present

## 2023-02-25 DIAGNOSIS — Z9189 Other specified personal risk factors, not elsewhere classified: Secondary | ICD-10-CM

## 2023-02-25 DIAGNOSIS — S32010A Wedge compression fracture of first lumbar vertebra, initial encounter for closed fracture: Secondary | ICD-10-CM | POA: Diagnosis not present

## 2023-02-25 DIAGNOSIS — F32A Depression, unspecified: Secondary | ICD-10-CM | POA: Diagnosis not present

## 2023-02-25 DIAGNOSIS — Z7409 Other reduced mobility: Secondary | ICD-10-CM | POA: Diagnosis present

## 2023-02-25 DIAGNOSIS — R52 Pain, unspecified: Secondary | ICD-10-CM | POA: Diagnosis not present

## 2023-02-25 DIAGNOSIS — S32000S Wedge compression fracture of unspecified lumbar vertebra, sequela: Secondary | ICD-10-CM

## 2023-02-25 DIAGNOSIS — M898X8 Other specified disorders of bone, other site: Secondary | ICD-10-CM | POA: Diagnosis not present

## 2023-02-25 DIAGNOSIS — M5126 Other intervertebral disc displacement, lumbar region: Secondary | ICD-10-CM | POA: Diagnosis not present

## 2023-02-25 DIAGNOSIS — Z888 Allergy status to other drugs, medicaments and biological substances status: Secondary | ICD-10-CM

## 2023-02-25 DIAGNOSIS — Z85038 Personal history of other malignant neoplasm of large intestine: Secondary | ICD-10-CM | POA: Diagnosis not present

## 2023-02-25 LAB — BASIC METABOLIC PANEL
Anion gap: 11 (ref 5–15)
BUN: 26 mg/dL — ABNORMAL HIGH (ref 8–23)
CO2: 24 mmol/L (ref 22–32)
Calcium: 9.3 mg/dL (ref 8.9–10.3)
Chloride: 103 mmol/L (ref 98–111)
Creatinine, Ser: 0.64 mg/dL (ref 0.44–1.00)
GFR, Estimated: >60 mL/min (ref >60)
Glucose, Bld: 113 mg/dL — ABNORMAL HIGH (ref 70–99)
Potassium: 4 mmol/L (ref 3.5–5.1)
Sodium: 138 mmol/L (ref 135–145)

## 2023-02-25 LAB — CBC WITH DIFFERENTIAL/PLATELET
Abs Immature Granulocytes: 0.03 10*3/uL (ref 0.00–0.07)
Basophils Absolute: 0.1 10*3/uL (ref 0.0–0.1)
Basophils Relative: 1 %
Eosinophils Absolute: 0.2 10*3/uL (ref 0.0–0.5)
Eosinophils Relative: 2 %
HCT: 41 % (ref 36.0–46.0)
Hemoglobin: 14 g/dL (ref 12.0–15.0)
Immature Granulocytes: 0 %
Lymphocytes Relative: 19 %
Lymphs Abs: 1.9 10*3/uL (ref 0.7–4.0)
MCH: 32.6 pg (ref 26.0–34.0)
MCHC: 34.1 g/dL (ref 30.0–36.0)
MCV: 95.6 fL (ref 80.0–100.0)
Monocytes Absolute: 0.8 10*3/uL (ref 0.1–1.0)
Monocytes Relative: 8 %
Neutro Abs: 6.9 10*3/uL (ref 1.7–7.7)
Neutrophils Relative %: 70 %
Platelets: 338 10*3/uL (ref 150–400)
RBC: 4.29 MIL/uL (ref 3.87–5.11)
RDW: 11.9 % (ref 11.5–15.5)
WBC: 9.9 10*3/uL (ref 4.0–10.5)
nRBC: 0 % (ref 0.0–0.2)

## 2023-02-25 LAB — PROTIME-INR
INR: 1.1 (ref 0.8–1.2)
Prothrombin Time: 14.2 seconds (ref 11.4–15.2)

## 2023-02-25 MED ORDER — ACETAMINOPHEN 325 MG PO TABS: 650.0000 mg | ORAL_TABLET | Freq: Four times a day (QID) | ORAL | Status: DC | PRN

## 2023-02-25 MED ORDER — FENTANYL CITRATE PF 50 MCG/ML IJ SOSY
50.0000 ug | PREFILLED_SYRINGE | INTRAMUSCULAR | Status: DC | PRN
  Administered 2023-02-25: 50 ug via INTRAVENOUS
  Filled 2023-02-25: qty 1

## 2023-02-25 MED ORDER — ONDANSETRON HCL 4 MG PO TABS: 4.0000 mg | ORAL_TABLET | Freq: Four times a day (QID) | ORAL | Status: DC | PRN

## 2023-02-25 MED ORDER — SENNOSIDES-DOCUSATE SODIUM 8.6-50 MG PO TABS: 1.0000 | ORAL_TABLET | Freq: Every evening | ORAL | Status: DC | PRN

## 2023-02-25 MED ORDER — HYDROMORPHONE HCL 1 MG/ML IJ SOLN
1.0000 mg | INTRAMUSCULAR | Status: DC | PRN
  Administered 2023-02-26: 1 mg via INTRAVENOUS
  Filled 2023-02-25: qty 1

## 2023-02-25 MED ORDER — ENOXAPARIN SODIUM 40 MG/0.4ML IJ SOSY
40.0000 mg | PREFILLED_SYRINGE | Freq: Every day | INTRAMUSCULAR | Status: DC
Start: 2023-02-26 — End: 2023-02-26
  Administered 2023-02-26: 40 mg via SUBCUTANEOUS
  Filled 2023-02-25: qty 0.4

## 2023-02-25 MED ORDER — NALOXONE HCL 0.4 MG/ML IJ SOLN: 0.4000 mg | INTRAMUSCULAR | Status: DC | PRN

## 2023-02-25 MED ORDER — MELATONIN 5 MG PO TABS: 5.0000 mg | ORAL_TABLET | Freq: Every evening | ORAL | Status: DC | PRN

## 2023-02-25 MED ORDER — ONDANSETRON HCL 4 MG/2ML IJ SOLN: 4.0000 mg | Freq: Four times a day (QID) | INTRAMUSCULAR | Status: DC | PRN

## 2023-02-25 MED ORDER — FENTANYL CITRATE PF 50 MCG/ML IJ SOSY
50.0000 ug | PREFILLED_SYRINGE | Freq: Once | INTRAMUSCULAR | Status: AC
  Administered 2023-02-25: 50 ug via INTRAVENOUS
  Filled 2023-02-25: qty 1

## 2023-02-25 MED ORDER — SERTRALINE HCL 50 MG PO TABS
50.0000 mg | ORAL_TABLET | Freq: Every day | ORAL | Status: DC
  Administered 2023-02-26 – 2023-03-01 (×4): 50 mg via ORAL
  Filled 2023-02-25 (×4): qty 1

## 2023-02-25 MED ORDER — LACTATED RINGERS IV SOLN: INTRAVENOUS | Status: DC

## 2023-02-25 MED ORDER — ACETAMINOPHEN 650 MG RE SUPP: 650.0000 mg | Freq: Four times a day (QID) | RECTAL | Status: DC | PRN

## 2023-02-25 MED ORDER — LIDOCAINE 5 % EX PTCH
1.0000 | MEDICATED_PATCH | CUTANEOUS | Status: DC
  Administered 2023-02-25 – 2023-02-28 (×4): 1 via TRANSDERMAL
  Filled 2023-02-25 (×5): qty 1

## 2023-02-25 NOTE — ED Triage Notes (Signed)
Pt coming from home due to pain control from a fall.Sitting at bottom of recliner trying ot obtain relief. Bed bound until surgery is performed. Broke L2 and L3 in February was able to walk to 2 miles per day, still waiting on surgery  148/80 Hr 92 18 rr  98%

## 2023-02-25 NOTE — ED Notes (Signed)
Pt brief changed. Pt had one episode of urination.

## 2023-02-25 NOTE — H&P (Signed)
PCP:   Clovis Riley, L.August Saucer, MD   Chief Complaint:  Intractable back pain  HPI: This is a 89 year old female with past medical history of cecal cancer, patient status post right colectomy.  In the first week of February, patient fell.  She saw her PCP who started on PT twice weekly.  Patient immediately after PT her pain would improve but it was worse the next day.  Every PC visit got worse, eventually the physical therapist redirected her to see her PCP.  She was sent to pain management where she received injections and nerve ablation that did not work.  She then saw neurosurgeon Dr. Wynetta Emery.  MRI L spine done showed L2/3 acute superior endplate compression deformities and moderate to severe spinal canal narrowing.  Discussion re kyphoplasty was initiated.  Patient's pain significantly worsened.  She reports severe pain with long sitting, lying down and standing.  She states her low back is one solid pain.  She denies any loss of bowel or bladder control, or saddle paresthesia.  She denies any constant distal neurological symptoms.  She came to the ER.  Per patient she has been bedbound for approximate last 2 to 3 weeks despite taking Vicodin every 6 hours.    Patient lives alone.  She denies any chest pains, CAD or history of CHF.  She states she does all of her ADLs, cleaning and gardening.  In the ER orthopedics on-call.  Admission requested  Review of Systems:  HPI  Past Medical History: Past Medical History:  Diagnosis Date   Anxiety    GERD (gastroesophageal reflux disease)    Heart murmur    HLD (hyperlipidemia)    Intestinal obstruction (HCC)    Osteoporosis    PONV (postoperative nausea and vomiting)    Skin cancer    melanoma, basal cell, and squamous   Past Surgical History:  Procedure Laterality Date   BREAST BIOPSY     bilateral   BREAST EXCISIONAL BIOPSY Right 30 years ago   fibroadenoma   BREAST EXCISIONAL BIOPSY Left 40 years ago   sclerosis thickening   CATARACT  EXTRACTION W/ INTRAOCULAR LENS  IMPLANT, BILATERAL Bilateral    CESAREAN SECTION     x 2   CHOLECYSTECTOMY N/A 11/27/2012   Procedure: LAPAROSCOPIC CHOLECYSTECTOMY WITH INTRAOPERATIVE CHOLANGIOGRAM;  Surgeon: Robyne Askew, MD;  Location: [facility] OR;  Service: General;  Laterality: N/A;   COLECTOMY Right 10/07/2017   right   COLONOSCOPY  2005   COLONOSCOPY  08/2017   "found mass in cecum"   DILATION AND CURETTAGE OF UTERUS     ESOPHAGOGASTRODUODENOSCOPY     LAPAROSCOPIC RIGHT COLECTOMY Right 10/07/2017   Procedure: LAPAROSCOPIC RIGHT COLECTOMY ERAS PATHWAY;  Surgeon: Griselda Miner, MD;  Location: [facility] OR;  Service: General;  Laterality: Right;   SKIN CANCER EXCISION     TONSILLECTOMY      Medications: Prior to Admission medications   Medication Sig Start Date End Date Taking? Authorizing Provider  HYDROcodone-acetaminophen (NORCO/VICODIN) 5-325 MG tablet Take 1 tablet by mouth every 6 (six) hours as needed for moderate pain.   Yes [provider]  sertraline (ZOLOFT) 50 MG tablet Take 50 mg by mouth daily.   Yes [provider]  Cholecalciferol (VITAMIN D3) 2000 UNITS capsule Take 2,000 Units by mouth daily.    [provider]  Multiple Vitamin (MULTIVITAMIN) tablet Take 1 tablet by mouth daily.     [provider]    Allergies:   Allergies  Allergen  Reactions   Aristocort [Triamcinolone] Other (See Comments)    Emotional instability   Cortisone Other (See Comments)    Emotional instability   Erythromycin Other (See Comments)    Stomach pain    Other Other (See Comments)    Epinephrine in novocain makes heart race.   Hydrocodone-Acetaminophen Rash    Social History:  reports that she has never smoked. She has never used smokeless tobacco. She reports that she does not drink alcohol and does not use drugs.  Family History: Family History  Problem Relation Age of Onset   Pneumonia Mother    Colon cancer Neg Hx    Colon polyps Neg Hx     Rectal cancer Neg Hx    Stomach cancer Neg Hx     Physical Exam: Vitals:   02/25/23 1533 02/25/23 1534 02/25/23 1949  BP: (!) 143/71  139/73  Pulse: 88  92  Resp: 16  16  Temp: 98.2 F (36.8 C)  97.6 F (36.4 C)  TempSrc: Oral  Oral  SpO2: 93%  95%  Weight:  61 kg   Height:  4\' 11"  (1.499 m)     General:  Alert and oriented times three, well developed and nourished, no acute distress Eyes: PERRLA, pink conjunctiva, no scleral icterus ENT: Moist oral mucosa, neck supple, no thyromegaly Lungs: clear to ascultation, no wheeze, no crackles, no use of accessory muscles Cardiovascular: regular rate and rhythm, no regurgitation, no gallops, no murmurs. No carotid bruits, no JVD Abdomen: soft, positive BS, non-tender, non-distended, no organomegaly, not an acute abdomen GU: not examined Neuro: CN II - XII grossly intact, sensation intact Musculoskeletal: Lower extremity strength not assessed due to pain Skin: no rash, no subcutaneous crepitation, no decubitus Psych: appropriate patient   Labs on Admission:  Recent Labs    02/25/23 1920  NA 138  K 4.0  CL 103  CO2 24  GLUCOSE 113*  BUN 26*  CREATININE 0.64  CALCIUM 9.3    Recent Labs    02/25/23 1920  WBC 9.9  NEUTROABS 6.9  HGB 14.0  HCT 41.0  MCV 95.6  PLT 338    Radiological Exams on Admission: No results found.  Assessment/Plan Present on Admission:  Pain control/L2-3 superior endplate compression/L2-3 moderate to severe spinal canal stenosis/history of Fall -Dilaudid, Roxicodone as needed pain -N.p.o., IV fluid hydration -Orthopedics Dr. Wynetta Emery will see patient in the morning -Patient low surgical risks.  METS > 4  Cecal cancer (HCC) History of right colectomy  Frances Ortiz 02/25/2023, 10:42 PM

## 2023-02-25 NOTE — ED Notes (Signed)
Son is going home, number in chart, please call is there are any issues.

## 2023-02-25 NOTE — ED Notes (Signed)
ED TO INPATIENT HANDOFF REPORT  ED Nurse Name and Phone #:  Gillis Ends #[phone]  S Name/Age/Gender Frances Ortiz 89 y.o. female Room/Bed: 036C/036C  Code Status   Code Status: Prior  Home/SNF/Other Rehab Patient oriented to: self, place, time, and situation Is this baseline? Yes   Triage Complete: Triage complete  Chief Complaint Inadequate pain control [R52]  Triage Note Pt coming from home due to pain control from a fall.Sitting at bottom of recliner trying ot obtain relief. Bed bound until surgery is performed. Broke L2 and L3 in February was able to walk to 2 miles per day, still waiting on surgery  148/80 Hr 92 18 rr  98%     Allergies Allergies  Allergen Reactions   Aristocort [Triamcinolone] Other (See Comments)    Emotional instability   Cortisone Other (See Comments)    Emotional instability   Erythromycin Other (See Comments)    Stomach pain    Other Other (See Comments)    Epinephrine in novocain makes heart race.   Hydrocodone-Acetaminophen Rash    Level of Care/Admitting Diagnosis ED Disposition     ED Disposition  Admit   Condition  --   Comment  Hospital Area: [facility] [[id number]]  Level of Care: Telemetry Medical [104]  May admit patient to [facility] or [facility] if equivalent level of care is available:: No  Covid Evaluation: Confirmed COVID Negative  Diagnosis: Inadequate pain control [720056]  Admitting Physician: Gery Pray [[id number]]  Attending Physician: Gery Pray [[id number]]  Certification:: I certify this patient will need inpatient services for at least 2 midnights  Estimated Length of Stay: 2          B Medical/Surgery History Past Medical History:  Diagnosis Date   Anxiety    GERD (gastroesophageal reflux disease)    Heart murmur    HLD (hyperlipidemia)    Intestinal obstruction (HCC)    Osteoporosis    PONV (postoperative nausea and vomiting)    Skin cancer    melanoma, basal cell, and  squamous   Past Surgical History:  Procedure Laterality Date   BREAST BIOPSY     bilateral   BREAST EXCISIONAL BIOPSY Right 30 years ago   fibroadenoma   BREAST EXCISIONAL BIOPSY Left 40 years ago   sclerosis thickening   CATARACT EXTRACTION W/ INTRAOCULAR LENS  IMPLANT, BILATERAL Bilateral    CESAREAN SECTION     x 2   CHOLECYSTECTOMY N/A 11/27/2012   Procedure: LAPAROSCOPIC CHOLECYSTECTOMY WITH INTRAOPERATIVE CHOLANGIOGRAM;  Surgeon: Robyne Askew, MD;  Location: [facility] OR;  Service: General;  Laterality: N/A;   COLECTOMY Right 10/07/2017   right   COLONOSCOPY  2005   COLONOSCOPY  08/2017   "found mass in cecum"   DILATION AND CURETTAGE OF UTERUS     ESOPHAGOGASTRODUODENOSCOPY     LAPAROSCOPIC RIGHT COLECTOMY Right 10/07/2017   Procedure: LAPAROSCOPIC RIGHT COLECTOMY ERAS PATHWAY;  Surgeon: Griselda Miner, MD;  Location: [facility] OR;  Service: General;  Laterality: Right;   SKIN CANCER EXCISION     TONSILLECTOMY       A IV Location/Drains/Wounds Patient Lines/Drains/Airways Status     Active Line/Drains/Airways     Name Placement date Placement time Site Days   Peripheral IV 02/25/23 20 G Anterior;Left Hand 02/25/23  1918  Hand  less than 1   Incision - 4 Ports Abdomen 1: Umbilicus 2: Mid;Upper 3: Right;Medial 4: Right;Lateral 11/27/12  1102  -- 3742  Intake/Output Last 24 hours No intake or output data in the 24 hours ending 02/25/23 2309  Labs/Imaging Results for orders placed or performed during the hospital encounter of 02/25/23 (from the past 48 hour(s))  CBC with Differential     Status: None   Collection Time: 02/25/23  7:20 PM  Result Value Ref Range   WBC 9.9 4.0 - 10.5 K/uL   RBC 4.29 3.87 - 5.11 MIL/uL   Hemoglobin 14.0 12.0 - 15.0 g/dL   HCT 16.1 09.6 - 04.5 %   MCV 95.6 80.0 - 100.0 fL   MCH 32.6 26.0 - 34.0 pg   MCHC 34.1 30.0 - 36.0 g/dL   RDW 40.9 81.1 - 91.4 %   Platelets 338 150 - 400 K/uL   nRBC 0.0 0.0 - 0.2 %   Neutrophils Relative  % 70 %   Neutro Abs 6.9 1.7 - 7.7 K/uL   Lymphocytes Relative 19 %   Lymphs Abs 1.9 0.7 - 4.0 K/uL   Monocytes Relative 8 %   Monocytes Absolute 0.8 0.1 - 1.0 K/uL   Eosinophils Relative 2 %   Eosinophils Absolute 0.2 0.0 - 0.5 K/uL   Basophils Relative 1 %   Basophils Absolute 0.1 0.0 - 0.1 K/uL   Immature Granulocytes 0 %   Abs Immature Granulocytes 0.03 0.00 - 0.07 K/uL    Comment: Performed at [facility], [address], [place], [place] [address]  Basic metabolic panel     Status: Abnormal   Collection Time: 02/25/23  7:20 PM  Result Value Ref Range   Sodium 138 135 - 145 mmol/L   Potassium 4.0 3.5 - 5.1 mmol/L   Chloride 103 98 - 111 mmol/L   CO2 24 22 - 32 mmol/L   Glucose, Bld 113 (H) 70 - 99 mg/dL    Comment: Glucose reference range applies only to samples taken after fasting for at least 8 hours.   BUN 26 (H) 8 - 23 mg/dL   Creatinine, Ser 6.21 0.44 - 1.00 mg/dL   Calcium 9.3 8.9 - 30.8 mg/dL   GFR, Estimated >65 >78 mL/min    Comment: (NOTE) Calculated using the CKD-EPI Creatinine Equation (2021)    Anion gap 11 5 - 15    Comment: Performed at [facility], [address] [place], [place] [address]  Protime-INR     Status: None   Collection Time: 02/25/23  7:20 PM  Result Value Ref Range   Prothrombin Time 14.2 11.4 - 15.2 seconds   INR 1.1 0.8 - 1.2    Comment: (NOTE) INR goal varies based on device and disease states. Performed at [facility], [address], [place], [place] [address]    No results found.  Pending Labs Unresulted Labs (From admission, onward)    None       Vitals/Pain Today's Vitals   02/25/23 1534 02/25/23 1924 02/25/23 1942 02/25/23 1949  BP:    139/73  Pulse:    92  Resp:    16  Temp:    97.6 F (36.4 C)  TempSrc:    Oral  SpO2:    95%  Weight: 61 kg     Height: 4\' 11"  (1.499 m)     PainSc: 10-Worst pain ever 10-Worst pain ever 2      Isolation Precautions No active  isolations  Medications Medications  fentaNYL (SUBLIMAZE) injection 50 mcg (has no administration in time range)  HYDROmorphone (DILAUDID) injection 1 mg (  has no administration in time range)  lidocaine (LIDODERM) 5 % 1 patch (has no administration in time range)  lactated ringers infusion (has no administration in time range)  fentaNYL (SUBLIMAZE) injection 50 mcg (50 mcg Intravenous Given 02/25/23 1923)    Mobility non-ambulatory     Focused Assessments     R Recommendations: See Admitting Provider Note  Report given to:   Additional Notes:

## 2023-02-25 NOTE — ED Provider Notes (Signed)
[place] [facility] AT [facility] Provider Note   CSN: [id number] Arrival date & time: 02/25/23  1522     History  Chief Complaint  Patient presents with   Back Pain    ALIAHNA WEINGARTEN is a 89 y.o. female.  HPI    89 year old female comes in with chief complaint of back pain. Patient has past medical history of osteoporosis, hyperlipidemia and has known spine fracture.  Patient was initially seen by Dr. Wynetta Emery.  She indicates that she did not hear from Dr. [facility] clinic in timely fashion after the MRI, therefore she proceeded to schedule surgery with another neurosurgeon.  She is supposed to get kyphoplasty.  The surgery still not scheduled, however she is having excruciating pain.  She is taking Vicodin's every 6 hours, but now is pretty much bedbound because of the pain.  Patient called her son, brought her to the emergency room. Patient willing to proceed with cone surgeon or interventional radiologist.  Pt has no associated numbness, weakness, urinary incontinence, urinary retention, bowel incontinence, pins and needle sensation in the perineal area.   Home Medications Prior to Admission medications   Medication Sig Start Date End Date Taking? Authorizing Provider  Biotin 5 MG CAPS Take 5 mg by mouth daily.     [provider]  CALCIUM PO Take 1 tablet by mouth daily.    [provider]  Cholecalciferol (VITAMIN D3) 2000 UNITS capsule Take 2,000 Units by mouth daily.    [provider]  ketoconazole (NIZORAL) 2 % shampoo Apply topically. 07/07/20   [provider]  Multiple Vitamin (MULTIVITAMIN) tablet Take 1 tablet by mouth daily.     [provider]  sertraline (ZOLOFT) 50 MG tablet Take 50 mg by mouth daily.    [provider]      Allergies    Aristocort [triamcinolone], Cortisone, Erythromycin, Other, and Hydrocodone-acetaminophen    Review of Systems   Review of Systems  All other  systems reviewed and are negative.   Physical Exam Updated Vital Signs BP 139/73   Pulse 92   Temp 97.6 F (36.4 C) (Oral)   Resp 16   Ht 4\' 11"  (1.499 m)   Wt 61 kg   SpO2 95%   BMI 27.16 kg/m  Physical Exam Vitals and nursing note reviewed.  Constitutional:      General: She is in acute distress.     Appearance: She is well-developed. She is ill-appearing.     Comments: Distress secondary to pain  HENT:     Head: Atraumatic.  Cardiovascular:     Rate and Rhythm: Normal rate.  Pulmonary:     Effort: Pulmonary effort is normal.  Musculoskeletal:     Cervical back: Normal range of motion and neck supple.  Skin:    General: Skin is warm and dry.  Neurological:     Mental Status: She is alert and oriented to person, place, and time.     ED Results / Procedures / Treatments   Labs (all labs ordered are listed, but only abnormal results are displayed) Labs Reviewed  BASIC METABOLIC PANEL - Abnormal; Notable for the following components:      Result Value   Glucose, Bld 113 (*)    BUN 26 (*)    All other components within normal limits  CBC WITH DIFFERENTIAL/PLATELET  PROTIME-INR    EKG None  Radiology No results found.  Procedures Procedures    Medications Ordered in ED Medications  fentaNYL (SUBLIMAZE) injection 50 mcg (50 mcg Intravenous Given 02/25/23 1923)    ED Course/ Medical Decision Making/ A&P                             Medical Decision Making Amount and/or Complexity of Data Reviewed Labs: ordered.  Risk Prescription drug management. Decision regarding hospitalization.   89 year old patient comes in with chief complaint of severe back pain.  Patient had a mechanical fall few days ago.  She ended up having severe spine injury from it.  She has been taking pain medication, however pain has worsened and now she has become bedbound.  She comes to the emergency room desperate for pain relief and definitive care.  Patient had seen Dr.  Wynetta Emery initially.  However at some point she switched the surgeons -and is currently awaiting kyphoplasty to be completed by surgeon affiliated with [facility].  At this point patient is willing to continue her care with [facility] health.  I discussed the case with neurosurgery.  They requested that medicine admit the patient.  Dr. Wynetta Emery will round on the patient tomorrow and see if patient can be taken to the OR tomorrow or sometime later this week.  Final Clinical Impression(s) / ED Diagnoses Final diagnoses:  Displacement of lumbar intervertebral disc without myelopathy  Compression fracture of lumbar vertebra, sequela    Rx / DC Orders ED Discharge Orders     None         Derwood Kaplan, MD 02/25/23 1954

## 2023-02-26 DIAGNOSIS — R52 Pain, unspecified: Secondary | ICD-10-CM | POA: Diagnosis not present

## 2023-02-26 LAB — CBC WITH DIFFERENTIAL/PLATELET
Abs Immature Granulocytes: 0.03 10*3/uL (ref 0.00–0.07)
Basophils Absolute: 0.1 10*3/uL (ref 0.0–0.1)
Basophils Relative: 1 %
Eosinophils Absolute: 0.2 10*3/uL (ref 0.0–0.5)
Eosinophils Relative: 3 %
HCT: 41.9 % (ref 36.0–46.0)
Hemoglobin: 14.1 g/dL (ref 12.0–15.0)
Immature Granulocytes: 0 %
Lymphocytes Relative: 19 %
Lymphs Abs: 1.7 10*3/uL (ref 0.7–4.0)
MCH: 32.3 pg (ref 26.0–34.0)
MCHC: 33.7 g/dL (ref 30.0–36.0)
MCV: 96.1 fL (ref 80.0–100.0)
Monocytes Absolute: 0.7 10*3/uL (ref 0.1–1.0)
Monocytes Relative: 8 %
Neutro Abs: 6.1 10*3/uL (ref 1.7–7.7)
Neutrophils Relative %: 69 %
Platelets: 335 10*3/uL (ref 150–400)
RBC: 4.36 MIL/uL (ref 3.87–5.11)
RDW: 11.9 % (ref 11.5–15.5)
WBC: 8.8 10*3/uL (ref 4.0–10.5)
nRBC: 0 % (ref 0.0–0.2)

## 2023-02-26 LAB — BASIC METABOLIC PANEL
Anion gap: 9 (ref 5–15)
BUN: 24 mg/dL — ABNORMAL HIGH (ref 8–23)
CO2: 25 mmol/L (ref 22–32)
Calcium: 9.2 mg/dL (ref 8.9–10.3)
Chloride: 103 mmol/L (ref 98–111)
Creatinine, Ser: 0.49 mg/dL (ref 0.44–1.00)
GFR, Estimated: >60 mL/min (ref >60)
Glucose, Bld: 114 mg/dL — ABNORMAL HIGH (ref 70–99)
Potassium: 3.8 mmol/L (ref 3.5–5.1)
Sodium: 137 mmol/L (ref 135–145)

## 2023-02-26 LAB — MAGNESIUM: Magnesium: 2.3 mg/dL (ref 1.7–2.4)

## 2023-02-26 LAB — PROTIME-INR
INR: 1.1 (ref 0.8–1.2)
Prothrombin Time: 14.1 seconds (ref 11.4–15.2)

## 2023-02-26 MED ORDER — POLYETHYLENE GLYCOL 3350 17 G PO PACK: 17.0000 g | PACK | Freq: Every day | ORAL | Status: DC | PRN

## 2023-02-26 MED ORDER — SODIUM CHLORIDE 0.9 % IV SOLN: INTRAVENOUS | Status: DC

## 2023-02-26 MED ORDER — ACETAMINOPHEN 500 MG PO TABS
1000.0000 mg | ORAL_TABLET | Freq: Three times a day (TID) | ORAL | Status: DC
  Administered 2023-02-26 – 2023-03-01 (×12): 1000 mg via ORAL
  Filled 2023-02-26 (×12): qty 2

## 2023-02-26 MED ORDER — OXYCODONE HCL 5 MG PO TABS
5.0000 mg | ORAL_TABLET | Freq: Four times a day (QID) | ORAL | Status: AC
  Administered 2023-02-26 – 2023-03-01 (×12): 5 mg via ORAL
  Filled 2023-02-26 (×12): qty 1

## 2023-02-26 MED ORDER — SENNOSIDES-DOCUSATE SODIUM 8.6-50 MG PO TABS
1.0000 | ORAL_TABLET | Freq: Every day | ORAL | Status: DC
  Administered 2023-02-26 – 2023-02-27 (×2): 1 via ORAL
  Filled 2023-02-26 (×2): qty 1

## 2023-02-26 MED ORDER — ENOXAPARIN SODIUM 40 MG/0.4ML IJ SOSY
40.0000 mg | PREFILLED_SYRINGE | Freq: Every day | INTRAMUSCULAR | Status: DC
  Administered 2023-02-28 – 2023-03-01 (×2): 40 mg via SUBCUTANEOUS
  Filled 2023-02-26 (×2): qty 0.4

## 2023-02-26 NOTE — Progress Notes (Addendum)
Patient arrived from ED to 5N unit @2355  via stretcher accompanied by NT. Assistance required for transfer, limited movement d/t pain on back, rated pain a 5/10 but denied pain meds, No belongings brought to unit. Alert and orientated x3 in a pleasant mood last BM according to pt was 2 days ago. Orders initiated - NPO at midnight. All questions answered upon arrival to unit, no acute distress noted.

## 2023-02-26 NOTE — Plan of Care (Signed)
  Problem: Education: Goal: Knowledge of General Education information will improve Description: Including pain rating scale, medication(s)/side effects and non-pharmacologic comfort measures Outcome: Progressing   Problem: Health Behavior/Discharge Planning: Goal: Ability to manage health-related needs will improve Outcome: Progressing   Problem: Clinical Measurements: Goal: Ability to maintain clinical measurements within normal limits will improve Outcome: Progressing Goal: Will remain free from infection Outcome: Progressing   Problem: Nutrition: Goal: Adequate nutrition will be maintained Outcome: Progressing   Problem: Coping: Goal: Level of anxiety will decrease Outcome: Progressing   Problem: Elimination: Goal: Will not experience complications related to urinary retention Outcome: Progressing

## 2023-02-26 NOTE — Consult Note (Signed)
Reason for Consult:lumbar fractures Referring Physician: [facility]  Frances Ortiz is an 89 y.o. female.   HPI:  89 year old female presented to the ED last night with severe back back pain but no leg pain. States that she has not been able to walk very well because of her back pain. She has been sleeping in a recliner. States that her insurance has denied her kyphoplasty in the past. Denies any change in her bowel or bladder habits. She takes vicodin for the pain.   Past Medical History:  Diagnosis Date   Anxiety    GERD (gastroesophageal reflux disease)    Heart murmur    HLD (hyperlipidemia)    Intestinal obstruction (HCC)    Osteoporosis    PONV (postoperative nausea and vomiting)    Skin cancer    melanoma, basal cell, and squamous    Past Surgical History:  Procedure Laterality Date   BREAST BIOPSY     bilateral   BREAST EXCISIONAL BIOPSY Right 30 years ago   fibroadenoma   BREAST EXCISIONAL BIOPSY Left 40 years ago   sclerosis thickening   CATARACT EXTRACTION W/ INTRAOCULAR LENS  IMPLANT, BILATERAL Bilateral    CESAREAN SECTION     x 2   CHOLECYSTECTOMY N/A 11/27/2012   Procedure: LAPAROSCOPIC CHOLECYSTECTOMY WITH INTRAOPERATIVE CHOLANGIOGRAM;  Surgeon: Robyne Askew, MD;  Location: [facility] OR;  Service: General;  Laterality: N/A;   COLECTOMY Right 10/07/2017   right   COLONOSCOPY  2005   COLONOSCOPY  08/2017   "found mass in cecum"   DILATION AND CURETTAGE OF UTERUS     ESOPHAGOGASTRODUODENOSCOPY     LAPAROSCOPIC RIGHT COLECTOMY Right 10/07/2017   Procedure: LAPAROSCOPIC RIGHT COLECTOMY ERAS PATHWAY;  Surgeon: Griselda Miner, MD;  Location: [facility] OR;  Service: General;  Laterality: Right;   SKIN CANCER EXCISION     TONSILLECTOMY      Allergies  Allergen Reactions   Aristocort [Triamcinolone] Other (See Comments)    Emotional instability   Cortisone Other (See Comments)    Emotional instability   Erythromycin Other (See Comments)    Stomach pain    Other  Other (See Comments)    Epinephrine in novocain makes heart race.   Hydrocodone-Acetaminophen Rash    Social History   Tobacco Use   Smoking status: Never   Smokeless tobacco: Never  Substance Use Topics   Alcohol use: No    Family History  Problem Relation Age of Onset   Pneumonia Mother    Colon cancer Neg Hx    Colon polyps Neg Hx    Rectal cancer Neg Hx    Stomach cancer Neg Hx      Review of Systems  Positive ROS: as above  All other systems have been reviewed and were otherwise negative with the exception of those mentioned in the HPI and as above.  Objective: Vital signs in last 24 hours: Temp:  [97.6 F (36.4 C)-98.4 F (36.9 C)] 98.4 F (36.9 C) (05/07 0715) Pulse Rate:  [82-99] 87 (05/07 0715) Resp:  [13-23] 20 (05/07 0715) BP: (124-156)/(66-83) 124/66 (05/07 0715) SpO2:  [88 %-97 %] 97 % (05/07 0715) Weight:  [61 kg] 61 kg (05/06 1534)  General Appearance: Alert, cooperative, no distress, appears stated age Head: Normocephalic, without obvious abnormality, atraumatic Eyes: PERRL, conjunctiva/corneas clear, EOM's intact, fundi benign, both eyes      Back: Symmetric, no curvature, ROM normal, no CVA tenderness Lungs: respirations unlabored Heart: Regular rate and rhythm  Pulses: 2+ and symmetric all extremities Skin: Skin color, texture, turgor normal, no rashes or lesions  NEUROLOGIC:   Mental status: A&O x4, no aphasia, good attention span, Memory and fund of knowledge Motor Exam - grossly normal, normal tone and bulk Sensory Exam - grossly normal Reflexes: symmetric, no pathologic reflexes, No Hoffman's, No clonus Coordination - grossly normal Gait - not tested Balance - not tested Cranial Nerves: I: smell Not tested  II: visual acuity  OS: na    OD: na  II: visual fields Full to confrontation  II: pupils Equal, round, reactive to light  III,VII: ptosis None  III,IV,VI: extraocular muscles  Full ROM  V: mastication   V: facial light touch  sensation    V,VII: corneal reflex    VII: facial muscle function - upper    VII: facial muscle function - lower   VIII: hearing   IX: soft palate elevation    IX,X: gag reflex   XI: trapezius strength    XI: sternocleidomastoid strength   XI: neck flexion strength    XII: tongue strength      Data Review Lab Results  Component Value Date   WBC 8.8 02/26/2023   HGB 14.1 02/26/2023   HCT 41.9 02/26/2023   MCV 96.1 02/26/2023   PLT 335 02/26/2023   Lab Results  Component Value Date   NA 137 02/26/2023   K 3.8 02/26/2023   CL 103 02/26/2023   CO2 25 02/26/2023   BUN 24 (H) 02/26/2023   CREATININE 0.49 02/26/2023   GLUCOSE 114 (H) 02/26/2023   Lab Results  Component Value Date   INR 1.1 02/26/2023     Assessment/Plan: 89 year old female with uncontrolled lower back pain. MRI shows acute superior endplate compression fractures at L2 and L3 with moderate spinal stenosis at L1-2 and severe spinal stenosis at L2-3. I do think she would be a candidate for a kyphoplasty. Recommend IR consult for this.    [name] [facility] 02/26/2023 8:54 AM

## 2023-02-26 NOTE — Progress Notes (Signed)
PROGRESS NOTE  Frances Ortiz  DOB: [birth year]  PCP: Clovis Riley, L.August Saucer, MD ZOX:[id number]  DOA: 02/25/2023  LOS: 1 day  Hospital Day: 2  Brief narrative: Frances Ortiz is a 89 y.o. female with PMH significant for cecal cancer s/p right colectomy, hyperlipidemia, GERD, anxiety who lives alone and at baseline, she was independent in her ADLs, cleaning and gardening. 5/6, patient presented to the ED from home with intractable back pain, progressively worsening since fall in February. In the first week of February, patient had a fall, seen by PCP who started her on physical therapy.  However pain continued to worsen.  PCP sent her to pain management where she received injections and nerve ablation despite which she did not have significant relief.  She was seen by neurosurgeon Dr. Wynetta Emery.  4/26, MRI L spine was done which showed L2/3 acute superior endplate compression deformities and moderate to severe spinal canal narrowing.  Discussion re kyphoplasty was initiated but apparently her insurance denied kyphoplasty.  She has been having severe pain significantly limiting her ADLs, no loss of bowel or bladder function.  She had been essentially bedbound for approximately 2 to 3 weeks despite taking Vicodin every 6 hours.  Ultimately presented to ED on 5/6.    In the ED, hemodynamically stable Labs mostly unremarkable Neurosurgeon recommended inpatient admission Admitted to [facility] Per neurosurgery recommendation, IR has been consulted for vertebroplasty.  Subjective: Patient was seen and examined this morning.  Pleasant elderly Caucasian female.  Lying down in bed. Not in distress.  Pain controlled.   IR consult obtained.  Plan for kyphoplasty tomorrow. Chart reviewed Remains hemodynamically stable.  Repeat labs this morning unremarkable except mild dehydration  Assessment and plan: Intractable back pain Acute superior endplate compression fractures at L2 and L3  Moderate spinal stenosis at L1-2   Severe spinal stenosis at L2-3. Onset after a fall in February 2024.   MRI 4/26 as above. Neurosurgery recommended IR consultation for kyphoplasty. For pain management patient is currently on PRN Tylenol, fentanyl and Dilaudid.  I will schedule Tylenol and oxycodone.  Stop fentanyl and continue IV Dilaudid Bowel regimen with scheduled Senokot, as needed MiraLAX.  Dehydration BUN/creatinine ratio seems to be elevated.  Currently on LR at 75 mill per hour.  Okay to continue for next 24 hours. Recent Labs    02/25/23 1920 02/26/23 0228  BUN 26* 24*  CREATININE 0.64 0.49    Anxiety Sertraline 50 mg daily  HLD Does not seem to be on meds  cecal cancer  s/p right colectomy  Impaired mobility Significant limitation in mobility due to back pain.  I believe she would benefit from purewick.   Mobility: PT eval postprocedure  Goals of care   Code Status: DNR     DVT prophylaxis:  enoxaparin (LOVENOX) injection 40 mg Start: 02/28/23 1000   Antimicrobials: None Fluid: NS at 75 mill per hour Consultants: IR Family Communication: None at bedside  Status: Inpatient Level of care:  Telemetry Medical   Patient from: Home Anticipated d/c to: Pending clinical course Needs to continue in-hospital care:  Pending kyphoplasty by IR tomorrow   Diet:  Diet Order             Diet NPO time specified  Diet effective midnight           Diet regular Room service appropriate? Yes; Fluid consistency: Thin  Diet effective now  Scheduled Meds:  acetaminophen  1,000 mg Oral TID   [START ON 02/28/2023] enoxaparin (LOVENOX) injection  40 mg Subcutaneous Daily   lidocaine  1 patch Transdermal Q24H   oxyCODONE  5 mg Oral Q6H   senna-docusate  1 tablet Oral QHS   sertraline  50 mg Oral Daily    PRN meds: HYDROmorphone (DILAUDID) injection, melatonin, naLOXone (NARCAN)  injection, ondansetron **OR** ondansetron (ZOFRAN) IV, polyethylene glycol   Infusions:    sodium chloride      Antimicrobials: Anti-infectives (From admission, onward)    None       Nutritional status:  Body mass index is 27.16 kg/m.          Objective: Vitals:   02/26/23 0441 02/26/23 0715  BP: 134/83 124/66  Pulse: 99 87  Resp: 15 20  Temp: 98.3 F (36.8 C) 98.4 F (36.9 C)  SpO2: 94% 97%    Intake/Output Summary (Last 24 hours) at 02/26/2023 1305 Last data filed at 02/26/2023 0900 Gross per 24 hour  Intake 18.16 ml  Output 0 ml  Net 18.16 ml   Filed Weights   02/25/23 1534  Weight: 61 kg   Weight change:  Body mass index is 27.16 kg/m.   Physical Exam: General exam: Pleasant, elderly Caucasian female.  Not in distress while at rest Skin: No rashes, lesions or ulcers.  looking dry HEENT: Atraumatic, normocephalic, no obvious bleeding Lungs: Clear to auscultation bilaterally CVS: Regular rate and rhythm, no murmur GI/Abd soft, nontender, nondistended, bowel sound present CNS: Alert, awake, oriented x 3 Psychiatry: Mood appropriate Extremities: No pedal edema, no calf tenderness  Data Review: I have personally reviewed the laboratory data and studies available.  F/u labs ordered Unresulted Labs (From admission, onward)     Start     Ordered   03/04/23 0500  Creatinine, serum  (enoxaparin (LOVENOX)    CrCl >/= 30 ml/min)  Weekly,   R     Comments: while on enoxaparin therapy    02/25/23 2328            Total time spent in review of labs and imaging, patient evaluation, formulation of plan, documentation and communication with family: 45 minutes  Signed, Lorin Glass, MD [facility] 02/26/2023

## 2023-02-26 NOTE — Consult Note (Signed)
Chief Complaint: Patient was seen in consultation today for L2-L3 compression fractures at the request of Dr. Pola Corn  Referring Physician(s): Dr. Pola Corn  Supervising Physician: Baldemar Lenis  Patient Status: [facility] - In-pt  History of Present Illness:  Frances Ortiz is a 89 y.o. female who presented to the ED c/o back pain. In February 2024, pt suffered a fall. She presented to her PCP who referred her to PT twice weekly. After PT sessions, she experienced pain that worsened over time. She was referred back to PCP from PT for management. She was referred to pain management and treated with injections and nerve ablation that was not effective. She saw Dr. Wynetta Emery, neurosurgeon, and underwent MRI lumbar spine that showed new acute superior endplate compression deformities at the L2 and L3 vertebral body levels with retropulsion and associated moderate spinal canal narrowing at L1-L2 and moderate to severe spinal canal narrowing at L2-L3.  Neurosurgery discussed kyphoplasty, but procedure was denied by insurance. Pt has been referred to IR for management of compression fractures. Imaging was reviewed and approved by Dr. Tommie Sams for L2-L3 kyphoplasty. Procedure tentatively scheduled for 02/27/23.   Pt denies fever, chills, CP, SOB, abd pain, N/V or HA.  She endorses severe back pain, loss of appetite, fatigue, dizziness and weakness.  Pain with medication 8/10. She understands that she will be NPO at [facility].   Past Medical History:  Diagnosis Date   Anxiety    GERD (gastroesophageal reflux disease)    Heart murmur    HLD (hyperlipidemia)    Intestinal obstruction (HCC)    Osteoporosis    PONV (postoperative nausea and vomiting)    Skin cancer    melanoma, basal cell, and squamous    Past Surgical History:  Procedure Laterality Date   BREAST BIOPSY     bilateral   BREAST EXCISIONAL BIOPSY Right 30 years ago   fibroadenoma   BREAST EXCISIONAL BIOPSY Left 40  years ago   sclerosis thickening   CATARACT EXTRACTION W/ INTRAOCULAR LENS  IMPLANT, BILATERAL Bilateral    CESAREAN SECTION     x 2   CHOLECYSTECTOMY N/A 11/27/2012   Procedure: LAPAROSCOPIC CHOLECYSTECTOMY WITH INTRAOPERATIVE CHOLANGIOGRAM;  Surgeon: Robyne Askew, MD;  Location: [facility] OR;  Service: General;  Laterality: N/A;   COLECTOMY Right 10/07/2017   right   COLONOSCOPY  2005   COLONOSCOPY  08/2017   "found mass in cecum"   DILATION AND CURETTAGE OF UTERUS     ESOPHAGOGASTRODUODENOSCOPY     LAPAROSCOPIC RIGHT COLECTOMY Right 10/07/2017   Procedure: LAPAROSCOPIC RIGHT COLECTOMY ERAS PATHWAY;  Surgeon: Griselda Miner, MD;  Location: [facility] OR;  Service: General;  Laterality: Right;   SKIN CANCER EXCISION     TONSILLECTOMY      Allergies: Aristocort [triamcinolone], Cortisone, Erythromycin, Other, and Hydrocodone-acetaminophen  Medications: Prior to Admission medications   Medication Sig Start Date End Date Taking? Authorizing Provider  HYDROcodone-acetaminophen (NORCO/VICODIN) 5-325 MG tablet Take 1 tablet by mouth every 6 (six) hours as needed for moderate pain.   Yes [provider]  sertraline (ZOLOFT) 50 MG tablet Take 50 mg by mouth daily.   Yes [provider]  Cholecalciferol (VITAMIN D3) 2000 UNITS capsule Take 2,000 Units by mouth daily.    [provider]  Multiple Vitamin (MULTIVITAMIN) tablet Take 1 tablet by mouth daily.     [provider]     Family History  Problem Relation Age of Onset   Pneumonia  Mother    Colon cancer Neg Hx    Colon polyps Neg Hx    Rectal cancer Neg Hx    Stomach cancer Neg Hx     Social History   Socioeconomic History   Marital status: Divorced    Spouse name: Not on file   Number of children: 2   Years of education: Not on file   Highest education level: Not on file  Occupational History   Occupation: retired  Tobacco Use   Smoking status: Never   Smokeless tobacco: Never  Vaping Use    Vaping Use: Never used  Substance and Sexual Activity   Alcohol use: No   Drug use: No   Sexual activity: Not on file  Other Topics Concern   Not on file  Social History Narrative   Not on file   Social Determinants of Health   Financial Resource Strain: Not on file  Food Insecurity: Not on file  Transportation Needs: Not on file  Physical Activity: Not on file  Stress: Not on file  Social Connections: Not on file    Review of Systems: A 12 point ROS discussed and pertinent positives are indicated in the HPI above.  All other systems are negative.  Review of Systems  Constitutional:  Positive for appetite change and fatigue. Negative for chills and fever.  Respiratory:  Negative for cough and shortness of breath.   Cardiovascular:  Negative for chest pain.  Gastrointestinal:  Negative for abdominal pain, nausea and vomiting.  Musculoskeletal:  Positive for back pain.  Neurological:  Positive for dizziness and weakness. Negative for headaches.    Vital Signs: BP 124/66 (BP Location: Right Arm)   Pulse 87   Temp 98.4 F (36.9 C) (Oral)   Resp 20   Ht 4\' 11"  (1.499 m)   Wt 134 lb 7.7 oz (61 kg)   SpO2 97%   BMI 27.16 kg/m    Physical Exam Vitals reviewed.  Constitutional:      General: She is not in acute distress.    Appearance: She is ill-appearing.  HENT:     Head: Normocephalic and atraumatic.     Mouth/Throat:     Mouth: Mucous membranes are dry.     Pharynx: Oropharynx is clear.  Eyes:     Extraocular Movements: Extraocular movements intact.     Pupils: Pupils are equal, round, and reactive to light.  Cardiovascular:     Rate and Rhythm: Normal rate and regular rhythm.     Pulses: Normal pulses.     Heart sounds: Normal heart sounds.  Pulmonary:     Effort: Pulmonary effort is normal. No respiratory distress.     Breath sounds: Normal breath sounds.  Abdominal:     General: Bowel sounds are normal. There is no distension.     Palpations: Abdomen is  soft.     Tenderness: There is no abdominal tenderness. There is no guarding.  Musculoskeletal:     Right lower leg: No edema.     Left lower leg: No edema.  Skin:    General: Skin is warm and dry.  Neurological:     Mental Status: She is alert and oriented to person, place, and time.  Psychiatric:        Mood and Affect: Mood normal.        Behavior: Behavior normal.        Thought Content: Thought content normal.        Judgment: Judgment normal.  Imaging: MR LUMBAR SPINE WO CONTRAST  Addendum Date: 02/18/2023   ADDENDUM REPORT: 02/18/2023 09:25 ADDENDUM: Per the order form in Epic, the provided history states compression fracture of the L3 vertebra, initial encounter Electronically Signed   By: Lorenza Cambridge M.D.   On: 02/18/2023 09:25   Result Date: 02/18/2023 CLINICAL DATA:  No history available EXAM: MRI LUMBAR SPINE WITHOUT CONTRAST TECHNIQUE: Multiplanar, multisequence MR imaging of the lumbar spine was performed. No intravenous contrast was administered. COMPARISON:  MRI L Spine 09/03/22 FINDINGS: Segmentation: Standard. There are small rib lets at T12. The last well-formed disc space is labeled L5-S1. Alignment: There is grade 1 anterolisthesis of L3-L4 and L4 on L5. there is trace retrolisthesis of T12 on L1. Vertebrae: Compared to prior lumbar spine MRI dated 09/03/2022, there are new compression deformities at the L2 and L3 vertebral body levels and a chronic compression deformity of the L4 vertebral body level. There is edema like signal within the L2 and L3 vertebral bodies, suggestive of vertebral body edema, which can be seen in the setting of an acute process. T2/FLAIR hyperintense signal is also present within the L1-L2 disc space, new from prior exam. This may be degenerative or reactive in nature. There is heterogeneous marrow signal with unchanged T1 hypointense lesions at the L1 vertebral body. Conus medullaris and cauda equina: Conus extends to the L1 level. Conus and  cauda equina appear normal. Paraspinal and other soft tissues: There is edema like signal in the right psoas muscle with a central rounded region of T2 hypointense signal abnormality (series 12, image 20). While this could represent a hematoma in the setting of vertebral body compression deformities, a psoas abscess is not excluded. Small amount of T2/stir hyperintense signal abnormality is also seen in the prevertebral soft tissues at these levels (series 10, image 10). Redemonstrated is a 1.2 x 1.5 cm T2 hypointense lesion in the right adrenal gland. This was previously assessed CT abdomen and pelvis dated 10/02/2017 and was indeterminate at that. Disc levels: T12-L1: Mild bilateral facet degenerative change. Circumferential disc bulge. No spinal canal narrowing. No neural foraminal narrowing. L1-L2: There is posterior bulging of the superior endplate cortex at the L2 level. This results in moderate spinal canal narrowing, new from 2023. There is severe right and moderate left neural foraminal narrowing at this level L2-L3: There is posterior bulging of the superior endplate cortex of the L3 level. There is new moderate to severe spinal canal narrowing. There is severe bilateral neural foraminal narrowing, new on the right and progressed on the left. L3-L4: Moderate bilateral facet degenerative change. Circumferential disc bulge. Moderate spinal canal narrowing. Short pedicles. Moderate bilateral neural foraminal narrowing. Findings are unchanged from prior exam. L4-L5: Circumferential disc bulge. Severe bilateral facet degenerative change. Ligamentum flavum hypertrophy. Short pedicles. Severe spinal canal narrowing. Moderate bilateral neural foraminal narrowing. Findings are unchanged from prior exam L5-S1: Severe bilateral facet degenerative change. Circumferential disc bulge. No spinal canal. Moderate left and moderate to severe right neural foraminal narrowing. Findings are unchanged from prior exam.  IMPRESSION: 1. New acute superior endplate compression deformities at the L2 and L3 vertebral body levels with retropulsion and associated moderate spinal canal narrowing at L1-L2 and moderate to severe spinal canal narrowing at L2-L3. 2. Edema like signal in the right psoas muscle with a possible right psoas abscess or hematama. Small amount of T2/STIR hyperintense signal abnormality is also seen in the prevertebral soft tissues at these levels. Recommend further evaluation with contrast-enhanced lumbar spine  MRI. 3. Additional degenerative changes as above and not significantly changed from 2023. These results will be called to the ordering clinician or representative by the Radiologist Assistant, and communication documented in the PACS or [employer]. Electronically Signed: By: Lorenza Cambridge M.D. On: 02/18/2023 08:28    Labs:  CBC: Recent Labs    02/25/23 1920 02/26/23 0228  WBC 9.9 8.8  HGB 14.0 14.1  HCT 41.0 41.9  PLT 338 335    COAGS: Recent Labs    02/25/23 1920 02/26/23 0228  INR 1.1 1.1    BMP: Recent Labs    02/25/23 1920 02/26/23 0228  NA 138 137  K 4.0 3.8  CL 103 103  CO2 24 25  GLUCOSE 113* 114*  BUN 26* 24*  CALCIUM 9.3 9.2  CREATININE 0.64 0.49  GFRNONAA >60 >60    LIVER FUNCTION TESTS: No results for input(s): "BILITOT", "AST", "ALT", "ALKPHOS", "PROT", "ALBUMIN" in the last 8760 hours.  TUMOR MARKERS: Recent Labs    04/18/22 1111  CEA 1.41    Assessment and Plan:  89 yo female with PMHx significant for cecal cancer s/p right colectomy, anxiety, GERD, heart murmur, HLD, osteoporosis presents to IR for kyphoplasty for L2-L3 compression fracture.   Pt resting in bed with family member at bedside.  She is A&O, calm and pleasant.  She is in no distress.   Risks and benefits of L2-L3 kyphoplasty with moderate sedation were discussed with the patient including, but not limited to education regarding the natural healing process of compression  fractures without intervention, bleeding, infection, cement migration which may cause spinal cord damage, paralysis, pulmonary embolism or even death.  This interventional procedure involves the use of X-rays and because of the nature of the planned procedure, it is possible that we will have prolonged use of X-ray fluoroscopy.  Potential radiation risks to you include (but are not limited to) the following: - A slightly elevated risk for cancer  several years later in life. This risk is typically less than 0.5% percent. This risk is low in comparison to the normal incidence of human cancer, which is 33% for women and 50% for men according to the [employer] Society. - Radiation induced injury can include skin redness, resembling a rash, tissue breakdown / ulcers and hair loss (which can be temporary or permanent).   The likelihood of either of these occurring depends on the difficulty of the procedure and whether you are sensitive to radiation due to previous procedures, disease, or genetic conditions.   IF your procedure requires a prolonged use of radiation, you will be notified and given written instructions for further action.  It is your responsibility to monitor the irradiated area for the 2 weeks following the procedure and to notify your physician if you are concerned that you have suffered a radiation induced injury.    All of the patient's questions were answered, patient is agreeable to proceed.  Consent signed and in chart.   Thank you for this interesting consult.  I greatly enjoyed meeting GENESE HIBBETT and look forward to participating in their care.  A copy of this report was sent to the requesting provider on this date.  Electronically Signed: Shon Hough, NP 02/26/2023, 11:47 AM   I spent a total of 20 minutes in face to face in clinical consultation, greater than 50% of which was counseling/coordinating care for L2-L3 compression fracture.

## 2023-02-27 ENCOUNTER — Inpatient Hospital Stay (HOSPITAL_COMMUNITY): Payer: Medicare Other

## 2023-02-27 DIAGNOSIS — R52 Pain, unspecified: Secondary | ICD-10-CM | POA: Diagnosis not present

## 2023-02-27 HISTORY — PX: IR KYPHO EA ADDL LEVEL THORACIC OR LUMBAR: IMG5520

## 2023-02-27 HISTORY — PX: IR KYPHO LUMBAR INC FX REDUCE BONE BX UNI/BIL CANNULATION INC/IMAGING: IMG5519

## 2023-02-27 MED ORDER — IOHEXOL 300 MG/ML  SOLN: 50.0000 mL | Freq: Once | INTRAMUSCULAR | Status: DC | PRN

## 2023-02-27 MED ORDER — BUPIVACAINE HCL (PF) 0.25 % IJ SOLN
30.0000 mL | Freq: Once | INTRAMUSCULAR | Status: AC
  Administered 2023-02-27: 30 mL
  Filled 2023-02-27: qty 30

## 2023-02-27 MED ORDER — HYDRALAZINE HCL 20 MG/ML IJ SOLN: 10.0000 mg | Freq: Once | INTRAMUSCULAR | Status: DC | PRN

## 2023-02-27 MED ORDER — HYDRALAZINE HCL 20 MG/ML IJ SOLN: 10.0000 mg | Freq: Four times a day (QID) | INTRAMUSCULAR | Status: DC | PRN

## 2023-02-27 MED ORDER — LABETALOL HCL 5 MG/ML IV SOLN
INTRAVENOUS | Status: AC
  Filled 2023-02-27: qty 4

## 2023-02-27 MED ORDER — LIDOCAINE HCL (PF) 1 % IJ SOLN
INTRAMUSCULAR | Status: AC
  Filled 2023-02-27: qty 30

## 2023-02-27 MED ORDER — BUPIVACAINE HCL 0.25 % IJ SOLN
30.0000 mL | Freq: Once | INTRAMUSCULAR | Status: DC
  Filled 2023-02-27: qty 30

## 2023-02-27 MED ORDER — HYDROMORPHONE HCL 1 MG/ML IJ SOLN
INTRAMUSCULAR | Status: AC
  Filled 2023-02-27: qty 1

## 2023-02-27 MED ORDER — CEFAZOLIN SODIUM-DEXTROSE 2-4 GM/100ML-% IV SOLN
2.0000 g | INTRAVENOUS | Status: AC
  Administered 2023-02-27: 2 g via INTRAVENOUS

## 2023-02-27 MED ORDER — MIDAZOLAM HCL 2 MG/2ML IJ SOLN
INTRAMUSCULAR | Status: AC
  Filled 2023-02-27: qty 2

## 2023-02-27 MED ORDER — HYDROMORPHONE HCL 1 MG/ML IJ SOLN
INTRAMUSCULAR | Status: AC | PRN
  Administered 2023-02-27 (×2): .25 mg via INTRAVENOUS
  Administered 2023-02-27: .5 mg via INTRAVENOUS

## 2023-02-27 MED ORDER — BUPIVACAINE HCL (PF) 0.25 % IJ SOLN
INTRAMUSCULAR | Status: AC
  Filled 2023-02-27: qty 30

## 2023-02-27 MED ORDER — CEFAZOLIN SODIUM-DEXTROSE 2-4 GM/100ML-% IV SOLN
INTRAVENOUS | Status: AC
  Filled 2023-02-27: qty 100

## 2023-02-27 MED ORDER — LIDOCAINE HCL (PF) 1 % IJ SOLN
30.0000 mL | Freq: Once | INTRAMUSCULAR | Status: AC
  Administered 2023-02-27: 30 mL
  Filled 2023-02-27: qty 30

## 2023-02-27 MED ORDER — LABETALOL HCL 5 MG/ML IV SOLN
INTRAVENOUS | Status: AC | PRN
  Administered 2023-02-27 (×2): 2.5 mg via INTRAVENOUS

## 2023-02-27 MED ORDER — MIDAZOLAM HCL 2 MG/2ML IJ SOLN
INTRAMUSCULAR | Status: AC | PRN
  Administered 2023-02-27 (×4): .5 mg via INTRAVENOUS

## 2023-02-27 NOTE — Progress Notes (Signed)
MEDICATION-RELATED CONSULT NOTE   IR Procedure Consult - Anticoagulant/Antiplatelet PTA/Inpatient Med List Review by Pharmacist   Procedure: L2-L3 kyphoplasty    Completed: 02/27/23  Post-Procedural bleeding risk per IR MD assessment:  standard  Antithrombotic medications on inpatient or PTA profile prior to procedure:   enoxaparin 40mc Bud q24h for VTE ppx   Recommended restart time per IR Post-Procedure Guidelines:  Next AM  Plan:     Resume Lovenox 40mg  Neibert q24h on 5/9  Rexford Maus, PharmD, BCPS 02/27/2023 10:50 AM

## 2023-02-27 NOTE — Progress Notes (Signed)
PROGRESS NOTE  Frances Ortiz  DOB: [birth year]  PCP: Irven Coe, MD RUE:[id number]  DOA: 02/25/2023  LOS: 2 days  Hospital Day: 3  Brief narrative: Frances Ortiz is a 89 y.o. female with PMH significant for cecal cancer s/p right colectomy, hyperlipidemia, GERD, anxiety who lives alone and at baseline, she was independent in her ADLs, cleaning and gardening. 5/6, patient presented to the ED from home with intractable back pain, progressively worsening since fall in February. In the first week of February, patient had a fall, seen by PCP who started her on physical therapy.  However pain continued to worsen.  PCP sent her to pain management where she received injections and nerve ablation despite which she did not have significant relief.  She was seen by neurosurgeon Dr. Wynetta Emery.  4/26, MRI L spine was done which showed L2/3 acute superior endplate compression deformities and moderate to severe spinal canal narrowing.  Discussion re kyphoplasty was initiated but apparently her insurance denied kyphoplasty.  She has been having severe pain significantly limiting her ADLs, no loss of bowel or bladder function.  She had been essentially bedbound for approximately 2 to 3 weeks despite taking Vicodin every 6 hours.  Ultimately presented to ED on 5/6.    In the ED, hemodynamically stable Labs mostly unremarkable Neurosurgeon recommended inpatient admission Admitted to [facility] Per neurosurgery recommendation, IR has been consulted for vertebroplasty.  Subjective: Patient was seen and examined this morning.   Has just returned back from kyphoplasty.  Looks drowsy.   No family at bedside.  Assessment and plan: Intractable back pain Acute superior endplate compression fractures at L2 and L3  Moderate spinal stenosis at L1-2  Severe spinal stenosis at L2-3. Onset after a fall in February 2024.   MRI 4/26 as above. 5/8, underwent kyphoplasty by IR. For pain management patient is currently on  scheduled Tylenol and oxycodone and as needed IV Dilaudid Bowel regimen with scheduled Senokot, as needed MiraLAX. PT eval ordered.  Hypertension Blood pressure significantly elevated in the last 24 hours.  No prior history of hypertension. Elevated blood pressure is partly due to pain.  IV hydralazine ordered.  Continue to monitor  Dehydration BUN/creatinine ratio seems to be elevated.  Currently on LR at 75 mill per hour.  With elevated blood pressure, I would stop IV hydration.  Encourage oral hydration. Recent Labs    02/25/23 1920 02/26/23 0228  BUN 26* 24*  CREATININE 0.64 0.49    Anxiety Sertraline 50 mg daily  HLD Does not seem to be on meds  cecal cancer  s/p right colectomy  Impaired mobility Significant limitation in mobility due to back pain.  I believe she would benefit from purewick.   Mobility: PT eval postprocedure  Goals of care   Code Status: DNR     DVT prophylaxis:  enoxaparin (LOVENOX) injection 40 mg Start: 02/28/23 1000   Antimicrobials: None Fluid: Can stop IV fluid Consultants: IR Family Communication: None at bedside  Status: Inpatient Level of care:  Telemetry Medical   Patient from: Home Anticipated d/c to: Pending clinical course Needs to continue in-hospital care:  Status post kyphoplasty by IR today.  Pending PT eval   Diet:  Diet Order             Diet NPO time specified  Diet effective midnight                   Scheduled Meds:  acetaminophen  1,000 mg Oral TID   [  START ON 02/28/2023] enoxaparin (LOVENOX) injection  40 mg Subcutaneous Daily   lidocaine  1 patch Transdermal Q24H   oxyCODONE  5 mg Oral Q6H   senna-docusate  1 tablet Oral QHS   sertraline  50 mg Oral Daily    PRN meds: hydrALAZINE, HYDROmorphone (DILAUDID) injection, iohexol, iohexol, melatonin, naLOXone (NARCAN)  injection, ondansetron **OR** ondansetron (ZOFRAN) IV, polyethylene glycol   Infusions:     Antimicrobials: Anti-infectives  (From admission, onward)    Start     Dose/Rate Route Frequency Ordered Stop   02/27/23 0830  ceFAZolin (ANCEF) IVPB 2g/100 mL premix        2 g 200 mL/hr over 30 Minutes Intravenous To Short Stay 02/27/23 0820 02/27/23 0926       Nutritional status:  Body mass index is 27.16 kg/m.          Objective: Vitals:   02/27/23 1100 02/27/23 1130  BP: (!) 190/88 (!) 161/73  Pulse: 84 84  Resp: 18 18  Temp:  98.1 F (36.7 C)  SpO2: 97% 92%    Intake/Output Summary (Last 24 hours) at 02/27/2023 1158 Last data filed at 02/27/2023 1610 Gross per 24 hour  Intake 139.25 ml  Output 500 ml  Net -360.75 ml    Filed Weights   02/25/23 1534  Weight: 61 kg   Weight change:  Body mass index is 27.16 kg/m.   Physical Exam: General exam: Pleasant, elderly Caucasian female.  Drowsy Skin: No rashes, lesions or ulcers.  looking dry HEENT: Atraumatic, normocephalic, no obvious bleeding Lungs: Clear to auscultation bilaterally CVS: Regular rate and rhythm, no murmur GI/Abd soft, nontender, nondistended, bowel sound present CNS: Drowsy from the effect of pain meds. Psychiatry: Mood appropriate Extremities: No pedal edema, no calf tenderness  Data Review: I have personally reviewed the laboratory data and studies available.  F/u labs ordered Unresulted Labs (From admission, onward)     Start     Ordered   03/04/23 0500  Creatinine, serum  (enoxaparin (LOVENOX)    CrCl >/= 30 ml/min)  Weekly,   R     Comments: while on enoxaparin therapy    02/25/23 2328   02/28/23 0500  Basic metabolic panel  Tomorrow morning,   R       Question:  Specimen collection method  Answer:  Lab=Lab collect   02/27/23 0834   02/28/23 0500  CBC with Differential/Platelet  Tomorrow morning,   R       Question:  Specimen collection method  Answer:  Lab=Lab collect   02/27/23 0834            Total time spent in review of labs and imaging, patient evaluation, formulation of plan, documentation and  communication with family: 45 minutes  Signed, Lorin Glass, MD [facility] 02/27/2023

## 2023-02-27 NOTE — Sedation Documentation (Signed)
Pt BP noted to be elevated as reflected by documentation. Dr. Tommie Sams notified and acknowledges.  She advised to administer 10 mg of Hydralazine on floor should BP not come back down.  Receiving RN notified directly during report and advised that it would be entered as a verbal PRN one time order.  Pt appears to be in NAD at this time and does not endorse any complaints or concerns.  Pt able to follow commands and no neurological deficits noted at this time. Dr. Tommie Sams present at bedside with patient at this time.

## 2023-02-28 DIAGNOSIS — R52 Pain, unspecified: Secondary | ICD-10-CM | POA: Diagnosis not present

## 2023-02-28 LAB — CBC WITH DIFFERENTIAL/PLATELET
Abs Immature Granulocytes: 0.05 10*3/uL (ref 0.00–0.07)
Basophils Absolute: 0 10*3/uL (ref 0.0–0.1)
Basophils Relative: 1 %
Eosinophils Absolute: 0.3 10*3/uL (ref 0.0–0.5)
Eosinophils Relative: 4 %
HCT: 38.7 % (ref 36.0–46.0)
Hemoglobin: 13.1 g/dL (ref 12.0–15.0)
Immature Granulocytes: 1 %
Lymphocytes Relative: 17 %
Lymphs Abs: 1.4 10*3/uL (ref 0.7–4.0)
MCH: 32.5 pg (ref 26.0–34.0)
MCHC: 33.9 g/dL (ref 30.0–36.0)
MCV: 96 fL (ref 80.0–100.0)
Monocytes Absolute: 0.6 10*3/uL (ref 0.1–1.0)
Monocytes Relative: 7 %
Neutro Abs: 6 10*3/uL (ref 1.7–7.7)
Neutrophils Relative %: 70 %
Platelets: 293 10*3/uL (ref 150–400)
RBC: 4.03 MIL/uL (ref 3.87–5.11)
RDW: 11.6 % (ref 11.5–15.5)
WBC: 8.5 10*3/uL (ref 4.0–10.5)
nRBC: 0 % (ref 0.0–0.2)

## 2023-02-28 LAB — BASIC METABOLIC PANEL
Anion gap: 10 (ref 5–15)
BUN: 17 mg/dL (ref 8–23)
CO2: 27 mmol/L (ref 22–32)
Calcium: 8.9 mg/dL (ref 8.9–10.3)
Chloride: 96 mmol/L — ABNORMAL LOW (ref 98–111)
Creatinine, Ser: 0.57 mg/dL (ref 0.44–1.00)
GFR, Estimated: >60 mL/min (ref >60)
Glucose, Bld: 92 mg/dL (ref 70–99)
Potassium: 3.7 mmol/L (ref 3.5–5.1)
Sodium: 133 mmol/L — ABNORMAL LOW (ref 135–145)

## 2023-02-28 LAB — SURGICAL PATHOLOGY

## 2023-02-28 MED ORDER — SENNOSIDES-DOCUSATE SODIUM 8.6-50 MG PO TABS
1.0000 | ORAL_TABLET | Freq: Two times a day (BID) | ORAL | Status: DC
  Administered 2023-02-28 – 2023-03-01 (×3): 1 via ORAL
  Filled 2023-02-28 (×3): qty 1

## 2023-02-28 MED ORDER — POLYETHYLENE GLYCOL 3350 17 G PO PACK
17.0000 g | PACK | Freq: Every day | ORAL | Status: DC
  Administered 2023-02-28 – 2023-03-01 (×2): 17 g via ORAL
  Filled 2023-02-28 (×2): qty 1

## 2023-02-28 NOTE — Progress Notes (Signed)
Physical Therapy Treatment Patient Details Name: Frances Ortiz MRN: [id number] DOB: 14-Apr-1931 Today's Date: 02/28/2023   History of Present Illness Patient is a 89 year old female with uncontrolled lower back pain. Found to have acute superior endplate compression fractures at L2-L3 with moderate stenosis L1-L2 and severe stenosis L2-3. S/p kyphoplasty    PT Comments    Patient's son-in-law called the front rehab office to request that an update be provided to the daughter. Returned to patient's room to request permission to call her daughter. She requested for PT to assist with returning to bed due to feeling uncomfortable in the chair. Min A required for stand step transfer and Min A required for logroll technique to return to bed. Activity tolerance limited by fatigue and no reported increased pain with mobility efforts. Updated provided to supportive daughter, Frances Ortiz, regarding physical therapy progress and discharge goals. Patient and family are hopeful for discharge to rehab. PT will continue to follow while in the hospital to maximize independence and decrease caregiver burden.    Recommendations for follow up therapy are one component of a multi-disciplinary discharge planning process, led by the attending physician.  Recommendations may be updated based on patient status, additional functional criteria and insurance authorization.  Follow Up Recommendations  Can patient physically be transported by private vehicle: Yes    Assistance Recommended at Discharge Intermittent Supervision/Assistance  Patient can return home with the following A little help with walking and/or transfers;A little help with bathing/dressing/bathroom;Help with stairs or ramp for entrance;Assist for transportation;Assistance with cooking/housework   Equipment Recommendations   (to be determined at next level of care)    Recommendations for Other Services       Precautions / Restrictions  Precautions Precautions: Fall;Back Precaution Booklet Issued: No Precaution Comments: general back precuations following kyphoplasty Restrictions Weight Bearing Restrictions: No     Mobility  Bed Mobility Overal bed mobility: Needs Assistance Bed Mobility: Rolling, Sit to Sidelying Rolling: Min assist Sidelying to sit: Min assist     Sit to sidelying: Min assist General bed mobility comments: reinforced logroll technique for bed mobility. increased time required for all activity with no reported increased pain. patient does complain of dizziness initially with laying flat that subsides within a few seconds    Transfers Overall transfer level: Needs assistance Equipment used: Rolling walker (2 wheels) Transfers: Sit to/from Stand Sit to Stand: Min assist   Step pivot transfers: Min assist       General transfer comment: verbal cues for hand placement and technique using rolling walker to take several steps from chair to bed    Ambulation/Gait Ambulation/Gait assistance: Min assist Gait Distance (Feet): 15 Feet (x 2 bouts) Assistive device: Rolling walker (2 wheels) Gait Pattern/deviations: Step-to pattern, Decreased stride length, Trunk flexed, Narrow base of support Gait velocity: decreased     General Gait Details: cues to stand closer to rolling walker for support and to avoid trunk flexion. steadying assistance provided as well as occasional physical assistance for rolling walker negotiation with turns   Stairs             Wheelchair Mobility    Modified Rankin (Stroke Patients Only)       Balance Overall balance assessment: Needs assistance Sitting-balance support: Feet supported Sitting balance-Leahy Scale: Fair     Standing balance support: Bilateral upper extremity supported, No upper extremity supported Standing balance-Leahy Scale: Fair Standing balance comment: using rolling walker for support in standing  Cognition Arousal/Alertness: Awake/alert Behavior During Therapy: WFL for tasks assessed/performed Overall Cognitive Status: Within Functional Limits for tasks assessed                                          Exercises      General Comments General comments (skin integrity, edema, etc.): Patient's son-in-law called the acute rehab department and left a message to call the daughter Frances Ortiz. Patient gave verbal permission to call and update was provided by me to Ms Frances Ortiz over the phone regarding physical therapy evaluation and discharge recommendations from a therapy perspective      Pertinent Vitals/Pain Pain Assessment Pain Assessment: Faces Faces Pain Scale: Hurts a little bit Pain Location: low back Pain Descriptors / Indicators: Sore Pain Intervention(s): Limited activity within patient's tolerance, Monitored during session, Repositioned    Home Living Family/patient expects to be discharged to:: Private residence Living Arrangements: Alone Available Help at Discharge: Family;Available PRN/intermittently Type of Home: House Home Access: Stairs to enter Entrance Stairs-Rails: None Entrance Stairs-Number of Steps: 2   Home Layout: One level Home Equipment: Shower seat;Cane - single point;Rollator (4 wheels) Additional Comments: has a caregiver at home recently to assist as needed due to limitiations from back pain.    Prior Function            PT Goals (current goals can now be found in the care plan section) Acute Rehab PT Goals Patient Stated Goal: to go to rehab PT Goal Formulation: With patient Time For Goal Achievement: 03/14/23 Potential to Achieve Goals: Fair Progress towards PT goals: Progressing toward goals    Frequency    Min 3X/week      PT Plan Current plan remains appropriate    Co-evaluation              AM-PAC PT "6 Clicks" Mobility   Outcome Measure  Help needed turning from your back to your side  while in a flat bed without using bedrails?: A Little Help needed moving from lying on your back to sitting on the side of a flat bed without using bedrails?: A Little Help needed moving to and from a bed to a chair (including a wheelchair)?: A Little Help needed standing up from a chair using your arms (e.g., wheelchair or bedside chair)?: A Little Help needed to walk in hospital room?: A Little Help needed climbing 3-5 steps with a railing? : A Lot 6 Click Score: 17    End of Session Equipment Utilized During Treatment: Gait belt Activity Tolerance: Patient tolerated treatment well Patient left: in bed;with call bell/phone within reach;with bed alarm set Nurse Communication: Mobility status PT Visit Diagnosis: Other abnormalities of gait and mobility (R26.89);Difficulty in walking, not elsewhere classified (R26.2)     Time: 4098-1191 PT Time Calculation (min) (ACUTE ONLY): 17 min  Charges:  $Therapeutic Activity: 8-22 mins                     Donna Bernard, PT, MPT    [name] 02/28/2023, 11:53 AM

## 2023-02-28 NOTE — Progress Notes (Signed)
PROGRESS NOTE  Frances Ortiz  DOB: Jan 12, 1931  PCP: Irven Coe, MD ZOX:[id number]  DOA: 02/25/2023  LOS: 3 days  Hospital Day: 4  Brief narrative: Frances Ortiz is a 89 y.o. female with PMH significant for cecal cancer s/p right colectomy, hyperlipidemia, GERD, anxiety who lives alone and at baseline, she was independent in her ADLs, cleaning and gardening. 5/6, patient presented to the ED from home with intractable back pain, progressively worsening since fall in February. In the first week of February, patient had a fall, seen by PCP who started her on physical therapy.  However pain continued to worsen.  PCP sent her to pain management where she received injections and nerve ablation despite which she did not have significant relief.  She was seen by neurosurgeon Dr. Wynetta Emery.  4/26, MRI L spine was done which showed L2/3 acute superior endplate compression deformities and moderate to severe spinal canal narrowing.  Discussion re kyphoplasty was initiated but apparently her insurance denied kyphoplasty.  She has been having severe pain significantly limiting her ADLs, no loss of bowel or bladder function.  She had been essentially bedbound for approximately 2 to 3 weeks despite taking Vicodin every 6 hours.  Ultimately presented to ED on 5/6.    In the ED, hemodynamically stable Labs mostly unremarkable Neurosurgeon recommended inpatient admission Admitted to [facility] Per neurosurgery recommendation, IR has been consulted for vertebroplasty.  Subjective: Patient was seen and examined this morning.   Feels better after kyphoplasty yesterday.  Had an episode of vomiting this morning.  No bowel movement in few days now. Wants to go to [facility] SNF.  Insurance authorization in process.  Assessment and plan: Intractable back pain Acute superior endplate compression fractures at L2 and L3  Moderate spinal stenosis at L1-2  Severe spinal stenosis at L2-3. Onset after a fall in February 2024.    MRI 4/26 as above. 5/8, underwent kyphoplasty by IR. For pain management patient is currently on scheduled Tylenol and oxycodone and as needed IV Dilaudid Bowel regimen with scheduled Senokot, as needed MiraLAX.  Switched to scheduled MiraLAX today. PT eval obtained.  SNF recommended.  Hypertension Blood pressure better today compared to yesterday.  Not on meds.  Continue IV hydralazine ordered.  Continue to monitor  Dehydration BUN/creatinine ratio was elevated on admission.  Adequately hydrated   Anxiety Sertraline 50 mg daily  HLD Does not seem to be on meds  cecal cancer  s/p right colectomy   Goals of care   Code Status: DNR     DVT prophylaxis:  enoxaparin (LOVENOX) injection 40 mg Start: 02/28/23 1000   Antimicrobials: None Fluid: Not on IV fluid Consultants: IR Family Communication: None at bedside  Status: Inpatient Level of care:  Telemetry Medical   Patient from: Home Anticipated d/c to: Pending clinical course Needs to continue in-hospital care:  Status post kyphoplasty by IR today.  Pending SNF.  Medically stable for discharge   Diet:  Diet Order             Diet regular Room service appropriate? Yes; Fluid consistency: Thin  Diet effective now                   Scheduled Meds:  acetaminophen  1,000 mg Oral TID   enoxaparin (LOVENOX) injection  40 mg Subcutaneous Daily   lidocaine  1 patch Transdermal Q24H   oxyCODONE  5 mg Oral Q6H   polyethylene glycol  17 g Oral Daily   senna-docusate  1 tablet Oral BID   sertraline  50 mg Oral Daily    PRN meds: hydrALAZINE, HYDROmorphone (DILAUDID) injection, iohexol, iohexol, melatonin, naLOXone (NARCAN)  injection, ondansetron **OR** ondansetron (ZOFRAN) IV   Infusions:     Antimicrobials: Anti-infectives (From admission, onward)    Start     Dose/Rate Route Frequency Ordered Stop   02/27/23 0830  ceFAZolin (ANCEF) IVPB 2g/100 mL premix        2 g 200 mL/hr over 30 Minutes  Intravenous To Short Stay 02/27/23 0820 02/27/23 0926       Nutritional status:  Body mass index is 27.16 kg/m.          Objective: Vitals:   02/27/23 2200 02/28/23 0729  BP: 126/67 (!) 154/74  Pulse: 84 80  Resp: 16 16  Temp: 98.2 F (36.8 C) 98.9 F (37.2 C)  SpO2: 97% 95%    Intake/Output Summary (Last 24 hours) at 02/28/2023 1443 Last data filed at 02/28/2023 0600 Gross per 24 hour  Intake --  Output 800 ml  Net -800 ml    Filed Weights   02/25/23 1534  Weight: 61 kg   Weight change:  Body mass index is 27.16 kg/m.   Physical Exam: General exam: Pleasant, elderly Caucasian female.  Pain improving. Skin: No rashes, lesions or ulcers.  looking dry HEENT: Atraumatic, normocephalic, no obvious bleeding Lungs: Clear to auscultation bilaterally CVS: Regular rate and rhythm, no murmur GI/Abd soft, nontender, nondistended, bowel sound present CNS: Alert, awake, oriented x 3 Psychiatry: Mood appropriate Extremities: No pedal edema, no calf tenderness  Data Review: I have personally reviewed the laboratory data and studies available.  F/u labs ordered Unresulted Labs (From admission, onward)     Start     Ordered   03/04/23 0500  Creatinine, serum  (enoxaparin (LOVENOX)    CrCl >/= 30 ml/min)  Weekly,   R     Comments: while on enoxaparin therapy    02/25/23 2328            Total time spent in review of labs and imaging, patient evaluation, formulation of plan, documentation and communication with family: 45 minutes  Signed, Lorin Glass, MD [facility] 02/28/2023

## 2023-02-28 NOTE — TOC Initial Note (Signed)
Transition of Care [facility]) - Initial/Assessment Note    Patient Details  Name: Frances Ortiz MRN: [id number] Date of Birth: 06-May-1931  Transition of [facility]) CM/SW Contact:    Deatra Robinson, [place] Phone Number: 02/28/2023, 11:43 AM  Clinical Narrative: Sherron Monday to pt re PT recommendation for SNF. Pt reports agreeable to SNF and is requesting [facility] or [facility]. Bed offers received from both pt's preferred facilities and she has accepted [facility]. Pt aware of additional $43/day out of pocket cost at [facility] and is agreeable. Updated pt's "patient advocate" Merlyn Albert [phone] at pt's request.   [employer] Medicare auth submitted and is pending at this time, ref # [id number]. SW will provide updates as available.   Dellie Burns, MSW, LCSW [phone] (coverage)                     Expected Discharge Plan: Skilled Nursing Facility Barriers to Discharge: Continued Medical Work up, English as a second language teacher   Patient Goals and CMS Choice   CMS Medicare.gov Compare Post Acute Care list provided to:: Patient Choice offered to / list presented to : Patient [place] ownership interest in [facility].provided to:: Patient    Expected Discharge Plan and Services       Living arrangements for the past 2 months: Single Family Home                                      Prior Living Arrangements/Services Living arrangements for the past 2 months: Single Family Home Lives with:: Self Patient language and need for interpreter reviewed:: Yes        Need for Family Participation in Patient Care: Yes (Comment) Care giver support system in place?: Yes (comment)   Criminal Activity/Legal Involvement Pertinent to Current Situation/Hospitalization: No - Comment as needed  Activities of Daily Living      Permission Sought/Granted Permission sought to share information with : Case Manager, Facility Medical sales representative ("pt advocate" Mitrina Terrilee Croak  [phone]) Permission granted to share information with : Yes, Verbal Permission Granted              Emotional Assessment Appearance:: Appears older than stated age, Appears stated age   Affect (typically observed): Accepting Orientation: : Oriented to Self, Oriented to Place, Oriented to  Time, Oriented to Situation Alcohol / Substance Use: Not Applicable Psych Involvement: No (comment)  Admission diagnosis:  Displacement of lumbar intervertebral disc without myelopathy [M51.26] Inadequate pain control [R52] Compression fracture of lumbar vertebra, sequela [S32.000S] Patient Active Problem List   Diagnosis Date Noted   At risk for inadequate pain control 02/25/2023   Lumbar compression fracture (HCC) 02/25/2023   Lumbar spinal stenosis 02/25/2023   S/P right colectomy 02/25/2023   Inadequate pain control 02/25/2023   Cecal cancer (HCC) 10/07/2017   Cholelithiasis 11/13/2012   PCP:  Irven Coe, MD Pharmacy:   [facility] [id number] [place], [place] - [address] [place] [address] Phone: [phone] Fax: [phone]     Social Determinants of Health (SDOH) Social History: SDOH Screenings   Tobacco Use: Low Risk  (02/27/2023)   SDOH Interventions:     Readmission Risk Interventions     No data to display

## 2023-02-28 NOTE — Plan of Care (Signed)

## 2023-02-28 NOTE — Progress Notes (Signed)
Referring Physician(s): Dr. Pola Corn  Supervising Physician: Irish Lack  Patient Status:  [facility] - In-pt  Chief Complaint: Uncontrolled lower back pain  Subjective: Patient sitting up in bed.  Lunch tray with minimal food consumed.  States she was able to get OOB and sit in chair today with PT.    Allergies: Aristocort [triamcinolone], Cortisone, Erythromycin, Other, and Hydrocodone-acetaminophen  Medications: Prior to Admission medications   Medication Sig Start Date End Date Taking? Authorizing Provider  HYDROcodone-acetaminophen (NORCO/VICODIN) 5-325 MG tablet Take 1 tablet by mouth every 6 (six) hours as needed for moderate pain.   Yes [provider]  sertraline (ZOLOFT) 50 MG tablet Take 50 mg by mouth daily.   Yes [provider]  Cholecalciferol (VITAMIN D3) 2000 UNITS capsule Take 2,000 Units by mouth daily.    [provider]  Multiple Vitamin (MULTIVITAMIN) tablet Take 1 tablet by mouth daily.     [provider]     Vital Signs: BP (!) 154/74 (BP Location: Right Arm)   Pulse 80   Temp 98.9 F (37.2 C)   Resp 16   Ht 4\' 11"  (1.499 m)   Wt 134 lb 7.7 oz (61 kg)   SpO2 95%   BMI 27.16 kg/m   Physical Exam NAD, alert Back: decreased pain with movement.  No concerns related to procedure sites.   Imaging: IR KYPHO EA ADDL LEVEL THORACIC OR LUMBAR  Result Date: 02/27/2023 INDICATION: Frances Ortiz is a 89 y.o. female who presented to the ED complaining of back pain. In February 2024, patient suffered a fall. She presented to her PCP who referred her to physical therapy twice weekly. After physical therapy sessions, she experienced pain that worsened over time. She was referred back to primary care from physical therapy for management. She was referred to pain management and treated with injections and nerve ablation that was not effective. She saw Dr. Wynetta Emery and underwent MRI lumbar spine that showed new acute superior  endplate compression deformities at the L2 and L3 vertebral body levels with retropulsion and associated moderate spinal canal narrowing at L1-L2 and moderate to severe spinal canal narrowing at L2-L3. Neurosurgery discussed kyphoplasty, but procedure was denied by insurance. Patient has been referred to abscess for management of compression fractures. She comes to our service today for L1-L2 balloon kyphoplasty. EXAM: FLUOROSCOPY GUIDED L1 CORE BONE BIOPSY FLUOROSCOPY GUIDED L1 AND L2 BALLOON KYPHOPLASTY COMPARISON:  MRI of the lumbar spine February 15, 2023. MEDICATIONS: As antibiotic prophylaxis, Ancef 2 g IV was ordered pre-procedure and administered intravenously within 1 hour of incision. All current medications are in the EMR and have been reviewed as part of this encounter. ANESTHESIA/SEDATION: Moderate (conscious) sedation was employed during this procedure. A total of Versed 2 mg and hydromorphone 1 mg was administered intravenously by the radiology nurse. Total intra-service moderate Sedation Time: 90 minutes. The patient's level of consciousness and vital signs were monitored continuously by radiology nursing throughout the procedure under my direct supervision. FLUOROSCOPY: Radiation Exposure Index (as provided by the fluoroscopic device): Peak skin dose 2997 mGy COMPLICATIONS: None immediate. PROCEDURE: Following a full explanation of the procedure along with the potential associated complications, an informed witnessed consent was obtained. The patient was placed in prone position on the angiography table. The lumbar spine region was prepped and draped in a sterile fashion. Under fluoroscopy, the L2 vertebral body was delineated and the skin area was marked. The skin was infiltrated with a 1% Lidocaine approximately 4.5  cm lateral to the spinous process projection on the right. Using a 22-gauge spinal needle, the soft issue and the peripedicular space and periosteum were infiltrated with Bupivacaine  0.5%. A skin incision was made at the access site. Subsequently, an 11-gauge Kyphon trocar was inserted under fluoroscopic guidance until contact with the pedicle was obtained. The trocar was inserted under light hammer tapping into the pedicle until the posterior boundaries of the vertebral body was reached. The diamond mandrill was removed and one core biopsy wasobtained. The skin was infiltrated with a 1% Lidocaine approximately 4.5 cm lateral to the spinous process projection on the left. Using a 22-gauge spinal needle, the soft issue and the peripedicular space and periosteum were infiltrated with Bupivacaine 0.5%. A skin incision was made at the access site. Subsequently, an 11-gauge Kyphon trocar was inserted under fluoroscopic guidance until contact with the pedicle was obtained. The trocar was inserted under light hammer tapping into the pedicle until the posterior boundaries of the vertebral body was reached. The diamond mandrill was removed. A bone drill was coaxially advanced within the anterior third of the vertebral body and then exchanged for inflatable Kyphon balloons. These were centered within the mid-aspect of the vertebral body. The balloons were inflated to create a void to serve as a repository for the bone cement. Both balloons were deflated and through both cannulas, under continuous fluoroscopy guidance in the AP and lateral views, the vertebral body was filled with previously mixed polymethyl-methacrylate (PMMA) added to barium for opacification. Both cannulas were later removed. Under fluoroscopy, the L3 vertebral body was delineated and the skin area was marked. The skin was infiltrated with a 1% Lidocaine approximately 4.6 cm lateral to the spinous process projection on the right. Using a 22-gauge spinal needle, the soft issue and the peripedicular space and periosteum were infiltrated with Bupivacaine 0.5%. A skin incision was made at the access site. Subsequently, an 11-gauge Kyphon  trocar was inserted under fluoroscopic guidance until contact with the pedicle was obtained. The trocar was inserted under light hammer tapping into the pedicle until the posterior boundaries of the vertebral body was reached. The diamond mandrill was removed. The skin was infiltrated with a 1% Lidocaine approximately 4.6 cm lateral to the spinous process projection on the left. Using a 22-gauge spinal needle, the soft issue and the peripedicular space and periosteum were infiltrated with Bupivacaine 0.5%. A skin incision was made at the access site. Subsequently, an 11-gauge Kyphon trocar was inserted under fluoroscopic guidance until contact with the pedicle was obtained. The trocar was inserted under light hammer tapping into the pedicle until the posterior boundaries of the vertebral body was reached. The diamond mandrill was removed. A bone drill was coaxially advanced within the anterior third of the vertebral body and then exchanged for inflatable Kyphon balloons. These were centered within the mid-aspect of the vertebral body. The balloons were inflated to create a void to serve as a repository for the bone cement. Both balloons were deflated and through both cannulas, under continuous fluoroscopy guidance in the AP and lateral views, the vertebral body was filled with previously mixed polymethyl-methacrylate (PMMA) added to barium for opacification. Both cannulas were later removed. The access sites were cleaned and covered with a sterile bandage. IMPRESSION: 1. Successful fluoroscopy-guided bilateral transpedicular approach for L2 vertebral body core bone biopsy and L2 and L3 kyphoplasty for treatment of reason osteoporotic fragility fracture. Bone sample obtained were sent for tissue exam. 2. If the patient has known osteoporosis, recommend  treatment as clinically indicated. If the patient's bone density status is unknown, DEXA scan is recommended. Electronically Signed   By: Baldemar Lenis  M.D.   On: 02/27/2023 14:53   IR KYPHO LUMBAR INC FX REDUCE BONE BX UNI/BIL CANNULATION INC/IMAGING  Result Date: 02/27/2023 INDICATION: Frances Ortiz is a 89 y.o. female who presented to the ED complaining of back pain. In February 2024, patient suffered a fall. She presented to her PCP who referred her to physical therapy twice weekly. After physical therapy sessions, she experienced pain that worsened over time. She was referred back to primary care from physical therapy for management. She was referred to pain management and treated with injections and nerve ablation that was not effective. She saw Dr. Wynetta Emery and underwent MRI lumbar spine that showed new acute superior endplate compression deformities at the L2 and L3 vertebral body levels with retropulsion and associated moderate spinal canal narrowing at L1-L2 and moderate to severe spinal canal narrowing at L2-L3. Neurosurgery discussed kyphoplasty, but procedure was denied by insurance. Patient has been referred to abscess for management of compression fractures. She comes to our service today for L1-L2 balloon kyphoplasty. EXAM: FLUOROSCOPY GUIDED L1 CORE BONE BIOPSY FLUOROSCOPY GUIDED L1 AND L2 BALLOON KYPHOPLASTY COMPARISON:  MRI of the lumbar spine February 15, 2023. MEDICATIONS: As antibiotic prophylaxis, Ancef 2 g IV was ordered pre-procedure and administered intravenously within 1 hour of incision. All current medications are in the EMR and have been reviewed as part of this encounter. ANESTHESIA/SEDATION: Moderate (conscious) sedation was employed during this procedure. A total of Versed 2 mg and hydromorphone 1 mg was administered intravenously by the radiology nurse. Total intra-service moderate Sedation Time: 90 minutes. The patient's level of consciousness and vital signs were monitored continuously by radiology nursing throughout the procedure under my direct supervision. FLUOROSCOPY: Radiation Exposure Index (as provided by the fluoroscopic  device): Peak skin dose 2997 mGy COMPLICATIONS: None immediate. PROCEDURE: Following a full explanation of the procedure along with the potential associated complications, an informed witnessed consent was obtained. The patient was placed in prone position on the angiography table. The lumbar spine region was prepped and draped in a sterile fashion. Under fluoroscopy, the L2 vertebral body was delineated and the skin area was marked. The skin was infiltrated with a 1% Lidocaine approximately 4.5 cm lateral to the spinous process projection on the right. Using a 22-gauge spinal needle, the soft issue and the peripedicular space and periosteum were infiltrated with Bupivacaine 0.5%. A skin incision was made at the access site. Subsequently, an 11-gauge Kyphon trocar was inserted under fluoroscopic guidance until contact with the pedicle was obtained. The trocar was inserted under light hammer tapping into the pedicle until the posterior boundaries of the vertebral body was reached. The diamond mandrill was removed and one core biopsy wasobtained. The skin was infiltrated with a 1% Lidocaine approximately 4.5 cm lateral to the spinous process projection on the left. Using a 22-gauge spinal needle, the soft issue and the peripedicular space and periosteum were infiltrated with Bupivacaine 0.5%. A skin incision was made at the access site. Subsequently, an 11-gauge Kyphon trocar was inserted under fluoroscopic guidance until contact with the pedicle was obtained. The trocar was inserted under light hammer tapping into the pedicle until the posterior boundaries of the vertebral body was reached. The diamond mandrill was removed. A bone drill was coaxially advanced within the anterior third of the vertebral body and then exchanged for inflatable Kyphon balloons. These  were centered within the mid-aspect of the vertebral body. The balloons were inflated to create a void to serve as a repository for the bone cement. Both  balloons were deflated and through both cannulas, under continuous fluoroscopy guidance in the AP and lateral views, the vertebral body was filled with previously mixed polymethyl-methacrylate (PMMA) added to barium for opacification. Both cannulas were later removed. Under fluoroscopy, the L3 vertebral body was delineated and the skin area was marked. The skin was infiltrated with a 1% Lidocaine approximately 4.6 cm lateral to the spinous process projection on the right. Using a 22-gauge spinal needle, the soft issue and the peripedicular space and periosteum were infiltrated with Bupivacaine 0.5%. A skin incision was made at the access site. Subsequently, an 11-gauge Kyphon trocar was inserted under fluoroscopic guidance until contact with the pedicle was obtained. The trocar was inserted under light hammer tapping into the pedicle until the posterior boundaries of the vertebral body was reached. The diamond mandrill was removed. The skin was infiltrated with a 1% Lidocaine approximately 4.6 cm lateral to the spinous process projection on the left. Using a 22-gauge spinal needle, the soft issue and the peripedicular space and periosteum were infiltrated with Bupivacaine 0.5%. A skin incision was made at the access site. Subsequently, an 11-gauge Kyphon trocar was inserted under fluoroscopic guidance until contact with the pedicle was obtained. The trocar was inserted under light hammer tapping into the pedicle until the posterior boundaries of the vertebral body was reached. The diamond mandrill was removed. A bone drill was coaxially advanced within the anterior third of the vertebral body and then exchanged for inflatable Kyphon balloons. These were centered within the mid-aspect of the vertebral body. The balloons were inflated to create a void to serve as a repository for the bone cement. Both balloons were deflated and through both cannulas, under continuous fluoroscopy guidance in the AP and lateral views,  the vertebral body was filled with previously mixed polymethyl-methacrylate (PMMA) added to barium for opacification. Both cannulas were later removed. The access sites were cleaned and covered with a sterile bandage. IMPRESSION: 1. Successful fluoroscopy-guided bilateral transpedicular approach for L2 vertebral body core bone biopsy and L2 and L3 kyphoplasty for treatment of reason osteoporotic fragility fracture. Bone sample obtained were sent for tissue exam. 2. If the patient has known osteoporosis, recommend treatment as clinically indicated. If the patient's bone density status is unknown, DEXA scan is recommended. Electronically Signed   By: Baldemar Lenis M.D.   On: 02/27/2023 14:53    Labs:  CBC: Recent Labs    02/25/23 1920 02/26/23 0228 02/28/23 0140  WBC 9.9 8.8 8.5  HGB 14.0 14.1 13.1  HCT 41.0 41.9 38.7  PLT 338 335 293    COAGS: Recent Labs    02/25/23 1920 02/26/23 0228  INR 1.1 1.1    BMP: Recent Labs    02/25/23 1920 02/26/23 0228 02/28/23 0140  NA 138 137 133*  K 4.0 3.8 3.7  CL 103 103 96*  CO2 24 25 27   GLUCOSE 113* 114* 92  BUN 26* 24* 17  CALCIUM 9.3 9.2 8.9  CREATININE 0.64 0.49 0.57  GFRNONAA >60 >60 >60    LIVER FUNCTION TESTS: No results for input(s): "BILITOT", "AST", "ALT", "ALKPHOS", "PROT", "ALBUMIN" in the last 8760 hours.  Assessment and Plan: Compression fracture at L1, L2 s/p kyphoplasty with biopsy at L2.  Patient resting comfortably at time of visit.  Reports improvement in back pain-- most notably in  her ability to get up OOB to the bathroom with a walker and sit in the bedside chair neither of which she states she could do prior to yesterday's procedure. No concerns with site.  L2 biopsy negative for tumor   Electronically Signed: Hoyt Koch, PA 02/28/2023, 3:23 PM   I spent a total of 15 Minutes at the the patient's bedside AND on the patient's hospital floor or unit, greater than 50% of which  was counseling/coordinating care for back pain, intractable.

## 2023-02-28 NOTE — Progress Notes (Signed)
[employer] auth received for [facility], details provided to [place] in admissions. Per MD, plan for dc tomorrow.   Dellie Burns, MSW, LCSW [phone] (coverage)

## 2023-02-28 NOTE — Evaluation (Signed)
Physical Therapy Evaluation Patient Details Name: ZEPHYR FEBLES MRN: [id number] DOB: August 01, 1931 Today's Date: 02/28/2023  History of Present Illness  Patient is a 89 year old female with uncontrolled lower back pain. Found to have acute superior endplate compression fractures at L2-L3 with moderate stenosis L1-L2 and severe stenosis L2-3. S/p kyphoplasty  Clinical Impression  Patient is agreeable to PT evaluation. She reports she is typically independent with mobility with recent decline in functional status due to back pain. She has been ambulating with a 4 wheeled walker at home and lives alone.  Today, the patient reports he overall back pain has improved. She required assistance for bed mobility with reinforcement of logroll technique. Assistance required for multiple standing bouts. Gait training initiated using rolling walker with Min A required. Recommend to continue PT to maximize independence and decrease caregiver burden. The patient is requesting to go to rehab at discharge as she lives home alone.       Recommendations for follow up therapy are one component of a multi-disciplinary discharge planning process, led by the attending physician.  Recommendations may be updated based on patient status, additional functional criteria and insurance authorization.  Follow Up Recommendations Can patient physically be transported by private vehicle: Yes     Assistance Recommended at Discharge Intermittent Supervision/Assistance  Patient can return home with the following  A little help with walking and/or transfers;A little help with bathing/dressing/bathroom;Help with stairs or ramp for entrance;Assist for transportation;Assistance with cooking/housework    Equipment Recommendations  (to be determined at next level of care)  Recommendations for Other Services       Functional Status Assessment Patient has had a recent decline in their functional status and demonstrates the ability to  make significant improvements in function in a reasonable and predictable amount of time.     Precautions / Restrictions Precautions Precautions: Fall;Back Precaution Booklet Issued: No Precaution Comments: general back precuations following kyphoplasty Restrictions Weight Bearing Restrictions: No      Mobility  Bed Mobility Overal bed mobility: Needs Assistance Bed Mobility: Rolling, Sidelying to Sit Rolling: Min assist Sidelying to sit: Min assist       General bed mobility comments: verbal cues for logroll technique.    Transfers Overall transfer level: Needs assistance Equipment used: Rolling walker (2 wheels) Transfers: Sit to/from Stand Sit to Stand: Min assist           General transfer comment: lifting assistance required for standing. several standing bouts performed from bed and from toilet.    Ambulation/Gait Ambulation/Gait assistance: Min assist Gait Distance (Feet): 15 Feet (x 2 bouts) Assistive device: Rolling walker (2 wheels) Gait Pattern/deviations: Step-to pattern, Decreased stride length, Trunk flexed, Narrow base of support Gait velocity: decreased     General Gait Details: cues to stand closer to rolling walker for support and to avoid trunk flexion. steadying assistance provided as well as occasional physical assistance for rolling walker negotiation with turns  Stairs            Wheelchair Mobility    Modified Rankin (Stroke Patients Only)       Balance Overall balance assessment: Needs assistance Sitting-balance support: Feet supported Sitting balance-Leahy Scale: Fair     Standing balance support: Bilateral upper extremity supported, No upper extremity supported Standing balance-Leahy Scale: Fair Standing balance comment: patient is able to maintain standing balance for a few seconds with no UE support. close stand by assistance provided. rolling walker used for all dynamic standing activity  Pertinent Vitals/Pain Pain Assessment Pain Assessment: Faces Faces Pain Scale: Hurts a little bit Pain Location: low back Pain Descriptors / Indicators: Sore Pain Intervention(s): Limited activity within patient's tolerance, Monitored during session, Repositioned    Home Living Family/patient expects to be discharged to:: Private residence Living Arrangements: Alone Available Help at Discharge: Family;Available PRN/intermittently Type of Home: House Home Access: Stairs to enter Entrance Stairs-Rails: None Entrance Stairs-Number of Steps: 2   Home Layout: One level Home Equipment: Shower seat;Cane - single point;Rollator (4 wheels) Additional Comments: has a caregiver at home recently to assist as needed due to limitiations from back pain.    Prior Function Prior Level of Function : Independent/Modified Independent             Mobility Comments: short distance ambulation recently secondary to back pain. used rollator for mobility       Hand Dominance        Extremity/Trunk Assessment   Upper Extremity Assessment Upper Extremity Assessment: Generalized weakness    Lower Extremity Assessment Lower Extremity Assessment: Generalized weakness       Communication   Communication: No difficulties  Cognition Arousal/Alertness: Awake/alert Behavior During Therapy: WFL for tasks assessed/performed Overall Cognitive Status: Within Functional Limits for tasks assessed                                          General Comments General comments (skin integrity, edema, etc.): patient required assistance with peri-care after bowel movement    Exercises     Assessment/Plan    PT Assessment Patient needs continued PT services  PT Problem List Decreased strength;Decreased range of motion;Decreased activity tolerance;Decreased balance;Decreased mobility;Decreased safety awareness;Decreased knowledge of use of DME       PT Treatment  Interventions DME instruction;Gait training;Stair training;Functional mobility training;Therapeutic exercise;Balance training;Therapeutic activities;Neuromuscular re-education;Cognitive remediation;Patient/family education    PT Goals (Current goals can be found in the Care Plan section)  Acute Rehab PT Goals Patient Stated Goal: to go to rehab PT Goal Formulation: With patient Time For Goal Achievement: 03/14/23 Potential to Achieve Goals: Fair    Frequency Min 3X/week     Co-evaluation               AM-PAC PT "6 Clicks" Mobility  Outcome Measure Help needed turning from your back to your side while in a flat bed without using bedrails?: A Little Help needed moving from lying on your back to sitting on the side of a flat bed without using bedrails?: A Little Help needed moving to and from a bed to a chair (including a wheelchair)?: A Little Help needed standing up from a chair using your arms (e.g., wheelchair or bedside chair)?: A Little Help needed to walk in hospital room?: A Little Help needed climbing 3-5 steps with a railing? : A Lot 6 Click Score: 17    End of Session Equipment Utilized During Treatment: Gait belt Activity Tolerance: Patient tolerated treatment well Patient left: in chair;with call bell/phone within reach;with chair alarm set Nurse Communication: Mobility status PT Visit Diagnosis: Other abnormalities of gait and mobility (R26.89);Difficulty in walking, not elsewhere classified (R26.2)    Time: 7829-5621 PT Time Calculation (min) (ACUTE ONLY): 47 min   Charges:   PT Evaluation $PT Eval Low Complexity: 1 Low PT Treatments $Gait Training: 8-22 mins        Donna Bernard, PT, MPT   [place]  Karlyn Agee 02/28/2023, 10:39 AM

## 2023-02-28 NOTE — [place] FL2 (Signed)
[place] MEDICAID FL2 LEVEL OF CARE FORM     IDENTIFICATION  Patient Name: Frances Ortiz Birthdate: 1931-09-11 Sex: female Admission Date (Current Location): 02/25/2023  [facility] and [place] Number:  Producer, television/film/video and Address:  [facility], [address] [place], [place] [address]      Provider Number: [id number]  Attending Physician Name and Address:  Lorin Glass, MD  Relative Name and Phone Number:       Current Level of Care: Hospital Recommended Level of Care: Skilled Nursing Facility Prior Approval Number:    Date Approved/Denied:   PASRR Number: [id number]  Discharge Plan: SNF    Current Diagnoses: Patient Active Problem List   Diagnosis Date Noted   At risk for inadequate pain control 02/25/2023   Lumbar compression fracture (HCC) 02/25/2023   Lumbar spinal stenosis 02/25/2023   S/P right colectomy 02/25/2023   Inadequate pain control 02/25/2023   Cecal cancer (HCC) 10/07/2017   Cholelithiasis 11/13/2012    Orientation RESPIRATION BLADDER Height & Weight     Self, Time, Situation, Place  Normal Continent Weight: 134 lb 7.7 oz (61 kg) Height:  4\' 11"  (149.9 cm)  BEHAVIORAL SYMPTOMS/MOOD NEUROLOGICAL BOWEL NUTRITION STATUS      Continent    AMBULATORY STATUS COMMUNICATION OF NEEDS Skin   Limited Assist Verbally Surgical wounds                       Personal Care Assistance Level of Assistance  Bathing, Feeding, Dressing Bathing Assistance: Limited assistance Feeding assistance: Limited assistance Dressing Assistance: Limited assistance     Functional Limitations Info  Sight, Hearing, Speech Sight Info: Adequate Hearing Info: Adequate Speech Info: Adequate    SPECIAL CARE FACTORS FREQUENCY  PT (By licensed PT), OT (By licensed OT)                    Contractures Contractures Info: Not present    Additional Factors Info  Code Status Code Status Info: DNR             Current Medications  (02/28/2023):  This is the current hospital active medication list Current Facility-Administered Medications  Medication Dose Route Frequency Provider Last Rate Last Admin   acetaminophen (TYLENOL) tablet 1,000 mg  1,000 mg Oral TID Lorin Glass, MD   1,000 mg at 02/28/23 0915   enoxaparin (LOVENOX) injection 40 mg  40 mg Subcutaneous Daily Alex Gardener T, NP   40 mg at 02/28/23 0915   hydrALAZINE (APRESOLINE) injection 10 mg  10 mg Intravenous Q6H PRN Dahal, Melina Schools, MD       HYDROmorphone (DILAUDID) injection 1 mg  1 mg Intravenous Q4H PRN Crosley, Debby, MD   1 mg at 02/26/23 0900   iohexol (OMNIPAQUE) 300 MG/ML solution 50 mL  50 mL Intravenous Once PRN de Melchor Amour, Jerilynn Mages, MD       iohexol (OMNIPAQUE) 300 MG/ML solution 50 mL  50 mL Intravenous Once PRN de Melchor Amour, Jerilynn Mages, MD       lidocaine (LIDODERM) 5 % 1 patch  1 patch Transdermal Q24H Crosley, Debby, MD   1 patch at 02/27/23 2158   melatonin tablet 5 mg  5 mg Oral QHS PRN Crosley, Debby, MD       naloxone (NARCAN) injection 0.4 mg  0.4 mg Intravenous PRN Crosley, Debby, MD       ondansetron (ZOFRAN) tablet 4 mg  4 mg Oral Q6H PRN Crosley, Debby,  MD       Or   ondansetron (ZOFRAN) injection 4 mg  4 mg Intravenous Q6H PRN Crosley, Debby, MD       oxyCODONE (Oxy IR/ROXICODONE) immediate release tablet 5 mg  5 mg Oral Q6H Dahal, Binaya, MD   5 mg at 02/28/23 1033   polyethylene glycol (MIRALAX / GLYCOLAX) packet 17 g  17 g Oral Daily PRN Dahal, Melina Schools, MD       senna-docusate (Senokot-S) tablet 1 tablet  1 tablet Oral QHS Dahal, Melina Schools, MD   1 tablet at 02/27/23 2158   sertraline (ZOLOFT) tablet 50 mg  50 mg Oral Daily Gery Pray, MD   50 mg at 02/28/23 0915     Discharge Medications: Please see discharge summary for a list of discharge medications.  Relevant Imaging Results:  Relevant Lab Results:   Additional Information SS# [id number]  Deatra Robinson, [place]

## 2023-03-01 DIAGNOSIS — R52 Pain, unspecified: Secondary | ICD-10-CM | POA: Diagnosis not present

## 2023-03-01 MED ORDER — HYDROCODONE-ACETAMINOPHEN 5-325 MG PO TABS: 1.0000 | ORAL_TABLET | Freq: Four times a day (QID) | ORAL | 0 refills | Status: AC | PRN

## 2023-03-01 MED ORDER — MELATONIN 5 MG PO TABS: 5.0000 mg | ORAL_TABLET | Freq: Every evening | ORAL | 0 refills | Status: DC | PRN

## 2023-03-01 MED ORDER — SENNOSIDES-DOCUSATE SODIUM 8.6-50 MG PO TABS: 1.0000 | ORAL_TABLET | Freq: Two times a day (BID) | ORAL | Status: DC

## 2023-03-01 MED ORDER — ACETAMINOPHEN 500 MG PO TABS: 1000.0000 mg | ORAL_TABLET | Freq: Three times a day (TID) | ORAL | 0 refills | Status: AC

## 2023-03-01 MED ORDER — POLYETHYLENE GLYCOL 3350 17 G PO PACK: 17.0000 g | PACK | Freq: Every day | ORAL | 0 refills | Status: DC | PRN

## 2023-03-01 MED ORDER — LIDOCAINE 4 % EX PTCH: 1.0000 | MEDICATED_PATCH | Freq: Every day | CUTANEOUS | Status: AC | PRN

## 2023-03-01 NOTE — TOC Transition Note (Signed)
Transition of Care [facility]) - CM/SW Discharge Note   Patient Details  Name: Frances Ortiz MRN: [id number] Date of Birth: [birth year]  Transition of Care [facility]) CM/SW Contact:  Deatra Robinson, [place] Phone Number: 03/01/2023, 12:30 PM   Clinical Narrative: Pt for dc to [facility] today. Pt aware of dc and reports agreeable. Confirmed with Whitney at [facility] they are prepared to admit pt to room 112. RN provided with number for report and PTAR arranged for transport. SW signing off at dc.   Dellie Burns, MSW, LCSW [phone] (coverage)        Final next level of care: Skilled Nursing Facility Barriers to Discharge: No Barriers Identified   Patient Goals and CMS Choice CMS Medicare.gov Compare Post Acute Care list provided to:: Patient Choice offered to / list presented to : Patient  Discharge Placement                Patient chooses bed at: [facility] Patient to be transferred to facility by: PTAR Name of family member notified: Pt to update family Patient and family notified of of transfer: 03/01/23  Discharge Plan and Services Additional resources added to the After Visit Summary for                                       Social Determinants of Health (SDOH) Interventions SDOH Screenings   Tobacco Use: Low Risk  (02/27/2023)     Readmission Risk Interventions     No data to display

## 2023-03-01 NOTE — Discharge Summary (Signed)
Physician Discharge Summary  [name] WNU:[id number] DOB: 05-Oct-1931 DOA: 02/25/2023  PCP: Irven Coe, MD  Admit date: 02/25/2023 Discharge date: 03/01/2023  Admitted From: Home Discharge disposition: SNF  Recommendations at discharge:  Judicious use of pain medicines Regular use of bowel regimen   Brief narrative: Frances Ortiz is a 89 y.o. female with PMH significant for cecal cancer s/p right colectomy, hyperlipidemia, GERD, anxiety who lives alone and at baseline, she was independent in her ADLs, cleaning and gardening. 5/6, patient presented to the ED from home with intractable back pain, progressively worsening since fall in February. In the first week of February, patient had a fall, seen by PCP who started her on physical therapy.  However pain continued to worsen.  PCP sent her to pain management where she received injections and nerve ablation despite which she did not have significant relief.  She was seen by neurosurgeon Dr. Wynetta Emery.  4/26, MRI L spine was done which showed L2/3 acute superior endplate compression deformities and moderate to severe spinal canal narrowing.  Discussion re kyphoplasty was initiated but apparently her insurance denied kyphoplasty.  She has been having severe pain significantly limiting her ADLs, no loss of bowel or bladder function.  She had been essentially bedbound for approximately 2 to 3 weeks despite taking Vicodin every 6 hours.  Ultimately presented to ED on 5/6.    In the ED, hemodynamically stable Labs mostly unremarkable Neurosurgeon recommended inpatient admission Admitted to [facility] Per neurosurgery recommendation, IR has been consulted for vertebroplasty.  Subjective: Patient was seen and examined this morning.   Propped up in bed.  Not in distress.  Pain much better.  Started to have bowel movement now Feels ready for rehab today.  Hospital course: Intractable back pain Acute superior endplate compression fractures at L2 and  L3  Moderate spinal stenosis at L1-2  Severe spinal stenosis at L2-3. Onset after a fall in February 2024.   MRI 4/26 as above. 5/8, underwent kyphoplasty by IR. For pain management patient is currently on scheduled Tylenol and oxycodone.  Continue the same post discharge. Bowel regimen with scheduled Senokot, as needed MiraLAX.   PT eval obtained.  SNF recommended.  Elevated blood pressure No history of hypertension, not on medicines at home.  Because of pain, her blood pressure running elevated in the hospital.  With pain control, blood pressure is improving.  Not any scheduled antihypertensives.  No need at discharge.    Dehydration BUN/creatinine ratio was elevated on admission.  Adequately hydrated   Anxiety Sertraline 50 mg daily  HLD Does not seem to be on meds  cecal cancer  s/p right colectomy   Goals of care   Code Status: DNR   Wounds:  - Incision - 4 Ports Abdomen 1: Umbilicus 2: Mid;Upper 3: Right;Medial 4: Right;Lateral (Active)  Placement Date/Time: 11/27/12 1102   Location of Ports: Abdomen  Port: 1:  Location Orientation: Umbilicus  Port: 2:  Location Orientation: Mid;Upper  Port: 3:  Location Orientation: Right;Medial  Port: 4:  Location Orientation: Right;Lateral    Assessments 11/27/2012 11:33 AM 11/27/2012  2:13 PM  Port 1 Site Assessment -- [employer] 1 Margins -- Attached edges (approximated)  Port 1 Dressing Type Liquid skin adhesive --  Port 2 Dressing Type Liquid skin adhesive --  Port 3 Site Assessment -- Clean;Dry  Port 3 Margins -- Attached edges (approximated)  Port 3 Dressing Type Liquid skin adhesive --  Port 4 Site Assessment -- Clean;Dry  Port 4 Margins -- Attached edges (approximated)  Port 4 Dressing Type Liquid skin adhesive --     No associated orders.     Wound / Incision (Open or Dehisced) 02/27/23 Puncture Vertebral column Lower;Right;Left (Active)  Date First Assessed/Time First Assessed: 02/27/23 1048   Wound Type: (c)  Puncture  Location: Vertebral column  Location Orientation: (c) Lower;Right;Left    Assessments 02/27/2023 10:48 AM 02/28/2023  8:10 PM  Dressing Type Gauze (Comment);Transparent dressing --  Dressing Changed New --  Dressing Status Clean, Dry, Intact Clean, Dry, Intact  Dressing Change Frequency PRN --  Site / Wound Assessment Clean;Dry Dressing in place / Unable to assess     No associated orders.    Discharge Exam:   Vitals:   02/28/23 0729 02/28/23 1500 02/28/23 2251 03/01/23 0806  BP: (!) 154/74 125/68 (!) 140/77 129/67  Pulse: 80 84 89 99  Resp: 16 17 18    Temp: 98.9 F (37.2 C) 98 F (36.7 C) 98.8 F (37.1 C) 98.5 F (36.9 C)  TempSrc:   Oral Oral  SpO2: 95% 96% 93% 93%  Weight:      Height:        Body mass index is 27.16 kg/m.   General exam: Pleasant, elderly Caucasian female.  Pain improving. Skin: No rashes, lesions or ulcers.  looking dry HEENT: Atraumatic, normocephalic, no obvious bleeding Lungs: Clear to auscultation bilaterally CVS: Regular rate and rhythm, no murmur GI/Abd soft, nontender, nondistended, bowel sound present CNS: Alert, awake, oriented x 3. Psychiatry: Mood appropriate Extremities: No pedal edema, no calf tenderness  Follow ups:    Contact information for follow-up providers     Irven Coe, MD Follow up.   Specialty: Family Medicine Contact information: [address] [place] [address] [place] [address] [phone]              Contact information for after-discharge care     Destination     [facility]REFERRED SNF[facility]ALF .   Service: Skilled Nursing Contact information: [address] [place] [address] [phone]                     Discharge Instructions:   Discharge Instructions     Call MD for:  difficulty breathing, headache or visual disturbances   Complete by: As directed    Call MD for:  extreme fatigue   Complete by: As directed    Call MD for:  hives   Complete by:  As directed    Call MD for:  persistant dizziness or light-headedness   Complete by: As directed    Call MD for:  persistant nausea and vomiting   Complete by: As directed    Call MD for:  severe uncontrolled pain   Complete by: As directed    Call MD for:  temperature >100.4   Complete by: As directed    Diet general   Complete by: As directed    Discharge instructions   Complete by: As directed    Recommendations at discharge:   Judicious use of pain medicines  Regular use of bowel regimen  General discharge instructions: Follow with Primary MD Irven Coe, MD in 7 days  Please request your PCP  to go over your hospital tests, procedures, radiology results at the follow up. Please get your medicines reviewed and adjusted.  Your PCP may decide to repeat certain labs or tests as needed. Do not drive, operate heavy machinery, perform activities at heights,  swimming or participation in water activities or provide baby sitting services if your were admitted for syncope or siezures until you have seen by Primary MD or a Neurologist and advised to do so again. [place] Controlled Substance Reporting System database was reviewed. Do not drive, operate heavy machinery, perform activities at heights, swim, participate in water activities or provide baby-sitting services while on medications for pain, sleep and mood until your outpatient physician has reevaluated you and advised to do so again.  You are strongly recommended to comply with the dose, frequency and duration of prescribed medications. Activity: As tolerated with Full fall precautions use walker/cane & assistance as needed Avoid using any recreational substances like cigarette, tobacco, alcohol, or non-prescribed drug. If you experience worsening of your admission symptoms, develop shortness of breath, life threatening emergency, suicidal or homicidal thoughts you must seek medical attention immediately by calling 911 or calling  your MD immediately  if symptoms less severe. You must read complete instructions/literature along with all the possible adverse reactions/side effects for all the medicines you take and that have been prescribed to you. Take any new medicine only after you have completely understood and accepted all the possible adverse reactions/side effects.  Wear Seat belts while driving. You were cared for by a hospitalist during your hospital stay. If you have any questions about your discharge medications or the care you received while you were in the hospital after you are discharged, you can call the unit and ask to speak with the hospitalist or the covering physician. Once you are discharged, your primary care physician will handle any further medical issues. Please note that NO REFILLS for any discharge medications will be authorized once you are discharged, as it is imperative that you return to your primary care physician (or establish a relationship with a primary care physician if you do not have one).   Discharge wound care:   Complete by: As directed    Increase activity slowly   Complete by: As directed        Discharge Medications:   Allergies as of 03/01/2023       Reactions   Aristocort [triamcinolone] Other (See Comments)   Emotional instability   Cortisone Other (See Comments)   Emotional instability   Erythromycin Other (See Comments)   Stomach pain   Other Other (See Comments)   Epinephrine in novocain makes heart race.   Hydrocodone-acetaminophen Rash        Medication List     TAKE these medications    acetaminophen 500 MG tablet Commonly known as: TYLENOL Take 2 tablets (1,000 mg total) by mouth 3 (three) times daily for 7 days.   HYDROcodone-acetaminophen 5-325 MG tablet Commonly known as: NORCO/VICODIN Take 1 tablet by mouth every 6 (six) hours as needed for up to 3 days for moderate pain.   lidocaine 4 % Place 1 patch onto the skin daily as needed for up to 7  days (pain).   melatonin 5 MG Tabs Take 1 tablet (5 mg total) by mouth at bedtime as needed.   multivitamin tablet Take 1 tablet by mouth daily.   polyethylene glycol 17 g packet Commonly known as: MIRALAX / GLYCOLAX Take 17 g by mouth daily as needed.   senna-docusate 8.6-50 MG tablet Commonly known as: Senokot-S Take 1 tablet by mouth 2 (two) times daily.   sertraline 50 MG tablet Commonly known as: ZOLOFT Take 50 mg by mouth daily.   Vitamin D3 50 MCG (2000 UT)  capsule Take 2,000 Units by mouth daily.               Discharge Care Instructions  (From admission, onward)           Start     Ordered   03/01/23 0000  Discharge wound care:        03/01/23 1125             The results of significant diagnostics from this hospitalization (including imaging, microbiology, ancillary and laboratory) are listed below for reference.    Procedures and Diagnostic Studies:   No results found.   Labs:   Basic Metabolic Panel: Recent Labs  Lab 02/25/23 1920 02/26/23 0228 02/28/23 0140  NA 138 137 133*  K 4.0 3.8 3.7  CL 103 103 96*  CO2 24 25 27   GLUCOSE 113* 114* 92  BUN 26* 24* 17  CREATININE 0.64 0.49 0.57  CALCIUM 9.3 9.2 8.9  MG  --  2.3  --    GFR Estimated Creatinine Clearance: 37.9 mL/min (by C-G formula based on SCr of 0.57 mg/dL). Liver Function Tests: No results for input(s): "AST", "ALT", "ALKPHOS", "BILITOT", "PROT", "ALBUMIN" in the last 168 hours. No results for input(s): "LIPASE", "AMYLASE" in the last 168 hours. No results for input(s): "AMMONIA" in the last 168 hours. Coagulation profile Recent Labs  Lab 02/25/23 1920 02/26/23 0228  INR 1.1 1.1    CBC: Recent Labs  Lab 02/25/23 1920 02/26/23 0228 02/28/23 0140  WBC 9.9 8.8 8.5  NEUTROABS 6.9 6.1 6.0  HGB 14.0 14.1 13.1  HCT 41.0 41.9 38.7  MCV 95.6 96.1 96.0  PLT 338 335 293   Cardiac Enzymes: No results for input(s): "CKTOTAL", "CKMB", "CKMBINDEX", "TROPONINI"  in the last 168 hours. BNP: Invalid input(s): "POCBNP" CBG: No results for input(s): "GLUCAP" in the last 168 hours. D-Dimer No results for input(s): "DDIMER" in the last 72 hours. Hgb A1c No results for input(s): "HGBA1C" in the last 72 hours. Lipid Profile No results for input(s): "CHOL", "HDL", "LDLCALC", "TRIG", "CHOLHDL", "LDLDIRECT" in the last 72 hours. Thyroid function studies No results for input(s): "TSH", "T4TOTAL", "T3FREE", "THYROIDAB" in the last 72 hours.  Invalid input(s): "FREET3" Anemia work up No results for input(s): "VITAMINB12", "FOLATE", "FERRITIN", "TIBC", "IRON", "RETICCTPCT" in the last 72 hours. Microbiology No results found for this or any previous visit (from the past 240 hour(s)).  Time coordinating discharge: 45 minutes  Signed: Rameen Gohlke  [facility] 03/01/2023, 11:25 AM

## 2023-03-01 NOTE — Plan of Care (Signed)
  Problem: Education: Goal: Knowledge of General Education information will improve Description: Including pain rating scale, medication(s)/side effects and non-pharmacologic comfort measures Outcome: Adequate for Discharge   

## 2023-03-01 NOTE — Care Management Important Message (Signed)
Important Message  Patient Details  Name: Frances Ortiz MRN: [id number] Date of Birth: Sep 02, 1931   Medicare Important Message Given:  Yes     Sherilyn Banker 03/01/2023, 10:39 AM

## 2023-03-02 DIAGNOSIS — K219 Gastro-esophageal reflux disease without esophagitis: Secondary | ICD-10-CM | POA: Diagnosis not present

## 2023-03-02 DIAGNOSIS — R2689 Other abnormalities of gait and mobility: Secondary | ICD-10-CM | POA: Diagnosis not present

## 2023-03-02 DIAGNOSIS — M810 Age-related osteoporosis without current pathological fracture: Secondary | ICD-10-CM | POA: Diagnosis not present

## 2023-03-02 DIAGNOSIS — F331 Major depressive disorder, recurrent, moderate: Secondary | ICD-10-CM | POA: Diagnosis not present

## 2023-03-02 DIAGNOSIS — E785 Hyperlipidemia, unspecified: Secondary | ICD-10-CM | POA: Diagnosis not present

## 2023-03-02 DIAGNOSIS — F429 Obsessive-compulsive disorder, unspecified: Secondary | ICD-10-CM | POA: Diagnosis not present

## 2023-03-02 DIAGNOSIS — Z85828 Personal history of other malignant neoplasm of skin: Secondary | ICD-10-CM | POA: Diagnosis not present

## 2023-03-02 DIAGNOSIS — Z7401 Bed confinement status: Secondary | ICD-10-CM | POA: Diagnosis not present

## 2023-03-02 DIAGNOSIS — I10 Essential (primary) hypertension: Secondary | ICD-10-CM | POA: Diagnosis not present

## 2023-03-02 DIAGNOSIS — S32000D Wedge compression fracture of unspecified lumbar vertebra, subsequent encounter for fracture with routine healing: Secondary | ICD-10-CM | POA: Diagnosis not present

## 2023-03-02 DIAGNOSIS — M48061 Spinal stenosis, lumbar region without neurogenic claudication: Secondary | ICD-10-CM | POA: Diagnosis not present

## 2023-03-02 DIAGNOSIS — F419 Anxiety disorder, unspecified: Secondary | ICD-10-CM | POA: Diagnosis not present

## 2023-03-02 DIAGNOSIS — W19XXXD Unspecified fall, subsequent encounter: Secondary | ICD-10-CM | POA: Diagnosis not present

## 2023-03-02 DIAGNOSIS — F32A Depression, unspecified: Secondary | ICD-10-CM | POA: Diagnosis not present

## 2023-03-02 DIAGNOSIS — E559 Vitamin D deficiency, unspecified: Secondary | ICD-10-CM | POA: Diagnosis not present

## 2023-03-02 DIAGNOSIS — F4320 Adjustment disorder, unspecified: Secondary | ICD-10-CM | POA: Diagnosis not present

## 2023-03-02 DIAGNOSIS — R52 Pain, unspecified: Secondary | ICD-10-CM | POA: Diagnosis not present

## 2023-03-02 DIAGNOSIS — M62830 Muscle spasm of back: Secondary | ICD-10-CM | POA: Diagnosis not present

## 2023-03-02 DIAGNOSIS — Z9049 Acquired absence of other specified parts of digestive tract: Secondary | ICD-10-CM | POA: Diagnosis not present

## 2023-03-02 DIAGNOSIS — G4700 Insomnia, unspecified: Secondary | ICD-10-CM | POA: Diagnosis not present

## 2023-03-02 DIAGNOSIS — C180 Malignant neoplasm of cecum: Secondary | ICD-10-CM | POA: Diagnosis not present

## 2023-03-02 DIAGNOSIS — S32020D Wedge compression fracture of second lumbar vertebra, subsequent encounter for fracture with routine healing: Secondary | ICD-10-CM | POA: Diagnosis not present

## 2023-03-02 NOTE — Progress Notes (Signed)
Patient leaving with transport in stable condition; voided on bedpan prior to stretcher transfer; belongings with patient on stretcher. PIV removed prior to assignment change.

## 2023-03-04 DIAGNOSIS — S32020D Wedge compression fracture of second lumbar vertebra, subsequent encounter for fracture with routine healing: Secondary | ICD-10-CM | POA: Diagnosis not present

## 2023-03-04 DIAGNOSIS — R2689 Other abnormalities of gait and mobility: Secondary | ICD-10-CM | POA: Diagnosis not present

## 2023-03-04 DIAGNOSIS — F32A Depression, unspecified: Secondary | ICD-10-CM | POA: Diagnosis not present

## 2023-03-04 DIAGNOSIS — F419 Anxiety disorder, unspecified: Secondary | ICD-10-CM | POA: Diagnosis not present

## 2023-03-07 DIAGNOSIS — M62830 Muscle spasm of back: Secondary | ICD-10-CM | POA: Diagnosis not present

## 2023-03-07 DIAGNOSIS — R2689 Other abnormalities of gait and mobility: Secondary | ICD-10-CM | POA: Diagnosis not present

## 2023-03-13 DIAGNOSIS — F429 Obsessive-compulsive disorder, unspecified: Secondary | ICD-10-CM | POA: Diagnosis not present

## 2023-03-13 DIAGNOSIS — F331 Major depressive disorder, recurrent, moderate: Secondary | ICD-10-CM | POA: Diagnosis not present

## 2023-03-13 DIAGNOSIS — F4320 Adjustment disorder, unspecified: Secondary | ICD-10-CM | POA: Diagnosis not present

## 2023-03-29 DIAGNOSIS — Z85038 Personal history of other malignant neoplasm of large intestine: Secondary | ICD-10-CM | POA: Diagnosis not present

## 2023-03-29 DIAGNOSIS — H269 Unspecified cataract: Secondary | ICD-10-CM | POA: Diagnosis not present

## 2023-03-29 DIAGNOSIS — F322 Major depressive disorder, single episode, severe without psychotic features: Secondary | ICD-10-CM | POA: Diagnosis not present

## 2023-03-29 DIAGNOSIS — K5790 Diverticulosis of intestine, part unspecified, without perforation or abscess without bleeding: Secondary | ICD-10-CM | POA: Diagnosis not present

## 2023-03-29 DIAGNOSIS — Z9089 Acquired absence of other organs: Secondary | ICD-10-CM | POA: Diagnosis not present

## 2023-03-29 DIAGNOSIS — Z9181 History of falling: Secondary | ICD-10-CM | POA: Diagnosis not present

## 2023-03-29 DIAGNOSIS — M48061 Spinal stenosis, lumbar region without neurogenic claudication: Secondary | ICD-10-CM | POA: Diagnosis not present

## 2023-03-29 DIAGNOSIS — M8008XD Age-related osteoporosis with current pathological fracture, vertebra(e), subsequent encounter for fracture with routine healing: Secondary | ICD-10-CM | POA: Diagnosis not present

## 2023-03-29 DIAGNOSIS — M1990 Unspecified osteoarthritis, unspecified site: Secondary | ICD-10-CM | POA: Diagnosis not present

## 2023-03-29 DIAGNOSIS — F419 Anxiety disorder, unspecified: Secondary | ICD-10-CM | POA: Diagnosis not present

## 2023-03-29 DIAGNOSIS — L659 Nonscarring hair loss, unspecified: Secondary | ICD-10-CM | POA: Diagnosis not present

## 2023-03-29 DIAGNOSIS — F5101 Primary insomnia: Secondary | ICD-10-CM | POA: Diagnosis not present

## 2023-03-29 DIAGNOSIS — E7800 Pure hypercholesterolemia, unspecified: Secondary | ICD-10-CM | POA: Diagnosis not present

## 2023-03-29 DIAGNOSIS — Z9049 Acquired absence of other specified parts of digestive tract: Secondary | ICD-10-CM | POA: Diagnosis not present

## 2023-03-29 DIAGNOSIS — K219 Gastro-esophageal reflux disease without esophagitis: Secondary | ICD-10-CM | POA: Diagnosis not present

## 2023-04-01 DIAGNOSIS — Z9089 Acquired absence of other organs: Secondary | ICD-10-CM | POA: Diagnosis not present

## 2023-04-01 DIAGNOSIS — Z9049 Acquired absence of other specified parts of digestive tract: Secondary | ICD-10-CM | POA: Diagnosis not present

## 2023-04-01 DIAGNOSIS — M1990 Unspecified osteoarthritis, unspecified site: Secondary | ICD-10-CM | POA: Diagnosis not present

## 2023-04-01 DIAGNOSIS — K219 Gastro-esophageal reflux disease without esophagitis: Secondary | ICD-10-CM | POA: Diagnosis not present

## 2023-04-01 DIAGNOSIS — M48061 Spinal stenosis, lumbar region without neurogenic claudication: Secondary | ICD-10-CM | POA: Diagnosis not present

## 2023-04-01 DIAGNOSIS — F5101 Primary insomnia: Secondary | ICD-10-CM | POA: Diagnosis not present

## 2023-04-01 DIAGNOSIS — H269 Unspecified cataract: Secondary | ICD-10-CM | POA: Diagnosis not present

## 2023-04-01 DIAGNOSIS — K5790 Diverticulosis of intestine, part unspecified, without perforation or abscess without bleeding: Secondary | ICD-10-CM | POA: Diagnosis not present

## 2023-04-01 DIAGNOSIS — M8008XD Age-related osteoporosis with current pathological fracture, vertebra(e), subsequent encounter for fracture with routine healing: Secondary | ICD-10-CM | POA: Diagnosis not present

## 2023-04-01 DIAGNOSIS — F322 Major depressive disorder, single episode, severe without psychotic features: Secondary | ICD-10-CM | POA: Diagnosis not present

## 2023-04-01 DIAGNOSIS — Z85038 Personal history of other malignant neoplasm of large intestine: Secondary | ICD-10-CM | POA: Diagnosis not present

## 2023-04-01 DIAGNOSIS — L659 Nonscarring hair loss, unspecified: Secondary | ICD-10-CM | POA: Diagnosis not present

## 2023-04-01 DIAGNOSIS — E7800 Pure hypercholesterolemia, unspecified: Secondary | ICD-10-CM | POA: Diagnosis not present

## 2023-04-01 DIAGNOSIS — Z9181 History of falling: Secondary | ICD-10-CM | POA: Diagnosis not present

## 2023-04-01 DIAGNOSIS — F419 Anxiety disorder, unspecified: Secondary | ICD-10-CM | POA: Diagnosis not present

## 2023-04-04 DIAGNOSIS — L659 Nonscarring hair loss, unspecified: Secondary | ICD-10-CM | POA: Diagnosis not present

## 2023-04-04 DIAGNOSIS — H269 Unspecified cataract: Secondary | ICD-10-CM | POA: Diagnosis not present

## 2023-04-04 DIAGNOSIS — K5790 Diverticulosis of intestine, part unspecified, without perforation or abscess without bleeding: Secondary | ICD-10-CM | POA: Diagnosis not present

## 2023-04-04 DIAGNOSIS — Z9181 History of falling: Secondary | ICD-10-CM | POA: Diagnosis not present

## 2023-04-04 DIAGNOSIS — Z9989 Dependence on other enabling machines and devices: Secondary | ICD-10-CM | POA: Diagnosis not present

## 2023-04-04 DIAGNOSIS — Z9049 Acquired absence of other specified parts of digestive tract: Secondary | ICD-10-CM | POA: Diagnosis not present

## 2023-04-04 DIAGNOSIS — F419 Anxiety disorder, unspecified: Secondary | ICD-10-CM | POA: Diagnosis not present

## 2023-04-04 DIAGNOSIS — M48061 Spinal stenosis, lumbar region without neurogenic claudication: Secondary | ICD-10-CM | POA: Diagnosis not present

## 2023-04-04 DIAGNOSIS — Z9089 Acquired absence of other organs: Secondary | ICD-10-CM | POA: Diagnosis not present

## 2023-04-04 DIAGNOSIS — M1990 Unspecified osteoarthritis, unspecified site: Secondary | ICD-10-CM | POA: Diagnosis not present

## 2023-04-04 DIAGNOSIS — M545 Low back pain, unspecified: Secondary | ICD-10-CM | POA: Diagnosis not present

## 2023-04-04 DIAGNOSIS — E7800 Pure hypercholesterolemia, unspecified: Secondary | ICD-10-CM | POA: Diagnosis not present

## 2023-04-04 DIAGNOSIS — M810 Age-related osteoporosis without current pathological fracture: Secondary | ICD-10-CM | POA: Diagnosis not present

## 2023-04-04 DIAGNOSIS — Z85038 Personal history of other malignant neoplasm of large intestine: Secondary | ICD-10-CM | POA: Diagnosis not present

## 2023-04-04 DIAGNOSIS — M8008XD Age-related osteoporosis with current pathological fracture, vertebra(e), subsequent encounter for fracture with routine healing: Secondary | ICD-10-CM | POA: Diagnosis not present

## 2023-04-04 DIAGNOSIS — F5101 Primary insomnia: Secondary | ICD-10-CM | POA: Diagnosis not present

## 2023-04-04 DIAGNOSIS — F322 Major depressive disorder, single episode, severe without psychotic features: Secondary | ICD-10-CM | POA: Diagnosis not present

## 2023-04-04 DIAGNOSIS — K219 Gastro-esophageal reflux disease without esophagitis: Secondary | ICD-10-CM | POA: Diagnosis not present

## 2023-04-08 DIAGNOSIS — H269 Unspecified cataract: Secondary | ICD-10-CM | POA: Diagnosis not present

## 2023-04-08 DIAGNOSIS — Z9049 Acquired absence of other specified parts of digestive tract: Secondary | ICD-10-CM | POA: Diagnosis not present

## 2023-04-08 DIAGNOSIS — L659 Nonscarring hair loss, unspecified: Secondary | ICD-10-CM | POA: Diagnosis not present

## 2023-04-08 DIAGNOSIS — K5790 Diverticulosis of intestine, part unspecified, without perforation or abscess without bleeding: Secondary | ICD-10-CM | POA: Diagnosis not present

## 2023-04-08 DIAGNOSIS — E7800 Pure hypercholesterolemia, unspecified: Secondary | ICD-10-CM | POA: Diagnosis not present

## 2023-04-08 DIAGNOSIS — F322 Major depressive disorder, single episode, severe without psychotic features: Secondary | ICD-10-CM | POA: Diagnosis not present

## 2023-04-08 DIAGNOSIS — F5101 Primary insomnia: Secondary | ICD-10-CM | POA: Diagnosis not present

## 2023-04-08 DIAGNOSIS — Z9089 Acquired absence of other organs: Secondary | ICD-10-CM | POA: Diagnosis not present

## 2023-04-08 DIAGNOSIS — Z9181 History of falling: Secondary | ICD-10-CM | POA: Diagnosis not present

## 2023-04-08 DIAGNOSIS — M1990 Unspecified osteoarthritis, unspecified site: Secondary | ICD-10-CM | POA: Diagnosis not present

## 2023-04-08 DIAGNOSIS — M48061 Spinal stenosis, lumbar region without neurogenic claudication: Secondary | ICD-10-CM | POA: Diagnosis not present

## 2023-04-08 DIAGNOSIS — M8008XD Age-related osteoporosis with current pathological fracture, vertebra(e), subsequent encounter for fracture with routine healing: Secondary | ICD-10-CM | POA: Diagnosis not present

## 2023-04-08 DIAGNOSIS — Z85038 Personal history of other malignant neoplasm of large intestine: Secondary | ICD-10-CM | POA: Diagnosis not present

## 2023-04-08 DIAGNOSIS — F419 Anxiety disorder, unspecified: Secondary | ICD-10-CM | POA: Diagnosis not present

## 2023-04-08 DIAGNOSIS — K219 Gastro-esophageal reflux disease without esophagitis: Secondary | ICD-10-CM | POA: Diagnosis not present

## 2023-04-11 DIAGNOSIS — M8008XD Age-related osteoporosis with current pathological fracture, vertebra(e), subsequent encounter for fracture with routine healing: Secondary | ICD-10-CM | POA: Diagnosis not present

## 2023-04-11 DIAGNOSIS — K5790 Diverticulosis of intestine, part unspecified, without perforation or abscess without bleeding: Secondary | ICD-10-CM | POA: Diagnosis not present

## 2023-04-11 DIAGNOSIS — M1990 Unspecified osteoarthritis, unspecified site: Secondary | ICD-10-CM | POA: Diagnosis not present

## 2023-04-11 DIAGNOSIS — Z85038 Personal history of other malignant neoplasm of large intestine: Secondary | ICD-10-CM | POA: Diagnosis not present

## 2023-04-11 DIAGNOSIS — K219 Gastro-esophageal reflux disease without esophagitis: Secondary | ICD-10-CM | POA: Diagnosis not present

## 2023-04-11 DIAGNOSIS — F419 Anxiety disorder, unspecified: Secondary | ICD-10-CM | POA: Diagnosis not present

## 2023-04-11 DIAGNOSIS — Z9089 Acquired absence of other organs: Secondary | ICD-10-CM | POA: Diagnosis not present

## 2023-04-11 DIAGNOSIS — F5101 Primary insomnia: Secondary | ICD-10-CM | POA: Diagnosis not present

## 2023-04-11 DIAGNOSIS — M48061 Spinal stenosis, lumbar region without neurogenic claudication: Secondary | ICD-10-CM | POA: Diagnosis not present

## 2023-04-11 DIAGNOSIS — E7800 Pure hypercholesterolemia, unspecified: Secondary | ICD-10-CM | POA: Diagnosis not present

## 2023-04-11 DIAGNOSIS — H269 Unspecified cataract: Secondary | ICD-10-CM | POA: Diagnosis not present

## 2023-04-11 DIAGNOSIS — L659 Nonscarring hair loss, unspecified: Secondary | ICD-10-CM | POA: Diagnosis not present

## 2023-04-11 DIAGNOSIS — Z9049 Acquired absence of other specified parts of digestive tract: Secondary | ICD-10-CM | POA: Diagnosis not present

## 2023-04-11 DIAGNOSIS — F322 Major depressive disorder, single episode, severe without psychotic features: Secondary | ICD-10-CM | POA: Diagnosis not present

## 2023-04-11 DIAGNOSIS — Z9181 History of falling: Secondary | ICD-10-CM | POA: Diagnosis not present

## 2023-04-12 DIAGNOSIS — K219 Gastro-esophageal reflux disease without esophagitis: Secondary | ICD-10-CM | POA: Diagnosis not present

## 2023-04-12 DIAGNOSIS — M1990 Unspecified osteoarthritis, unspecified site: Secondary | ICD-10-CM | POA: Diagnosis not present

## 2023-04-12 DIAGNOSIS — Z9089 Acquired absence of other organs: Secondary | ICD-10-CM | POA: Diagnosis not present

## 2023-04-12 DIAGNOSIS — F5101 Primary insomnia: Secondary | ICD-10-CM | POA: Diagnosis not present

## 2023-04-12 DIAGNOSIS — Z9049 Acquired absence of other specified parts of digestive tract: Secondary | ICD-10-CM | POA: Diagnosis not present

## 2023-04-12 DIAGNOSIS — L659 Nonscarring hair loss, unspecified: Secondary | ICD-10-CM | POA: Diagnosis not present

## 2023-04-12 DIAGNOSIS — H269 Unspecified cataract: Secondary | ICD-10-CM | POA: Diagnosis not present

## 2023-04-12 DIAGNOSIS — F419 Anxiety disorder, unspecified: Secondary | ICD-10-CM | POA: Diagnosis not present

## 2023-04-12 DIAGNOSIS — M48061 Spinal stenosis, lumbar region without neurogenic claudication: Secondary | ICD-10-CM | POA: Diagnosis not present

## 2023-04-12 DIAGNOSIS — Z85038 Personal history of other malignant neoplasm of large intestine: Secondary | ICD-10-CM | POA: Diagnosis not present

## 2023-04-12 DIAGNOSIS — E7800 Pure hypercholesterolemia, unspecified: Secondary | ICD-10-CM | POA: Diagnosis not present

## 2023-04-12 DIAGNOSIS — Z9181 History of falling: Secondary | ICD-10-CM | POA: Diagnosis not present

## 2023-04-12 DIAGNOSIS — F322 Major depressive disorder, single episode, severe without psychotic features: Secondary | ICD-10-CM | POA: Diagnosis not present

## 2023-04-12 DIAGNOSIS — K5790 Diverticulosis of intestine, part unspecified, without perforation or abscess without bleeding: Secondary | ICD-10-CM | POA: Diagnosis not present

## 2023-04-12 DIAGNOSIS — M8008XD Age-related osteoporosis with current pathological fracture, vertebra(e), subsequent encounter for fracture with routine healing: Secondary | ICD-10-CM | POA: Diagnosis not present

## 2023-04-16 DIAGNOSIS — Z9049 Acquired absence of other specified parts of digestive tract: Secondary | ICD-10-CM | POA: Diagnosis not present

## 2023-04-16 DIAGNOSIS — K5790 Diverticulosis of intestine, part unspecified, without perforation or abscess without bleeding: Secondary | ICD-10-CM | POA: Diagnosis not present

## 2023-04-16 DIAGNOSIS — M8008XD Age-related osteoporosis with current pathological fracture, vertebra(e), subsequent encounter for fracture with routine healing: Secondary | ICD-10-CM | POA: Diagnosis not present

## 2023-04-16 DIAGNOSIS — Z85038 Personal history of other malignant neoplasm of large intestine: Secondary | ICD-10-CM | POA: Diagnosis not present

## 2023-04-16 DIAGNOSIS — Z9181 History of falling: Secondary | ICD-10-CM | POA: Diagnosis not present

## 2023-04-16 DIAGNOSIS — M1990 Unspecified osteoarthritis, unspecified site: Secondary | ICD-10-CM | POA: Diagnosis not present

## 2023-04-16 DIAGNOSIS — F419 Anxiety disorder, unspecified: Secondary | ICD-10-CM | POA: Diagnosis not present

## 2023-04-16 DIAGNOSIS — Z9089 Acquired absence of other organs: Secondary | ICD-10-CM | POA: Diagnosis not present

## 2023-04-16 DIAGNOSIS — K219 Gastro-esophageal reflux disease without esophagitis: Secondary | ICD-10-CM | POA: Diagnosis not present

## 2023-04-16 DIAGNOSIS — E7800 Pure hypercholesterolemia, unspecified: Secondary | ICD-10-CM | POA: Diagnosis not present

## 2023-04-16 DIAGNOSIS — M48061 Spinal stenosis, lumbar region without neurogenic claudication: Secondary | ICD-10-CM | POA: Diagnosis not present

## 2023-04-16 DIAGNOSIS — L659 Nonscarring hair loss, unspecified: Secondary | ICD-10-CM | POA: Diagnosis not present

## 2023-04-16 DIAGNOSIS — F322 Major depressive disorder, single episode, severe without psychotic features: Secondary | ICD-10-CM | POA: Diagnosis not present

## 2023-04-16 DIAGNOSIS — F5101 Primary insomnia: Secondary | ICD-10-CM | POA: Diagnosis not present

## 2023-04-16 DIAGNOSIS — H269 Unspecified cataract: Secondary | ICD-10-CM | POA: Diagnosis not present

## 2023-04-18 DIAGNOSIS — M791 Myalgia, unspecified site: Secondary | ICD-10-CM | POA: Diagnosis not present

## 2023-04-20 DIAGNOSIS — K219 Gastro-esophageal reflux disease without esophagitis: Secondary | ICD-10-CM | POA: Diagnosis not present

## 2023-04-20 DIAGNOSIS — K5790 Diverticulosis of intestine, part unspecified, without perforation or abscess without bleeding: Secondary | ICD-10-CM | POA: Diagnosis not present

## 2023-04-20 DIAGNOSIS — M1990 Unspecified osteoarthritis, unspecified site: Secondary | ICD-10-CM | POA: Diagnosis not present

## 2023-04-20 DIAGNOSIS — M48061 Spinal stenosis, lumbar region without neurogenic claudication: Secondary | ICD-10-CM | POA: Diagnosis not present

## 2023-04-20 DIAGNOSIS — E7800 Pure hypercholesterolemia, unspecified: Secondary | ICD-10-CM | POA: Diagnosis not present

## 2023-04-20 DIAGNOSIS — F419 Anxiety disorder, unspecified: Secondary | ICD-10-CM | POA: Diagnosis not present

## 2023-04-20 DIAGNOSIS — F5101 Primary insomnia: Secondary | ICD-10-CM | POA: Diagnosis not present

## 2023-04-20 DIAGNOSIS — Z9181 History of falling: Secondary | ICD-10-CM | POA: Diagnosis not present

## 2023-04-20 DIAGNOSIS — H269 Unspecified cataract: Secondary | ICD-10-CM | POA: Diagnosis not present

## 2023-04-20 DIAGNOSIS — Z85038 Personal history of other malignant neoplasm of large intestine: Secondary | ICD-10-CM | POA: Diagnosis not present

## 2023-04-20 DIAGNOSIS — Z9049 Acquired absence of other specified parts of digestive tract: Secondary | ICD-10-CM | POA: Diagnosis not present

## 2023-04-20 DIAGNOSIS — L659 Nonscarring hair loss, unspecified: Secondary | ICD-10-CM | POA: Diagnosis not present

## 2023-04-20 DIAGNOSIS — M8008XD Age-related osteoporosis with current pathological fracture, vertebra(e), subsequent encounter for fracture with routine healing: Secondary | ICD-10-CM | POA: Diagnosis not present

## 2023-04-20 DIAGNOSIS — F322 Major depressive disorder, single episode, severe without psychotic features: Secondary | ICD-10-CM | POA: Diagnosis not present

## 2023-04-20 DIAGNOSIS — Z9089 Acquired absence of other organs: Secondary | ICD-10-CM | POA: Diagnosis not present

## 2023-04-22 DIAGNOSIS — Z9181 History of falling: Secondary | ICD-10-CM | POA: Diagnosis not present

## 2023-04-22 DIAGNOSIS — E7800 Pure hypercholesterolemia, unspecified: Secondary | ICD-10-CM | POA: Diagnosis not present

## 2023-04-22 DIAGNOSIS — M48061 Spinal stenosis, lumbar region without neurogenic claudication: Secondary | ICD-10-CM | POA: Diagnosis not present

## 2023-04-22 DIAGNOSIS — Z9089 Acquired absence of other organs: Secondary | ICD-10-CM | POA: Diagnosis not present

## 2023-04-22 DIAGNOSIS — Z9049 Acquired absence of other specified parts of digestive tract: Secondary | ICD-10-CM | POA: Diagnosis not present

## 2023-04-22 DIAGNOSIS — M8008XD Age-related osteoporosis with current pathological fracture, vertebra(e), subsequent encounter for fracture with routine healing: Secondary | ICD-10-CM | POA: Diagnosis not present

## 2023-04-22 DIAGNOSIS — F5101 Primary insomnia: Secondary | ICD-10-CM | POA: Diagnosis not present

## 2023-04-22 DIAGNOSIS — K5790 Diverticulosis of intestine, part unspecified, without perforation or abscess without bleeding: Secondary | ICD-10-CM | POA: Diagnosis not present

## 2023-04-22 DIAGNOSIS — H269 Unspecified cataract: Secondary | ICD-10-CM | POA: Diagnosis not present

## 2023-04-22 DIAGNOSIS — Z85038 Personal history of other malignant neoplasm of large intestine: Secondary | ICD-10-CM | POA: Diagnosis not present

## 2023-04-22 DIAGNOSIS — F419 Anxiety disorder, unspecified: Secondary | ICD-10-CM | POA: Diagnosis not present

## 2023-04-22 DIAGNOSIS — L659 Nonscarring hair loss, unspecified: Secondary | ICD-10-CM | POA: Diagnosis not present

## 2023-04-22 DIAGNOSIS — K219 Gastro-esophageal reflux disease without esophagitis: Secondary | ICD-10-CM | POA: Diagnosis not present

## 2023-04-22 DIAGNOSIS — F322 Major depressive disorder, single episode, severe without psychotic features: Secondary | ICD-10-CM | POA: Diagnosis not present

## 2023-04-22 DIAGNOSIS — M1990 Unspecified osteoarthritis, unspecified site: Secondary | ICD-10-CM | POA: Diagnosis not present

## 2023-04-25 DIAGNOSIS — M48061 Spinal stenosis, lumbar region without neurogenic claudication: Secondary | ICD-10-CM | POA: Diagnosis not present

## 2023-04-25 DIAGNOSIS — Z9049 Acquired absence of other specified parts of digestive tract: Secondary | ICD-10-CM | POA: Diagnosis not present

## 2023-04-25 DIAGNOSIS — L659 Nonscarring hair loss, unspecified: Secondary | ICD-10-CM | POA: Diagnosis not present

## 2023-04-25 DIAGNOSIS — H269 Unspecified cataract: Secondary | ICD-10-CM | POA: Diagnosis not present

## 2023-04-25 DIAGNOSIS — F5101 Primary insomnia: Secondary | ICD-10-CM | POA: Diagnosis not present

## 2023-04-25 DIAGNOSIS — F322 Major depressive disorder, single episode, severe without psychotic features: Secondary | ICD-10-CM | POA: Diagnosis not present

## 2023-04-25 DIAGNOSIS — Z85038 Personal history of other malignant neoplasm of large intestine: Secondary | ICD-10-CM | POA: Diagnosis not present

## 2023-04-25 DIAGNOSIS — Z9181 History of falling: Secondary | ICD-10-CM | POA: Diagnosis not present

## 2023-04-25 DIAGNOSIS — M1990 Unspecified osteoarthritis, unspecified site: Secondary | ICD-10-CM | POA: Diagnosis not present

## 2023-04-25 DIAGNOSIS — F419 Anxiety disorder, unspecified: Secondary | ICD-10-CM | POA: Diagnosis not present

## 2023-04-25 DIAGNOSIS — K219 Gastro-esophageal reflux disease without esophagitis: Secondary | ICD-10-CM | POA: Diagnosis not present

## 2023-04-25 DIAGNOSIS — E7800 Pure hypercholesterolemia, unspecified: Secondary | ICD-10-CM | POA: Diagnosis not present

## 2023-04-25 DIAGNOSIS — M8008XD Age-related osteoporosis with current pathological fracture, vertebra(e), subsequent encounter for fracture with routine healing: Secondary | ICD-10-CM | POA: Diagnosis not present

## 2023-04-25 DIAGNOSIS — K5790 Diverticulosis of intestine, part unspecified, without perforation or abscess without bleeding: Secondary | ICD-10-CM | POA: Diagnosis not present

## 2023-04-25 DIAGNOSIS — Z9089 Acquired absence of other organs: Secondary | ICD-10-CM | POA: Diagnosis not present

## 2023-04-26 DIAGNOSIS — M7061 Trochanteric bursitis, right hip: Secondary | ICD-10-CM | POA: Diagnosis not present

## 2023-04-26 DIAGNOSIS — M7062 Trochanteric bursitis, left hip: Secondary | ICD-10-CM | POA: Diagnosis not present

## 2023-05-02 DIAGNOSIS — M48061 Spinal stenosis, lumbar region without neurogenic claudication: Secondary | ICD-10-CM | POA: Diagnosis not present

## 2023-05-02 DIAGNOSIS — M1990 Unspecified osteoarthritis, unspecified site: Secondary | ICD-10-CM | POA: Diagnosis not present

## 2023-05-02 DIAGNOSIS — Z85038 Personal history of other malignant neoplasm of large intestine: Secondary | ICD-10-CM | POA: Diagnosis not present

## 2023-05-02 DIAGNOSIS — Z9181 History of falling: Secondary | ICD-10-CM | POA: Diagnosis not present

## 2023-05-02 DIAGNOSIS — Z9089 Acquired absence of other organs: Secondary | ICD-10-CM | POA: Diagnosis not present

## 2023-05-02 DIAGNOSIS — M8008XD Age-related osteoporosis with current pathological fracture, vertebra(e), subsequent encounter for fracture with routine healing: Secondary | ICD-10-CM | POA: Diagnosis not present

## 2023-05-02 DIAGNOSIS — L659 Nonscarring hair loss, unspecified: Secondary | ICD-10-CM | POA: Diagnosis not present

## 2023-05-02 DIAGNOSIS — H269 Unspecified cataract: Secondary | ICD-10-CM | POA: Diagnosis not present

## 2023-05-02 DIAGNOSIS — E7800 Pure hypercholesterolemia, unspecified: Secondary | ICD-10-CM | POA: Diagnosis not present

## 2023-05-02 DIAGNOSIS — Z9049 Acquired absence of other specified parts of digestive tract: Secondary | ICD-10-CM | POA: Diagnosis not present

## 2023-05-02 DIAGNOSIS — K5790 Diverticulosis of intestine, part unspecified, without perforation or abscess without bleeding: Secondary | ICD-10-CM | POA: Diagnosis not present

## 2023-05-02 DIAGNOSIS — F5101 Primary insomnia: Secondary | ICD-10-CM | POA: Diagnosis not present

## 2023-05-02 DIAGNOSIS — F419 Anxiety disorder, unspecified: Secondary | ICD-10-CM | POA: Diagnosis not present

## 2023-05-02 DIAGNOSIS — K219 Gastro-esophageal reflux disease without esophagitis: Secondary | ICD-10-CM | POA: Diagnosis not present

## 2023-05-02 DIAGNOSIS — F322 Major depressive disorder, single episode, severe without psychotic features: Secondary | ICD-10-CM | POA: Diagnosis not present

## 2023-05-09 DIAGNOSIS — F322 Major depressive disorder, single episode, severe without psychotic features: Secondary | ICD-10-CM | POA: Diagnosis not present

## 2023-05-09 DIAGNOSIS — L659 Nonscarring hair loss, unspecified: Secondary | ICD-10-CM | POA: Diagnosis not present

## 2023-05-09 DIAGNOSIS — F419 Anxiety disorder, unspecified: Secondary | ICD-10-CM | POA: Diagnosis not present

## 2023-05-09 DIAGNOSIS — F5101 Primary insomnia: Secondary | ICD-10-CM | POA: Diagnosis not present

## 2023-05-09 DIAGNOSIS — E7800 Pure hypercholesterolemia, unspecified: Secondary | ICD-10-CM | POA: Diagnosis not present

## 2023-05-09 DIAGNOSIS — Z9049 Acquired absence of other specified parts of digestive tract: Secondary | ICD-10-CM | POA: Diagnosis not present

## 2023-05-09 DIAGNOSIS — Z9181 History of falling: Secondary | ICD-10-CM | POA: Diagnosis not present

## 2023-05-09 DIAGNOSIS — K5790 Diverticulosis of intestine, part unspecified, without perforation or abscess without bleeding: Secondary | ICD-10-CM | POA: Diagnosis not present

## 2023-05-09 DIAGNOSIS — H269 Unspecified cataract: Secondary | ICD-10-CM | POA: Diagnosis not present

## 2023-05-09 DIAGNOSIS — M8008XD Age-related osteoporosis with current pathological fracture, vertebra(e), subsequent encounter for fracture with routine healing: Secondary | ICD-10-CM | POA: Diagnosis not present

## 2023-05-09 DIAGNOSIS — M1990 Unspecified osteoarthritis, unspecified site: Secondary | ICD-10-CM | POA: Diagnosis not present

## 2023-05-09 DIAGNOSIS — M48061 Spinal stenosis, lumbar region without neurogenic claudication: Secondary | ICD-10-CM | POA: Diagnosis not present

## 2023-05-09 DIAGNOSIS — K219 Gastro-esophageal reflux disease without esophagitis: Secondary | ICD-10-CM | POA: Diagnosis not present

## 2023-05-09 DIAGNOSIS — Z9089 Acquired absence of other organs: Secondary | ICD-10-CM | POA: Diagnosis not present

## 2023-05-09 DIAGNOSIS — Z85038 Personal history of other malignant neoplasm of large intestine: Secondary | ICD-10-CM | POA: Diagnosis not present

## 2023-05-10 DIAGNOSIS — K219 Gastro-esophageal reflux disease without esophagitis: Secondary | ICD-10-CM | POA: Diagnosis not present

## 2023-05-10 DIAGNOSIS — E7800 Pure hypercholesterolemia, unspecified: Secondary | ICD-10-CM | POA: Diagnosis not present

## 2023-05-10 DIAGNOSIS — F322 Major depressive disorder, single episode, severe without psychotic features: Secondary | ICD-10-CM | POA: Diagnosis not present

## 2023-05-10 DIAGNOSIS — Z9181 History of falling: Secondary | ICD-10-CM | POA: Diagnosis not present

## 2023-05-10 DIAGNOSIS — H269 Unspecified cataract: Secondary | ICD-10-CM | POA: Diagnosis not present

## 2023-05-10 DIAGNOSIS — F419 Anxiety disorder, unspecified: Secondary | ICD-10-CM | POA: Diagnosis not present

## 2023-05-10 DIAGNOSIS — L659 Nonscarring hair loss, unspecified: Secondary | ICD-10-CM | POA: Diagnosis not present

## 2023-05-10 DIAGNOSIS — Z9049 Acquired absence of other specified parts of digestive tract: Secondary | ICD-10-CM | POA: Diagnosis not present

## 2023-05-10 DIAGNOSIS — F5101 Primary insomnia: Secondary | ICD-10-CM | POA: Diagnosis not present

## 2023-05-10 DIAGNOSIS — M1990 Unspecified osteoarthritis, unspecified site: Secondary | ICD-10-CM | POA: Diagnosis not present

## 2023-05-10 DIAGNOSIS — Z85038 Personal history of other malignant neoplasm of large intestine: Secondary | ICD-10-CM | POA: Diagnosis not present

## 2023-05-10 DIAGNOSIS — K5790 Diverticulosis of intestine, part unspecified, without perforation or abscess without bleeding: Secondary | ICD-10-CM | POA: Diagnosis not present

## 2023-05-10 DIAGNOSIS — M48061 Spinal stenosis, lumbar region without neurogenic claudication: Secondary | ICD-10-CM | POA: Diagnosis not present

## 2023-05-10 DIAGNOSIS — M8008XD Age-related osteoporosis with current pathological fracture, vertebra(e), subsequent encounter for fracture with routine healing: Secondary | ICD-10-CM | POA: Diagnosis not present

## 2023-05-10 DIAGNOSIS — Z9089 Acquired absence of other organs: Secondary | ICD-10-CM | POA: Diagnosis not present

## 2023-05-13 DIAGNOSIS — Z9089 Acquired absence of other organs: Secondary | ICD-10-CM | POA: Diagnosis not present

## 2023-05-13 DIAGNOSIS — K219 Gastro-esophageal reflux disease without esophagitis: Secondary | ICD-10-CM | POA: Diagnosis not present

## 2023-05-13 DIAGNOSIS — E7800 Pure hypercholesterolemia, unspecified: Secondary | ICD-10-CM | POA: Diagnosis not present

## 2023-05-13 DIAGNOSIS — M48061 Spinal stenosis, lumbar region without neurogenic claudication: Secondary | ICD-10-CM | POA: Diagnosis not present

## 2023-05-13 DIAGNOSIS — L659 Nonscarring hair loss, unspecified: Secondary | ICD-10-CM | POA: Diagnosis not present

## 2023-05-13 DIAGNOSIS — Z85038 Personal history of other malignant neoplasm of large intestine: Secondary | ICD-10-CM | POA: Diagnosis not present

## 2023-05-13 DIAGNOSIS — Z9181 History of falling: Secondary | ICD-10-CM | POA: Diagnosis not present

## 2023-05-13 DIAGNOSIS — Z9049 Acquired absence of other specified parts of digestive tract: Secondary | ICD-10-CM | POA: Diagnosis not present

## 2023-05-13 DIAGNOSIS — K5790 Diverticulosis of intestine, part unspecified, without perforation or abscess without bleeding: Secondary | ICD-10-CM | POA: Diagnosis not present

## 2023-05-13 DIAGNOSIS — F322 Major depressive disorder, single episode, severe without psychotic features: Secondary | ICD-10-CM | POA: Diagnosis not present

## 2023-05-13 DIAGNOSIS — M1990 Unspecified osteoarthritis, unspecified site: Secondary | ICD-10-CM | POA: Diagnosis not present

## 2023-05-13 DIAGNOSIS — F419 Anxiety disorder, unspecified: Secondary | ICD-10-CM | POA: Diagnosis not present

## 2023-05-13 DIAGNOSIS — H269 Unspecified cataract: Secondary | ICD-10-CM | POA: Diagnosis not present

## 2023-05-13 DIAGNOSIS — M8008XD Age-related osteoporosis with current pathological fracture, vertebra(e), subsequent encounter for fracture with routine healing: Secondary | ICD-10-CM | POA: Diagnosis not present

## 2023-05-13 DIAGNOSIS — F5101 Primary insomnia: Secondary | ICD-10-CM | POA: Diagnosis not present

## 2023-05-15 DIAGNOSIS — M545 Low back pain, unspecified: Secondary | ICD-10-CM | POA: Diagnosis not present

## 2023-05-15 DIAGNOSIS — M47816 Spondylosis without myelopathy or radiculopathy, lumbar region: Secondary | ICD-10-CM | POA: Diagnosis not present

## 2023-05-17 DIAGNOSIS — K5790 Diverticulosis of intestine, part unspecified, without perforation or abscess without bleeding: Secondary | ICD-10-CM | POA: Diagnosis not present

## 2023-05-17 DIAGNOSIS — M8008XD Age-related osteoporosis with current pathological fracture, vertebra(e), subsequent encounter for fracture with routine healing: Secondary | ICD-10-CM | POA: Diagnosis not present

## 2023-05-17 DIAGNOSIS — E7800 Pure hypercholesterolemia, unspecified: Secondary | ICD-10-CM | POA: Diagnosis not present

## 2023-05-17 DIAGNOSIS — Z9181 History of falling: Secondary | ICD-10-CM | POA: Diagnosis not present

## 2023-05-17 DIAGNOSIS — H269 Unspecified cataract: Secondary | ICD-10-CM | POA: Diagnosis not present

## 2023-05-17 DIAGNOSIS — F419 Anxiety disorder, unspecified: Secondary | ICD-10-CM | POA: Diagnosis not present

## 2023-05-17 DIAGNOSIS — Z9049 Acquired absence of other specified parts of digestive tract: Secondary | ICD-10-CM | POA: Diagnosis not present

## 2023-05-17 DIAGNOSIS — M48061 Spinal stenosis, lumbar region without neurogenic claudication: Secondary | ICD-10-CM | POA: Diagnosis not present

## 2023-05-17 DIAGNOSIS — L659 Nonscarring hair loss, unspecified: Secondary | ICD-10-CM | POA: Diagnosis not present

## 2023-05-17 DIAGNOSIS — F5101 Primary insomnia: Secondary | ICD-10-CM | POA: Diagnosis not present

## 2023-05-17 DIAGNOSIS — K219 Gastro-esophageal reflux disease without esophagitis: Secondary | ICD-10-CM | POA: Diagnosis not present

## 2023-05-17 DIAGNOSIS — Z9089 Acquired absence of other organs: Secondary | ICD-10-CM | POA: Diagnosis not present

## 2023-05-17 DIAGNOSIS — M1990 Unspecified osteoarthritis, unspecified site: Secondary | ICD-10-CM | POA: Diagnosis not present

## 2023-05-17 DIAGNOSIS — F322 Major depressive disorder, single episode, severe without psychotic features: Secondary | ICD-10-CM | POA: Diagnosis not present

## 2023-05-17 DIAGNOSIS — Z85038 Personal history of other malignant neoplasm of large intestine: Secondary | ICD-10-CM | POA: Diagnosis not present

## 2023-05-23 DIAGNOSIS — Z9089 Acquired absence of other organs: Secondary | ICD-10-CM | POA: Diagnosis not present

## 2023-05-23 DIAGNOSIS — M8008XD Age-related osteoporosis with current pathological fracture, vertebra(e), subsequent encounter for fracture with routine healing: Secondary | ICD-10-CM | POA: Diagnosis not present

## 2023-05-23 DIAGNOSIS — E7800 Pure hypercholesterolemia, unspecified: Secondary | ICD-10-CM | POA: Diagnosis not present

## 2023-05-23 DIAGNOSIS — F5101 Primary insomnia: Secondary | ICD-10-CM | POA: Diagnosis not present

## 2023-05-23 DIAGNOSIS — K5790 Diverticulosis of intestine, part unspecified, without perforation or abscess without bleeding: Secondary | ICD-10-CM | POA: Diagnosis not present

## 2023-05-23 DIAGNOSIS — K219 Gastro-esophageal reflux disease without esophagitis: Secondary | ICD-10-CM | POA: Diagnosis not present

## 2023-05-23 DIAGNOSIS — M48061 Spinal stenosis, lumbar region without neurogenic claudication: Secondary | ICD-10-CM | POA: Diagnosis not present

## 2023-05-23 DIAGNOSIS — L659 Nonscarring hair loss, unspecified: Secondary | ICD-10-CM | POA: Diagnosis not present

## 2023-05-23 DIAGNOSIS — M1990 Unspecified osteoarthritis, unspecified site: Secondary | ICD-10-CM | POA: Diagnosis not present

## 2023-05-23 DIAGNOSIS — Z9181 History of falling: Secondary | ICD-10-CM | POA: Diagnosis not present

## 2023-05-23 DIAGNOSIS — F419 Anxiety disorder, unspecified: Secondary | ICD-10-CM | POA: Diagnosis not present

## 2023-05-23 DIAGNOSIS — Z9049 Acquired absence of other specified parts of digestive tract: Secondary | ICD-10-CM | POA: Diagnosis not present

## 2023-05-23 DIAGNOSIS — H269 Unspecified cataract: Secondary | ICD-10-CM | POA: Diagnosis not present

## 2023-05-23 DIAGNOSIS — F322 Major depressive disorder, single episode, severe without psychotic features: Secondary | ICD-10-CM | POA: Diagnosis not present

## 2023-05-23 DIAGNOSIS — Z85038 Personal history of other malignant neoplasm of large intestine: Secondary | ICD-10-CM | POA: Diagnosis not present

## 2023-05-24 DIAGNOSIS — M48061 Spinal stenosis, lumbar region without neurogenic claudication: Secondary | ICD-10-CM | POA: Diagnosis not present

## 2023-05-24 DIAGNOSIS — F322 Major depressive disorder, single episode, severe without psychotic features: Secondary | ICD-10-CM | POA: Diagnosis not present

## 2023-05-24 DIAGNOSIS — L659 Nonscarring hair loss, unspecified: Secondary | ICD-10-CM | POA: Diagnosis not present

## 2023-05-24 DIAGNOSIS — Z85038 Personal history of other malignant neoplasm of large intestine: Secondary | ICD-10-CM | POA: Diagnosis not present

## 2023-05-24 DIAGNOSIS — Z9089 Acquired absence of other organs: Secondary | ICD-10-CM | POA: Diagnosis not present

## 2023-05-24 DIAGNOSIS — F5101 Primary insomnia: Secondary | ICD-10-CM | POA: Diagnosis not present

## 2023-05-24 DIAGNOSIS — E7800 Pure hypercholesterolemia, unspecified: Secondary | ICD-10-CM | POA: Diagnosis not present

## 2023-05-24 DIAGNOSIS — Z9049 Acquired absence of other specified parts of digestive tract: Secondary | ICD-10-CM | POA: Diagnosis not present

## 2023-05-24 DIAGNOSIS — Z9181 History of falling: Secondary | ICD-10-CM | POA: Diagnosis not present

## 2023-05-24 DIAGNOSIS — H269 Unspecified cataract: Secondary | ICD-10-CM | POA: Diagnosis not present

## 2023-05-24 DIAGNOSIS — M1990 Unspecified osteoarthritis, unspecified site: Secondary | ICD-10-CM | POA: Diagnosis not present

## 2023-05-24 DIAGNOSIS — K219 Gastro-esophageal reflux disease without esophagitis: Secondary | ICD-10-CM | POA: Diagnosis not present

## 2023-05-24 DIAGNOSIS — K5790 Diverticulosis of intestine, part unspecified, without perforation or abscess without bleeding: Secondary | ICD-10-CM | POA: Diagnosis not present

## 2023-05-24 DIAGNOSIS — M8008XD Age-related osteoporosis with current pathological fracture, vertebra(e), subsequent encounter for fracture with routine healing: Secondary | ICD-10-CM | POA: Diagnosis not present

## 2023-05-24 DIAGNOSIS — F419 Anxiety disorder, unspecified: Secondary | ICD-10-CM | POA: Diagnosis not present

## 2023-05-28 DIAGNOSIS — M8008XD Age-related osteoporosis with current pathological fracture, vertebra(e), subsequent encounter for fracture with routine healing: Secondary | ICD-10-CM | POA: Diagnosis not present

## 2023-05-28 DIAGNOSIS — F322 Major depressive disorder, single episode, severe without psychotic features: Secondary | ICD-10-CM | POA: Diagnosis not present

## 2023-05-28 DIAGNOSIS — M7071 Other bursitis of hip, right hip: Secondary | ICD-10-CM | POA: Diagnosis not present

## 2023-05-28 DIAGNOSIS — M1611 Unilateral primary osteoarthritis, right hip: Secondary | ICD-10-CM | POA: Diagnosis not present

## 2023-05-29 DIAGNOSIS — Z85038 Personal history of other malignant neoplasm of large intestine: Secondary | ICD-10-CM | POA: Diagnosis not present

## 2023-05-29 DIAGNOSIS — L659 Nonscarring hair loss, unspecified: Secondary | ICD-10-CM | POA: Diagnosis not present

## 2023-05-29 DIAGNOSIS — Z9181 History of falling: Secondary | ICD-10-CM | POA: Diagnosis not present

## 2023-05-29 DIAGNOSIS — M48061 Spinal stenosis, lumbar region without neurogenic claudication: Secondary | ICD-10-CM | POA: Diagnosis not present

## 2023-05-29 DIAGNOSIS — H269 Unspecified cataract: Secondary | ICD-10-CM | POA: Diagnosis not present

## 2023-05-29 DIAGNOSIS — M7071 Other bursitis of hip, right hip: Secondary | ICD-10-CM | POA: Diagnosis not present

## 2023-05-29 DIAGNOSIS — K5790 Diverticulosis of intestine, part unspecified, without perforation or abscess without bleeding: Secondary | ICD-10-CM | POA: Diagnosis not present

## 2023-05-29 DIAGNOSIS — K219 Gastro-esophageal reflux disease without esophagitis: Secondary | ICD-10-CM | POA: Diagnosis not present

## 2023-05-29 DIAGNOSIS — Z9049 Acquired absence of other specified parts of digestive tract: Secondary | ICD-10-CM | POA: Diagnosis not present

## 2023-05-29 DIAGNOSIS — M1611 Unilateral primary osteoarthritis, right hip: Secondary | ICD-10-CM | POA: Diagnosis not present

## 2023-05-29 DIAGNOSIS — F322 Major depressive disorder, single episode, severe without psychotic features: Secondary | ICD-10-CM | POA: Diagnosis not present

## 2023-05-29 DIAGNOSIS — E7800 Pure hypercholesterolemia, unspecified: Secondary | ICD-10-CM | POA: Diagnosis not present

## 2023-05-29 DIAGNOSIS — F5101 Primary insomnia: Secondary | ICD-10-CM | POA: Diagnosis not present

## 2023-05-29 DIAGNOSIS — M8008XD Age-related osteoporosis with current pathological fracture, vertebra(e), subsequent encounter for fracture with routine healing: Secondary | ICD-10-CM | POA: Diagnosis not present

## 2023-05-29 DIAGNOSIS — F419 Anxiety disorder, unspecified: Secondary | ICD-10-CM | POA: Diagnosis not present

## 2023-05-29 DIAGNOSIS — Z9089 Acquired absence of other organs: Secondary | ICD-10-CM | POA: Diagnosis not present

## 2023-06-06 DIAGNOSIS — F419 Anxiety disorder, unspecified: Secondary | ICD-10-CM | POA: Diagnosis not present

## 2023-06-06 DIAGNOSIS — E7800 Pure hypercholesterolemia, unspecified: Secondary | ICD-10-CM | POA: Diagnosis not present

## 2023-06-06 DIAGNOSIS — K5790 Diverticulosis of intestine, part unspecified, without perforation or abscess without bleeding: Secondary | ICD-10-CM | POA: Diagnosis not present

## 2023-06-06 DIAGNOSIS — M48061 Spinal stenosis, lumbar region without neurogenic claudication: Secondary | ICD-10-CM | POA: Diagnosis not present

## 2023-06-06 DIAGNOSIS — F5101 Primary insomnia: Secondary | ICD-10-CM | POA: Diagnosis not present

## 2023-06-06 DIAGNOSIS — K219 Gastro-esophageal reflux disease without esophagitis: Secondary | ICD-10-CM | POA: Diagnosis not present

## 2023-06-06 DIAGNOSIS — Z9181 History of falling: Secondary | ICD-10-CM | POA: Diagnosis not present

## 2023-06-06 DIAGNOSIS — F322 Major depressive disorder, single episode, severe without psychotic features: Secondary | ICD-10-CM | POA: Diagnosis not present

## 2023-06-06 DIAGNOSIS — M7071 Other bursitis of hip, right hip: Secondary | ICD-10-CM | POA: Diagnosis not present

## 2023-06-06 DIAGNOSIS — Z9049 Acquired absence of other specified parts of digestive tract: Secondary | ICD-10-CM | POA: Diagnosis not present

## 2023-06-06 DIAGNOSIS — L659 Nonscarring hair loss, unspecified: Secondary | ICD-10-CM | POA: Diagnosis not present

## 2023-06-06 DIAGNOSIS — M1611 Unilateral primary osteoarthritis, right hip: Secondary | ICD-10-CM | POA: Diagnosis not present

## 2023-06-06 DIAGNOSIS — M8008XD Age-related osteoporosis with current pathological fracture, vertebra(e), subsequent encounter for fracture with routine healing: Secondary | ICD-10-CM | POA: Diagnosis not present

## 2023-06-06 DIAGNOSIS — Z85038 Personal history of other malignant neoplasm of large intestine: Secondary | ICD-10-CM | POA: Diagnosis not present

## 2023-06-06 DIAGNOSIS — H269 Unspecified cataract: Secondary | ICD-10-CM | POA: Diagnosis not present

## 2023-06-06 DIAGNOSIS — Z9089 Acquired absence of other organs: Secondary | ICD-10-CM | POA: Diagnosis not present

## 2023-06-07 DIAGNOSIS — F322 Major depressive disorder, single episode, severe without psychotic features: Secondary | ICD-10-CM | POA: Diagnosis not present

## 2023-06-07 DIAGNOSIS — F419 Anxiety disorder, unspecified: Secondary | ICD-10-CM | POA: Diagnosis not present

## 2023-06-07 DIAGNOSIS — M8008XD Age-related osteoporosis with current pathological fracture, vertebra(e), subsequent encounter for fracture with routine healing: Secondary | ICD-10-CM | POA: Diagnosis not present

## 2023-06-07 DIAGNOSIS — M1611 Unilateral primary osteoarthritis, right hip: Secondary | ICD-10-CM | POA: Diagnosis not present

## 2023-06-07 DIAGNOSIS — Z9181 History of falling: Secondary | ICD-10-CM | POA: Diagnosis not present

## 2023-06-07 DIAGNOSIS — K5790 Diverticulosis of intestine, part unspecified, without perforation or abscess without bleeding: Secondary | ICD-10-CM | POA: Diagnosis not present

## 2023-06-07 DIAGNOSIS — E7800 Pure hypercholesterolemia, unspecified: Secondary | ICD-10-CM | POA: Diagnosis not present

## 2023-06-07 DIAGNOSIS — Z85038 Personal history of other malignant neoplasm of large intestine: Secondary | ICD-10-CM | POA: Diagnosis not present

## 2023-06-07 DIAGNOSIS — Z9089 Acquired absence of other organs: Secondary | ICD-10-CM | POA: Diagnosis not present

## 2023-06-07 DIAGNOSIS — H269 Unspecified cataract: Secondary | ICD-10-CM | POA: Diagnosis not present

## 2023-06-07 DIAGNOSIS — M7071 Other bursitis of hip, right hip: Secondary | ICD-10-CM | POA: Diagnosis not present

## 2023-06-07 DIAGNOSIS — L659 Nonscarring hair loss, unspecified: Secondary | ICD-10-CM | POA: Diagnosis not present

## 2023-06-07 DIAGNOSIS — Z9049 Acquired absence of other specified parts of digestive tract: Secondary | ICD-10-CM | POA: Diagnosis not present

## 2023-06-07 DIAGNOSIS — F5101 Primary insomnia: Secondary | ICD-10-CM | POA: Diagnosis not present

## 2023-06-07 DIAGNOSIS — K219 Gastro-esophageal reflux disease without esophagitis: Secondary | ICD-10-CM | POA: Diagnosis not present

## 2023-06-07 DIAGNOSIS — M48061 Spinal stenosis, lumbar region without neurogenic claudication: Secondary | ICD-10-CM | POA: Diagnosis not present

## 2023-06-12 DIAGNOSIS — E7800 Pure hypercholesterolemia, unspecified: Secondary | ICD-10-CM | POA: Diagnosis not present

## 2023-06-12 DIAGNOSIS — K219 Gastro-esophageal reflux disease without esophagitis: Secondary | ICD-10-CM | POA: Diagnosis not present

## 2023-06-12 DIAGNOSIS — H269 Unspecified cataract: Secondary | ICD-10-CM | POA: Diagnosis not present

## 2023-06-12 DIAGNOSIS — Z85038 Personal history of other malignant neoplasm of large intestine: Secondary | ICD-10-CM | POA: Diagnosis not present

## 2023-06-12 DIAGNOSIS — M7071 Other bursitis of hip, right hip: Secondary | ICD-10-CM | POA: Diagnosis not present

## 2023-06-12 DIAGNOSIS — M48061 Spinal stenosis, lumbar region without neurogenic claudication: Secondary | ICD-10-CM | POA: Diagnosis not present

## 2023-06-12 DIAGNOSIS — Z9089 Acquired absence of other organs: Secondary | ICD-10-CM | POA: Diagnosis not present

## 2023-06-12 DIAGNOSIS — M1611 Unilateral primary osteoarthritis, right hip: Secondary | ICD-10-CM | POA: Diagnosis not present

## 2023-06-12 DIAGNOSIS — Z9181 History of falling: Secondary | ICD-10-CM | POA: Diagnosis not present

## 2023-06-12 DIAGNOSIS — K5790 Diverticulosis of intestine, part unspecified, without perforation or abscess without bleeding: Secondary | ICD-10-CM | POA: Diagnosis not present

## 2023-06-12 DIAGNOSIS — Z9049 Acquired absence of other specified parts of digestive tract: Secondary | ICD-10-CM | POA: Diagnosis not present

## 2023-06-12 DIAGNOSIS — F322 Major depressive disorder, single episode, severe without psychotic features: Secondary | ICD-10-CM | POA: Diagnosis not present

## 2023-06-12 DIAGNOSIS — L659 Nonscarring hair loss, unspecified: Secondary | ICD-10-CM | POA: Diagnosis not present

## 2023-06-12 DIAGNOSIS — M8008XD Age-related osteoporosis with current pathological fracture, vertebra(e), subsequent encounter for fracture with routine healing: Secondary | ICD-10-CM | POA: Diagnosis not present

## 2023-06-12 DIAGNOSIS — F5101 Primary insomnia: Secondary | ICD-10-CM | POA: Diagnosis not present

## 2023-06-12 DIAGNOSIS — F419 Anxiety disorder, unspecified: Secondary | ICD-10-CM | POA: Diagnosis not present

## 2023-06-13 DIAGNOSIS — K5790 Diverticulosis of intestine, part unspecified, without perforation or abscess without bleeding: Secondary | ICD-10-CM | POA: Diagnosis not present

## 2023-06-13 DIAGNOSIS — M1611 Unilateral primary osteoarthritis, right hip: Secondary | ICD-10-CM | POA: Diagnosis not present

## 2023-06-13 DIAGNOSIS — F5101 Primary insomnia: Secondary | ICD-10-CM | POA: Diagnosis not present

## 2023-06-13 DIAGNOSIS — M8008XD Age-related osteoporosis with current pathological fracture, vertebra(e), subsequent encounter for fracture with routine healing: Secondary | ICD-10-CM | POA: Diagnosis not present

## 2023-06-13 DIAGNOSIS — K219 Gastro-esophageal reflux disease without esophagitis: Secondary | ICD-10-CM | POA: Diagnosis not present

## 2023-06-13 DIAGNOSIS — E7800 Pure hypercholesterolemia, unspecified: Secondary | ICD-10-CM | POA: Diagnosis not present

## 2023-06-13 DIAGNOSIS — M7071 Other bursitis of hip, right hip: Secondary | ICD-10-CM | POA: Diagnosis not present

## 2023-06-13 DIAGNOSIS — F322 Major depressive disorder, single episode, severe without psychotic features: Secondary | ICD-10-CM | POA: Diagnosis not present

## 2023-06-13 DIAGNOSIS — Z9181 History of falling: Secondary | ICD-10-CM | POA: Diagnosis not present

## 2023-06-13 DIAGNOSIS — Z85038 Personal history of other malignant neoplasm of large intestine: Secondary | ICD-10-CM | POA: Diagnosis not present

## 2023-06-13 DIAGNOSIS — Z9049 Acquired absence of other specified parts of digestive tract: Secondary | ICD-10-CM | POA: Diagnosis not present

## 2023-06-13 DIAGNOSIS — H269 Unspecified cataract: Secondary | ICD-10-CM | POA: Diagnosis not present

## 2023-06-13 DIAGNOSIS — L659 Nonscarring hair loss, unspecified: Secondary | ICD-10-CM | POA: Diagnosis not present

## 2023-06-13 DIAGNOSIS — M48061 Spinal stenosis, lumbar region without neurogenic claudication: Secondary | ICD-10-CM | POA: Diagnosis not present

## 2023-06-13 DIAGNOSIS — Z9089 Acquired absence of other organs: Secondary | ICD-10-CM | POA: Diagnosis not present

## 2023-06-13 DIAGNOSIS — F419 Anxiety disorder, unspecified: Secondary | ICD-10-CM | POA: Diagnosis not present

## 2023-06-19 DIAGNOSIS — F419 Anxiety disorder, unspecified: Secondary | ICD-10-CM | POA: Diagnosis not present

## 2023-06-19 DIAGNOSIS — M7071 Other bursitis of hip, right hip: Secondary | ICD-10-CM | POA: Diagnosis not present

## 2023-06-19 DIAGNOSIS — F5101 Primary insomnia: Secondary | ICD-10-CM | POA: Diagnosis not present

## 2023-06-19 DIAGNOSIS — F322 Major depressive disorder, single episode, severe without psychotic features: Secondary | ICD-10-CM | POA: Diagnosis not present

## 2023-06-19 DIAGNOSIS — M8008XD Age-related osteoporosis with current pathological fracture, vertebra(e), subsequent encounter for fracture with routine healing: Secondary | ICD-10-CM | POA: Diagnosis not present

## 2023-06-19 DIAGNOSIS — Z9181 History of falling: Secondary | ICD-10-CM | POA: Diagnosis not present

## 2023-06-19 DIAGNOSIS — L659 Nonscarring hair loss, unspecified: Secondary | ICD-10-CM | POA: Diagnosis not present

## 2023-06-19 DIAGNOSIS — Z85038 Personal history of other malignant neoplasm of large intestine: Secondary | ICD-10-CM | POA: Diagnosis not present

## 2023-06-19 DIAGNOSIS — M48061 Spinal stenosis, lumbar region without neurogenic claudication: Secondary | ICD-10-CM | POA: Diagnosis not present

## 2023-06-19 DIAGNOSIS — K5790 Diverticulosis of intestine, part unspecified, without perforation or abscess without bleeding: Secondary | ICD-10-CM | POA: Diagnosis not present

## 2023-06-19 DIAGNOSIS — E7800 Pure hypercholesterolemia, unspecified: Secondary | ICD-10-CM | POA: Diagnosis not present

## 2023-06-19 DIAGNOSIS — Z9089 Acquired absence of other organs: Secondary | ICD-10-CM | POA: Diagnosis not present

## 2023-06-19 DIAGNOSIS — H269 Unspecified cataract: Secondary | ICD-10-CM | POA: Diagnosis not present

## 2023-06-19 DIAGNOSIS — Z9049 Acquired absence of other specified parts of digestive tract: Secondary | ICD-10-CM | POA: Diagnosis not present

## 2023-06-19 DIAGNOSIS — K219 Gastro-esophageal reflux disease without esophagitis: Secondary | ICD-10-CM | POA: Diagnosis not present

## 2023-06-19 DIAGNOSIS — M1611 Unilateral primary osteoarthritis, right hip: Secondary | ICD-10-CM | POA: Diagnosis not present

## 2023-06-20 DIAGNOSIS — M47816 Spondylosis without myelopathy or radiculopathy, lumbar region: Secondary | ICD-10-CM | POA: Diagnosis not present

## 2023-06-21 ENCOUNTER — Emergency Department (HOSPITAL_COMMUNITY): Payer: Medicare Other

## 2023-06-21 ENCOUNTER — Inpatient Hospital Stay (HOSPITAL_COMMUNITY): Payer: Medicare Other | Admitting: Anesthesiology

## 2023-06-21 ENCOUNTER — Inpatient Hospital Stay (HOSPITAL_COMMUNITY): Payer: Medicare Other

## 2023-06-21 ENCOUNTER — Encounter (HOSPITAL_COMMUNITY): Payer: Self-pay | Admitting: Internal Medicine

## 2023-06-21 ENCOUNTER — Inpatient Hospital Stay (HOSPITAL_COMMUNITY)
Admission: EM | Admit: 2023-06-21 | Discharge: 2023-06-25 | DRG: 522 | Disposition: A | Discharge status: Skilled Nursing Facility | Payer: Medicare Other | Attending: Internal Medicine | Admitting: Internal Medicine

## 2023-06-21 ENCOUNTER — Other Ambulatory Visit: Payer: Self-pay

## 2023-06-21 DIAGNOSIS — G8918 Other acute postprocedural pain: Secondary | ICD-10-CM | POA: Diagnosis not present

## 2023-06-21 DIAGNOSIS — K219 Gastro-esophageal reflux disease without esophagitis: Secondary | ICD-10-CM | POA: Diagnosis present

## 2023-06-21 DIAGNOSIS — Y9301 Activity, walking, marching and hiking: Secondary | ICD-10-CM | POA: Diagnosis present

## 2023-06-21 DIAGNOSIS — I700 Atherosclerosis of aorta: Secondary | ICD-10-CM | POA: Diagnosis not present

## 2023-06-21 DIAGNOSIS — R32 Unspecified urinary incontinence: Secondary | ICD-10-CM | POA: Diagnosis not present

## 2023-06-21 DIAGNOSIS — F32A Depression, unspecified: Secondary | ICD-10-CM | POA: Diagnosis present

## 2023-06-21 DIAGNOSIS — Z7401 Bed confinement status: Secondary | ICD-10-CM | POA: Diagnosis not present

## 2023-06-21 DIAGNOSIS — M810 Age-related osteoporosis without current pathological fracture: Secondary | ICD-10-CM | POA: Diagnosis not present

## 2023-06-21 DIAGNOSIS — S72091A Other fracture of head and neck of right femur, initial encounter for closed fracture: Secondary | ICD-10-CM | POA: Diagnosis not present

## 2023-06-21 DIAGNOSIS — Z881 Allergy status to other antibiotic agents status: Secondary | ICD-10-CM | POA: Diagnosis not present

## 2023-06-21 DIAGNOSIS — S199XXA Unspecified injury of neck, initial encounter: Secondary | ICD-10-CM | POA: Diagnosis not present

## 2023-06-21 DIAGNOSIS — W010XXA Fall on same level from slipping, tripping and stumbling without subsequent striking against object, initial encounter: Secondary | ICD-10-CM | POA: Diagnosis present

## 2023-06-21 DIAGNOSIS — Z79899 Other long term (current) drug therapy: Secondary | ICD-10-CM | POA: Diagnosis not present

## 2023-06-21 DIAGNOSIS — Z85038 Personal history of other malignant neoplasm of large intestine: Secondary | ICD-10-CM | POA: Diagnosis not present

## 2023-06-21 DIAGNOSIS — Z888 Allergy status to other drugs, medicaments and biological substances status: Secondary | ICD-10-CM

## 2023-06-21 DIAGNOSIS — M48061 Spinal stenosis, lumbar region without neurogenic claudication: Secondary | ICD-10-CM | POA: Diagnosis not present

## 2023-06-21 DIAGNOSIS — R262 Difficulty in walking, not elsewhere classified: Secondary | ICD-10-CM | POA: Diagnosis not present

## 2023-06-21 DIAGNOSIS — S72041A Displaced fracture of base of neck of right femur, initial encounter for closed fracture: Secondary | ICD-10-CM | POA: Diagnosis not present

## 2023-06-21 DIAGNOSIS — R030 Elevated blood-pressure reading, without diagnosis of hypertension: Secondary | ICD-10-CM

## 2023-06-21 DIAGNOSIS — Z85820 Personal history of malignant melanoma of skin: Secondary | ICD-10-CM

## 2023-06-21 DIAGNOSIS — Y92002 Bathroom of unspecified non-institutional (private) residence single-family (private) house as the place of occurrence of the external cause: Secondary | ICD-10-CM | POA: Diagnosis not present

## 2023-06-21 DIAGNOSIS — S72001D Fracture of unspecified part of neck of right femur, subsequent encounter for closed fracture with routine healing: Secondary | ICD-10-CM | POA: Diagnosis not present

## 2023-06-21 DIAGNOSIS — S72001A Fracture of unspecified part of neck of right femur, initial encounter for closed fracture: Principal | ICD-10-CM

## 2023-06-21 DIAGNOSIS — Z471 Aftercare following joint replacement surgery: Secondary | ICD-10-CM | POA: Diagnosis not present

## 2023-06-21 DIAGNOSIS — F419 Anxiety disorder, unspecified: Secondary | ICD-10-CM | POA: Diagnosis not present

## 2023-06-21 DIAGNOSIS — Z961 Presence of intraocular lens: Secondary | ICD-10-CM | POA: Diagnosis present

## 2023-06-21 DIAGNOSIS — Z9189 Other specified personal risk factors, not elsewhere classified: Secondary | ICD-10-CM | POA: Diagnosis not present

## 2023-06-21 DIAGNOSIS — Z9049 Acquired absence of other specified parts of digestive tract: Secondary | ICD-10-CM

## 2023-06-21 DIAGNOSIS — W19XXXA Unspecified fall, initial encounter: Secondary | ICD-10-CM | POA: Diagnosis not present

## 2023-06-21 DIAGNOSIS — M6281 Muscle weakness (generalized): Secondary | ICD-10-CM | POA: Diagnosis not present

## 2023-06-21 DIAGNOSIS — R9089 Other abnormal findings on diagnostic imaging of central nervous system: Secondary | ICD-10-CM | POA: Diagnosis not present

## 2023-06-21 DIAGNOSIS — Z96641 Presence of right artificial hip joint: Secondary | ICD-10-CM | POA: Diagnosis not present

## 2023-06-21 DIAGNOSIS — Z85828 Personal history of other malignant neoplasm of skin: Secondary | ICD-10-CM | POA: Diagnosis not present

## 2023-06-21 DIAGNOSIS — S0990XA Unspecified injury of head, initial encounter: Secondary | ICD-10-CM | POA: Diagnosis not present

## 2023-06-21 DIAGNOSIS — J439 Emphysema, unspecified: Secondary | ICD-10-CM | POA: Diagnosis not present

## 2023-06-21 DIAGNOSIS — S72002A Fracture of unspecified part of neck of left femur, initial encounter for closed fracture: Secondary | ICD-10-CM

## 2023-06-21 DIAGNOSIS — S8991XA Unspecified injury of right lower leg, initial encounter: Secondary | ICD-10-CM | POA: Diagnosis not present

## 2023-06-21 DIAGNOSIS — E785 Hyperlipidemia, unspecified: Secondary | ICD-10-CM | POA: Diagnosis not present

## 2023-06-21 DIAGNOSIS — Z043 Encounter for examination and observation following other accident: Secondary | ICD-10-CM | POA: Diagnosis not present

## 2023-06-21 DIAGNOSIS — S72009A Fracture of unspecified part of neck of unspecified femur, initial encounter for closed fracture: Secondary | ICD-10-CM

## 2023-06-21 DIAGNOSIS — K8020 Calculus of gallbladder without cholecystitis without obstruction: Secondary | ICD-10-CM | POA: Diagnosis not present

## 2023-06-21 DIAGNOSIS — R2689 Other abnormalities of gait and mobility: Secondary | ICD-10-CM | POA: Diagnosis not present

## 2023-06-21 DIAGNOSIS — I10 Essential (primary) hypertension: Secondary | ICD-10-CM | POA: Diagnosis present

## 2023-06-21 DIAGNOSIS — M85861 Other specified disorders of bone density and structure, right lower leg: Secondary | ICD-10-CM | POA: Diagnosis not present

## 2023-06-21 DIAGNOSIS — K4090 Unilateral inguinal hernia, without obstruction or gangrene, not specified as recurrent: Secondary | ICD-10-CM | POA: Diagnosis not present

## 2023-06-21 DIAGNOSIS — S72011D Unspecified intracapsular fracture of right femur, subsequent encounter for closed fracture with routine healing: Secondary | ICD-10-CM | POA: Diagnosis not present

## 2023-06-21 DIAGNOSIS — Z885 Allergy status to narcotic agent status: Secondary | ICD-10-CM | POA: Diagnosis not present

## 2023-06-21 DIAGNOSIS — R000 Tachycardia, unspecified: Secondary | ICD-10-CM | POA: Diagnosis not present

## 2023-06-21 DIAGNOSIS — Z1152 Encounter for screening for COVID-19: Secondary | ICD-10-CM

## 2023-06-21 DIAGNOSIS — M80052A Age-related osteoporosis with current pathological fracture, left femur, initial encounter for fracture: Secondary | ICD-10-CM | POA: Diagnosis present

## 2023-06-21 LAB — BASIC METABOLIC PANEL
Anion gap: 11 (ref 5–15)
BUN: 22 mg/dL (ref 8–23)
CO2: 24 mmol/L (ref 22–32)
Calcium: 9 mg/dL (ref 8.9–10.3)
Chloride: 104 mmol/L (ref 98–111)
Creatinine, Ser: 0.62 mg/dL (ref 0.44–1.00)
GFR, Estimated: >60 mL/min (ref >60)
Glucose, Bld: 109 mg/dL — ABNORMAL HIGH (ref 70–99)
Potassium: 3.7 mmol/L (ref 3.5–5.1)
Sodium: 139 mmol/L (ref 135–145)

## 2023-06-21 LAB — CBC
HCT: 42 % (ref 36.0–46.0)
HCT: 39 % (ref 36.0–46.0)
Hemoglobin: 13.6 g/dL (ref 12.0–15.0)
Hemoglobin: 13.2 g/dL (ref 12.0–15.0)
MCH: 31.2 pg (ref 26.0–34.0)
MCH: 32 pg (ref 26.0–34.0)
MCHC: 32.4 g/dL (ref 30.0–36.0)
MCHC: 33.8 g/dL (ref 30.0–36.0)
MCV: 96.3 fL (ref 80.0–100.0)
MCV: 94.4 fL (ref 80.0–100.0)
Platelets: 272 10*3/uL (ref 150–400)
Platelets: 245 10*3/uL (ref 150–400)
RBC: 4.36 MIL/uL (ref 3.87–5.11)
RBC: 4.13 MIL/uL (ref 3.87–5.11)
RDW: 13.1 % (ref 11.5–15.5)
RDW: 13.1 % (ref 11.5–15.5)
WBC: 19.6 10*3/uL — ABNORMAL HIGH (ref 4.0–10.5)
WBC: 23 10*3/uL — ABNORMAL HIGH (ref 4.0–10.5)
nRBC: 0 % (ref 0.0–0.2)
nRBC: 0 % (ref 0.0–0.2)

## 2023-06-21 LAB — SARS CORONAVIRUS 2 BY RT PCR: SARS Coronavirus 2 by RT PCR: NEGATIVE

## 2023-06-21 MED ORDER — FENTANYL CITRATE PF 50 MCG/ML IJ SOSY
50.0000 ug | PREFILLED_SYRINGE | Freq: Once | INTRAMUSCULAR | Status: AC
  Administered 2023-06-21: 50 ug via INTRAVENOUS
  Filled 2023-06-21: qty 1

## 2023-06-21 MED ORDER — MORPHINE SULFATE (PF) 2 MG/ML IV SOLN: 1.0000 mg | INTRAVENOUS | Status: DC | PRN

## 2023-06-21 MED ORDER — SERTRALINE HCL 50 MG PO TABS
50.0000 mg | ORAL_TABLET | Freq: Every day | ORAL | Status: DC
  Administered 2023-06-21 – 2023-06-25 (×5): 50 mg via ORAL
  Filled 2023-06-21 (×5): qty 1

## 2023-06-21 MED ORDER — SODIUM CHLORIDE 0.9 % IV SOLN
INTRAVENOUS | Status: AC
  Administered 2023-06-21: 1000 mL via INTRAVENOUS

## 2023-06-21 MED ORDER — ROPIVACAINE HCL 5 MG/ML IJ SOLN
INTRAMUSCULAR | Status: DC | PRN
  Administered 2023-06-21: 20 mL via PERINEURAL

## 2023-06-21 MED ORDER — FENTANYL CITRATE (PF) 100 MCG/2ML IJ SOLN
INTRAMUSCULAR | Status: AC
  Filled 2023-06-21: qty 2

## 2023-06-21 MED ORDER — LACTATED RINGERS IV BOLUS
1000.0000 mL | Freq: Once | INTRAVENOUS | Status: AC
  Administered 2023-06-21: 1000 mL via INTRAVENOUS

## 2023-06-21 MED ORDER — NALOXONE HCL 0.4 MG/ML IJ SOLN: 0.4000 mg | INTRAMUSCULAR | Status: DC | PRN

## 2023-06-21 MED ORDER — ONDANSETRON HCL 4 MG/2ML IJ SOLN
4.0000 mg | Freq: Four times a day (QID) | INTRAMUSCULAR | Status: DC | PRN
  Filled 2023-06-21: qty 2

## 2023-06-21 MED ORDER — METHOCARBAMOL 500 MG PO TABS
500.0000 mg | ORAL_TABLET | Freq: Four times a day (QID) | ORAL | Status: DC | PRN
  Administered 2023-06-21: 500 mg via ORAL
  Filled 2023-06-21: qty 1

## 2023-06-21 MED ORDER — ENSURE ENLIVE PO LIQD
237.0000 mL | Freq: Two times a day (BID) | ORAL | Status: DC
  Administered 2023-06-22 – 2023-06-24 (×5): 237 mL via ORAL

## 2023-06-21 MED ORDER — CHLORHEXIDINE GLUCONATE 4 % EX SOLN: 60.0000 mL | Freq: Once | CUTANEOUS | Status: DC

## 2023-06-21 MED ORDER — METHOCARBAMOL 1000 MG/10ML IJ SOLN: 500.0000 mg | Freq: Four times a day (QID) | INTRAVENOUS | Status: DC | PRN

## 2023-06-21 MED ORDER — ADULT MULTIVITAMIN W/MINERALS CH
1.0000 | ORAL_TABLET | Freq: Every day | ORAL | Status: DC
  Administered 2023-06-21 – 2023-06-25 (×5): 1 via ORAL
  Filled 2023-06-21 (×5): qty 1

## 2023-06-21 MED ORDER — DEXAMETHASONE SODIUM PHOSPHATE 10 MG/ML IJ SOLN
INTRAMUSCULAR | Status: DC | PRN
Start: 2023-06-21 — End: 2023-06-21
  Administered 2023-06-21: 10 mg

## 2023-06-21 MED ORDER — FENTANYL CITRATE PF 50 MCG/ML IJ SOSY
25.0000 ug | PREFILLED_SYRINGE | INTRAMUSCULAR | Status: DC | PRN
  Administered 2023-06-21 (×2): 25 ug via INTRAVENOUS
  Filled 2023-06-21 (×2): qty 1

## 2023-06-21 NOTE — Consult Note (Signed)
Reason for Consult:Right hip fx Referring Physician: Dorcas Carrow Time called: 0730 Time at bedside: 0901   Frances Ortiz is an 89 y.o. female.  HPI: Caidance was walking with her RW in her house when the leg got caught on the carpet and caused her to fall. She had immediate right hip pain and could not get up. She was brought to the ED where x-rays showed a right hip fx and orthopedic surgery was consulted. She lives at home with her husband and uses a RW since recent kyphoplasty.  Past Medical History:  Diagnosis Date   Anxiety    GERD (gastroesophageal reflux disease)    Heart murmur    HLD (hyperlipidemia)    Intestinal obstruction (HCC)    Osteoporosis    PONV (postoperative nausea and vomiting)    Skin cancer    melanoma, basal cell, and squamous    Past Surgical History:  Procedure Laterality Date   BREAST BIOPSY     bilateral   BREAST EXCISIONAL BIOPSY Right 30 years ago   fibroadenoma   BREAST EXCISIONAL BIOPSY Left 40 years ago   sclerosis thickening   CATARACT EXTRACTION W/ INTRAOCULAR LENS  IMPLANT, BILATERAL Bilateral    CESAREAN SECTION     x 2   CHOLECYSTECTOMY N/A 11/27/2012   Procedure: LAPAROSCOPIC CHOLECYSTECTOMY WITH INTRAOPERATIVE CHOLANGIOGRAM;  Surgeon: Robyne Askew, MD;  Location: [facility] OR;  Service: General;  Laterality: N/A;   COLECTOMY Right 10/07/2017   right   COLONOSCOPY  2005   COLONOSCOPY  08/2017   "found mass in cecum"   DILATION AND CURETTAGE OF UTERUS     ESOPHAGOGASTRODUODENOSCOPY     IR KYPHO EA ADDL LEVEL THORACIC OR LUMBAR  02/27/2023   IR KYPHO LUMBAR INC FX REDUCE BONE BX UNI/BIL CANNULATION INC/IMAGING  02/27/2023   LAPAROSCOPIC RIGHT COLECTOMY Right 10/07/2017   Procedure: LAPAROSCOPIC RIGHT COLECTOMY ERAS PATHWAY;  Surgeon: Griselda Miner, MD;  Location: [facility] OR;  Service: General;  Laterality: Right;   SKIN CANCER EXCISION     TONSILLECTOMY      Family History  Problem Relation Age of Onset   Pneumonia Mother    Colon cancer  Neg Hx    Colon polyps Neg Hx    Rectal cancer Neg Hx    Stomach cancer Neg Hx     Social History:  reports that she has never smoked. She has never used smokeless tobacco. She reports that she does not drink alcohol and does not use drugs.  Allergies:  Allergies  Allergen Reactions   Aristocort [Triamcinolone] Other (See Comments)    Emotional instability   Cortisone Other (See Comments)    Emotional instability   Erythromycin Other (See Comments)    Stomach pain    Other Other (See Comments)    Epinephrine in novocain makes heart race.   Hydrocodone-Acetaminophen Rash    Medications: I have reviewed the patient's current medications.  Results for orders placed or performed during the hospital encounter of 06/21/23 (from the past 48 hour(s))  CBC     Status: Abnormal   Collection Time: 06/21/23  2:57 AM  Result Value Ref Range   WBC 19.6 (H) 4.0 - 10.5 K/uL   RBC 4.36 3.87 - 5.11 MIL/uL   Hemoglobin 13.6 12.0 - 15.0 g/dL   HCT 40.9 81.1 - 91.4 %   MCV 96.3 80.0 - 100.0 fL   MCH 31.2 26.0 - 34.0 pg   MCHC 32.4 30.0 - 36.0 g/dL  RDW 13.1 11.5 - 15.5 %   Platelets 272 150 - 400 K/uL   nRBC 0.0 0.0 - 0.2 %    Comment: Performed at [facility], [address], [place], [place] [address]  Basic metabolic panel     Status: Abnormal   Collection Time: 06/21/23  2:57 AM  Result Value Ref Range   Sodium 139 135 - 145 mmol/L   Potassium 3.7 3.5 - 5.1 mmol/L   Chloride 104 98 - 111 mmol/L   CO2 24 22 - 32 mmol/L   Glucose, Bld 109 (H) 70 - 99 mg/dL    Comment: Glucose reference range applies only to samples taken after fasting for at least 8 hours.   BUN 22 8 - 23 mg/dL   Creatinine, Ser 1.91 0.44 - 1.00 mg/dL   Calcium 9.0 8.9 - 47.8 mg/dL   GFR, Estimated >29 >56 mL/min    Comment: (NOTE) Calculated using the CKD-EPI Creatinine Equation (2021)    Anion gap 11 5 - 15    Comment: Performed at [facility], [address] [place], [place] [address]  SARS  Coronavirus 2 by RT PCR (hospital order, performed in [facility] hospital lab) *cepheid single result test* Anterior Nasal Swab     Status: None   Collection Time: 06/21/23  3:55 AM   Specimen: Anterior Nasal Swab  Result Value Ref Range   SARS Coronavirus 2 by RT PCR NEGATIVE NEGATIVE    Comment: Performed at [facility], [address] [place], [place] [address]  CBC     Status: Abnormal   Collection Time: 06/21/23  6:34 AM  Result Value Ref Range   WBC 23.0 (H) 4.0 - 10.5 K/uL   RBC 4.13 3.87 - 5.11 MIL/uL   Hemoglobin 13.2 12.0 - 15.0 g/dL   HCT 69.6 29.5 - 28.4 %   MCV 94.4 80.0 - 100.0 fL   MCH 32.0 26.0 - 34.0 pg   MCHC 33.8 30.0 - 36.0 g/dL   RDW 13.2 44.0 - 10.2 %   Platelets 245 150 - 400 K/uL   nRBC 0.0 0.0 - 0.2 %    Comment: Performed at [facility], [address] [place], [place] [address]    CT HIP RIGHT WO CONTRAST  Result Date: 06/21/2023 CLINICAL DATA:  Right femoral neck fracture.  Fell while walking. EXAM: CT OF THE RIGHT HIP WITHOUT CONTRAST TECHNIQUE: Multidetector CT imaging of the right hip was performed according to the standard protocol. Multiplanar CT image reconstructions were also generated. RADIATION DOSE REDUCTION: This exam was performed according to the departmental dose-optimization program which includes automated exposure control, adjustment of the mA and/or kV according to patient size and/or use of iterative reconstruction technique. COMPARISON:  Right hip x-rays from same day. FINDINGS: Bones/Joint/Cartilage Acute impacted right subcapital femoral neck fracture. No dislocation. Mild right hip joint space narrowing with small marginal osteophytes. No significant joint effusion. Ligaments Ligaments are suboptimally evaluated by CT. Muscles and Tendons Grossly intact.  Gluteus minimus and medius muscle atrophy. Soft tissue Soft tissue swelling along the posterolateral hip with small 2.0 x 1.6 x 1.8 cm hematoma. No soft tissue mass. Small  fat containing right inguinal hernia. IMPRESSION: 1. Acute impacted right subcapital femoral neck fracture. 2. Soft tissue swelling along the posterolateral hip with small 2.0 cm hematoma. Electronically Signed   By: Obie Dredge M.D.   On: 06/21/2023 07:52   DG Knee Right Port  Result Date:  06/21/2023 CLINICAL DATA:  Right hip fracture after ground level fall. EXAM: PORTABLE RIGHT KNEE - 1-2 VIEW COMPARISON:  None Available. FINDINGS: Osteopenia. There is no joint effusion. There is no sign of acute fracture or dislocation. Soft tissues are unremarkable. IMPRESSION: 1. No acute osseous findings. 2. Osteopenia. Electronically Signed   By: Signa Kell M.D.   On: 06/21/2023 07:52   DG HIP UNILAT WITH PELVIS 2-3 VIEWS RIGHT  Result Date: 06/21/2023 CLINICAL DATA:  [id number] Fall [id number] EXAM: DG HIP (WITH OR WITHOUT PELVIS) 2-3V RIGHT COMPARISON:  CT abdomen pelvis 10/02/2017. FINDINGS: Shortening of the right femoral neck with associated poorly visualized lucency along the neck/head consistent with an acute fracture. No right hip dislocation peer frontal view of the left hip demonstrates no acute fracture or dislocation. Acute displaced fracture or diastasis of the bones of the pelvis. There is no evidence of severe arthropathy or other focal bone abnormality. Kyphoplasty noted of the lumbar spine. IMPRESSION: Acute right femoral neck/neck fracture poorly visualized. Electronically Signed   By: Tish Frederickson M.D.   On: 06/21/2023 03:16   DG Chest Port 1 View  Result Date: 06/21/2023 CLINICAL DATA:  fall EXAM: PORTABLE CHEST 1 VIEW COMPARISON:  CT abdomen pelvis 10/02/2017 FINDINGS: The heart and mediastinal contours are within normal limits. Aortic calcification. Hyperinflation of the lungs. No focal consolidation. Coarsened interstitial markings with no overt pulmonary edema. No pleural effusion. No pneumothorax. No acute osseous abnormality. IMPRESSION: 1. No active disease. 2. Aortic Atherosclerosis  (ICD10-I70.0) and Emphysema (ICD10-J43.9). Electronically Signed   By: Tish Frederickson M.D.   On: 06/21/2023 03:14   CT HEAD WO CONTRAST ( )  Result Date: 06/21/2023 CLINICAL DATA:  Polytrauma, blunt EXAM: CT HEAD WITHOUT CONTRAST CT CERVICAL SPINE WITHOUT CONTRAST TECHNIQUE: Multidetector CT imaging of the head and cervical spine was performed following the standard protocol without intravenous contrast. Multiplanar CT image reconstructions of the cervical spine were also generated. RADIATION DOSE REDUCTION: This exam was performed according to the departmental dose-optimization program which includes automated exposure control, adjustment of the mA and/or kV according to patient size and/or use of iterative reconstruction technique. COMPARISON:  CT head 12/09/2022 FINDINGS: CT HEAD FINDINGS Brain: Cerebral ventricle sizes are concordant with the degree of cerebral volume loss. Patchy and confluent areas of decreased attenuation are noted throughout the deep and periventricular white matter of the cerebral hemispheres bilaterally, compatible with chronic microvascular ischemic disease. No evidence of large-territorial acute infarction. No parenchymal hemorrhage. No mass lesion. No extra-axial collection. No mass effect or midline shift. No hydrocephalus. Basilar cisterns are patent. Vascular: No hyperdense vessel. Skull: No acute fracture or focal lesion. Sinuses/Orbits: Right sphenoid sinus mucosal thickening with calcifications. Paranasal sinuses and mastoid air cells are clear. Bilateral lens replacement. Otherwise the orbits are unremarkable. Other: None. CT CERVICAL SPINE FINDINGS Alignment: Grade 1 anterolisthesis of C3 on C4 and C4 on C5. Skull base and vertebrae: Multilevel moderate severe degenerative changes spine most prominent at the C6-C7 level. Associated multilevel severe osseous neural foraminal stenosis. No severe osseous central canal stenosis. No acute fracture. No aggressive appearing  focal osseous lesion or focal pathologic process. Soft tissues and spinal canal: No prevertebral fluid or swelling. No visible canal hematoma. Upper chest: Unremarkable. Other: None. IMPRESSION: 1. No acute intracranial abnormality. 2. No acute displaced fracture or traumatic listhesis of the cervical spine. 3. Right sphenoid sinus mucosal thickening with calcifications. Findings suggestive of chronic versus fungal sinusitis. Electronically Signed   By: Normajean Glasgow.D.  On: 06/21/2023 03:12   CT Cervical Spine Wo Contrast  Result Date: 06/21/2023 CLINICAL DATA:  Polytrauma, blunt EXAM: CT HEAD WITHOUT CONTRAST CT CERVICAL SPINE WITHOUT CONTRAST TECHNIQUE: Multidetector CT imaging of the head and cervical spine was performed following the standard protocol without intravenous contrast. Multiplanar CT image reconstructions of the cervical spine were also generated. RADIATION DOSE REDUCTION: This exam was performed according to the departmental dose-optimization program which includes automated exposure control, adjustment of the mA and/or kV according to patient size and/or use of iterative reconstruction technique. COMPARISON:  CT head 12/09/2022 FINDINGS: CT HEAD FINDINGS Brain: Cerebral ventricle sizes are concordant with the degree of cerebral volume loss. Patchy and confluent areas of decreased attenuation are noted throughout the deep and periventricular white matter of the cerebral hemispheres bilaterally, compatible with chronic microvascular ischemic disease. No evidence of large-territorial acute infarction. No parenchymal hemorrhage. No mass lesion. No extra-axial collection. No mass effect or midline shift. No hydrocephalus. Basilar cisterns are patent. Vascular: No hyperdense vessel. Skull: No acute fracture or focal lesion. Sinuses/Orbits: Right sphenoid sinus mucosal thickening with calcifications. Paranasal sinuses and mastoid air cells are clear. Bilateral lens replacement. Otherwise the  orbits are unremarkable. Other: None. CT CERVICAL SPINE FINDINGS Alignment: Grade 1 anterolisthesis of C3 on C4 and C4 on C5. Skull base and vertebrae: Multilevel moderate severe degenerative changes spine most prominent at the C6-C7 level. Associated multilevel severe osseous neural foraminal stenosis. No severe osseous central canal stenosis. No acute fracture. No aggressive appearing focal osseous lesion or focal pathologic process. Soft tissues and spinal canal: No prevertebral fluid or swelling. No visible canal hematoma. Upper chest: Unremarkable. Other: None. IMPRESSION: 1. No acute intracranial abnormality. 2. No acute displaced fracture or traumatic listhesis of the cervical spine. 3. Right sphenoid sinus mucosal thickening with calcifications. Findings suggestive of chronic versus fungal sinusitis. Electronically Signed   By: Tish Frederickson M.D.   On: 06/21/2023 03:12    Review of Systems  HENT:  Negative for ear discharge, ear pain, hearing loss and tinnitus.   Eyes:  Negative for photophobia and pain.  Respiratory:  Negative for cough and shortness of breath.   Cardiovascular:  Negative for chest pain.  Gastrointestinal:  Negative for abdominal pain, nausea and vomiting.  Genitourinary:  Negative for dysuria, flank pain, frequency and urgency.  Musculoskeletal:  Positive for arthralgias (Right hip) and back pain. Negative for myalgias and neck pain.  Neurological:  Negative for dizziness and headaches.  Hematological:  Does not bruise/bleed easily.  Psychiatric/Behavioral:  The patient is not nervous/anxious.    Blood pressure 135/69, pulse (!) 42, temperature 98.3 F (36.8 C), temperature source Oral, resp. rate (!) 28, height 4\' 11"  (1.499 m), weight 61 kg, SpO2 95%. Physical Exam Constitutional:      General: She is not in acute distress.    Appearance: She is well-developed. She is not diaphoretic.  HENT:     Head: Normocephalic and atraumatic.  Eyes:     General: No scleral  icterus.       Right eye: No discharge.        Left eye: No discharge.     Conjunctiva/sclera: Conjunctivae normal.  Cardiovascular:     Rate and Rhythm: Normal rate and regular rhythm.  Pulmonary:     Effort: Pulmonary effort is normal. No respiratory distress.  Musculoskeletal:     Cervical back: Normal range of motion.     Comments: RLE No traumatic wounds, ecchymosis, or rash  Mod TTP hip  No knee or ankle effusion  Knee stable to varus/ valgus and anterior/posterior stress  Sens DPN, SPN, TN intact  Motor EHL, ext, flex, evers 5/5  DP 2+, PT 1+, 1+ NP edema  Skin:    General: Skin is warm and dry.  Neurological:     Mental Status: She is alert.  Psychiatric:        Mood and Affect: Mood normal.        Behavior: Behavior normal.     Assessment/Plan: Right hip fx -- Plan hip hemi tomorrow with Dr. Dion Saucier. Please keep NPO after MN. Multiple medical problems including hyperlipidemia, GERD, anxiety/depression, and cecal cancer status post right colectomy -- per primary service    Freeman Caldron, PA-C Orthopedic Surgery [phone] 06/21/2023, 9:18 AM

## 2023-06-21 NOTE — Anesthesia Preprocedure Evaluation (Signed)
Anesthesia Evaluation  Patient identified by MRN, date of birth, ID band Patient awake    Reviewed: Allergy & Precautions, NPO status , Patient's Chart, lab work & pertinent test results  History of Anesthesia Complications (+) PONV and history of anesthetic complications  Airway Mallampati: II  TM Distance: >3 FB Neck ROM: Full    Dental  (+) Teeth Intact, Dental Advisory Given   Pulmonary neg pulmonary ROS   Pulmonary exam normal breath sounds clear to auscultation       Cardiovascular Normal cardiovascular exam+ dysrhythmias Atrial Fibrillation  Rhythm:Regular Rate:Normal     Neuro/Psych  PSYCHIATRIC DISORDERS Anxiety     negative neurological ROS     GI/Hepatic Neg liver ROS,GERD  ,,  Endo/Other  negative endocrine ROS    Renal/GU negative Renal ROS     Musculoskeletal negative musculoskeletal ROS (+)    Abdominal   Peds  Hematology negative hematology ROS (+)   Anesthesia Other Findings Day of surgery medications reviewed with the patient.  Reproductive/Obstetrics                             Anesthesia Physical Anesthesia Plan  ASA: 3  Anesthesia Plan:    Post-op Pain Management: Regional block*   Induction:   PONV Risk Score and Plan: 3 and Treatment may vary due to age or medical condition  Airway Management Planned: Natural Airway  Additional Equipment:   Intra-op Plan:   Post-operative Plan:   Informed Consent: I have reviewed the patients History and Physical, chart, labs and discussed the procedure including the risks, benefits and alternatives for the proposed anesthesia with the patient or authorized representative who has indicated his/her understanding and acceptance.     Dental advisory given  Plan Discussed with: CRNA  Anesthesia Plan Comments:        Anesthesia Quick Evaluation

## 2023-06-21 NOTE — ED Triage Notes (Signed)
Pt arrives to ED c/o mechanical fall and landing on right side after getting walker caught on carpet. Pt endorses hitting head, no LOC or thinner, pt c/o right hip pain. No deformities noted

## 2023-06-21 NOTE — ED Notes (Signed)
ED TO INPATIENT HANDOFF REPORT  ED Nurse Name and Phone #: Marcelino Duster [phone]  S Name/Age/Gender Frances Ortiz 89 y.o. female Room/Bed: 024C/024C  Code Status   Code Status: Do not attempt resuscitation (DNR) PRE-ARREST INTERVENTIONS DESIRED  Home/SNF/Other Rehab Patient oriented to: self, place, time, and situation Is this baseline? Yes   Triage Complete: Triage complete  Chief Complaint Hip fracture [facility]) [S72.009A]  Triage Note Pt arrives to ED c/o mechanical fall and landing on right side after getting walker caught on carpet. Pt endorses hitting head, no LOC or thinner, pt c/o right hip pain. No deformities noted   Allergies Allergies  Allergen Reactions   Aristocort [Triamcinolone] Other (See Comments)    Emotional instability   Cortisone Other (See Comments)    Emotional instability   Erythromycin Other (See Comments)    Stomach pain    Other Other (See Comments)    Epinephrine in novocain makes heart race.   Hydrocodone-Acetaminophen Rash    Level of Care/Admitting Diagnosis ED Disposition     ED Disposition  Admit   Condition  --   Comment  Hospital [facility]: [facility] [[id number]]  Level of Care: Progressive [102]  Admit to Progressive based on following criteria: CARDIOVASCULAR & THORACIC of moderate stability with acute coronary syndrome symptoms/low risk myocardial infarction/hypertensive urgency/arrhythmias/heart failure potentially compromising stability and stable post cardiovascular intervention patients.  May admit patient to [facility] or [facility] if equivalent level of care is available:: Yes  Covid Evaluation: Asymptomatic - no recent exposure (last 10 days) testing not required  Diagnosis: Hip fracture [facility]) [[id number]]  Admitting Physician: John Giovanni [[id number]]  Attending Physician: John Giovanni [[id number]]  Certification:: I certify this patient will need inpatient services for at least 2 midnights  Expected  Medical Readiness: 06/24/2023          B Medical/Surgery History Past Medical History:  Diagnosis Date   Anxiety    GERD (gastroesophageal reflux disease)    Heart murmur    HLD (hyperlipidemia)    Intestinal obstruction (HCC)    Osteoporosis    PONV (postoperative nausea and vomiting)    Skin cancer    melanoma, basal cell, and squamous   Past Surgical History:  Procedure Laterality Date   BREAST BIOPSY     bilateral   BREAST EXCISIONAL BIOPSY Right 30 years ago   fibroadenoma   BREAST EXCISIONAL BIOPSY Left 40 years ago   sclerosis thickening   CATARACT EXTRACTION W/ INTRAOCULAR LENS  IMPLANT, BILATERAL Bilateral    CESAREAN SECTION     x 2   CHOLECYSTECTOMY N/A 11/27/2012   Procedure: LAPAROSCOPIC CHOLECYSTECTOMY WITH INTRAOPERATIVE CHOLANGIOGRAM;  Surgeon: Robyne Askew, MD;  Location: [facility] OR;  Service: General;  Laterality: N/A;   COLECTOMY Right 10/07/2017   right   COLONOSCOPY  2005   COLONOSCOPY  08/2017   "found mass in cecum"   DILATION AND CURETTAGE OF UTERUS     ESOPHAGOGASTRODUODENOSCOPY     IR KYPHO EA ADDL LEVEL THORACIC OR LUMBAR  02/27/2023   IR KYPHO LUMBAR INC FX REDUCE BONE BX UNI/BIL CANNULATION INC/IMAGING  02/27/2023   LAPAROSCOPIC RIGHT COLECTOMY Right 10/07/2017   Procedure: LAPAROSCOPIC RIGHT COLECTOMY ERAS PATHWAY;  Surgeon: Griselda Miner, MD;  Location: [facility] OR;  Service: General;  Laterality: Right;   SKIN CANCER EXCISION     TONSILLECTOMY       A IV Location/Drains/Wounds Patient Lines/Drains/Airways Status     Active Line/Drains/Airways  Name Placement date Placement time Site Days   Peripheral IV 06/21/23 20 G 1" Anterior;Proximal;Right Forearm 06/21/23  0348  Forearm  less than 1   Incision - 4 Ports Abdomen 1: Umbilicus 2: Mid;Upper 3: Right;Medial 4: Right;Lateral 11/27/12  1102  -- 3858   Wound / Incision (Open or Dehisced) 02/27/23 Puncture Vertebral column Lower;Right;Left 02/27/23  1048  Vertebral column  114             Intake/Output Last 24 hours  Intake/Output Summary (Last 24 hours) at 06/21/2023 0950 Last data filed at 06/21/2023 0548 Gross per 24 hour  Intake 1000 ml  Output --  Net 1000 ml    Labs/Imaging Results for orders placed or performed during the hospital encounter of 06/21/23 (from the past 48 hour(s))  CBC     Status: Abnormal   Collection Time: 06/21/23  2:57 AM  Result Value Ref Range   WBC 19.6 (H) 4.0 - 10.5 K/uL   RBC 4.36 3.87 - 5.11 MIL/uL   Hemoglobin 13.6 12.0 - 15.0 g/dL   HCT 03.4 74.2 - 59.5 %   MCV 96.3 80.0 - 100.0 fL   MCH 31.2 26.0 - 34.0 pg   MCHC 32.4 30.0 - 36.0 g/dL   RDW 63.8 75.6 - 43.3 %   Platelets 272 150 - 400 K/uL   nRBC 0.0 0.0 - 0.2 %    Comment: Performed at [facility], [address], [place], [place] [address]  Basic metabolic panel     Status: Abnormal   Collection Time: 06/21/23  2:57 AM  Result Value Ref Range   Sodium 139 135 - 145 mmol/L   Potassium 3.7 3.5 - 5.1 mmol/L   Chloride 104 98 - 111 mmol/L   CO2 24 22 - 32 mmol/L   Glucose, Bld 109 (H) 70 - 99 mg/dL    Comment: Glucose reference range applies only to samples taken after fasting for at least 8 hours.   BUN 22 8 - 23 mg/dL   Creatinine, Ser 8.41 0.44 - 1.00 mg/dL   Calcium 9.0 8.9 - 66.0 mg/dL   GFR, Estimated >63 >01 mL/min    Comment: (NOTE) Calculated using the CKD-EPI Creatinine Equation (2021)    Anion gap 11 5 - 15    Comment: Performed at [facility], [address], [place], [place] [address]  SARS Coronavirus 2 by RT PCR (hospital order, performed in [facility] hospital lab) *cepheid single result test* Anterior Nasal Swab     Status: None   Collection Time: 06/21/23  3:55 AM   Specimen: Anterior Nasal Swab  Result Value Ref Range   SARS Coronavirus 2 by RT PCR NEGATIVE NEGATIVE    Comment: Performed at [facility], [address], [place], [place] [address]  CBC     Status: Abnormal   Collection Time: 06/21/23  6:34 AM  Result Value  Ref Range   WBC 23.0 (H) 4.0 - 10.5 K/uL   RBC 4.13 3.87 - 5.11 MIL/uL   Hemoglobin 13.2 12.0 - 15.0 g/dL   HCT 73.2 20.2 - 54.2 %   MCV 94.4 80.0 - 100.0 fL   MCH 32.0 26.0 - 34.0 pg   MCHC 33.8 30.0 - 36.0 g/dL   RDW 70.6 23.7 - 62.8 %   Platelets 245 150 - 400 K/uL   nRBC 0.0 0.0 - 0.2 %    Comment: Performed at [facility], [address], [place],  [place] [address]   CT HIP RIGHT WO CONTRAST  Result Date: 06/21/2023 CLINICAL DATA:  Right femoral neck fracture.  Fell while walking. EXAM: CT OF THE RIGHT HIP WITHOUT CONTRAST TECHNIQUE: Multidetector CT imaging of the right hip was performed according to the standard protocol. Multiplanar CT image reconstructions were also generated. RADIATION DOSE REDUCTION: This exam was performed according to the departmental dose-optimization program which includes automated exposure control, adjustment of the mA and/or kV according to patient size and/or use of iterative reconstruction technique. COMPARISON:  Right hip x-rays from same day. FINDINGS: Bones/Joint/Cartilage Acute impacted right subcapital femoral neck fracture. No dislocation. Mild right hip joint space narrowing with small marginal osteophytes. No significant joint effusion. Ligaments Ligaments are suboptimally evaluated by CT. Muscles and Tendons Grossly intact.  Gluteus minimus and medius muscle atrophy. Soft tissue Soft tissue swelling along the posterolateral hip with small 2.0 x 1.6 x 1.8 cm hematoma. No soft tissue mass. Small fat containing right inguinal hernia. IMPRESSION: 1. Acute impacted right subcapital femoral neck fracture. 2. Soft tissue swelling along the posterolateral hip with small 2.0 cm hematoma. Electronically Signed   By: Obie Dredge M.D.   On: 06/21/2023 07:52   DG Knee Right Port  Result Date: 06/21/2023 CLINICAL DATA:  Right hip fracture after ground level fall. EXAM: PORTABLE RIGHT KNEE - 1-2 VIEW COMPARISON:  None Available. FINDINGS: Osteopenia. There  is no joint effusion. There is no sign of acute fracture or dislocation. Soft tissues are unremarkable. IMPRESSION: 1. No acute osseous findings. 2. Osteopenia. Electronically Signed   By: Signa Kell M.D.   On: 06/21/2023 07:52   DG HIP UNILAT WITH PELVIS 2-3 VIEWS RIGHT  Result Date: 06/21/2023 CLINICAL DATA:  [id number] Fall [id number] EXAM: DG HIP (WITH OR WITHOUT PELVIS) 2-3V RIGHT COMPARISON:  CT abdomen pelvis 10/02/2017. FINDINGS: Shortening of the right femoral neck with associated poorly visualized lucency along the neck/head consistent with an acute fracture. No right hip dislocation peer frontal view of the left hip demonstrates no acute fracture or dislocation. Acute displaced fracture or diastasis of the bones of the pelvis. There is no evidence of severe arthropathy or other focal bone abnormality. Kyphoplasty noted of the lumbar spine. IMPRESSION: Acute right femoral neck/neck fracture poorly visualized. Electronically Signed   By: Tish Frederickson M.D.   On: 06/21/2023 03:16   DG Chest Port 1 View  Result Date: 06/21/2023 CLINICAL DATA:  fall EXAM: PORTABLE CHEST 1 VIEW COMPARISON:  CT abdomen pelvis 10/02/2017 FINDINGS: The heart and mediastinal contours are within normal limits. Aortic calcification. Hyperinflation of the lungs. No focal consolidation. Coarsened interstitial markings with no overt pulmonary edema. No pleural effusion. No pneumothorax. No acute osseous abnormality. IMPRESSION: 1. No active disease. 2. Aortic Atherosclerosis (ICD10-I70.0) and Emphysema (ICD10-J43.9). Electronically Signed   By: Tish Frederickson M.D.   On: 06/21/2023 03:14   CT HEAD WO CONTRAST ( )  Result Date: 06/21/2023 CLINICAL DATA:  Polytrauma, blunt EXAM: CT HEAD WITHOUT CONTRAST CT CERVICAL SPINE WITHOUT CONTRAST TECHNIQUE: Multidetector CT imaging of the head and cervical spine was performed following the standard protocol without intravenous contrast. Multiplanar CT image reconstructions of the  cervical spine were also generated. RADIATION DOSE REDUCTION: This exam was performed according to the departmental dose-optimization program which includes automated exposure control, adjustment of the mA and/or kV according to patient size and/or use of iterative reconstruction technique. COMPARISON:  CT head 12/09/2022 FINDINGS: CT HEAD FINDINGS Brain: Cerebral ventricle sizes are concordant with the degree of cerebral  volume loss. Patchy and confluent areas of decreased attenuation are noted throughout the deep and periventricular white matter of the cerebral hemispheres bilaterally, compatible with chronic microvascular ischemic disease. No evidence of large-territorial acute infarction. No parenchymal hemorrhage. No mass lesion. No extra-axial collection. No mass effect or midline shift. No hydrocephalus. Basilar cisterns are patent. Vascular: No hyperdense vessel. Skull: No acute fracture or focal lesion. Sinuses/Orbits: Right sphenoid sinus mucosal thickening with calcifications. Paranasal sinuses and mastoid air cells are clear. Bilateral lens replacement. Otherwise the orbits are unremarkable. Other: None. CT CERVICAL SPINE FINDINGS Alignment: Grade 1 anterolisthesis of C3 on C4 and C4 on C5. Skull base and vertebrae: Multilevel moderate severe degenerative changes spine most prominent at the C6-C7 level. Associated multilevel severe osseous neural foraminal stenosis. No severe osseous central canal stenosis. No acute fracture. No aggressive appearing focal osseous lesion or focal pathologic process. Soft tissues and spinal canal: No prevertebral fluid or swelling. No visible canal hematoma. Upper chest: Unremarkable. Other: None. IMPRESSION: 1. No acute intracranial abnormality. 2. No acute displaced fracture or traumatic listhesis of the cervical spine. 3. Right sphenoid sinus mucosal thickening with calcifications. Findings suggestive of chronic versus fungal sinusitis. Electronically Signed   By:  Tish Frederickson M.D.   On: 06/21/2023 03:12   CT Cervical Spine Wo Contrast  Result Date: 06/21/2023 CLINICAL DATA:  Polytrauma, blunt EXAM: CT HEAD WITHOUT CONTRAST CT CERVICAL SPINE WITHOUT CONTRAST TECHNIQUE: Multidetector CT imaging of the head and cervical spine was performed following the standard protocol without intravenous contrast. Multiplanar CT image reconstructions of the cervical spine were also generated. RADIATION DOSE REDUCTION: This exam was performed according to the departmental dose-optimization program which includes automated exposure control, adjustment of the mA and/or kV according to patient size and/or use of iterative reconstruction technique. COMPARISON:  CT head 12/09/2022 FINDINGS: CT HEAD FINDINGS Brain: Cerebral ventricle sizes are concordant with the degree of cerebral volume loss. Patchy and confluent areas of decreased attenuation are noted throughout the deep and periventricular white matter of the cerebral hemispheres bilaterally, compatible with chronic microvascular ischemic disease. No evidence of large-territorial acute infarction. No parenchymal hemorrhage. No mass lesion. No extra-axial collection. No mass effect or midline shift. No hydrocephalus. Basilar cisterns are patent. Vascular: No hyperdense vessel. Skull: No acute fracture or focal lesion. Sinuses/Orbits: Right sphenoid sinus mucosal thickening with calcifications. Paranasal sinuses and mastoid air cells are clear. Bilateral lens replacement. Otherwise the orbits are unremarkable. Other: None. CT CERVICAL SPINE FINDINGS Alignment: Grade 1 anterolisthesis of C3 on C4 and C4 on C5. Skull base and vertebrae: Multilevel moderate severe degenerative changes spine most prominent at the C6-C7 level. Associated multilevel severe osseous neural foraminal stenosis. No severe osseous central canal stenosis. No acute fracture. No aggressive appearing focal osseous lesion or focal pathologic process. Soft tissues and  spinal canal: No prevertebral fluid or swelling. No visible canal hematoma. Upper chest: Unremarkable. Other: None. IMPRESSION: 1. No acute intracranial abnormality. 2. No acute displaced fracture or traumatic listhesis of the cervical spine. 3. Right sphenoid sinus mucosal thickening with calcifications. Findings suggestive of chronic versus fungal sinusitis. Electronically Signed   By: Tish Frederickson M.D.   On: 06/21/2023 03:12    Pending Labs Unresulted Labs (From admission, onward)    None       Vitals/Pain Today's Vitals   06/21/23 0800 06/21/23 0851 06/21/23 0900 06/21/23 0947  BP: 135/69  113/69   Pulse: (!) 42  (!) 52 (!) 117  Resp: (!) 28  16  13  Temp:  98.3 F (36.8 C)    TempSrc:  Oral    SpO2: 95%  97% 99%  Weight:      Height:      PainSc:    8     Isolation Precautions No active isolations  Medications Medications  sertraline (ZOLOFT) tablet 50 mg (50 mg Oral Given 06/21/23 0946)  methocarbamol (ROBAXIN) tablet 500 mg (has no administration in time range)    Or  methocarbamol (ROBAXIN) 500 mg in dextrose 5 % 50 mL IVPB (has no administration in time range)  0.9 %  sodium chloride infusion (1,000 mLs Intravenous New Bag/Given 06/21/23 0633)  naloxone [facility]) injection 0.4 mg (has no administration in time range)  fentaNYL (SUBLIMAZE) injection 25 mcg (25 mcg Intravenous Given 06/21/23 0946)  lactated ringers bolus 1,000 mL (0 mLs Intravenous Stopped 06/21/23 0548)  fentaNYL (SUBLIMAZE) injection 50 mcg (50 mcg Intravenous Given 06/21/23 0351)  fentaNYL (SUBLIMAZE) injection 50 mcg (50 mcg Intravenous Given 06/21/23 0511)    Mobility Patient non-weight bearing to right leg.   Prior to fall patient ambulated with walker     Focused Assessments Right hip pain. GU:  Patient using purwik without difficulty.  Skin:  Intact  R Recommendations: See Admitting Provider Note  Report given to:   Additional Notes: Plan for surgery tomorrow.

## 2023-06-21 NOTE — Progress Notes (Signed)
Orthopedic Tech Progress Note Patient Details:  Frances Ortiz 16-Oct-1931 [id number]  [name], female   DOB: Dec 01, 1930, 89 y.o.   MRN: [id number] Patient doesn't meet criteria for ohf. Patient must be under 70 to get ohf. Trinna Post 06/21/2023, 9:11 AM

## 2023-06-21 NOTE — Progress Notes (Signed)
Patient seen and examined.  She was still in the emergency room.  Admitted early morning hours by Dr. Loney Loh.  See H&P and assessment plan for details.  Surgery scheduled for tomorrow 8/31 with Dr. Noreene Larsson.  N.p.o. past midnight.  She had left hip pain that responded to injection fentanyl earlier today.  She is from home and lives independent.  Anticipate need for SNF on discharge.  No new changes.  Same-day admit.  No charge visit.

## 2023-06-21 NOTE — ED Provider Notes (Signed)
[facility] Emergency Department Provider Note MRN:  [id number]  Arrival date & time: 06/21/23     Chief Complaint   Fall   History of Present Illness   Frances Ortiz is a 89 y.o. year-old female presents to the ED with chief complaint of fall.  She states that her walker got tripped up and she fell over sideways landing on her right elbow, and hip.  She did hit her head, but did not pass out.  She is not anticoagulated.  She complains of right groin pain that is worsened with movement.  History provided by patient.   Review of Systems  Pertinent positive and negative review of systems noted in HPI.    Physical Exam   Vitals:   06/21/23 0415 06/21/23 0430  BP: (!) 152/83   Pulse: (!) 129   Resp: 17   Temp:  98.3 F (36.8 C)  SpO2: 99%     CONSTITUTIONAL:  elderly, non-appearing, NAD NEURO:  Alert and oriented x 3, CN 3-12 grossly intact EYES:  eyes equal and reactive ENT/NECK:  Supple, no stridor  CARDIO:  normal rate, regular rhythm, appears well-perfused  PULM:  No respiratory distress, CTAB GI/GU:  non-distended,  MSK/SPINE:  No gross deformities, no edema, pain with right hip ROM SKIN:  no rash, atraumatic   *Additional and/or pertinent findings included in MDM below  Diagnostic and Interventional Summary    EKG Interpretation Date/Time:  Friday June 21 2023 02:32:44 EDT Ventricular Rate:  110 PR Interval:  182 QRS Duration:  87 QT Interval:  345 QTC Calculation: 467 R Axis:   -30  Text Interpretation: Sinus tachycardia Atrial premature complex Left axis deviation Confirmed by Ross Marcus ([id number]) on 06/21/2023 2:35:56 AM       Labs Reviewed  CBC - Abnormal; Notable for the following components:      Result Value   WBC 19.6 (*)    All other components within normal limits  BASIC METABOLIC PANEL - Abnormal; Notable for the following components:   Glucose, Bld 109 (*)    All other components within normal limits   SARS CORONAVIRUS 2 BY RT PCR    CT HEAD WO CONTRAST ( )  Final Result    CT Cervical Spine Wo Contrast  Final Result    DG HIP UNILAT WITH PELVIS 2-3 VIEWS RIGHT  Final Result    DG Chest Port 1 View  Final Result      Medications  lactated ringers bolus 1,000 mL (1,000 mLs Intravenous New Bag/Given 06/21/23 0350)  fentaNYL (SUBLIMAZE) injection 50 mcg (50 mcg Intravenous Given 06/21/23 0351)     Procedures  /  Critical Care Procedures  ED Course and Medical Decision Making  I have reviewed the triage vital signs, the nursing notes, and pertinent available records from the EMR.  Social Determinants Affecting Complexity of Care: Patient has no clinically significant social determinants affecting this chief complaint..   ED Course:    Medical Decision Making Patient here with mechanical fall from standing.  Her walker got stuck on a piece of carpet and she tumbled to the floor on her right sided.  She complains of groin pain.  Concern for R hip fracture.  Will check imaging.  Patient was tachycardic during last assessment.  She states that she feels anxious.  She reports having only minimal pain with movement.  Amount and/or Complexity of Data Reviewed Labs: ordered. Radiology: ordered and independent interpretation performed.    Details: Right  femoral neck fx seen ECG/medicine tests: ordered.  Risk Prescription drug management. Decision regarding hospitalization.         Consultants: I consulted with Hospitalist, Dr. Loney Loh, who is appreciated for admitting. and I consulted with Dr. Carola Frost, who recommends NPO and hospitalist admission.   Treatment and Plan: Patient's exam and diagnostic results are concerning for right femoral neck fx.  Feel that patient will need admission to the hospital for further treatment and evaluation.  Patient discussed with attending physician, Dr. Wilkie Aye, who agrees with the plan.  Final Clinical Impressions(s) / ED Diagnoses      ICD-10-CM   1. Closed fracture of neck of right femur, initial encounter [facility])  S72.001A       ED Discharge Orders     None         Discharge Instructions Discussed with and Provided to Patient:   Discharge Instructions   None      Roxy Horseman, PA-C 06/21/23 0451    Shon Baton, MD 06/22/23 [id number]

## 2023-06-21 NOTE — Progress Notes (Signed)
Initial Nutrition Assessment  DOCUMENTATION CODES:   Not applicable  INTERVENTION:  Multivitamin w/ minerals daily Ensure Enlive po BID, each supplement provides 350 kcal and 20 grams of protein.  NUTRITION DIAGNOSIS:   Increased nutrient needs related to hip fracture as evidenced by estimated needs.  GOAL:   Patient will meet greater than or equal to 90% of their needs  MONITOR:   PO intake, Supplement acceptance, I & O's, Labs  REASON FOR ASSESSMENT:   Consult Hip fracture protocol  ASSESSMENT:   89 y.o. female presented to the ED after a mechanical fall at home. PMH includes GERD, HLD, cecal cancer s/p colectomy. Pt admitted with R femoral neck fracture.   Pt out of room at time of RD visit. Plan for OR tomorrow to repair hip fracture. Limited prior weight history, over the past year pt weight appears to be stable.   RD to order oral nutrition supplements post-op due to increase needs for healing.   Medications reviewed. Labs reviewed: Sodium 139, Potassium 3.7, BUN 22, Creatinine 0.62   NUTRITION - FOCUSED PHYSICAL EXAM:  Deferred to follow-up.   Diet Order:   Diet Order             Diet NPO time specified Except for: Sips with Meds  Diet effective midnight           Diet regular Fluid consistency: Thin  Diet effective now                  EDUCATION NEEDS:  No education needs have been identified at this time  Skin:  Skin Assessment: Reviewed RN Assessment  Last BM:  Unknown  Height:  Ht Readings from Last 1 Encounters:  06/21/23 4\' 11"  (1.499 m)   Weight:  Wt Readings from Last 1 Encounters:  06/21/23 61 kg   BMI:  Body mass index is 27.16 kg/m.  Estimated Nutritional Needs:  Kcal:  1600-1800 Protein:  80-100 grams Fluid:  >/= 1.6 L   Kirby Crigler RD, LDN Clinical Dietitian See [facility] for contact information.

## 2023-06-21 NOTE — Anesthesia Procedure Notes (Signed)
Anesthesia Regional Block: Femoral nerve block   Pre-Anesthetic Checklist: , timeout performed,  Correct Patient, Correct Site, Correct Laterality,  Correct Procedure, Correct Position, site marked,  Risks and benefits discussed,  Surgical consent,  Pre-op evaluation,  At surgeon's request and post-op pain management  Laterality: Right  Prep: chloraprep       Needles:  Injection technique: Single-shot  Needle Type: Echogenic Needle     Needle Length: 9cm  Needle Gauge: 21     Additional Needles:   Procedures:,,,, ultrasound used (permanent image in chart),,    Narrative:  Start time: 06/21/2023 2:14 PM End time: 06/21/2023 2:20 PM Injection made incrementally with aspirations every 5 mL.  Performed by: Personally  Anesthesiologist: Collene Schlichter, MD  Additional Notes: No pain on injection. No increased resistance to injection. Injection made in 5cc increments.  Good needle visualization.  Patient tolerated procedure well.

## 2023-06-21 NOTE — H&P (Signed)
History and Physical    BRENETTA Ortiz FAO:[id number] DOB: 1931/02/19 DOA: 06/21/2023  PCP: Irven Coe, MD  Patient coming from: Home  Chief Complaint: Right hip pain  HPI: Frances Ortiz is a 89 y.o. female with medical history significant of hyperlipidemia, GERD, anxiety/depression, cecal cancer status post right colectomy presented to the ED complaining of right hip pain after a mechanical fall.  She did hit her head but did not pass out.  Not anticoagulated.  Tachycardic to 120-130s and slightly hypertensive.  Labs notable for WBC 19.6.  Chest x-ray showing no active disease.  X-ray of right hip/pelvis showing acute right femoral neck fracture.  CT head/C-spine negative for acute finding.  Does show right sphenoid sinus mucosal thickening with calcifications and radiologist thinks this could be suggestive of chronic versus fungal sinusitis. ED PA consulted Dr. Carola Frost with orthopedics who recommended keeping the patient n.p.o. and hospitalist admission.  Patient was given fentanyl and 1 L LR in the ED.  [facility] called to admit.  Patient states she was walking to the bathroom tonight using her walker which got caught on a piece of rug causing her to trip and fall on her right side.  Since after the fall she is having severe pain in her right hip and was not able to get up from the floor on her own.  Her cell phone was with her and she called 911 for help.  She does recall hitting her head against the wall but not too hard and denies loss of consciousness.  She does not take any blood thinners.  Denies fevers, cough, shortness breath, chest pain, vomiting, abdominal pain, diarrhea, or dysuria.  Denies headaches, eye pain/pressure, diplopia, or facial pain.  She is anxious about having hip surgery.  Review of Systems:  Review of Systems  All other systems reviewed and are negative.   Past Medical History:  Diagnosis Date   Anxiety    GERD (gastroesophageal reflux disease)    Heart murmur    HLD  (hyperlipidemia)    Intestinal obstruction (HCC)    Osteoporosis    PONV (postoperative nausea and vomiting)    Skin cancer    melanoma, basal cell, and squamous    Past Surgical History:  Procedure Laterality Date   BREAST BIOPSY     bilateral   BREAST EXCISIONAL BIOPSY Right 30 years ago   fibroadenoma   BREAST EXCISIONAL BIOPSY Left 40 years ago   sclerosis thickening   CATARACT EXTRACTION W/ INTRAOCULAR LENS  IMPLANT, BILATERAL Bilateral    CESAREAN SECTION     x 2   CHOLECYSTECTOMY N/A 11/27/2012   Procedure: LAPAROSCOPIC CHOLECYSTECTOMY WITH INTRAOPERATIVE CHOLANGIOGRAM;  Surgeon: Robyne Askew, MD;  Location: [facility] OR;  Service: General;  Laterality: N/A;   COLECTOMY Right 10/07/2017   right   COLONOSCOPY  2005   COLONOSCOPY  08/2017   "found mass in cecum"   DILATION AND CURETTAGE OF UTERUS     ESOPHAGOGASTRODUODENOSCOPY     IR KYPHO EA ADDL LEVEL THORACIC OR LUMBAR  02/27/2023   IR KYPHO LUMBAR INC FX REDUCE BONE BX UNI/BIL CANNULATION INC/IMAGING  02/27/2023   LAPAROSCOPIC RIGHT COLECTOMY Right 10/07/2017   Procedure: LAPAROSCOPIC RIGHT COLECTOMY ERAS PATHWAY;  Surgeon: Griselda Miner, MD;  Location: [facility] OR;  Service: General;  Laterality: Right;   SKIN CANCER EXCISION     TONSILLECTOMY       reports that she has never smoked. She has never used smokeless tobacco. She  reports that she does not drink alcohol and does not use drugs.  Allergies  Allergen Reactions   Aristocort [Triamcinolone] Other (See Comments)    Emotional instability   Cortisone Other (See Comments)    Emotional instability   Erythromycin Other (See Comments)    Stomach pain    Other Other (See Comments)    Epinephrine in novocain makes heart race.   Hydrocodone-Acetaminophen Rash    Family History  Problem Relation Age of Onset   Pneumonia Mother    Colon cancer Neg Hx    Colon polyps Neg Hx    Rectal cancer Neg Hx    Stomach cancer Neg Hx     Prior to Admission medications    Medication Sig Start Date End Date Taking? Authorizing Provider  Cholecalciferol (VITAMIN D3) 2000 UNITS capsule Take 2,000 Units by mouth daily.    [provider]  melatonin 5 MG TABS Take 1 tablet (5 mg total) by mouth at bedtime as needed. 03/01/23   Lorin Glass, MD  Multiple Vitamin (MULTIVITAMIN) tablet Take 1 tablet by mouth daily.     [provider]  polyethylene glycol (MIRALAX / GLYCOLAX) 17 g packet Take 17 g by mouth daily as needed. 03/01/23   Dahal, Melina Schools, MD  senna-docusate (SENOKOT-S) 8.6-50 MG tablet Take 1 tablet by mouth 2 (two) times daily. 03/01/23   Lorin Glass, MD  sertraline (ZOLOFT) 50 MG tablet Take 50 mg by mouth daily.    [provider]    Physical Exam: Vitals:   06/21/23 0300 06/21/23 0330 06/21/23 0415 06/21/23 0430  BP: (!) 160/80 (!) 147/82 (!) 152/83   Pulse: (!) 122 (!) 126 (!) 129   Resp: 17 15 17    Temp:    98.3 F (36.8 C)  TempSrc:    Oral  SpO2:  99% 99%   Weight:      Height:        Physical Exam Vitals reviewed.  Constitutional:      General: She is not in acute distress. HENT:     Head: Normocephalic and atraumatic.  Eyes:     Extraocular Movements: Extraocular movements intact.  Cardiovascular:     Rate and Rhythm: Regular rhythm. Tachycardia present.     Pulses: Normal pulses.  Pulmonary:     Effort: Pulmonary effort is normal. No respiratory distress.     Breath sounds: Normal breath sounds. No wheezing or rales.  Abdominal:     General: Bowel sounds are normal. There is no distension.     Palpations: Abdomen is soft.     Tenderness: There is no abdominal tenderness.  Musculoskeletal:     Cervical back: Normal range of motion.     Right lower leg: No edema.     Left lower leg: No edema.     Comments: Right lower extremity neurovascularly intact  Skin:    General: Skin is warm and dry.  Neurological:     General: No focal deficit present.     Mental Status: She is alert and oriented to  person, place, and time.     Labs on Admission: I have personally reviewed following labs and imaging studies  CBC: Recent Labs  Lab 06/21/23 0257  WBC 19.6*  HGB 13.6  HCT 42.0  MCV 96.3  PLT 272   Basic Metabolic Panel: Recent Labs  Lab 06/21/23 0257  NA 139  K 3.7  CL 104  CO2 24  GLUCOSE 109*  BUN 22  CREATININE 0.62  CALCIUM 9.0   GFR: Estimated Creatinine Clearance: 37.1 mL/min (by C-G formula based on SCr of 0.62 mg/dL). Liver Function Tests: No results for input(s): "AST", "ALT", "ALKPHOS", "BILITOT", "PROT", "ALBUMIN" in the last 168 hours. No results for input(s): "LIPASE", "AMYLASE" in the last 168 hours. No results for input(s): "AMMONIA" in the last 168 hours. Coagulation Profile: No results for input(s): "INR", "PROTIME" in the last 168 hours. Cardiac Enzymes: No results for input(s): "CKTOTAL", "CKMB", "CKMBINDEX", "TROPONINI" in the last 168 hours. BNP (last 3 results) No results for input(s): "PROBNP" in the last 8760 hours. HbA1C: No results for input(s): "HGBA1C" in the last 72 hours. CBG: No results for input(s): "GLUCAP" in the last 168 hours. Lipid Profile: No results for input(s): "CHOL", "HDL", "LDLCALC", "TRIG", "CHOLHDL", "LDLDIRECT" in the last 72 hours. Thyroid Function Tests: No results for input(s): "TSH", "T4TOTAL", "FREET4", "T3FREE", "THYROIDAB" in the last 72 hours. Anemia Panel: No results for input(s): "VITAMINB12", "FOLATE", "FERRITIN", "TIBC", "IRON", "RETICCTPCT" in the last 72 hours. Urine analysis: No results found for: "COLORURINE", "APPEARANCEUR", "LABSPEC", "PHURINE", "GLUCOSEU", "HGBUR", "BILIRUBINUR", "KETONESUR", "PROTEINUR", "UROBILINOGEN", "NITRITE", "LEUKOCYTESUR"  Radiological Exams on Admission: DG HIP UNILAT WITH PELVIS 2-3 VIEWS RIGHT  Result Date: 06/21/2023 CLINICAL DATA:  [id number] Fall [id number] EXAM: DG HIP (WITH OR WITHOUT PELVIS) 2-3V RIGHT COMPARISON:  CT abdomen pelvis 10/02/2017. FINDINGS: Shortening of  the right femoral neck with associated poorly visualized lucency along the neck/head consistent with an acute fracture. No right hip dislocation peer frontal view of the left hip demonstrates no acute fracture or dislocation. Acute displaced fracture or diastasis of the bones of the pelvis. There is no evidence of severe arthropathy or other focal bone abnormality. Kyphoplasty noted of the lumbar spine. IMPRESSION: Acute right femoral neck/neck fracture poorly visualized. Electronically Signed   By: Tish Frederickson M.D.   On: 06/21/2023 03:16   DG Chest Port 1 View  Result Date: 06/21/2023 CLINICAL DATA:  fall EXAM: PORTABLE CHEST 1 VIEW COMPARISON:  CT abdomen pelvis 10/02/2017 FINDINGS: The heart and mediastinal contours are within normal limits. Aortic calcification. Hyperinflation of the lungs. No focal consolidation. Coarsened interstitial markings with no overt pulmonary edema. No pleural effusion. No pneumothorax. No acute osseous abnormality. IMPRESSION: 1. No active disease. 2. Aortic Atherosclerosis (ICD10-I70.0) and Emphysema (ICD10-J43.9). Electronically Signed   By: Tish Frederickson M.D.   On: 06/21/2023 03:14   CT HEAD WO CONTRAST ( )  Result Date: 06/21/2023 CLINICAL DATA:  Polytrauma, blunt EXAM: CT HEAD WITHOUT CONTRAST CT CERVICAL SPINE WITHOUT CONTRAST TECHNIQUE: Multidetector CT imaging of the head and cervical spine was performed following the standard protocol without intravenous contrast. Multiplanar CT image reconstructions of the cervical spine were also generated. RADIATION DOSE REDUCTION: This exam was performed according to the departmental dose-optimization program which includes automated exposure control, adjustment of the mA and/or kV according to patient size and/or use of iterative reconstruction technique. COMPARISON:  CT head 12/09/2022 FINDINGS: CT HEAD FINDINGS Brain: Cerebral ventricle sizes are concordant with the degree of cerebral volume loss. Patchy and confluent  areas of decreased attenuation are noted throughout the deep and periventricular white matter of the cerebral hemispheres bilaterally, compatible with chronic microvascular ischemic disease. No evidence of large-territorial acute infarction. No parenchymal hemorrhage. No mass lesion. No extra-axial collection. No mass effect or midline shift. No hydrocephalus. Basilar cisterns are patent. Vascular: No hyperdense vessel. Skull: No acute fracture or focal lesion. Sinuses/Orbits: Right sphenoid sinus mucosal thickening with calcifications. Paranasal sinuses and mastoid air cells are clear.  Bilateral lens replacement. Otherwise the orbits are unremarkable. Other: None. CT CERVICAL SPINE FINDINGS Alignment: Grade 1 anterolisthesis of C3 on C4 and C4 on C5. Skull base and vertebrae: Multilevel moderate severe degenerative changes spine most prominent at the C6-C7 level. Associated multilevel severe osseous neural foraminal stenosis. No severe osseous central canal stenosis. No acute fracture. No aggressive appearing focal osseous lesion or focal pathologic process. Soft tissues and spinal canal: No prevertebral fluid or swelling. No visible canal hematoma. Upper chest: Unremarkable. Other: None. IMPRESSION: 1. No acute intracranial abnormality. 2. No acute displaced fracture or traumatic listhesis of the cervical spine. 3. Right sphenoid sinus mucosal thickening with calcifications. Findings suggestive of chronic versus fungal sinusitis. Electronically Signed   By: Tish Frederickson M.D.   On: 06/21/2023 03:12   CT Cervical Spine Wo Contrast  Result Date: 06/21/2023 CLINICAL DATA:  Polytrauma, blunt EXAM: CT HEAD WITHOUT CONTRAST CT CERVICAL SPINE WITHOUT CONTRAST TECHNIQUE: Multidetector CT imaging of the head and cervical spine was performed following the standard protocol without intravenous contrast. Multiplanar CT image reconstructions of the cervical spine were also generated. RADIATION DOSE REDUCTION: This exam  was performed according to the departmental dose-optimization program which includes automated exposure control, adjustment of the mA and/or kV according to patient size and/or use of iterative reconstruction technique. COMPARISON:  CT head 12/09/2022 FINDINGS: CT HEAD FINDINGS Brain: Cerebral ventricle sizes are concordant with the degree of cerebral volume loss. Patchy and confluent areas of decreased attenuation are noted throughout the deep and periventricular white matter of the cerebral hemispheres bilaterally, compatible with chronic microvascular ischemic disease. No evidence of large-territorial acute infarction. No parenchymal hemorrhage. No mass lesion. No extra-axial collection. No mass effect or midline shift. No hydrocephalus. Basilar cisterns are patent. Vascular: No hyperdense vessel. Skull: No acute fracture or focal lesion. Sinuses/Orbits: Right sphenoid sinus mucosal thickening with calcifications. Paranasal sinuses and mastoid air cells are clear. Bilateral lens replacement. Otherwise the orbits are unremarkable. Other: None. CT CERVICAL SPINE FINDINGS Alignment: Grade 1 anterolisthesis of C3 on C4 and C4 on C5. Skull base and vertebrae: Multilevel moderate severe degenerative changes spine most prominent at the C6-C7 level. Associated multilevel severe osseous neural foraminal stenosis. No severe osseous central canal stenosis. No acute fracture. No aggressive appearing focal osseous lesion or focal pathologic process. Soft tissues and spinal canal: No prevertebral fluid or swelling. No visible canal hematoma. Upper chest: Unremarkable. Other: None. IMPRESSION: 1. No acute intracranial abnormality. 2. No acute displaced fracture or traumatic listhesis of the cervical spine. 3. Right sphenoid sinus mucosal thickening with calcifications. Findings suggestive of chronic versus fungal sinusitis. Electronically Signed   By: Tish Frederickson M.D.   On: 06/21/2023 03:12    EKG: Independently reviewed.   Sinus tachycardia, no acute ischemic changes.  Assessment and Plan  Acute right femoral neck fracture Secondary to a mechanical fall.  Not anticoagulated.  Orthopedics consulted.  Keep n.p.o., nonweightbearing, and continue pain management.  Sinus tachycardia Likely related to pain and anxiety.  Continue IV fluid hydration.  Continue management of pain and anxiety.  Elevated blood pressure readings Slightly hypertensive with systolic in the 140s to 150s.  No documented history of hypertension.  Continue pain management and monitor blood pressure closely.  ?Chronic versus fungal sinusitis CT showing right sphenoid sinus mucosal thickening with calcifications which radiologist thinks could be due to chronic versus fungal sinusitis.  WBC count 19.6, afebrile.  Patient is not endorsing any headaches, eye pain/pressure, diplopia, or facial pain.  Will  need MRI for further evaluation after her hip surgery.  Repeat labs this morning to check WBC count.  Anxiety/depression Continue Zoloft.  DVT prophylaxis: SCDs Code Status: DNR (discussed with the patient) Family Communication: No family available at this time. Consults called: Orthopedics Level of care: Progressive Care Unit Admission status: It is my clinical opinion that admission to INPATIENT is reasonable and necessary because of the expectation that this patient will require hospital care that crosses at least 2 midnights to treat this condition based on the medical complexity of the problems presented.  Given the aforementioned information, the predictability of an adverse outcome is felt to be significant.  John Giovanni MD [facility]  If 7PM-7AM, please contact night-coverage [email or web address]  06/21/2023, 5:39 AM

## 2023-06-22 ENCOUNTER — Inpatient Hospital Stay (HOSPITAL_COMMUNITY): Payer: Medicare Other

## 2023-06-22 ENCOUNTER — Encounter (HOSPITAL_COMMUNITY): Payer: Self-pay | Admitting: Internal Medicine

## 2023-06-22 ENCOUNTER — Other Ambulatory Visit: Payer: Self-pay

## 2023-06-22 ENCOUNTER — Inpatient Hospital Stay (HOSPITAL_COMMUNITY): Payer: Medicare Other | Admitting: Anesthesiology

## 2023-06-22 ENCOUNTER — Encounter (HOSPITAL_COMMUNITY)
Admission: EM | Disposition: A | Discharge status: Skilled Nursing Facility | Payer: Self-pay | Source: Home / Self Care | Attending: Internal Medicine

## 2023-06-22 DIAGNOSIS — F419 Anxiety disorder, unspecified: Secondary | ICD-10-CM | POA: Diagnosis not present

## 2023-06-22 DIAGNOSIS — E785 Hyperlipidemia, unspecified: Secondary | ICD-10-CM

## 2023-06-22 DIAGNOSIS — S72001A Fracture of unspecified part of neck of right femur, initial encounter for closed fracture: Secondary | ICD-10-CM

## 2023-06-22 HISTORY — PX: ANTERIOR APPROACH HEMI HIP ARTHROPLASTY: SHX6690

## 2023-06-22 LAB — CBC WITH DIFFERENTIAL/PLATELET
Abs Immature Granulocytes: 0.11 10*3/uL — ABNORMAL HIGH (ref 0.00–0.07)
Basophils Absolute: 0 10*3/uL (ref 0.0–0.1)
Basophils Relative: 0 %
Eosinophils Absolute: 0 10*3/uL (ref 0.0–0.5)
Eosinophils Relative: 0 %
HCT: 38.7 % (ref 36.0–46.0)
Hemoglobin: 12.6 g/dL (ref 12.0–15.0)
Immature Granulocytes: 1 %
Lymphocytes Relative: 3 %
Lymphs Abs: 0.6 10*3/uL — ABNORMAL LOW (ref 0.7–4.0)
MCH: 32.3 pg (ref 26.0–34.0)
MCHC: 32.6 g/dL (ref 30.0–36.0)
MCV: 99.2 fL (ref 80.0–100.0)
Monocytes Absolute: 0.5 10*3/uL (ref 0.1–1.0)
Monocytes Relative: 2 %
Neutro Abs: 18.5 10*3/uL — ABNORMAL HIGH (ref 1.7–7.7)
Neutrophils Relative %: 94 %
Platelets: 217 10*3/uL (ref 150–400)
RBC: 3.9 MIL/uL (ref 3.87–5.11)
RDW: 13.2 % (ref 11.5–15.5)
WBC: 19.6 10*3/uL — ABNORMAL HIGH (ref 4.0–10.5)
nRBC: 0 % (ref 0.0–0.2)

## 2023-06-22 LAB — BASIC METABOLIC PANEL
Anion gap: 15 (ref 5–15)
BUN: 22 mg/dL (ref 8–23)
CO2: 23 mmol/L (ref 22–32)
Calcium: 8.8 mg/dL — ABNORMAL LOW (ref 8.9–10.3)
Chloride: 101 mmol/L (ref 98–111)
Creatinine, Ser: 0.61 mg/dL (ref 0.44–1.00)
GFR, Estimated: >60 mL/min (ref >60)
Glucose, Bld: 134 mg/dL — ABNORMAL HIGH (ref 70–99)
Potassium: 4.3 mmol/L (ref 3.5–5.1)
Sodium: 139 mmol/L (ref 135–145)

## 2023-06-22 LAB — TYPE AND SCREEN
ABO/RH(D): O POS
Antibody Screen: NEGATIVE

## 2023-06-22 LAB — MRSA NEXT GEN BY PCR, NASAL: MRSA by PCR Next Gen: NOT DETECTED

## 2023-06-22 LAB — PROCALCITONIN: Procalcitonin: <0.1 ng/mL

## 2023-06-22 LAB — BRAIN NATRIURETIC PEPTIDE: B Natriuretic Peptide: 222.5 pg/mL — ABNORMAL HIGH (ref 0.0–100.0)

## 2023-06-22 SURGERY — HEMIARTHROPLASTY, HIP, DIRECT ANTERIOR APPROACH, FOR FRACTURE
Anesthesia: General | Laterality: Right

## 2023-06-22 MED ORDER — OXYCODONE HCL 5 MG PO TABS
ORAL_TABLET | ORAL | Status: AC
  Filled 2023-06-22: qty 1

## 2023-06-22 MED ORDER — MENTHOL 3 MG MT LOZG: 1.0000 | LOZENGE | OROMUCOSAL | Status: DC | PRN

## 2023-06-22 MED ORDER — TRANEXAMIC ACID 1000 MG/10ML IV SOLN
INTRAVENOUS | Status: DC | PRN
  Administered 2023-06-22: 1000 mg via INTRAVENOUS

## 2023-06-22 MED ORDER — SUGAMMADEX SODIUM 200 MG/2ML IV SOLN
INTRAVENOUS | Status: DC | PRN
  Administered 2023-06-22: 200 mg via INTRAVENOUS

## 2023-06-22 MED ORDER — PHENYLEPHRINE 80 MCG/ML (10ML) SYRINGE FOR IV PUSH (FOR BLOOD PRESSURE SUPPORT)
PREFILLED_SYRINGE | INTRAVENOUS | Status: AC
  Filled 2023-06-22: qty 10

## 2023-06-22 MED ORDER — FENTANYL CITRATE (PF) 250 MCG/5ML IJ SOLN
INTRAMUSCULAR | Status: DC | PRN
  Administered 2023-06-22: 25 ug via INTRAVENOUS
  Administered 2023-06-22: 50 ug via INTRAVENOUS
  Administered 2023-06-22 (×3): 25 ug via INTRAVENOUS

## 2023-06-22 MED ORDER — ACETAMINOPHEN 500 MG PO TABS
500.0000 mg | ORAL_TABLET | Freq: Four times a day (QID) | ORAL | Status: AC
  Administered 2023-06-22 – 2023-06-23 (×3): 500 mg via ORAL
  Filled 2023-06-22 (×2): qty 1

## 2023-06-22 MED ORDER — OXYCODONE HCL 5 MG/5ML PO SOLN: 5.0000 mg | Freq: Once | ORAL | Status: AC | PRN

## 2023-06-22 MED ORDER — ALUM & MAG HYDROXIDE-SIMETH 200-200-20 MG/5ML PO SUSP: 30.0000 mL | ORAL | Status: DC | PRN

## 2023-06-22 MED ORDER — ONDANSETRON HCL 4 MG/2ML IJ SOLN: 4.0000 mg | Freq: Four times a day (QID) | INTRAMUSCULAR | Status: DC | PRN

## 2023-06-22 MED ORDER — PROPOFOL 10 MG/ML IV BOLUS
INTRAVENOUS | Status: DC | PRN
  Administered 2023-06-22: 75 ug/kg/min via INTRAVENOUS
  Administered 2023-06-22: 60 mg via INTRAVENOUS
  Administered 2023-06-22: 20 mg via INTRAVENOUS

## 2023-06-22 MED ORDER — PHENOL 1.4 % MT LIQD: 1.0000 | OROMUCOSAL | Status: DC | PRN

## 2023-06-22 MED ORDER — HYDRALAZINE HCL 20 MG/ML IJ SOLN: 10.0000 mg | Freq: Four times a day (QID) | INTRAMUSCULAR | Status: DC | PRN

## 2023-06-22 MED ORDER — FERROUS SULFATE 325 (65 FE) MG PO TABS
325.0000 mg | ORAL_TABLET | Freq: Three times a day (TID) | ORAL | Status: DC
  Administered 2023-06-22 – 2023-06-25 (×9): 325 mg via ORAL
  Filled 2023-06-22 (×10): qty 1

## 2023-06-22 MED ORDER — ORAL CARE MOUTH RINSE: 15.0000 mL | Freq: Once | OROMUCOSAL | Status: AC

## 2023-06-22 MED ORDER — CEFAZOLIN SODIUM-DEXTROSE 2-4 GM/100ML-% IV SOLN
2.0000 g | INTRAVENOUS | Status: AC
  Administered 2023-06-22: 2 g via INTRAVENOUS

## 2023-06-22 MED ORDER — CEFAZOLIN SODIUM-DEXTROSE 2-4 GM/100ML-% IV SOLN
2.0000 g | Freq: Four times a day (QID) | INTRAVENOUS | Status: AC
  Administered 2023-06-22 (×2): 2 g via INTRAVENOUS
  Filled 2023-06-22 (×2): qty 100

## 2023-06-22 MED ORDER — FENTANYL CITRATE (PF) 250 MCG/5ML IJ SOLN
INTRAMUSCULAR | Status: AC
  Filled 2023-06-22: qty 5

## 2023-06-22 MED ORDER — SODIUM CHLORIDE 0.9 % IR SOLN
Status: DC | PRN
  Administered 2023-06-22: 3000 mL
  Administered 2023-06-22: 1000 mL

## 2023-06-22 MED ORDER — POLYETHYLENE GLYCOL 3350 17 G PO PACK: 17.0000 g | PACK | Freq: Every day | ORAL | Status: DC | PRN

## 2023-06-22 MED ORDER — LACTATED RINGERS IV SOLN: INTRAVENOUS | Status: DC

## 2023-06-22 MED ORDER — CEFAZOLIN SODIUM-DEXTROSE 2-4 GM/100ML-% IV SOLN
INTRAVENOUS | Status: AC
  Filled 2023-06-22: qty 100

## 2023-06-22 MED ORDER — CHLORHEXIDINE GLUCONATE 0.12 % MT SOLN
OROMUCOSAL | Status: AC
  Administered 2023-06-22: 15 mL via OROMUCOSAL
  Filled 2023-06-22: qty 15

## 2023-06-22 MED ORDER — PSYLLIUM 95 % PO PACK
1.0000 | PACK | Freq: Every day | ORAL | Status: DC
  Administered 2023-06-22 – 2023-06-25 (×4): 1 via ORAL
  Filled 2023-06-22 (×4): qty 1

## 2023-06-22 MED ORDER — BISACODYL 10 MG RE SUPP: 10.0000 mg | Freq: Every day | RECTAL | Status: DC | PRN

## 2023-06-22 MED ORDER — TRANEXAMIC ACID-NACL 1000-0.7 MG/100ML-% IV SOLN: 1000.0000 mg | INTRAVENOUS | Status: DC

## 2023-06-22 MED ORDER — MAGNESIUM CITRATE PO SOLN: 1.0000 | Freq: Once | ORAL | Status: DC | PRN

## 2023-06-22 MED ORDER — ACETAMINOPHEN 325 MG PO TABS
325.0000 mg | ORAL_TABLET | Freq: Four times a day (QID) | ORAL | Status: DC | PRN
  Administered 2023-06-23: 650 mg via ORAL
  Filled 2023-06-22: qty 2

## 2023-06-22 MED ORDER — POTASSIUM CHLORIDE IN NACL 20-0.9 MEQ/L-% IV SOLN: INTRAVENOUS | Status: AC

## 2023-06-22 MED ORDER — POTASSIUM CHLORIDE IN NACL 20-0.9 MEQ/L-% IV SOLN
INTRAVENOUS | Status: DC
  Filled 2023-06-22: qty 1000

## 2023-06-22 MED ORDER — ONDANSETRON HCL 4 MG PO TABS
4.0000 mg | ORAL_TABLET | Freq: Four times a day (QID) | ORAL | Status: DC | PRN
  Administered 2023-06-24: 4 mg via ORAL
  Filled 2023-06-22: qty 1

## 2023-06-22 MED ORDER — ONDANSETRON HCL 4 MG/2ML IJ SOLN
4.0000 mg | Freq: Four times a day (QID) | INTRAMUSCULAR | Status: AC | PRN
  Administered 2023-06-22: 4 mg via INTRAVENOUS

## 2023-06-22 MED ORDER — PROPOFOL 10 MG/ML IV BOLUS
INTRAVENOUS | Status: AC
  Filled 2023-06-22: qty 20

## 2023-06-22 MED ORDER — ROCURONIUM BROMIDE 10 MG/ML (PF) SYRINGE
PREFILLED_SYRINGE | INTRAVENOUS | Status: DC | PRN
  Administered 2023-06-22: 10 mg via INTRAVENOUS
  Administered 2023-06-22: 50 mg via INTRAVENOUS

## 2023-06-22 MED ORDER — KETAMINE HCL 10 MG/ML IJ SOLN
INTRAMUSCULAR | Status: DC | PRN
Start: 2023-06-22 — End: 2023-06-22
  Administered 2023-06-22: 10 mg via INTRAVENOUS

## 2023-06-22 MED ORDER — PHENYLEPHRINE 80 MCG/ML (10ML) SYRINGE FOR IV PUSH (FOR BLOOD PRESSURE SUPPORT)
PREFILLED_SYRINGE | INTRAVENOUS | Status: DC | PRN
  Administered 2023-06-22: 160 ug via INTRAVENOUS
  Administered 2023-06-22: 80 ug via INTRAVENOUS

## 2023-06-22 MED ORDER — BUPIVACAINE HCL (PF) 0.25 % IJ SOLN
INTRAMUSCULAR | Status: AC
  Filled 2023-06-22: qty 30

## 2023-06-22 MED ORDER — TRAMADOL HCL 50 MG PO TABS
50.0000 mg | ORAL_TABLET | Freq: Four times a day (QID) | ORAL | Status: DC | PRN
  Administered 2023-06-23 – 2023-06-24 (×4): 50 mg via ORAL
  Filled 2023-06-22 (×4): qty 1

## 2023-06-22 MED ORDER — METHOCARBAMOL 500 MG PO TABS
500.0000 mg | ORAL_TABLET | Freq: Three times a day (TID) | ORAL | Status: DC | PRN
  Administered 2023-06-24: 500 mg via ORAL
  Filled 2023-06-22 (×2): qty 1

## 2023-06-22 MED ORDER — CHLORHEXIDINE GLUCONATE 0.12 % MT SOLN: 15.0000 mL | Freq: Once | OROMUCOSAL | Status: AC

## 2023-06-22 MED ORDER — KETAMINE HCL 50 MG/5ML IJ SOSY
PREFILLED_SYRINGE | INTRAMUSCULAR | Status: AC
  Filled 2023-06-22: qty 5

## 2023-06-22 MED ORDER — PHENYLEPHRINE HCL-NACL 20-0.9 MG/250ML-% IV SOLN
INTRAVENOUS | Status: DC | PRN
Start: 2023-06-22 — End: 2023-06-22
  Administered 2023-06-22: 15 ug/min via INTRAVENOUS

## 2023-06-22 MED ORDER — PROPOFOL 1000 MG/100ML IV EMUL
INTRAVENOUS | Status: AC
  Filled 2023-06-22: qty 100

## 2023-06-22 MED ORDER — METOPROLOL TARTRATE 50 MG PO TABS
50.0000 mg | ORAL_TABLET | Freq: Two times a day (BID) | ORAL | Status: DC
  Administered 2023-06-22 – 2023-06-25 (×7): 50 mg via ORAL
  Filled 2023-06-22 (×7): qty 1

## 2023-06-22 MED ORDER — ENOXAPARIN SODIUM 40 MG/0.4ML IJ SOSY
40.0000 mg | PREFILLED_SYRINGE | INTRAMUSCULAR | Status: DC
  Administered 2023-06-23 – 2023-06-25 (×3): 40 mg via SUBCUTANEOUS
  Filled 2023-06-22 (×3): qty 0.4

## 2023-06-22 MED ORDER — TRANEXAMIC ACID-NACL 1000-0.7 MG/100ML-% IV SOLN
1000.0000 mg | Freq: Once | INTRAVENOUS | Status: AC
  Administered 2023-06-22: 1000 mg via INTRAVENOUS
  Filled 2023-06-22: qty 100

## 2023-06-22 MED ORDER — POVIDONE-IODINE 10 % EX SWAB
2.0000 | Freq: Once | CUTANEOUS | Status: AC
  Administered 2023-06-22: 2 via TOPICAL

## 2023-06-22 MED ORDER — DEXAMETHASONE SODIUM PHOSPHATE 10 MG/ML IJ SOLN
INTRAMUSCULAR | Status: AC
  Filled 2023-06-22: qty 1

## 2023-06-22 MED ORDER — METHOCARBAMOL 1000 MG/10ML IJ SOLN: 500.0000 mg | Freq: Three times a day (TID) | INTRAVENOUS | Status: DC | PRN

## 2023-06-22 MED ORDER — OXYCODONE HCL 5 MG PO TABS
5.0000 mg | ORAL_TABLET | Freq: Once | ORAL | Status: AC | PRN
  Administered 2023-06-22: 5 mg via ORAL

## 2023-06-22 MED ORDER — LIDOCAINE 2% (20 MG/ML) 5 ML SYRINGE
INTRAMUSCULAR | Status: AC
  Filled 2023-06-22: qty 5

## 2023-06-22 MED ORDER — ONDANSETRON HCL 4 MG/2ML IJ SOLN
INTRAMUSCULAR | Status: AC
  Filled 2023-06-22: qty 2

## 2023-06-22 MED ORDER — LIDOCAINE 2% (20 MG/ML) 5 ML SYRINGE
INTRAMUSCULAR | Status: DC | PRN
  Administered 2023-06-22: 60 mg via INTRAVENOUS

## 2023-06-22 MED ORDER — BUPIVACAINE HCL (PF) 0.25 % IJ SOLN
INTRAMUSCULAR | Status: DC | PRN
  Administered 2023-06-22: 30 mL

## 2023-06-22 MED ORDER — FENTANYL CITRATE (PF) 100 MCG/2ML IJ SOLN: 25.0000 ug | INTRAMUSCULAR | Status: DC | PRN

## 2023-06-22 MED ORDER — MORPHINE SULFATE (PF) 2 MG/ML IV SOLN: 0.5000 mg | INTRAVENOUS | Status: DC | PRN

## 2023-06-22 MED ORDER — PHENYLEPHRINE HCL-NACL 20-0.9 MG/250ML-% IV SOLN
INTRAVENOUS | Status: AC
  Filled 2023-06-22: qty 250

## 2023-06-22 MED ORDER — DOCUSATE SODIUM 100 MG PO CAPS
100.0000 mg | ORAL_CAPSULE | Freq: Two times a day (BID) | ORAL | Status: DC
  Administered 2023-06-24 – 2023-06-25 (×2): 100 mg via ORAL
  Filled 2023-06-22 (×7): qty 1

## 2023-06-22 MED ORDER — TRANEXAMIC ACID-NACL 1000-0.7 MG/100ML-% IV SOLN
INTRAVENOUS | Status: AC
  Filled 2023-06-22: qty 100

## 2023-06-22 MED ORDER — ROCURONIUM BROMIDE 10 MG/ML (PF) SYRINGE
PREFILLED_SYRINGE | INTRAVENOUS | Status: AC
  Filled 2023-06-22: qty 10

## 2023-06-22 MED ORDER — DEXAMETHASONE SODIUM PHOSPHATE 10 MG/ML IJ SOLN
INTRAMUSCULAR | Status: DC | PRN
  Administered 2023-06-22: 6 mg via INTRAVENOUS

## 2023-06-22 SURGICAL SUPPLY — 71 items
ADH SKN CLS APL DERMABOND .7 (GAUZE/BANDAGES/DRESSINGS) ×1
BAG COUNTER SPONGE SURGICOUNT (BAG) ×1 IMPLANT
BAG SPNG CNTER NS LX DISP (BAG) ×1
BIT DRILL 5/64X5 DISP (BIT) ×1 IMPLANT
BLADE SAW SGTL 73X25 THK (BLADE) ×1 IMPLANT
BRUSH FEMORAL CANAL (MISCELLANEOUS) IMPLANT
CEMENT BONE DEPUY (Cement) IMPLANT
CEMENT RESTRICTOR DEPUY SZ 3 (Cement) IMPLANT
CLSR STERI-STRIP ANTIMIC 1/2X4 (GAUZE/BANDAGES/DRESSINGS) ×2 IMPLANT
COVER SURGICAL LIGHT HANDLE (MISCELLANEOUS) ×1 IMPLANT
DERMABOND ADVANCED .7 DNX12 (GAUZE/BANDAGES/DRESSINGS) IMPLANT
DRAPE INCISE IOBAN 66X45 STRL (DRAPES) IMPLANT
DRAPE ORTHO SPLIT 77X108 STRL (DRAPES) ×2
DRAPE SURG ORHT 6 SPLT 77X108 (DRAPES) ×2 IMPLANT
DRAPE U-SHAPE 47X51 STRL (DRAPES) ×1 IMPLANT
DRSG MEPILEX POST OP 4X12 (GAUZE/BANDAGES/DRESSINGS) IMPLANT
DRSG MEPILEX POST OP 4X8 (GAUZE/BANDAGES/DRESSINGS) IMPLANT
DURAPREP 26ML APPLICATOR (WOUND CARE) ×1 IMPLANT
ELECT CAUTERY BLADE 6.4 (BLADE) ×1 IMPLANT
ELECT REM PT RETURN 9FT ADLT (ELECTROSURGICAL) ×1
ELECTRODE REM PT RTRN 9FT ADLT (ELECTROSURGICAL) ×1 IMPLANT
GLOVE BIO SURGEON STRL SZ7 (GLOVE) ×2 IMPLANT
GLOVE BIOGEL PI IND STRL 7.0 (GLOVE) ×1 IMPLANT
GLOVE ORTHO TXT STRL SZ7.5 (GLOVE) ×1 IMPLANT
GOWN STRL REUS W/ TWL LRG LVL3 (GOWN DISPOSABLE) ×1 IMPLANT
GOWN STRL REUS W/ TWL XL LVL3 (GOWN DISPOSABLE) ×1 IMPLANT
GOWN STRL REUS W/TWL LRG LVL3 (GOWN DISPOSABLE) ×1
GOWN STRL REUS W/TWL XL LVL3 (GOWN DISPOSABLE) ×1
HANDPIECE INTERPULSE COAX TIP (DISPOSABLE) ×1
HEAD FEM UNIPOLAR 43 OD STRL (Hips) IMPLANT
HOOD PEEL AWAY T7 (MISCELLANEOUS) ×1 IMPLANT
HOOD W/PEELAWAY (MISCELLANEOUS) ×1 IMPLANT
KIT BASIN OR (CUSTOM PROCEDURE TRAY) ×1 IMPLANT
KIT TURNOVER KIT B (KITS) ×1 IMPLANT
MANIFOLD NEPTUNE II (INSTRUMENTS) ×1 IMPLANT
NDL 18GX1X1/2 (RX/OR ONLY) (NEEDLE) ×1 IMPLANT
NDL HYPO 22X1.5 SAFETY MO (MISCELLANEOUS) IMPLANT
NDL MAYO TROCAR (NEEDLE) IMPLANT
NEEDLE 18GX1X1/2 (RX/OR ONLY) (NEEDLE) IMPLANT
NEEDLE HYPO 22X1.5 SAFETY MO (MISCELLANEOUS) ×1 IMPLANT
NEEDLE MAYO TROCAR (NEEDLE) ×1 IMPLANT
NS IRRIG 1000ML POUR BTL (IV SOLUTION) ×1 IMPLANT
PACK TOTAL JOINT (CUSTOM PROCEDURE TRAY) ×1 IMPLANT
PAD ARMBOARD 7.5X6 YLW CONV (MISCELLANEOUS) ×2 IMPLANT
PRESSURIZER FEMORAL UNIV (MISCELLANEOUS) IMPLANT
RETRIEVER SUT HEWSON (MISCELLANEOUS) ×1 IMPLANT
SET HNDPC FAN SPRY TIP SCT (DISPOSABLE) IMPLANT
SPACER DEPUY (Hips) IMPLANT
STEM DIST FEM CENTRALIZR 11 (Hips) IMPLANT
STEM SUMMIT CEMENT BASIC SZ4 (Hips) IMPLANT
SUCTION TUBE FRAZIER 10FR DISP (SUCTIONS) ×1 IMPLANT
SUCTION TUBE FRAZIER 12FR DISP (SUCTIONS) IMPLANT
SUT ETHIBOND 2 V 37 (SUTURE) IMPLANT
SUT FIBERWIRE #2 38 REV NDL BL (SUTURE)
SUT VIC AB 0 CT1 27 (SUTURE)
SUT VIC AB 0 CT1 27XBRD ANBCTR (SUTURE) ×1 IMPLANT
SUT VIC AB 1 CTX 36 (SUTURE) ×2
SUT VIC AB 1 CTX36XBRD ANBCTR (SUTURE) IMPLANT
SUT VIC AB 2-0 CT1 27 (SUTURE) ×2
SUT VIC AB 2-0 CT1 TAPERPNT 27 (SUTURE) ×1 IMPLANT
SUT VIC AB 3-0 SH 27 (SUTURE) ×1
SUT VIC AB 3-0 SH 27X BRD (SUTURE) IMPLANT
SUT VIC AB 3-0 SH 8-18 (SUTURE) ×1 IMPLANT
SUTURE FIBERWR#2 38 REV NDL BL (SUTURE) ×3 IMPLANT
SYR CONTROL 10ML LL (SYRINGE) ×1 IMPLANT
TOWEL GREEN STERILE (TOWEL DISPOSABLE) ×1 IMPLANT
TOWEL GREEN STERILE FF (TOWEL DISPOSABLE) ×1 IMPLANT
TOWER CARTRIDGE SMART MIX (DISPOSABLE) IMPLANT
TRAY CATH INTERMITTENT SS 16FR (CATHETERS) IMPLANT
TRAY FOLEY W/BAG SLVR 14FR (SET/KITS/TRAYS/PACK) IMPLANT
WATER STERILE IRR 1000ML POUR (IV SOLUTION) IMPLANT

## 2023-06-22 NOTE — Progress Notes (Signed)
The patient has been re-examined, and the chart reviewed, and there have been no interval changes to the documented history and physical.    The risks benefits and alternatives were discussed with the patient including but not limited to the risks of nonoperative treatment, versus surgical intervention including infection, bleeding, nerve injury, periprosthetic fracture, the need for revision surgery, dislocation, leg length discrepancy, blood clots, cardiopulmonary complications, morbidity, mortality, among others, and they were willing to proceed.    Johnny Bridge, MD

## 2023-06-22 NOTE — Anesthesia Preprocedure Evaluation (Signed)
Anesthesia Evaluation  Patient identified by MRN, date of birth, ID band Patient awake    Reviewed: Allergy & Precautions, H&P , NPO status , Patient's Chart, lab work & pertinent test results  History of Anesthesia Complications (+) PONV and history of anesthetic complications  Airway Mallampati: II   Neck ROM: full    Dental   Pulmonary neg pulmonary ROS   breath sounds clear to auscultation       Cardiovascular negative cardio ROS  Rhythm:regular Rate:Normal     Neuro/Psych  PSYCHIATRIC DISORDERS Anxiety        GI/Hepatic ,GERD  ,,  Endo/Other    Renal/GU      Musculoskeletal   Abdominal   Peds  Hematology   Anesthesia Other Findings   Reproductive/Obstetrics                             Anesthesia Physical Anesthesia Plan  ASA: 3  Anesthesia Plan: General   Post-op Pain Management:    Induction: Intravenous  PONV Risk Score and Plan: 4 or greater and Ondansetron, Dexamethasone and Treatment may vary due to age or medical condition  Airway Management Planned: Oral ETT  Additional Equipment:   Intra-op Plan:   Post-operative Plan: Extubation in OR  Informed Consent: I have reviewed the patients History and Physical, chart, labs and discussed the procedure including the risks, benefits and alternatives for the proposed anesthesia with the patient or authorized representative who has indicated his/her understanding and acceptance.     Dental advisory given  Plan Discussed with: CRNA, Anesthesiologist and Surgeon  Anesthesia Plan Comments:        Anesthesia Quick Evaluation

## 2023-06-22 NOTE — Anesthesia Procedure Notes (Signed)
Procedure Name: Intubation Date/Time: 06/22/2023 7:48 AM  Performed by: Debbe Odea, CRNAPre-anesthesia Checklist: Patient identified, Emergency Drugs available, Suction available and Patient being monitored Patient Re-evaluated:Patient Re-evaluated prior to induction Oxygen Delivery Method: Circle System Utilized Preoxygenation: Pre-oxygenation with 100% oxygen Induction Type: IV induction Ventilation: Mask ventilation without difficulty Laryngoscope Size: Miller and 2 Grade View: Grade I Tube type: Oral Tube size: 7.0 mm Number of attempts: 1 Airway Equipment and Method: Stylet Placement Confirmation: ETT inserted through vocal cords under direct vision, positive ETCO2 and breath sounds checked- equal and bilateral Secured at: 22 cm Tube secured with: Tape Dental Injury: Teeth and Oropharynx as per pre-operative assessment

## 2023-06-22 NOTE — Op Note (Signed)
06/21/2023 - 06/22/2023  9:47 AM  PATIENT:  Frances Ortiz   MRN: [id number]  PRE-OPERATIVE DIAGNOSIS: Right femoral neck fracture  POST-OPERATIVE DIAGNOSIS: Right femoral neck fracture  PROCEDURE:  Procedure(s): Right hip cemented hemiarthroplasty  PREOPERATIVE INDICATIONS:  Frances Ortiz is an 89 y.o. female who was admitted 06/21/2023 with a diagnosis of Hip fracture [facility]) and elected for surgical management.  The risks benefits and alternatives were discussed with the patient including but not limited to the risks of nonoperative treatment, versus surgical intervention including infection, bleeding, nerve injury, periprosthetic fracture, the need for revision surgery, dislocation, leg length discrepancy, blood clots, cardiopulmonary complications, morbidity, mortality, among others, and they were willing to proceed.    OPERATIVE REPORT     SURGEON:  Teryl Lucy, MD    ASSISTANT:  Janine Ores, PA-C,   (Present throughout the entire procedure,  necessary for completion of procedure in a timely manner, assisting with retraction, instrumentation, and closure)     Second Assistant: Sander Radon, PA-C  ANESTHESIA: General  ESTIMATED BLOOD LOSS: 250 mL    COMPLICATIONS:  None.   UNIQUE ASPECTS OF THE CASE: Bone quality was overall relatively poor.  The acetabulum was in good condition however, and she did not need a total hip replacement.  Preoperatively she had discussed about having "hip arthritis", which she said came on last year after she broke her back.  Her preoperative x-rays did not demonstrate any evidence for hip arthritis.  She did however have a displaced femoral neck fracture.  During the broaching portion of the procedure, she countersunk a size 4, but the size 5 was fairly difficult to get down.  I worked out at a fair amount, and then ultimately had 1-1/2 teeth showing, the angle of the cut was slightly off, but I was concerned about using a calcar planer for fear to  fracture the calcar.  During the initial trialing attempts, the posterior aspect of the calcar above the lesser trochanter cracked slightly, so I converted to a cemented hemiarthroplasty.     COMPONENTS:    Implant Name Type Inv. Item Serial No. Manufacturer Lot No. LRB No. Used Action  CEMENT BONE DEPUY - [id number] Cement CEMENT BONE DEPUY  [employer]PU[employer] [id number] Right 2 Implanted  CEMENT RESTRICTOR DEPUY SZ 3 - [id number] Cement CEMENT RESTRICTOR DEPUY SZ 3  DEPUY ORTHOPA[employer] [id number] Right 1 Implanted  STEM SUMMIT CEMENT BASIC SZ4 - [id number] Hips STEM SUMMIT CEMENT BASIC SZ4  DEPUY ORTHOPAEDICS [id number] Right 1 Implanted  STEM DIST FEM CENTRALIZR 11 - [id number] Hips STEM DIST FEM CENTRALIZR 11  DEPUY ORTHOPAEDICS [id number] Right 1 Implanted  SPACER DEPUY - [id number] Hips SPACER DEPUY  DEPUY ORTHOPAEDICS [id number] Right 1 Implanted  HEAD FEM UNIPOLAR 43 OD STRL - [id number] Hips HEAD FEM UNIPOLAR 43 OD STRL  DEPUY ORTHOPA[employer] [id number] Right 1 Implanted      PROCEDURE IN DETAIL: The patient was met in the holding area and identified.  The appropriate hip  was marked at the operative site. The patient was then transported to the OR and  placed under anesthesia.  At that point, the patient was  placed in the lateral decubitus position with the operative side up and  secured to the operating room table and all bony prominences padded.     The operative lower extremity was prepped from the iliac crest to the toes.  Sterile draping was performed.  Time out was performed prior to incision.      A routine  posterolateral approach was utilized via sharp dissection  carried down to the subcutaneous tissue.  Gross bleeders were Bovie  coagulated.  The iliotibial band was identified and incised  along the length of the skin incision.  Self-retaining retractors were  inserted.  With the hip internally rotated, the short external rotators  were identified. The piriformis was tagged with Ethibond, and the  hip capsule released in a T-type fashion.  The femoral neck was exposed, and I resected the femoral neck using the appropriate jig. This was performed at approximately a thumb's breadth above the lesser trochanter.    I then exposed the deep acetabulum, cleared out any tissue including the ligamentum teres.    I then prepared the proximal femur using the cookie-cutter, the lateralizing reamer, and then sequentially broached.  I struggled to get the 5 down, and ultimately had it adequately position, but during the attempted reduction, the hip was tight, I think it was too long, and it cracked the proximal calcar.  So I then converted to a cemented prosthesis.  A canal restrictor was placed, and I used a pulse lavage extended tip brush to clean the canal, and then placed the cement, pressurized it, and then placed the real prosthesis.  I allowed the cement to fully cure.  I then trialed with a -3, which felt appropriate from a soft tissue standpoint as well as a stability standpoint and then placed the real hip ball, the hip was then reduced and taken through functional range of motion and found to have excellent stability. Leg lengths were restored.  I then used a 2 mm drill bits to pass the Ethibond suture from the capsule and piriformis through the greater trochanter, and secured this. Excellent posterior capsular repair was achieved. I also closed the T in the capsule.  I then irrigated the hip copiously again with pulse lavage, and repaired the fascia with Vicryl, followed by Vicryl for the subcutaneous tissue, Monocryl for the skin, Steri-Strips and sterile gauze. The wounds were injected. The patient was then awakened and returned to PACU in stable and satisfactory condition. There were no complications.  Teryl Lucy, MD Orthopedic Surgeon [phone]   06/22/2023 9:47 AM

## 2023-06-22 NOTE — Discharge Instructions (Signed)
INSTRUCTIONS AFTER JOINT REPLACEMENT   Remove items at home which could result in a fall. This includes throw rugs or furniture in walking pathways ICE to the affected joint every three hours while awake for 30 minutes at a time, for at least the first 3-5 days, and then as needed for pain and swelling.  Continue to use ice for pain and swelling. You may notice swelling that will progress down to the foot and ankle.  This is normal after surgery.  Elevate your leg when you are not up walking on it.   Continue to use the breathing machine you got in the hospital (incentive spirometer) which will help keep your temperature down.  It is common for your temperature to cycle up and down following surgery, especially at night when you are not up moving around and exerting yourself.  The breathing machine keeps your lungs expanded and your temperature down.   DIET:  As you were doing prior to hospitalization, we recommend a well-balanced diet.  DRESSING / WOUND CARE / SHOWERING  You may shower 3 days after surgery, but keep the wounds dry during showering.  You may use an occlusive plastic wrap (Press'n Seal for example), NO SOAKING/SUBMERGING IN THE BATHTUB.  If the bandage gets wet, change with a clean dry gauze.  If the incision gets wet, pat the wound dry with a clean towel.  ACTIVITY  Increase activity slowly as tolerated, but follow the weight bearing instructions below.   No driving for 6 weeks or until further direction given by your physician.  You cannot drive while taking narcotics.  No lifting or carrying greater than 10 lbs. until further directed by your surgeon. Avoid periods of inactivity such as sitting longer than an hour when not asleep. This helps prevent blood clots.  You may return to work once you are authorized by your doctor.     WEIGHT BEARING   Weight bearing as tolerated with assist device (walker, cane, etc) as directed, use it as long as suggested by your surgeon or  therapist, typically at least 4-6 weeks.   EXERCISES  Results after joint replacement surgery are often greatly improved when you follow the exercise, range of motion and muscle strengthening exercises prescribed by your doctor. Safety measures are also important to protect the joint from further injury. Any time any of these exercises cause you to have increased pain or swelling, decrease what you are doing until you are comfortable again and then slowly increase them. If you have problems or questions, call your caregiver or physical therapist for advice.   Rehabilitation is important following a joint replacement. After just a few days of immobilization, the muscles of the leg can become weakened and shrink (atrophy).  These exercises are designed to build up the tone and strength of the thigh and leg muscles and to improve motion. Often times heat used for twenty to thirty minutes before working out will loosen up your tissues and help with improving the range of motion but do not use heat for the first two weeks following surgery (sometimes heat can increase post-operative swelling).   These exercises can be done on a training (exercise) mat, on the floor, on a table or on a bed. Use whatever works the best and is most comfortable for you.    Use music or television while you are exercising so that the exercises are a pleasant break in your day. This will make your life better with the exercises acting  as a break in your routine that you can look forward to.   Perform all exercises about fifteen times, three times per day or as directed.  You should exercise both the operative leg and the other leg as well.  Exercises include:   Quad Sets - Tighten up the muscle on the front of the thigh (Quad) and hold for 5-10 seconds.   Straight Leg Raises - With your knee straight (if you were given a brace, keep it on), lift the leg to 60 degrees, hold for 3 seconds, and slowly lower the leg.  Perform this  exercise against resistance later as your leg gets stronger.  Leg Slides: Lying on your back, slowly slide your foot toward your buttocks, bending your knee up off the floor (only go as far as is comfortable). Then slowly slide your foot back down until your leg is flat on the floor again.  Angel Wings: Lying on your back spread your legs to the side as far apart as you can without causing discomfort.  Hamstring Strength:  Lying on your back, push your heel against the floor with your leg straight by tightening up the muscles of your buttocks.  Repeat, but this time bend your knee to a comfortable angle, and push your heel against the floor.  You may put a pillow under the heel to make it more comfortable if necessary.   A rehabilitation program following joint replacement surgery can speed recovery and prevent re-injury in the future due to weakened muscles. Contact your doctor or a physical therapist for more information on knee rehabilitation.    CONSTIPATION  Constipation is defined medically as fewer than three stools per week and severe constipation as less than one stool per week.  Even if you have a regular bowel pattern at home, your normal regimen is likely to be disrupted due to multiple reasons following surgery.  Combination of anesthesia, postoperative narcotics, change in appetite and fluid intake all can affect your bowels.   YOU MUST use at least one of the following options; they are listed in order of increasing strength to get the job done.  They are all available over the counter, and you may need to use some, POSSIBLY even all of these options:    Drink plenty of fluids (prune juice may be helpful) and high fiber foods Colace 100 mg by mouth twice a day  Senokot for constipation as directed and as needed Dulcolax (bisacodyl), take with full glass of water  Miralax (polyethylene glycol) once or twice a day as needed.  If you have tried all these things and are unable to have a  bowel movement in the first 3-4 days after surgery call either your surgeon or your primary doctor.    If you experience loose stools or diarrhea, hold the medications until you stool forms back up.  If your symptoms do not get better within 1 week or if they get worse, check with your doctor.  If you experience "the worst abdominal pain ever" or develop nausea or vomiting, please contact the office immediately for further recommendations for treatment.   ITCHING:  If you experience itching with your medications, try taking only a single pain pill, or even half a pain pill at a time.  You can also use Benadryl over the counter for itching or also to help with sleep.   TED HOSE STOCKINGS:  Use stockings on both legs until for at least 2 weeks or as directed by  physician office. They may be removed at night for sleeping.  MEDICATIONS:  See your medication summary on the "After Visit Summary" that nursing will review with you.  You may have some home medications which will be placed on hold until you complete the course of blood thinner medication.  It is important for you to complete the blood thinner medication as prescribed.  PRECAUTIONS:  If you experience chest pain or shortness of breath - call 911 immediately for transfer to the hospital emergency department.   If you develop a fever greater that 101 F, purulent drainage from wound, increased redness or drainage from wound, foul odor from the wound/dressing, or calf pain - CONTACT YOUR SURGEON.                                                   FOLLOW-UP APPOINTMENTS:  If you do not already have a post-op appointment, please call the office for an appointment to be seen by your surgeon.  Guidelines for how soon to be seen are listed in your "After Visit Summary", but are typically between 1-4 weeks after surgery.  OTHER INSTRUCTIONS:    POST-OPERATIVE OPIOID TAPER INSTRUCTIONS: It is important to wean off of your opioid medication as soon as  possible. If you do not need pain medication after your surgery it is ok to stop day one. Opioids include: Codeine, Hydrocodone(Norco, Vicodin), Oxycodone(Percocet, oxycontin) and hydromorphone amongst others.  Long term and even short term use of opiods can cause: Increased pain response Dependence Constipation Depression Respiratory depression And more.  Withdrawal symptoms can include Flu like symptoms Nausea, vomiting And more Techniques to manage these symptoms Hydrate well Eat regular healthy meals Stay active Use relaxation techniques(deep breathing, meditating, yoga) Do Not substitute Alcohol to help with tapering If you have been on opioids for less than two weeks and do not have pain than it is ok to stop all together.  Plan to wean off of opioids This plan should start within one week post op of your joint replacement. Maintain the same interval or time between taking each dose and first decrease the dose.  Cut the total daily intake of opioids by one tablet each day Next start to increase the time between doses. The last dose that should be eliminated is the evening dose.   MAKE SURE YOU:  Understand these instructions.  Get help right away if you are not doing well or get worse.    Thank you for letting us be a part of your medical care team.  It is a privilege we respect greatly.  We hope these instructions will help you stay on track for a fast and full recovery!

## 2023-06-22 NOTE — Transfer of Care (Signed)
Immediate Anesthesia Transfer of Care Note  Patient: Frances Ortiz  Procedure(s) Performed: POSTERIOR APPROACH HEMI HIP ARTHROPLASTY (Right)  Patient Location: PACU  Anesthesia Type:General  Level of Consciousness: sedated  Airway & Oxygen Therapy: Patient Spontanous Breathing and Patient connected to face mask oxygen  Post-op Assessment: Report given to RN and Post -op Vital signs reviewed and stable  Post vital signs: stable  Last Vitals:  Vitals Value Taken Time  BP 126/68 06/22/23 1024  Temp    Pulse 82 06/22/23 1029  Resp 12 06/22/23 1029  SpO2 91 % 06/22/23 1029  Vitals shown include unfiled device data.  Last Pain:  Vitals:   06/22/23 0640  TempSrc: Oral  PainSc:          Complications: There were no known notable events for this encounter.

## 2023-06-22 NOTE — Progress Notes (Signed)
PROGRESS NOTE                                                                                                                                                                                                             Patient Demographics:    Frances Ortiz, is a 89 y.o. female, DOB - 07/06/1931, ZHY:[id number]  Outpatient Primary MD for the patient is Irven Coe, MD    LOS - 1  Admit date - 06/21/2023    Chief Complaint  Patient presents with   Fall       Brief Narrative (HPI from H&P)   89 y.o. female with medical history significant of hyperlipidemia, GERD, anxiety/depression, cecal cancer status post right colectomy presented to the ED complaining of right hip pain after a mechanical fall.  Workup in the ER showed acute right femoral neck fracture and she was admitted to the hospital.   Subjective:    [name] today has, No headache, No chest pain, No abdominal pain - No Nausea, No new weakness tingling or numbness, shortness of breath, mild right hip postop pain.   Assessment  & Plan :   Acute right femoral neck fracture - Secondary to a mechanical fall. Seen by Dr Lorretta Harp pots ORIF 06/22/23, bearing as tolerated, Lovenox for DVT prophylaxis.  Advance activity PT OT.   Sinus tachycardia Likely related to pain and anxiety.  Continue IV fluid hydration.  Continue management of pain and anxiety.   Elevated blood pressure readings Placed on oral beta-blocker along with as needed IV as needed.   ?Chronic versus fungal sinusitis Did on CT scan no symptoms outpatient follow-up with PCP and ENT as needed.   Anxiety/depression Continue Zoloft.      Condition -  fair  Family Communication  : Daughter bedside on 06/22/2023  Code Status : Full code  Consults  : Orthopedics  PUD Prophylaxis :    Procedures  :     Right hip cemented hemiarthroplasty by Dr. Marene Lenz on 06/22/2023      Disposition Plan   :    Status is: Inpatient   DVT Prophylaxis  :    SCDs Start: 06/21/23 2952    Lab Results  Component Value Date   PLT 245 06/21/2023    Diet :  Diet Order  Diet NPO time specified Except for: Sips with Meds  Diet effective midnight                    Inpatient Medications  Scheduled Meds:  chlorhexidine  60 mL Topical Once   [MAR Hold] feeding supplement  237 mL Oral BID BM   [MAR Hold] metoprolol tartrate  50 mg Oral BID   [MAR Hold] multivitamin with minerals  1 tablet Oral Daily   [MAR Hold] sertraline  50 mg Oral Daily   Continuous Infusions:  lactated ringers 10 mL/hr at 06/22/23 1002   [MAR Hold] methocarbamol (ROBAXIN) IV     tranexamic acid     tranexamic acid     PRN Meds:.[MAR Hold] fentaNYL (SUBLIMAZE) injection, fentaNYL (SUBLIMAZE) injection, [MAR Hold] hydrALAZINE, [MAR Hold] methocarbamol **OR** [MAR Hold] methocarbamol (ROBAXIN) IV, [MAR Hold] naLOXone (NARCAN)  injection, [MAR Hold] ondansetron (ZOFRAN) IV, tranexamic acid  Antibiotics  :    Anti-infectives (From admission, onward)    Start     Dose/Rate Route Frequency Ordered Stop   06/22/23 0715  ceFAZolin (ANCEF) IVPB 2g/100 mL premix        2 g 200 mL/hr over 30 Minutes Intravenous On call to O.R. 06/22/23 0706 06/22/23 0753   06/22/23 0640  ceFAZolin (ANCEF) 2-4 GM/100ML-% IVPB       Note to Pharmacy: Kathrene Bongo D: cabinet override      06/22/23 0640 06/22/23 0808         Objective:   Vitals:   06/22/23 1024 06/22/23 1030 06/22/23 1045 06/22/23 1100  BP: 126/68 136/71  (!) 155/77  Pulse: 82 80 81 79  Resp: 16 12 15 17   Temp: 97.7 F (36.5 C)   98.1 F (36.7 C)  TempSrc:      SpO2: 93% 92% 96% 96%  Weight:      Height:        Wt Readings from Last 3 Encounters:  06/22/23 61 kg  02/25/23 61 kg  04/18/22 60.4 kg     Intake/Output Summary (Last 24 hours) at 06/22/2023 1113 Last data filed at 06/22/2023 1002 Gross per 24 hour  Intake 2635.16 ml   Output 850 ml  Net 1785.16 ml     Physical Exam  Awake Alert, No new F.N deficits, Normal affect [place].AT,PERRAL Supple Neck, No JVD,   Symmetrical Chest wall movement, Good air movement bilaterally, CTAB RRR,No Gallops,Rubs or new Murmurs,  +ve B.Sounds, Abd Soft, No tenderness,   Right hip postop site stable     Data Review:    Recent Labs  Lab 06/21/23 0257 06/21/23 0634  WBC 19.6* 23.0*  HGB 13.6 13.2  HCT 42.0 39.0  PLT 272 245  MCV 96.3 94.4  MCH 31.2 32.0  MCHC 32.4 33.8  RDW 13.1 13.1    Recent Labs  Lab 06/21/23 0257  NA 139  K 3.7  CL 104  CO2 24  ANIONGAP 11  GLUCOSE 109*  BUN 22  CREATININE 0.62  CALCIUM 9.0      Recent Labs  Lab 06/21/23 0257  CALCIUM 9.0    Micro Results Recent Results (from the past 240 hour(s))  SARS Coronavirus 2 by RT PCR (hospital order, performed in [facility] hospital lab) *cepheid single result test* Anterior Nasal Swab     Status: None   Collection Time: 06/21/23  3:55 AM   Specimen: Anterior Nasal Swab  Result Value Ref Range Status   SARS Coronavirus 2 by RT PCR NEGATIVE  NEGATIVE Final    Comment: Performed at [facility], [address], [place], [place] [address]  MRSA Next Gen by PCR, Nasal     Status: None   Collection Time: 06/21/23 11:00 PM   Specimen: Nasal Mucosa; Nasal Swab  Result Value Ref Range Status   MRSA by PCR Next Gen NOT DETECTED NOT DETECTED Final    Comment: (NOTE) The GeneXpert MRSA Assay (FDA approved for NASAL specimens only), is one component of a comprehensive MRSA colonization surveillance program. It is not intended to diagnose MRSA infection nor to guide or monitor treatment for MRSA infections. Test performance is not FDA approved in patients less than 41 years old. Performed at [facility], [address] [place], [place] [address]     Radiology Reports CT HIP RIGHT WO CONTRAST  Result Date: 06/21/2023 CLINICAL DATA:  Right femoral neck fracture.   Fell while walking. EXAM: CT OF THE RIGHT HIP WITHOUT CONTRAST TECHNIQUE: Multidetector CT imaging of the right hip was performed according to the standard protocol. Multiplanar CT image reconstructions were also generated. RADIATION DOSE REDUCTION: This exam was performed according to the departmental dose-optimization program which includes automated exposure control, adjustment of the mA and/or kV according to patient size and/or use of iterative reconstruction technique. COMPARISON:  Right hip x-rays from same day. FINDINGS: Bones/Joint/Cartilage Acute impacted right subcapital femoral neck fracture. No dislocation. Mild right hip joint space narrowing with small marginal osteophytes. No significant joint effusion. Ligaments Ligaments are suboptimally evaluated by CT. Muscles and Tendons Grossly intact.  Gluteus minimus and medius muscle atrophy. Soft tissue Soft tissue swelling along the posterolateral hip with small 2.0 x 1.6 x 1.8 cm hematoma. No soft tissue mass. Small fat containing right inguinal hernia. IMPRESSION: 1. Acute impacted right subcapital femoral neck fracture. 2. Soft tissue swelling along the posterolateral hip with small 2.0 cm hematoma. Electronically Signed   By: Obie Dredge M.D.   On: 06/21/2023 07:52   DG Knee Right Port  Result Date: 06/21/2023 CLINICAL DATA:  Right hip fracture after ground level fall. EXAM: PORTABLE RIGHT KNEE - 1-2 VIEW COMPARISON:  None Available. FINDINGS: Osteopenia. There is no joint effusion. There is no sign of acute fracture or dislocation. Soft tissues are unremarkable. IMPRESSION: 1. No acute osseous findings. 2. Osteopenia. Electronically Signed   By: Signa Kell M.D.   On: 06/21/2023 07:52   DG HIP UNILAT WITH PELVIS 2-3 VIEWS RIGHT  Result Date: 06/21/2023 CLINICAL DATA:  [id number] Fall [id number] EXAM: DG HIP (WITH OR WITHOUT PELVIS) 2-3V RIGHT COMPARISON:  CT abdomen pelvis 10/02/2017. FINDINGS: Shortening of the right femoral neck with  associated poorly visualized lucency along the neck/head consistent with an acute fracture. No right hip dislocation peer frontal view of the left hip demonstrates no acute fracture or dislocation. Acute displaced fracture or diastasis of the bones of the pelvis. There is no evidence of severe arthropathy or other focal bone abnormality. Kyphoplasty noted of the lumbar spine. IMPRESSION: Acute right femoral neck/neck fracture poorly visualized. Electronically Signed   By: Tish Frederickson M.D.   On: 06/21/2023 03:16   DG Chest Port 1 View  Result Date: 06/21/2023 CLINICAL DATA:  fall EXAM: PORTABLE CHEST 1 VIEW COMPARISON:  CT abdomen pelvis 10/02/2017 FINDINGS: The heart and mediastinal contours are within normal limits. Aortic calcification. Hyperinflation of the lungs. No focal consolidation. Coarsened interstitial markings with no overt pulmonary edema. No pleural effusion. No pneumothorax. No acute osseous abnormality. IMPRESSION: 1.  No active disease. 2. Aortic Atherosclerosis (ICD10-I70.0) and Emphysema (ICD10-J43.9). Electronically Signed   By: Tish Frederickson M.D.   On: 06/21/2023 03:14   CT HEAD WO CONTRAST ( )  Result Date: 06/21/2023 CLINICAL DATA:  Polytrauma, blunt EXAM: CT HEAD WITHOUT CONTRAST CT CERVICAL SPINE WITHOUT CONTRAST TECHNIQUE: Multidetector CT imaging of the head and cervical spine was performed following the standard protocol without intravenous contrast. Multiplanar CT image reconstructions of the cervical spine were also generated. RADIATION DOSE REDUCTION: This exam was performed according to the departmental dose-optimization program which includes automated exposure control, adjustment of the mA and/or kV according to patient size and/or use of iterative reconstruction technique. COMPARISON:  CT head 12/09/2022 FINDINGS: CT HEAD FINDINGS Brain: Cerebral ventricle sizes are concordant with the degree of cerebral volume loss. Patchy and confluent areas of decreased  attenuation are noted throughout the deep and periventricular white matter of the cerebral hemispheres bilaterally, compatible with chronic microvascular ischemic disease. No evidence of large-territorial acute infarction. No parenchymal hemorrhage. No mass lesion. No extra-axial collection. No mass effect or midline shift. No hydrocephalus. Basilar cisterns are patent. Vascular: No hyperdense vessel. Skull: No acute fracture or focal lesion. Sinuses/Orbits: Right sphenoid sinus mucosal thickening with calcifications. Paranasal sinuses and mastoid air cells are clear. Bilateral lens replacement. Otherwise the orbits are unremarkable. Other: None. CT CERVICAL SPINE FINDINGS Alignment: Grade 1 anterolisthesis of C3 on C4 and C4 on C5. Skull base and vertebrae: Multilevel moderate severe degenerative changes spine most prominent at the C6-C7 level. Associated multilevel severe osseous neural foraminal stenosis. No severe osseous central canal stenosis. No acute fracture. No aggressive appearing focal osseous lesion or focal pathologic process. Soft tissues and spinal canal: No prevertebral fluid or swelling. No visible canal hematoma. Upper chest: Unremarkable. Other: None. IMPRESSION: 1. No acute intracranial abnormality. 2. No acute displaced fracture or traumatic listhesis of the cervical spine. 3. Right sphenoid sinus mucosal thickening with calcifications. Findings suggestive of chronic versus fungal sinusitis. Electronically Signed   By: Tish Frederickson M.D.   On: 06/21/2023 03:12   CT Cervical Spine Wo Contrast  Result Date: 06/21/2023 CLINICAL DATA:  Polytrauma, blunt EXAM: CT HEAD WITHOUT CONTRAST CT CERVICAL SPINE WITHOUT CONTRAST TECHNIQUE: Multidetector CT imaging of the head and cervical spine was performed following the standard protocol without intravenous contrast. Multiplanar CT image reconstructions of the cervical spine were also generated. RADIATION DOSE REDUCTION: This exam was performed  according to the departmental dose-optimization program which includes automated exposure control, adjustment of the mA and/or kV according to patient size and/or use of iterative reconstruction technique. COMPARISON:  CT head 12/09/2022 FINDINGS: CT HEAD FINDINGS Brain: Cerebral ventricle sizes are concordant with the degree of cerebral volume loss. Patchy and confluent areas of decreased attenuation are noted throughout the deep and periventricular white matter of the cerebral hemispheres bilaterally, compatible with chronic microvascular ischemic disease. No evidence of large-territorial acute infarction. No parenchymal hemorrhage. No mass lesion. No extra-axial collection. No mass effect or midline shift. No hydrocephalus. Basilar cisterns are patent. Vascular: No hyperdense vessel. Skull: No acute fracture or focal lesion. Sinuses/Orbits: Right sphenoid sinus mucosal thickening with calcifications. Paranasal sinuses and mastoid air cells are clear. Bilateral lens replacement. Otherwise the orbits are unremarkable. Other: None. CT CERVICAL SPINE FINDINGS Alignment: Grade 1 anterolisthesis of C3 on C4 and C4 on C5. Skull base and vertebrae: Multilevel moderate severe degenerative changes spine most prominent at the C6-C7 level. Associated multilevel severe osseous neural foraminal stenosis. No severe osseous central canal  stenosis. No acute fracture. No aggressive appearing focal osseous lesion or focal pathologic process. Soft tissues and spinal canal: No prevertebral fluid or swelling. No visible canal hematoma. Upper chest: Unremarkable. Other: None. IMPRESSION: 1. No acute intracranial abnormality. 2. No acute displaced fracture or traumatic listhesis of the cervical spine. 3. Right sphenoid sinus mucosal thickening with calcifications. Findings suggestive of chronic versus fungal sinusitis. Electronically Signed   By: Tish Frederickson M.D.   On: 06/21/2023 03:12      Signature  -   Susa Raring M.D on  06/22/2023 at 11:13 AM   -  To page go to [email or web address]

## 2023-06-23 DIAGNOSIS — S72001A Fracture of unspecified part of neck of right femur, initial encounter for closed fracture: Secondary | ICD-10-CM | POA: Diagnosis not present

## 2023-06-23 LAB — BASIC METABOLIC PANEL
Anion gap: 11 (ref 5–15)
BUN: 33 mg/dL — ABNORMAL HIGH (ref 8–23)
CO2: 23 mmol/L (ref 22–32)
Calcium: 8.8 mg/dL — ABNORMAL LOW (ref 8.9–10.3)
Chloride: 104 mmol/L (ref 98–111)
Creatinine, Ser: 0.65 mg/dL (ref 0.44–1.00)
GFR, Estimated: >60 mL/min (ref >60)
Glucose, Bld: 121 mg/dL — ABNORMAL HIGH (ref 70–99)
Potassium: 4.7 mmol/L (ref 3.5–5.1)
Sodium: 138 mmol/L (ref 135–145)

## 2023-06-23 LAB — CBC WITH DIFFERENTIAL/PLATELET
Abs Immature Granulocytes: 0.13 10*3/uL — ABNORMAL HIGH (ref 0.00–0.07)
Basophils Absolute: 0 10*3/uL (ref 0.0–0.1)
Basophils Relative: 0 %
Eosinophils Absolute: 0.2 10*3/uL (ref 0.0–0.5)
Eosinophils Relative: 1 %
HCT: 34.2 % — ABNORMAL LOW (ref 36.0–46.0)
Hemoglobin: 11 g/dL — ABNORMAL LOW (ref 12.0–15.0)
Immature Granulocytes: 1 %
Lymphocytes Relative: 10 %
Lymphs Abs: 1.6 10*3/uL (ref 0.7–4.0)
MCH: 31.4 pg (ref 26.0–34.0)
MCHC: 32.2 g/dL (ref 30.0–36.0)
MCV: 97.7 fL (ref 80.0–100.0)
Monocytes Absolute: 1.2 10*3/uL — ABNORMAL HIGH (ref 0.1–1.0)
Monocytes Relative: 7 %
Neutro Abs: 12.6 10*3/uL — ABNORMAL HIGH (ref 1.7–7.7)
Neutrophils Relative %: 81 %
Platelets: 215 10*3/uL (ref 150–400)
RBC: 3.5 MIL/uL — ABNORMAL LOW (ref 3.87–5.11)
RDW: 13.5 % (ref 11.5–15.5)
WBC: 15.7 10*3/uL — ABNORMAL HIGH (ref 4.0–10.5)
nRBC: 0 % (ref 0.0–0.2)

## 2023-06-23 LAB — BRAIN NATRIURETIC PEPTIDE: B Natriuretic Peptide: 190.5 pg/mL — ABNORMAL HIGH (ref 0.0–100.0)

## 2023-06-23 LAB — MAGNESIUM: Magnesium: 2 mg/dL (ref 1.7–2.4)

## 2023-06-23 MED ORDER — TRAMADOL HCL 50 MG PO TABS: 50.0000 mg | ORAL_TABLET | Freq: Three times a day (TID) | ORAL | 0 refills | Status: DC | PRN

## 2023-06-23 MED ORDER — ENOXAPARIN SODIUM 40 MG/0.4ML IJ SOSY
40.0000 mg | PREFILLED_SYRINGE | INTRAMUSCULAR | 0 refills | Status: DC
Start: 2023-06-23 — End: 2024-02-27

## 2023-06-23 MED ORDER — AMLODIPINE BESYLATE 5 MG PO TABS
5.0000 mg | ORAL_TABLET | Freq: Every day | ORAL | Status: DC
  Administered 2023-06-23 – 2023-06-25 (×3): 5 mg via ORAL
  Filled 2023-06-23 (×3): qty 1

## 2023-06-23 NOTE — Progress Notes (Signed)
    1 Day Post-Op Procedure(s) (LRB): POSTERIOR APPROACH HEMI HIP ARTHROPLASTY (Right)  Subjective:  Patient reports pain as mild to moderate.  Slept okay with some interruptions.  Motivated for PT today.  Hopeful to DC to SNF when ready. Denies chest pain, ShOB, N/V.  No other complaints.  Objective:   VITALS:   Vitals:   06/22/23 1600 06/22/23 2000 06/22/23 2322 06/23/23 0304  BP: 116/64 133/67 (!) 144/82 137/60  Pulse: 89 93 86 61  Resp: 15 20 20 15   Temp: 97.6 F (36.4 C) 97.6 F (36.4 C) 98 F (36.7 C) 98 F (36.7 C)  TempSrc: Oral Oral Oral Oral  SpO2: 97% 96% 96% 98%  Weight:      Height:        AAOX4, sitting comfortably Neurovascular intact Sensation intact distally Intact pulses distally Dorsiflexion/Plantar flexion intact Incision: dressing C/D/I Compartment soft Wiggles toes appropriately   Lab Results  Component Value Date   WBC 15.7 (H) 06/23/2023   HGB 11.0 (L) 06/23/2023   HCT 34.2 (L) 06/23/2023   MCV 97.7 06/23/2023   PLT 215 06/23/2023   BMET    Component Value Date/Time   NA 138 06/23/2023 0226   K 4.7 06/23/2023 0226   CL 104 06/23/2023 0226   CO2 23 06/23/2023 0226   GLUCOSE 121 (H) 06/23/2023 0226   BUN 33 (H) 06/23/2023 0226   CREATININE 0.65 06/23/2023 0226   CALCIUM 8.8 (L) 06/23/2023 0226   GFRNONAA >60 06/23/2023 0226     Xray: stable post-operative imaging  Assessment/Plan: 1 Day Post-Op   Principal Problem:   Hip fracture (HCC) Active Problems:   Sinus tachycardia   Elevated blood pressure reading  Hgb 11.0  06/23/23, plan to start Lovenox  Weightbearing: WBAT RLE Insicional and dressing care: Reinforce dressings as needed VTE prophylaxis: Lovenox inpatient/outpatient Pain control: continue current regimen, Tramadol printed Follow - up plan: w/ Dr. Dion Saucier in office in 2 weeks Dispo: pending PT/OT evals and medicine recommendation   Cecil Cobbs 06/23/2023, 7:39 AM   Contact information:   Weekdays  7am-5pm epic message Dr. Dion Saucier, Janine Ores PA-C, or call office for patient follow up: [phone] After hours and holidays please check [email or web address] for group call information for [facility]

## 2023-06-23 NOTE — [place] FL2 (Signed)
[place] MEDICAID FL2 LEVEL OF CARE FORM     IDENTIFICATION  Patient Name: Frances Ortiz Birthdate: 08-03-1931 Sex: female Admission Date (Current Location): 06/21/2023  [facility] and [place] Number:  Producer, television/film/video and Address:  [facility], [address], [place], [place] [address]      Provider Number: [id number]  Attending Physician Name and Address:  Leroy Sea, MD  Relative Name and Phone Number:  Shaffer,[name]    Current Level of Care: Hospital Recommended Level of Care: Skilled Nursing Facility Prior Approval Number:    Date Approved/Denied:   PASRR Number: [id number]  Discharge Plan: SNF    Current Diagnoses: Patient Active Problem List   Diagnosis Date Noted   Hip fracture (HCC) 06/21/2023   Sinus tachycardia 06/21/2023   Elevated blood pressure reading 06/21/2023   At risk for inadequate pain control 02/25/2023   Lumbar compression fracture (HCC) 02/25/2023   Lumbar spinal stenosis 02/25/2023   S/P right colectomy 02/25/2023   Inadequate pain control 02/25/2023   Cecal cancer (HCC) 10/07/2017   Cholelithiasis 11/13/2012    Orientation RESPIRATION BLADDER Height & Weight     Self, Situation, Time, Place  O2 (Nasal Cannula 2Lit) Incontinent Weight: 134 lb 7.7 oz (61 kg) Height:  4' 11.02" (149.9 cm)  BEHAVIORAL SYMPTOMS/MOOD NEUROLOGICAL BOWEL NUTRITION STATUS      Continent Diet (Please review clinical notes)  AMBULATORY STATUS COMMUNICATION OF NEEDS Skin   Limited Assist Verbally Normal                       Personal Care Assistance Level of Assistance  Bathing, Dressing Bathing Assistance: Limited assistance   Dressing Assistance: Limited assistance     Functional Limitations Info             SPECIAL CARE FACTORS FREQUENCY                       Contractures Contractures Info: Not present    Additional Factors Info  Code Status (DNR) Code Status Info: DNR              Current Medications (06/23/2023):  This is the current hospital active medication list Current Facility-Administered Medications  Medication Dose Route Frequency Provider Last Rate Last Admin   acetaminophen (TYLENOL) tablet 325-650 mg  325-650 mg Oral Q6H PRN Janine Ores K, PA-C   650 mg at 06/23/23 1328   alum & mag hydroxide-simeth (MAALOX/MYLANTA) 200-200-20 MG/5ML suspension 30 mL  30 mL Oral Q4H PRN Janine Ores K, PA-C       amLODipine (NORVASC) tablet 5 mg  5 mg Oral Daily Leroy Sea, MD   5 mg at 06/23/23 0911   bisacodyl (DULCOLAX) suppository 10 mg  10 mg Rectal Daily PRN Janine Ores K, PA-C       docusate sodium (COLACE) capsule 100 mg  100 mg Oral BID Brown, Blaine K, PA-C       enoxaparin (LOVENOX) injection 40 mg  40 mg Subcutaneous Q24H Brown, Blaine K, PA-C   40 mg at 06/23/23 3664   feeding supplement (ENSURE ENLIVE / ENSURE PLUS) liquid 237 mL  237 mL Oral BID BM Janine Ores K, PA-C   237 mL at 06/23/23 1304   ferrous sulfate tablet 325 mg  325 mg Oral TID PC Janine Ores K, PA-C   325 mg at 06/23/23 1151   hydrALAZINE (APRESOLINE) injection 10 mg  10 mg  Intravenous Q6H PRN Janine Ores K, PA-C       magnesium citrate solution 1 Bottle  1 Bottle Oral Once PRN Janine Ores K, PA-C       menthol-cetylpyridinium (CEPACOL) lozenge 3 mg  1 lozenge Oral PRN Janine Ores K, PA-C       Or   phenol (CHLORASEPTIC) mouth spray 1 spray  1 spray Mouth/Throat PRN Janine Ores K, PA-C       methocarbamol (ROBAXIN) tablet 500 mg  500 mg Oral Q8H PRN Janine Ores K, PA-C       Or   methocarbamol (ROBAXIN) 500 mg in dextrose 5 % 50 mL IVPB  500 mg Intravenous Q8H PRN Manson Passey, Blaine K, PA-C       metoprolol tartrate (LOPRESSOR) tablet 50 mg  50 mg Oral BID Manson Passey, Blaine K, PA-C   50 mg at 06/23/23 0825   morphine (PF) 2 MG/ML injection 0.5 mg  0.5 mg Intravenous Q4H PRN Janine Ores K, PA-C       multivitamin with minerals tablet 1 tablet  1 tablet Oral Daily Armida Sans, PA-C   1 tablet at 06/23/23 6213   naloxone [facility]) injection 0.4 mg  0.4 mg Intravenous PRN Janine Ores K, PA-C       ondansetron [facility]) tablet 4 mg  4 mg Oral Q6H PRN Janine Ores K, PA-C       Or   ondansetron [facility]) injection 4 mg  4 mg Intravenous Q6H PRN Manson Passey, Blaine K, PA-C       polyethylene glycol (MIRALAX / GLYCOLAX) packet 17 g  17 g Oral Daily PRN Manson Passey, Blaine K, PA-C       psyllium (HYDROCIL/METAMUCIL) 1 packet  1 packet Oral Daily Leroy Sea, MD   1 packet at 06/23/23 0825   sertraline (ZOLOFT) tablet 50 mg  50 mg Oral Daily Janine Ores K, PA-C   50 mg at 06/23/23 0865   traMADol (ULTRAM) tablet 50 mg  50 mg Oral Q6H PRN Janine Ores K, PA-C   50 mg at 06/23/23 1457     Discharge Medications: Please see discharge summary for a list of discharge medications.  Relevant Imaging Results:  Relevant Lab Results:   Additional Information SS# [id number]  Helene Kelp, LCSW

## 2023-06-23 NOTE — TOC Initial Note (Signed)
Transition of Care [facility]) - Initial/Assessment Note    Patient Details  Name: Frances Ortiz MRN: [id number] Date of Birth: 12/10/1930  Transition of Care [facility]) CM/SW Contact:    Helene Kelp, LCSW Phone Number: 06/23/2023, 3:13 PM  Clinical Narrative:                 CSW followed-up disposition provider recommendations (SNF placement).  CSW completed initial TOC work-up/assessment as noted by the following below.   CSW met met with the patient and daughter at the bedside to review SNF referral process per clinical recommendations and assess pt's SNF preference.   The patient expressed SNF preference (first option) as [facility] and (second option) as [facility].   The CSW met with the patient's natural support (Shaffer,[name] / [phone]) at the bedside, where she expressed the same for SNF preference [facility] and  [facility].   CSW updated the bedside nurse and additional clinical members ( ) to the information above regarding SNF initial efforts for placement.    CSW referral efforts to support the patient's disposition FL2: completed PASRR: completed SNF referrals: completed  TOC Disposition follow-up needs  Please provide the patient or natural support with bed-offer updates.  Please remember the SNF preference selected by the patient and daughter (beth) [facility] (1) and [facility] (2) Please continue with SNF placement efforts. Please update the clinical team to SNF placement efforts:  No other needs identified by this Clinical research associate currently. Patient needs and current disposition to be followed by   Expected Discharge Plan: Skilled Nursing Facility Barriers to Discharge: Continued Medical Work up  Patient Goals and CMS Choice Patient states their goals for this hospitalization and ongoing recovery are:: To retrun to baseline prior to the causation of hospitlization CMS Medicare.gov Compare Post Acute Care list provided to::  Patient Choice offered to / list presented to : Patient, Adult Children [place] ownership interest in [facility].provided to:: Adult Children    Expected Discharge Plan and Services     Post Acute Care Choice: Skilled Nursing Facility Living arrangements for the past 2 months: Single Family Home                   Prior Living Arrangements/Services Living arrangements for the past 2 months: Single Family Home Lives with:: Self Patient language and need for interpreter reviewed:: Yes Do you feel safe going back to the place where you live?: Yes      Need for Family Participation in Patient Care: Yes (Comment) Care giver support system in place?: Yes (comment)   Criminal Activity/Legal Involvement Pertinent to Current Situation/Hospitalization: Yes - Comment as needed  Activities of Daily Living Home Assistive Devices/Equipment: Dan Humphreys (specify type) ADL Screening (condition at time of admission) Patient's cognitive ability adequate to safely complete daily activities?: Yes Is the patient deaf or have difficulty hearing?: No Does the patient have difficulty seeing, even when wearing glasses/contacts?: No Does the patient have difficulty concentrating, remembering, or making decisions?: No Patient able to express need for assistance with ADLs?: Yes Does the patient have difficulty dressing or bathing?: No Independently performs ADLs?: Yes (appropriate for developmental age) Does the patient have difficulty walking or climbing stairs?: Yes Weakness of Legs: Right Weakness of Arms/Hands: None  Permission Sought/Granted Permission sought to share information with : Case Manager, Magazine features editor  Permission granted to share info w Relationship: Daughter Permission granted to share info w Contact Information: Shaffer,[name]   [phone]  Emotional Assessment Appearance:: Appears younger than stated age   Affect (typically observed): Accepting,  Hopeful Orientation: : Oriented to Self, Oriented to Place, Oriented to  Time, Oriented to Situation   Psych Involvement: No (comment)  Admission diagnosis:  Hip fracture (HCC) [S72.009A] Closed fracture of neck of right femur, initial encounter (HCC) [S72.001A] Patient Active Problem List   Diagnosis Date Noted   Hip fracture (HCC) 06/21/2023   Sinus tachycardia 06/21/2023   Elevated blood pressure reading 06/21/2023   At risk for inadequate pain control 02/25/2023   Lumbar compression fracture (HCC) 02/25/2023   Lumbar spinal stenosis 02/25/2023   S/P right colectomy 02/25/2023   Inadequate pain control 02/25/2023   Cecal cancer (HCC) 10/07/2017   Cholelithiasis 11/13/2012   PCP:  Irven Coe, MD Pharmacy:   [facility] [id number] [place], [place] - [address] [place] [address] Phone: [phone] Fax: [phone]  Social Determinants of Health (SDOH) Social History: SDOH Screenings   Food Insecurity: No Food Insecurity (06/21/2023)  Housing: Patient Declined (06/21/2023)  Transportation Needs: No Transportation Needs (06/21/2023)  Utilities: Not At Risk (06/21/2023)  Tobacco Use: Low Risk  (06/22/2023)   SDOH Interventions:     Readmission Risk Interventions     No data to display

## 2023-06-23 NOTE — Evaluation (Signed)
Physical Therapy Evaluation Patient Details Name: Frances Ortiz MRN: [id number] DOB: [birth year] Today's Date: 06/23/2023  History of Present Illness  89 y.o. female presented to the ED 06/21/23 complaining of right hip pain after a mechanical fall. +right femoral neck fracture.  CT head/C-spine negative for acute finding. 8/31 Rt hip hemiarthroplasty (posterior approach).  PMH significant of hyperlipidemia, anxiety/depression, cecal cancer status post right colectomy  Clinical Impression   Pt admitted secondary to problem above with deficits below. PTA patient was living at home with aide in the morning and evening and alone during the afternoon. She was modified independent with RW. Pt currently requires max assist for bed mobility and mod assist to stand with RW. She was unable to tolerate pain to allow her to advance either LE for transfer or gait. Daughter arrived during session and hopeful pt could return home as she is concerned about COVID levels at post-acute rehab. Explained she needs more assist now and PT recommends short-term rehab with intensity of <3 hrs/day. Pt/daughter understand current recommendation. Anticipate patient will benefit from PT to address problems listed below.Will continue to follow acutely to maximize functional mobility independence and safety.           If plan is discharge home, recommend the following: Two people to help with walking and/or transfers;A lot of help with bathing/dressing/bathroom;Assistance with cooking/housework;Assist for transportation;Help with stairs or ramp for entrance   Can travel by private vehicle   No    Equipment Recommendations None recommended by PT  Recommendations for Other Services  OT consult    Functional Status Assessment Patient has had a recent decline in their functional status and demonstrates the ability to make significant improvements in function in a reasonable and predictable amount of time.     Precautions /  Restrictions Precautions Precautions: Posterior Hip;Fall Precaution Booklet Issued: Yes (comment) Precaution Comments: pt able to recall 1/3 after education Restrictions RLE Weight Bearing: Weight bearing as tolerated      Mobility  Bed Mobility Overal bed mobility: Needs Assistance Bed Mobility: Supine to Sit, Sit to Supine, Rolling Rolling: Max assist   Supine to sit: Max assist, HOB elevated, Used rails Sit to supine: Mod assist   General bed mobility comments: incr time and max assist to laterally scoot prior to initiating supine to sit; incr time to come to sit due to pain; assist to raise legs onto bed; rolling at end to replace drawpad    Transfers Overall transfer level: Needs assistance Equipment used: Rolling walker (2 wheels) Transfers: Sit to/from Stand Sit to Stand: Mod assist          Lateral/Scoot Transfers: Max assist General transfer comment: assist for anterior translation and upward boost; pt initially with legs against bedframe and able to progress to fully upright (although kyphotic); unable to advance either foot (wearing her shoes); lateral scoot along EOB towards [facility]    Ambulation/Gait               General Gait Details: unable to advance either leg  Stairs            Wheelchair Mobility     Tilt Bed    Modified Rankin (Stroke Patients Only)       Balance Overall balance assessment: Needs assistance Sitting-balance support: No upper extremity supported, Feet supported Sitting balance-Leahy Scale: Fair   Postural control: Posterior lean Standing balance support: Bilateral upper extremity supported, Reliant on assistive device for balance Standing balance-Leahy Scale: Poor  Pertinent Vitals/Pain Pain Assessment Pain Assessment: 0-10 Pain Score: 8  Pain Location: Rt outer hip Pain Descriptors / Indicators: Aching, Grimacing, Guarding Pain Intervention(s): Limited activity within  patient's tolerance, Monitored during session    Home Living Family/patient expects to be discharged to:: Private residence Living Arrangements: Alone Available Help at Discharge: Family;Personal care attendant;Available PRN/intermittently Type of Home: House Home Access: Stairs to enter Entrance Stairs-Rails: None Entrance Stairs-Number of Steps: 1+1   Home Layout: One level Home Equipment: Shower seat;Cane - single point;Rollator (4 wheels);Rolling Walker (2 wheels);Lift chair Additional Comments: caregiver from 8:30-12:30 and again in the evening (pt not sure of times)    Prior Function Prior Level of Function : Needs assist             Mobility Comments: ambulating modified independent with RW; needs assistance managing RW up/down steps; has been sleeping in lift chair since back injury/kyphoplasty 02/2023 ADLs Comments: assist with IADLs; ?assist with BADLs     Extremity/Trunk Assessment   Upper Extremity Assessment Upper Extremity Assessment: Generalized weakness    Lower Extremity Assessment Lower Extremity Assessment: RLE deficits/detail;LLE deficits/detail RLE Deficits / Details: AAROM hip flexion to 90; strength assessment limited by pain; tendency to IR at rest RLE: Unable to fully assess due to pain RLE Sensation: WNL LLE Deficits / Details: AAROM limited to hip flexion of 90 due to pain in left groin "arthritis" per pt; strength grossly 4/5    Cervical / Trunk Assessment Cervical / Trunk Assessment: Kyphotic  Communication   Communication Communication: Hearing impairment Cueing Techniques: Verbal cues  Cognition Arousal: Alert Behavior During Therapy: Anxious Overall Cognitive Status: Within Functional Limits for tasks assessed                                 General Comments: a&ox4        General Comments General comments (skin integrity, edema, etc.): Daughter arrived mid-session    Exercises General Exercises - Lower  Extremity Ankle Circles/Pumps: AROM, Both, 10 reps Quad Sets: AROM, Right, 10 reps Heel Slides: AAROM, Right, 5 reps   Assessment/Plan    PT Assessment Patient needs continued PT services  PT Problem List Decreased strength;Decreased range of motion;Decreased activity tolerance;Decreased balance;Decreased mobility;Decreased knowledge of use of DME;Decreased knowledge of precautions;Pain       PT Treatment Interventions DME instruction;Gait training;Functional mobility training;Therapeutic activities;Therapeutic exercise;Patient/family education    PT Goals (Current goals can be found in the Care Plan section)  Acute Rehab PT Goals Patient Stated Goal: return home PT Goal Formulation: With patient Time For Goal Achievement: 07/07/23 Potential to Achieve Goals: Good    Frequency Min 1X/week     Co-evaluation               AM-PAC PT "6 Clicks" Mobility  Outcome Measure Help needed turning from your back to your side while in a flat bed without using bedrails?: Total Help needed moving from lying on your back to sitting on the side of a flat bed without using bedrails?: Total Help needed moving to and from a bed to a chair (including a wheelchair)?: Total Help needed standing up from a chair using your arms (e.g., wheelchair or bedside chair)?: Total Help needed to walk in hospital room?: Total Help needed climbing 3-5 steps with a railing? : Total 6 Click Score: 6    End of Session Equipment Utilized During Treatment: Gait belt;Oxygen Activity Tolerance: Patient limited by  pain Patient left: in bed;with call bell/phone within reach;with bed alarm set;with family/visitor present Nurse Communication: Mobility status;Patient requests pain meds;Other (comment) (posterior hip precautions (gave RN handout to post over pt's bed)) PT Visit Diagnosis: Other abnormalities of gait and mobility (R26.89);History of falling (Z91.81);Muscle weakness (generalized) (M62.81);Pain Pain -  Right/Left:  (bil) Pain - part of body: Hip    Time: 1150-1311 PT Time Calculation (min) (ACUTE ONLY): 81 min   Charges:   PT Evaluation $PT Eval Moderate Complexity: 1 Mod PT Treatments $Gait Training: 8-22 mins $Therapeutic Activity: 23-37 mins PT General Charges $$ ACUTE PT VISIT: 1 Visit          Jerolyn Center, PT Acute Rehabilitation Services  Office [phone]   Zena Amos 06/23/2023, 3:30 PM

## 2023-06-23 NOTE — Progress Notes (Signed)
PROGRESS NOTE                                                                                                                                                                                                             Patient Demographics:    Frances Ortiz, is a 89 y.o. female, DOB - 1931-03-23, JXB:[id number]  Outpatient Primary MD for the patient is Irven Coe, MD    LOS - 2  Admit date - 06/21/2023    Chief Complaint  Patient presents with   Fall       Brief Narrative (HPI from H&P)   89 y.o. female with medical history significant of hyperlipidemia, GERD, anxiety/depression, cecal cancer status post right colectomy presented to the ED complaining of right hip pain after a mechanical fall.  Workup in the ER showed acute right femoral neck fracture and she was admitted to the hospital.   Subjective:   Patient in bed, appears comfortable, denies any headache, no fever, no chest pain or pressure, no shortness of breath , no abdominal pain. No new focal weakness.    Assessment  & Plan :   Acute right femoral neck fracture - Secondary to a mechanical fall. Seen by Dr Lorretta Harp pots ORIF 06/22/23, weight bearing as tolerated, Lovenox for DVT prophylaxis.  Advance activity PT OT, may need placement.   Sinus tachycardia - Likely related to pain and anxiety.  Continue IV fluid hydration.  Continue management of pain and anxiety.   Elevated blood pressure readings - Placed on oral beta-blocker , added Norvasc along with as needed IV as needed.   ?Chronic versus fungal sinusitis - Did on CT scan no symptoms outpatient follow-up with PCP and ENT as needed.   Anxiety/depression - Continue Zoloft.      Condition -  fair  Family Communication  : Daughter [name] [phone]  bedside on 06/22/2023, phone 06/23/23  Code Status : Full code  Consults  : Orthopedics  PUD Prophylaxis :    Procedures  :     Right hip cemented  hemiarthroplasty by Dr. Marene Lenz on 06/22/2023      Disposition Plan  :    Status is: Inpatient   DVT Prophylaxis  :    enoxaparin (LOVENOX) injection 40 mg Start: 06/23/23 0800 SCDs Start: 06/22/23 1137 SCDs Start: 06/21/23 9629  Lab Results  Component Value Date   PLT 215 06/23/2023    Diet :  Diet Order             Diet regular Room service appropriate? Yes; Fluid consistency: Thin  Diet effective now                    Inpatient Medications  Scheduled Meds:  acetaminophen  500 mg Oral Q6H   docusate sodium  100 mg Oral BID   enoxaparin (LOVENOX) injection  40 mg Subcutaneous Q24H   feeding supplement  237 mL Oral BID BM   ferrous sulfate  325 mg Oral TID PC   metoprolol tartrate  50 mg Oral BID   multivitamin with minerals  1 tablet Oral Daily   psyllium  1 packet Oral Daily   sertraline  50 mg Oral Daily   Continuous Infusions:  methocarbamol (ROBAXIN) IV     PRN Meds:.acetaminophen, alum & mag hydroxide-simeth, bisacodyl, hydrALAZINE, magnesium citrate, menthol-cetylpyridinium **OR** phenol, methocarbamol **OR** methocarbamol (ROBAXIN) IV, morphine injection, naLOXone (NARCAN)  injection, ondansetron **OR** ondansetron (ZOFRAN) IV, polyethylene glycol, traMADol    Objective:   Vitals:   06/22/23 2000 06/22/23 2322 06/23/23 0304 06/23/23 0840  BP: 133/67 (!) 144/82 137/60 (!) 157/70  Pulse: 93 86 61 68  Resp: 20 20 15 16   Temp: 97.6 F (36.4 C) 98 F (36.7 C) 98 F (36.7 C) 98.2 F (36.8 C)  TempSrc: Oral Oral Oral   SpO2: 96% 96% 98% 97%  Weight:      Height:        Wt Readings from Last 3 Encounters:  06/22/23 61 kg  02/25/23 61 kg  04/18/22 60.4 kg     Intake/Output Summary (Last 24 hours) at 06/23/2023 0846 Last data filed at 06/22/2023 2030 Gross per 24 hour  Intake 1240 ml  Output 250 ml  Net 990 ml     Physical Exam  Awake Alert, No new F.N deficits, Normal affect [place].AT,PERRAL Supple Neck, No JVD,    Symmetrical Chest wall movement, Good air movement bilaterally, CTAB RRR,No Gallops,Rubs or new Murmurs,  +ve B.Sounds, Abd Soft, No tenderness,   Right hip postop site stable     Data Review:    Recent Labs  Lab 06/21/23 0257 06/21/23 0634 06/22/23 1155 06/23/23 0625  WBC 19.6* 23.0* 19.6* 15.7*  HGB 13.6 13.2 12.6 11.0*  HCT 42.0 39.0 38.7 34.2*  PLT 272 245 217 215  MCV 96.3 94.4 99.2 97.7  MCH 31.2 32.0 32.3 31.4  MCHC 32.4 33.8 32.6 32.2  RDW 13.1 13.1 13.2 13.5  LYMPHSABS  --   --  0.6* 1.6  MONOABS  --   --  0.5 1.2*  EOSABS  --   --  0.0 0.2  BASOSABS  --   --  0.0 0.0    Recent Labs  Lab 06/21/23 0257 06/22/23 1155 06/22/23 1209 06/23/23 0226 06/23/23 0625  NA 139 139  --  138  --   K 3.7 4.3  --  4.7  --   CL 104 101  --  104  --   CO2 24 23  --  23  --   ANIONGAP 11 15  --  11  --   GLUCOSE 109* 134*  --  121*  --   BUN 22 22  --  33*  --   CREATININE 0.62 0.61  --  0.65  --   PROCALCITON  --  <0.10  --   --   --  BNP  --   --  222.5*  --  190.5*  MG  --   --   --  2.0  --   CALCIUM 9.0 8.8*  --  8.8*  --       Recent Labs  Lab 06/21/23 0257 06/22/23 1155 06/22/23 1209 06/23/23 0226 06/23/23 0625  PROCALCITON  --  <0.10  --   --   --   BNP  --   --  222.5*  --  190.5*  MG  --   --   --  2.0  --   CALCIUM 9.0 8.8*  --  8.8*  --     Radiology Reports DG HIP UNILAT W OR W/O PELVIS 2-3 VIEWS RIGHT  Result Date: 06/22/2023 CLINICAL DATA:  Status post right hip cemented hemiarthroplasty for right hip fracture. EXAM: DG HIP (WITH OR WITHOUT PELVIS) 2-3V RIGHT COMPARISON:  Right hip radiographs 06/21/2023, CT right hip 06/21/2023 FINDINGS: Interval right hip hemiarthroplasty. No perihardware lucency is seen to be to indicate hardware failure or loosening. Expected postoperative right hip subcutaneous air. Mild bilateral sacroiliac subchondral sclerosis. Mild left superomedial femoroacetabular joint space narrowing. IMPRESSION: Interval right  hip hemiarthroplasty without evidence of hardware failure or loosening. Electronically Signed   By: Neita Garnet M.D.   On: 06/22/2023 11:43   CT HIP RIGHT WO CONTRAST  Result Date: 06/21/2023 CLINICAL DATA:  Right femoral neck fracture.  Fell while walking. EXAM: CT OF THE RIGHT HIP WITHOUT CONTRAST TECHNIQUE: Multidetector CT imaging of the right hip was performed according to the standard protocol. Multiplanar CT image reconstructions were also generated. RADIATION DOSE REDUCTION: This exam was performed according to the departmental dose-optimization program which includes automated exposure control, adjustment of the mA and/or kV according to patient size and/or use of iterative reconstruction technique. COMPARISON:  Right hip x-rays from same day. FINDINGS: Bones/Joint/Cartilage Acute impacted right subcapital femoral neck fracture. No dislocation. Mild right hip joint space narrowing with small marginal osteophytes. No significant joint effusion. Ligaments Ligaments are suboptimally evaluated by CT. Muscles and Tendons Grossly intact.  Gluteus minimus and medius muscle atrophy. Soft tissue Soft tissue swelling along the posterolateral hip with small 2.0 x 1.6 x 1.8 cm hematoma. No soft tissue mass. Small fat containing right inguinal hernia. IMPRESSION: 1. Acute impacted right subcapital femoral neck fracture. 2. Soft tissue swelling along the posterolateral hip with small 2.0 cm hematoma. Electronically Signed   By: Obie Dredge M.D.   On: 06/21/2023 07:52   DG Knee Right Port  Result Date: 06/21/2023 CLINICAL DATA:  Right hip fracture after ground level fall. EXAM: PORTABLE RIGHT KNEE - 1-2 VIEW COMPARISON:  None Available. FINDINGS: Osteopenia. There is no joint effusion. There is no sign of acute fracture or dislocation. Soft tissues are unremarkable. IMPRESSION: 1. No acute osseous findings. 2. Osteopenia. Electronically Signed   By: Signa Kell M.D.   On: 06/21/2023 07:52   DG HIP  UNILAT WITH PELVIS 2-3 VIEWS RIGHT  Result Date: 06/21/2023 CLINICAL DATA:  [id number] Fall [id number] EXAM: DG HIP (WITH OR WITHOUT PELVIS) 2-3V RIGHT COMPARISON:  CT abdomen pelvis 10/02/2017. FINDINGS: Shortening of the right femoral neck with associated poorly visualized lucency along the neck/head consistent with an acute fracture. No right hip dislocation peer frontal view of the left hip demonstrates no acute fracture or dislocation. Acute displaced fracture or diastasis of the bones of the pelvis. There is no evidence of severe arthropathy or other focal bone abnormality. Kyphoplasty noted of the  lumbar spine. IMPRESSION: Acute right femoral neck/neck fracture poorly visualized. Electronically Signed   By: Tish Frederickson M.D.   On: 06/21/2023 03:16   DG Chest Port 1 View  Result Date: 06/21/2023 CLINICAL DATA:  fall EXAM: PORTABLE CHEST 1 VIEW COMPARISON:  CT abdomen pelvis 10/02/2017 FINDINGS: The heart and mediastinal contours are within normal limits. Aortic calcification. Hyperinflation of the lungs. No focal consolidation. Coarsened interstitial markings with no overt pulmonary edema. No pleural effusion. No pneumothorax. No acute osseous abnormality. IMPRESSION: 1. No active disease. 2. Aortic Atherosclerosis (ICD10-I70.0) and Emphysema (ICD10-J43.9). Electronically Signed   By: Tish Frederickson M.D.   On: 06/21/2023 03:14   CT HEAD WO CONTRAST ( )  Result Date: 06/21/2023 CLINICAL DATA:  Polytrauma, blunt EXAM: CT HEAD WITHOUT CONTRAST CT CERVICAL SPINE WITHOUT CONTRAST TECHNIQUE: Multidetector CT imaging of the head and cervical spine was performed following the standard protocol without intravenous contrast. Multiplanar CT image reconstructions of the cervical spine were also generated. RADIATION DOSE REDUCTION: This exam was performed according to the departmental dose-optimization program which includes automated exposure control, adjustment of the mA and/or kV according to patient size and/or  use of iterative reconstruction technique. COMPARISON:  CT head 12/09/2022 FINDINGS: CT HEAD FINDINGS Brain: Cerebral ventricle sizes are concordant with the degree of cerebral volume loss. Patchy and confluent areas of decreased attenuation are noted throughout the deep and periventricular white matter of the cerebral hemispheres bilaterally, compatible with chronic microvascular ischemic disease. No evidence of large-territorial acute infarction. No parenchymal hemorrhage. No mass lesion. No extra-axial collection. No mass effect or midline shift. No hydrocephalus. Basilar cisterns are patent. Vascular: No hyperdense vessel. Skull: No acute fracture or focal lesion. Sinuses/Orbits: Right sphenoid sinus mucosal thickening with calcifications. Paranasal sinuses and mastoid air cells are clear. Bilateral lens replacement. Otherwise the orbits are unremarkable. Other: None. CT CERVICAL SPINE FINDINGS Alignment: Grade 1 anterolisthesis of C3 on C4 and C4 on C5. Skull base and vertebrae: Multilevel moderate severe degenerative changes spine most prominent at the C6-C7 level. Associated multilevel severe osseous neural foraminal stenosis. No severe osseous central canal stenosis. No acute fracture. No aggressive appearing focal osseous lesion or focal pathologic process. Soft tissues and spinal canal: No prevertebral fluid or swelling. No visible canal hematoma. Upper chest: Unremarkable. Other: None. IMPRESSION: 1. No acute intracranial abnormality. 2. No acute displaced fracture or traumatic listhesis of the cervical spine. 3. Right sphenoid sinus mucosal thickening with calcifications. Findings suggestive of chronic versus fungal sinusitis. Electronically Signed   By: Tish Frederickson M.D.   On: 06/21/2023 03:12   CT Cervical Spine Wo Contrast  Result Date: 06/21/2023 CLINICAL DATA:  Polytrauma, blunt EXAM: CT HEAD WITHOUT CONTRAST CT CERVICAL SPINE WITHOUT CONTRAST TECHNIQUE: Multidetector CT imaging of the head  and cervical spine was performed following the standard protocol without intravenous contrast. Multiplanar CT image reconstructions of the cervical spine were also generated. RADIATION DOSE REDUCTION: This exam was performed according to the departmental dose-optimization program which includes automated exposure control, adjustment of the mA and/or kV according to patient size and/or use of iterative reconstruction technique. COMPARISON:  CT head 12/09/2022 FINDINGS: CT HEAD FINDINGS Brain: Cerebral ventricle sizes are concordant with the degree of cerebral volume loss. Patchy and confluent areas of decreased attenuation are noted throughout the deep and periventricular white matter of the cerebral hemispheres bilaterally, compatible with chronic microvascular ischemic disease. No evidence of large-territorial acute infarction. No parenchymal hemorrhage. No mass lesion. No extra-axial collection. No mass effect or midline  shift. No hydrocephalus. Basilar cisterns are patent. Vascular: No hyperdense vessel. Skull: No acute fracture or focal lesion. Sinuses/Orbits: Right sphenoid sinus mucosal thickening with calcifications. Paranasal sinuses and mastoid air cells are clear. Bilateral lens replacement. Otherwise the orbits are unremarkable. Other: None. CT CERVICAL SPINE FINDINGS Alignment: Grade 1 anterolisthesis of C3 on C4 and C4 on C5. Skull base and vertebrae: Multilevel moderate severe degenerative changes spine most prominent at the C6-C7 level. Associated multilevel severe osseous neural foraminal stenosis. No severe osseous central canal stenosis. No acute fracture. No aggressive appearing focal osseous lesion or focal pathologic process. Soft tissues and spinal canal: No prevertebral fluid or swelling. No visible canal hematoma. Upper chest: Unremarkable. Other: None. IMPRESSION: 1. No acute intracranial abnormality. 2. No acute displaced fracture or traumatic listhesis of the cervical spine. 3. Right  sphenoid sinus mucosal thickening with calcifications. Findings suggestive of chronic versus fungal sinusitis. Electronically Signed   By: Tish Frederickson M.D.   On: 06/21/2023 03:12      Signature  -   Susa Raring M.D on 06/23/2023 at 8:46 AM   -  To page go to [email or web address]

## 2023-06-24 DIAGNOSIS — S72001A Fracture of unspecified part of neck of right femur, initial encounter for closed fracture: Secondary | ICD-10-CM | POA: Diagnosis not present

## 2023-06-24 LAB — CBC WITH DIFFERENTIAL/PLATELET
Abs Immature Granulocytes: 0.06 10*3/uL (ref 0.00–0.07)
Basophils Absolute: 0.1 10*3/uL (ref 0.0–0.1)
Basophils Relative: 1 %
Eosinophils Absolute: 1.1 10*3/uL — ABNORMAL HIGH (ref 0.0–0.5)
Eosinophils Relative: 9 %
HCT: 34.1 % — ABNORMAL LOW (ref 36.0–46.0)
Hemoglobin: 10.7 g/dL — ABNORMAL LOW (ref 12.0–15.0)
Immature Granulocytes: 1 %
Lymphocytes Relative: 10 %
Lymphs Abs: 1.4 10*3/uL (ref 0.7–4.0)
MCH: 30.9 pg (ref 26.0–34.0)
MCHC: 31.4 g/dL (ref 30.0–36.0)
MCV: 98.6 fL (ref 80.0–100.0)
Monocytes Absolute: 0.9 10*3/uL (ref 0.1–1.0)
Monocytes Relative: 7 %
Neutro Abs: 9.5 10*3/uL — ABNORMAL HIGH (ref 1.7–7.7)
Neutrophils Relative %: 72 %
Platelets: 223 10*3/uL (ref 150–400)
RBC: 3.46 MIL/uL — ABNORMAL LOW (ref 3.87–5.11)
RDW: 13.5 % (ref 11.5–15.5)
WBC: 13 10*3/uL — ABNORMAL HIGH (ref 4.0–10.5)
nRBC: 0 % (ref 0.0–0.2)

## 2023-06-24 LAB — BASIC METABOLIC PANEL
Anion gap: 7 (ref 5–15)
BUN: 27 mg/dL — ABNORMAL HIGH (ref 8–23)
CO2: 27 mmol/L (ref 22–32)
Calcium: 8.1 mg/dL — ABNORMAL LOW (ref 8.9–10.3)
Chloride: 100 mmol/L (ref 98–111)
Creatinine, Ser: 0.57 mg/dL (ref 0.44–1.00)
GFR, Estimated: >60 mL/min (ref >60)
Glucose, Bld: 106 mg/dL — ABNORMAL HIGH (ref 70–99)
Potassium: 3.7 mmol/L (ref 3.5–5.1)
Sodium: 134 mmol/L — ABNORMAL LOW (ref 135–145)

## 2023-06-24 LAB — MAGNESIUM: Magnesium: 1.9 mg/dL (ref 1.7–2.4)

## 2023-06-24 LAB — BRAIN NATRIURETIC PEPTIDE: B Natriuretic Peptide: 167.9 pg/mL — ABNORMAL HIGH (ref 0.0–100.0)

## 2023-06-24 MED ORDER — FUROSEMIDE 20 MG PO TABS
20.0000 mg | ORAL_TABLET | Freq: Once | ORAL | Status: AC
  Administered 2023-06-24: 20 mg via ORAL
  Filled 2023-06-24: qty 1

## 2023-06-24 MED ORDER — POTASSIUM CHLORIDE CRYS ER 20 MEQ PO TBCR
40.0000 meq | EXTENDED_RELEASE_TABLET | Freq: Once | ORAL | Status: AC
  Administered 2023-06-24: 40 meq via ORAL
  Filled 2023-06-24: qty 2

## 2023-06-24 NOTE — TOC Progression Note (Addendum)
Transition of Care [facility]) - Progression Note    Patient Details  Name: Frances Ortiz MRN: [id number] Date of Birth: 10-31-1930  [facility] CM/SW Contact  Mearl Latin, LCSW Phone Number: 06/24/2023, 8:54 AM  Clinical Narrative:    CSW sent requests to [facility] and [facility] admissions to review patient. Unsure if they are open today for the holiday.  Per [facility], they do not have any beds available this week as of yet.  Per [facility], they should have a bed opening tomorrow potentially.   CSW spoke with patient's daughter, [name], and provided update. She reported she is fine with [facility] if [facility] does not have anything opening.    Expected Discharge Plan: Skilled Nursing Facility Barriers to Discharge: Continued Medical Work up  Expected Discharge Plan and Services     Post Acute Care Choice: Skilled Nursing Facility Living arrangements for the past 2 months: Single Family Home                                       Social Determinants of Health (SDOH) Interventions SDOH Screenings   Food Insecurity: No Food Insecurity (06/21/2023)  Housing: Patient Declined (06/21/2023)  Transportation Needs: No Transportation Needs (06/21/2023)  Utilities: Not At Risk (06/21/2023)  Tobacco Use: Low Risk  (06/22/2023)    Readmission Risk Interventions     No data to display

## 2023-06-24 NOTE — Progress Notes (Signed)
PROGRESS NOTE                                                                                                                                                                                                             Patient Demographics:    Frances Ortiz, is a 89 y.o. female, DOB - 1931/04/23, VHQ:[id number]  Outpatient Primary MD for the patient is Irven Coe, MD    LOS - 3  Admit date - 06/21/2023    Chief Complaint  Patient presents with   Fall       Brief Narrative (HPI from H&P)   89 y.o. female with medical history significant of hyperlipidemia, GERD, anxiety/depression, cecal cancer status post right colectomy presented to the ED complaining of right hip pain after a mechanical fall.  Workup in the ER showed acute right femoral neck fracture and she was admitted to the hospital.   Subjective:   Patient in bed, appears comfortable, denies any headache, no fever, no chest pain or pressure, no shortness of breath , no abdominal pain. No new focal weakness.   Assessment  & Plan :   Acute right femoral neck fracture - Secondary to a mechanical fall. Seen by Dr Lorretta Harp pots ORIF 06/22/23, weight bearing as tolerated, Lovenox for DVT prophylaxis.  Advance activity PT OT, will need placement to SNF.   Sinus tachycardia - Likely related to pain and anxiety.  Continue IV fluid hydration.  Continue management of pain and anxiety.   Elevated blood pressure readings - Placed on oral beta-blocker , added Norvasc along with as needed IV as needed.   ?Chronic versus fungal sinusitis - Did on CT scan no symptoms outpatient follow-up with PCP and ENT as needed.   Anxiety/depression - Continue Zoloft.      Condition -  fair  Family Communication  : Daughter [name] [phone]  bedside on 06/22/2023, phone 06/23/23  Code Status : Full code  Consults  : Orthopedics  PUD Prophylaxis :    Procedures  :     Right hip cemented  hemiarthroplasty by Dr. Marene Lenz on 06/22/2023      Disposition Plan  :    Status is: Inpatient   DVT Prophylaxis  :    enoxaparin (LOVENOX) injection 40 mg Start: 06/23/23 0800 SCDs Start: 06/22/23 1137 SCDs Start: 06/21/23 7253  Lab Results  Component Value Date   PLT 223 06/24/2023    Diet :  Diet Order             Diet regular Room service appropriate? Yes; Fluid consistency: Thin  Diet effective now                    Inpatient Medications  Scheduled Meds:  amLODipine  5 mg Oral Daily   docusate sodium  100 mg Oral BID   enoxaparin (LOVENOX) injection  40 mg Subcutaneous Q24H   feeding supplement  237 mL Oral BID BM   ferrous sulfate  325 mg Oral TID PC   metoprolol tartrate  50 mg Oral BID   multivitamin with minerals  1 tablet Oral Daily   psyllium  1 packet Oral Daily   sertraline  50 mg Oral Daily   Continuous Infusions:  methocarbamol (ROBAXIN) IV     PRN Meds:.acetaminophen, alum & mag hydroxide-simeth, bisacodyl, hydrALAZINE, magnesium citrate, menthol-cetylpyridinium **OR** phenol, methocarbamol **OR** methocarbamol (ROBAXIN) IV, morphine injection, naLOXone (NARCAN)  injection, ondansetron **OR** ondansetron (ZOFRAN) IV, polyethylene glycol, traMADol    Objective:   Vitals:   06/23/23 1205 06/23/23 2004 06/23/23 2320 06/24/23 0336  BP: 132/62 (!) 127/55 122/66 (!) 142/69  Pulse:  80 75 75  Resp:  18 17 16   Temp: 98.3 F (36.8 C) 98.2 F (36.8 C) 98.2 F (36.8 C) 98.2 F (36.8 C)  TempSrc:  Oral Oral Oral  SpO2:      Weight:      Height:        Wt Readings from Last 3 Encounters:  06/22/23 61 kg  02/25/23 61 kg  04/18/22 60.4 kg     Intake/Output Summary (Last 24 hours) at 06/24/2023 0757 Last data filed at 06/23/2023 0825 Gross per 24 hour  Intake 240 ml  Output --  Net 240 ml     Physical Exam  Awake Alert, No new F.N deficits, Normal affect [place].AT,PERRAL Supple Neck, No JVD,   Symmetrical Chest wall  movement, Good air movement bilaterally, CTAB RRR,No Gallops,Rubs or new Murmurs,  +ve B.Sounds, Abd Soft, No tenderness,   Right hip postop site stable    Data Review:    Recent Labs  Lab 06/21/23 0257 06/21/23 0634 06/22/23 1155 06/23/23 0625 06/24/23 0235  WBC 19.6* 23.0* 19.6* 15.7* 13.0*  HGB 13.6 13.2 12.6 11.0* 10.7*  HCT 42.0 39.0 38.7 34.2* 34.1*  PLT 272 245 217 215 223  MCV 96.3 94.4 99.2 97.7 98.6  MCH 31.2 32.0 32.3 31.4 30.9  MCHC 32.4 33.8 32.6 32.2 31.4  RDW 13.1 13.1 13.2 13.5 13.5  LYMPHSABS  --   --  0.6* 1.6 1.4  MONOABS  --   --  0.5 1.2* 0.9  EOSABS  --   --  0.0 0.2 1.1*  BASOSABS  --   --  0.0 0.0 0.1    Recent Labs  Lab 06/21/23 0257 06/22/23 1155 06/22/23 1209 06/23/23 0226 06/23/23 0625 06/24/23 0235  NA 139 139  --  138  --  134*  K 3.7 4.3  --  4.7  --  3.7  CL 104 101  --  104  --  100  CO2 24 23  --  23  --  27  ANIONGAP 11 15  --  11  --  7  GLUCOSE 109* 134*  --  121*  --  106*  BUN 22 22  --  33*  --  27*  CREATININE 0.62 0.61  --  0.65  --  0.57  PROCALCITON  --  <0.10  --   --   --   --   BNP  --   --  222.5*  --  190.5* 167.9*  MG  --   --   --  2.0  --  1.9  CALCIUM 9.0 8.8*  --  8.8*  --  8.1*      Recent Labs  Lab 06/21/23 0257 06/22/23 1155 06/22/23 1209 06/23/23 0226 06/23/23 0625 06/24/23 0235  PROCALCITON  --  <0.10  --   --   --   --   BNP  --   --  222.5*  --  190.5* 167.9*  MG  --   --   --  2.0  --  1.9  CALCIUM 9.0 8.8*  --  8.8*  --  8.1*    Radiology Reports DG HIP UNILAT W OR W/O PELVIS 2-3 VIEWS RIGHT  Result Date: 06/22/2023 CLINICAL DATA:  Status post right hip cemented hemiarthroplasty for right hip fracture. EXAM: DG HIP (WITH OR WITHOUT PELVIS) 2-3V RIGHT COMPARISON:  Right hip radiographs 06/21/2023, CT right hip 06/21/2023 FINDINGS: Interval right hip hemiarthroplasty. No perihardware lucency is seen to be to indicate hardware failure or loosening. Expected postoperative right hip  subcutaneous air. Mild bilateral sacroiliac subchondral sclerosis. Mild left superomedial femoroacetabular joint space narrowing. IMPRESSION: Interval right hip hemiarthroplasty without evidence of hardware failure or loosening. Electronically Signed   By: Neita Garnet M.D.   On: 06/22/2023 11:43   CT HIP RIGHT WO CONTRAST  Result Date: 06/21/2023 CLINICAL DATA:  Right femoral neck fracture.  Fell while walking. EXAM: CT OF THE RIGHT HIP WITHOUT CONTRAST TECHNIQUE: Multidetector CT imaging of the right hip was performed according to the standard protocol. Multiplanar CT image reconstructions were also generated. RADIATION DOSE REDUCTION: This exam was performed according to the departmental dose-optimization program which includes automated exposure control, adjustment of the mA and/or kV according to patient size and/or use of iterative reconstruction technique. COMPARISON:  Right hip x-rays from same day. FINDINGS: Bones/Joint/Cartilage Acute impacted right subcapital femoral neck fracture. No dislocation. Mild right hip joint space narrowing with small marginal osteophytes. No significant joint effusion. Ligaments Ligaments are suboptimally evaluated by CT. Muscles and Tendons Grossly intact.  Gluteus minimus and medius muscle atrophy. Soft tissue Soft tissue swelling along the posterolateral hip with small 2.0 x 1.6 x 1.8 cm hematoma. No soft tissue mass. Small fat containing right inguinal hernia. IMPRESSION: 1. Acute impacted right subcapital femoral neck fracture. 2. Soft tissue swelling along the posterolateral hip with small 2.0 cm hematoma. Electronically Signed   By: Obie Dredge M.D.   On: 06/21/2023 07:52   DG Knee Right Port  Result Date: 06/21/2023 CLINICAL DATA:  Right hip fracture after ground level fall. EXAM: PORTABLE RIGHT KNEE - 1-2 VIEW COMPARISON:  None Available. FINDINGS: Osteopenia. There is no joint effusion. There is no sign of acute fracture or dislocation. Soft tissues are  unremarkable. IMPRESSION: 1. No acute osseous findings. 2. Osteopenia. Electronically Signed   By: Signa Kell M.D.   On: 06/21/2023 07:52   DG HIP UNILAT WITH PELVIS 2-3 VIEWS RIGHT  Result Date: 06/21/2023 CLINICAL DATA:  [id number] Fall [id number] EXAM: DG HIP (WITH OR WITHOUT PELVIS) 2-3V RIGHT COMPARISON:  CT abdomen pelvis 10/02/2017. FINDINGS: Shortening of the right femoral neck with associated poorly visualized lucency along the neck/head consistent with an acute fracture. No  right hip dislocation peer frontal view of the left hip demonstrates no acute fracture or dislocation. Acute displaced fracture or diastasis of the bones of the pelvis. There is no evidence of severe arthropathy or other focal bone abnormality. Kyphoplasty noted of the lumbar spine. IMPRESSION: Acute right femoral neck/neck fracture poorly visualized. Electronically Signed   By: Tish Frederickson M.D.   On: 06/21/2023 03:16   DG Chest Port 1 View  Result Date: 06/21/2023 CLINICAL DATA:  fall EXAM: PORTABLE CHEST 1 VIEW COMPARISON:  CT abdomen pelvis 10/02/2017 FINDINGS: The heart and mediastinal contours are within normal limits. Aortic calcification. Hyperinflation of the lungs. No focal consolidation. Coarsened interstitial markings with no overt pulmonary edema. No pleural effusion. No pneumothorax. No acute osseous abnormality. IMPRESSION: 1. No active disease. 2. Aortic Atherosclerosis (ICD10-I70.0) and Emphysema (ICD10-J43.9). Electronically Signed   By: Tish Frederickson M.D.   On: 06/21/2023 03:14   CT HEAD WO CONTRAST ( )  Result Date: 06/21/2023 CLINICAL DATA:  Polytrauma, blunt EXAM: CT HEAD WITHOUT CONTRAST CT CERVICAL SPINE WITHOUT CONTRAST TECHNIQUE: Multidetector CT imaging of the head and cervical spine was performed following the standard protocol without intravenous contrast. Multiplanar CT image reconstructions of the cervical spine were also generated. RADIATION DOSE REDUCTION: This exam was performed according  to the departmental dose-optimization program which includes automated exposure control, adjustment of the mA and/or kV according to patient size and/or use of iterative reconstruction technique. COMPARISON:  CT head 12/09/2022 FINDINGS: CT HEAD FINDINGS Brain: Cerebral ventricle sizes are concordant with the degree of cerebral volume loss. Patchy and confluent areas of decreased attenuation are noted throughout the deep and periventricular white matter of the cerebral hemispheres bilaterally, compatible with chronic microvascular ischemic disease. No evidence of large-territorial acute infarction. No parenchymal hemorrhage. No mass lesion. No extra-axial collection. No mass effect or midline shift. No hydrocephalus. Basilar cisterns are patent. Vascular: No hyperdense vessel. Skull: No acute fracture or focal lesion. Sinuses/Orbits: Right sphenoid sinus mucosal thickening with calcifications. Paranasal sinuses and mastoid air cells are clear. Bilateral lens replacement. Otherwise the orbits are unremarkable. Other: None. CT CERVICAL SPINE FINDINGS Alignment: Grade 1 anterolisthesis of C3 on C4 and C4 on C5. Skull base and vertebrae: Multilevel moderate severe degenerative changes spine most prominent at the C6-C7 level. Associated multilevel severe osseous neural foraminal stenosis. No severe osseous central canal stenosis. No acute fracture. No aggressive appearing focal osseous lesion or focal pathologic process. Soft tissues and spinal canal: No prevertebral fluid or swelling. No visible canal hematoma. Upper chest: Unremarkable. Other: None. IMPRESSION: 1. No acute intracranial abnormality. 2. No acute displaced fracture or traumatic listhesis of the cervical spine. 3. Right sphenoid sinus mucosal thickening with calcifications. Findings suggestive of chronic versus fungal sinusitis. Electronically Signed   By: Tish Frederickson M.D.   On: 06/21/2023 03:12   CT Cervical Spine Wo Contrast  Result Date:  06/21/2023 CLINICAL DATA:  Polytrauma, blunt EXAM: CT HEAD WITHOUT CONTRAST CT CERVICAL SPINE WITHOUT CONTRAST TECHNIQUE: Multidetector CT imaging of the head and cervical spine was performed following the standard protocol without intravenous contrast. Multiplanar CT image reconstructions of the cervical spine were also generated. RADIATION DOSE REDUCTION: This exam was performed according to the departmental dose-optimization program which includes automated exposure control, adjustment of the mA and/or kV according to patient size and/or use of iterative reconstruction technique. COMPARISON:  CT head 12/09/2022 FINDINGS: CT HEAD FINDINGS Brain: Cerebral ventricle sizes are concordant with the degree of cerebral volume loss. Patchy and confluent areas  of decreased attenuation are noted throughout the deep and periventricular white matter of the cerebral hemispheres bilaterally, compatible with chronic microvascular ischemic disease. No evidence of large-territorial acute infarction. No parenchymal hemorrhage. No mass lesion. No extra-axial collection. No mass effect or midline shift. No hydrocephalus. Basilar cisterns are patent. Vascular: No hyperdense vessel. Skull: No acute fracture or focal lesion. Sinuses/Orbits: Right sphenoid sinus mucosal thickening with calcifications. Paranasal sinuses and mastoid air cells are clear. Bilateral lens replacement. Otherwise the orbits are unremarkable. Other: None. CT CERVICAL SPINE FINDINGS Alignment: Grade 1 anterolisthesis of C3 on C4 and C4 on C5. Skull base and vertebrae: Multilevel moderate severe degenerative changes spine most prominent at the C6-C7 level. Associated multilevel severe osseous neural foraminal stenosis. No severe osseous central canal stenosis. No acute fracture. No aggressive appearing focal osseous lesion or focal pathologic process. Soft tissues and spinal canal: No prevertebral fluid or swelling. No visible canal hematoma. Upper chest:  Unremarkable. Other: None. IMPRESSION: 1. No acute intracranial abnormality. 2. No acute displaced fracture or traumatic listhesis of the cervical spine. 3. Right sphenoid sinus mucosal thickening with calcifications. Findings suggestive of chronic versus fungal sinusitis. Electronically Signed   By: Tish Frederickson M.D.   On: 06/21/2023 03:12      Signature  -   Susa Raring M.D on 06/24/2023 at 7:57 AM   -  To page go to [email or web address]

## 2023-06-24 NOTE — Progress Notes (Signed)
Physical Therapy Treatment Patient Details Name: Frances Ortiz MRN: [id number] DOB: 07/24/1931 Today's Date: 06/24/2023   History of Present Illness 89 y.o. female presented to the ED 06/21/23 complaining of right hip pain after a mechanical fall. +right femoral neck fracture.  CT head/C-spine negative for acute finding. 8/31 Rt hip hemiarthroplasty (posterior approach).  PMH significant of hyperlipidemia, anxiety/depression, cecal cancer status post right colectomy    PT Comments  Patient continues to be limited by pain (rt hip >left). She requires +2 assist for all mobility and continues to be unable to advance either foot when standing. She tried to "shimmy" each foot with very little movement created. Ultimately use of stedy for OOB to chair. She continues to be appropriate for post-acute therapies at intensity of <3 hours per day.     If plan is discharge home, recommend the following: Two people to help with walking and/or transfers;A lot of help with bathing/dressing/bathroom;Assistance with cooking/housework;Assist for transportation;Help with stairs or ramp for entrance   Can travel by private vehicle     No  Equipment Recommendations  None recommended by PT    Recommendations for Other Services       Precautions / Restrictions Precautions Precautions: Posterior Hip;Fall Precaution Booklet Issued: Yes (comment) Restrictions Weight Bearing Restrictions: No RLE Weight Bearing: Weight bearing as tolerated     Mobility  Bed Mobility Overal bed mobility: Needs Assistance Bed Mobility: Supine to Sit, Rolling     Supine to sit: Max assist, HOB elevated, Used rails, +2 for safety/equipment     General bed mobility comments: pt again max assist wiht drawsheet to laterally scoot in supine prior to supine to sit; pt assists with moving LLE and poor effort to raise torso    Transfers Overall transfer level: Needs assistance Equipment used: Rolling walker (2  wheels) Transfers: Sit to/from Stand, Bed to chair/wheelchair/BSC Sit to Stand: Min assist, +2 physical assistance, From elevated surface, Via lift equipment Stand pivot transfers: Min assist, +2 physical assistance (with stedy)         General transfer comment: assist for anterior translation and upward boost;  again unable to advance either foot (wearing hospital socks) even when assisted to wt-shift laterally to either side; returned to sit EOB and stedy used for bed to chair Transfer via Lift Equipment: Stedy  Ambulation/Gait               General Gait Details: unable to advance either leg   Stairs             Wheelchair Mobility     Tilt Bed    Modified Rankin (Stroke Patients Only)       Balance Overall balance assessment: Needs assistance Sitting-balance support: No upper extremity supported, Feet supported Sitting balance-Leahy Scale: Fair     Standing balance support: Bilateral upper extremity supported, Reliant on assistive device for balance Standing balance-Leahy Scale: Poor                              Cognition Arousal: Alert Behavior During Therapy: Anxious Overall Cognitive Status: Within Functional Limits for tasks assessed                                          Exercises      General Comments General comments (skin integrity, edema, etc.): Private caregiver  present      Pertinent Vitals/Pain Pain Assessment Pain Assessment: Faces Faces Pain Scale: Hurts even more Pain Location: Rt outer hip Pain Descriptors / Indicators: Aching, Grimacing, Guarding Pain Intervention(s): Limited activity within patient's tolerance, Monitored during session    Home Living                          Prior Function            PT Goals (current goals can now be found in the care plan section) Acute Rehab PT Goals Patient Stated Goal: return home Time For Goal Achievement: 07/07/23 Potential to  Achieve Goals: Good Progress towards PT goals: Progressing toward goals    Frequency    Min 1X/week      PT Plan      Co-evaluation              AM-PAC PT "6 Clicks" Mobility   Outcome Measure  Help needed turning from your back to your side while in a flat bed without using bedrails?: Total Help needed moving from lying on your back to sitting on the side of a flat bed without using bedrails?: Total Help needed moving to and from a bed to a chair (including a wheelchair)?: Total Help needed standing up from a chair using your arms (e.g., wheelchair or bedside chair)?: Total Help needed to walk in hospital room?: Total Help needed climbing 3-5 steps with a railing? : Total 6 Click Score: 6    End of Session Equipment Utilized During Treatment: Gait belt;Oxygen Activity Tolerance: Patient limited by pain Patient left: with call bell/phone within reach;with family/visitor present;in chair;with chair alarm set Nurse Communication: Mobility status;Need for lift equipment PT Visit Diagnosis: Other abnormalities of gait and mobility (R26.89);History of falling (Z91.81);Muscle weakness (generalized) (M62.81);Pain Pain - Right/Left:  (bil) Pain - part of body: Hip     Time: 1004-1030 PT Time Calculation (min) (ACUTE ONLY): 26 min  Charges:    $Gait Training: 23-37 mins PT General Charges $$ ACUTE PT VISIT: 1 Visit                      Jerolyn Center, PT Acute Rehabilitation Services  Office [phone]    Zena Amos 06/24/2023, 11:40 AM

## 2023-06-24 NOTE — Anesthesia Postprocedure Evaluation (Signed)
Anesthesia Post Note  Patient: Frances Ortiz  Procedure(s) Performed: POSTERIOR APPROACH HEMI HIP ARTHROPLASTY (Right)     Patient location during evaluation: PACU Anesthesia Type: General Level of consciousness: awake and alert Pain management: pain level controlled Vital Signs Assessment: post-procedure vital signs reviewed and stable Respiratory status: spontaneous breathing, nonlabored ventilation, respiratory function stable and patient connected to nasal cannula oxygen Cardiovascular status: blood pressure returned to baseline and stable Postop Assessment: no apparent nausea or vomiting Anesthetic complications: no   There were no known notable events for this encounter.  Last Vitals:  Vitals:   06/24/23 0336 06/24/23 0844  BP: (!) 142/69 126/88  Pulse: 75 78  Resp: 16 16  Temp: 36.8 C 36.9 C  SpO2:  97%    Last Pain:  Vitals:   06/24/23 0844  TempSrc: Oral  PainSc: 0-No pain                 Joniyah Mallinger S

## 2023-06-24 NOTE — Evaluation (Signed)
Occupational Therapy Evaluation Patient Details Name: Frances Ortiz MRN: [id number] DOB: February 08, 1931 Today's Date: 06/24/2023   History of Present Illness 89 y.o. female presented to the ED 06/21/23 complaining of right hip pain after a mechanical fall. +right femoral neck fracture.  CT head/C-spine negative for acute finding. 8/31 Rt hip hemiarthroplasty (posterior approach).  PMH significant of hyperlipidemia, anxiety/depression, cecal cancer status post right colectomy   Clinical Impression   Pt admitted for above, PTA pt ambulating mod I with RW and had assist with ADLs/iADLs from PCAs. Pt currently limited by RLE pain and weakness, needs Mod A for STS and not able to produce steps for gait. Pt also displays decreased recall of posterior hip precautions. Educated pt on the use of hip kit to complete ADLs while maintaining precautions, she demonstrated good use of the reacher but the sockaid presented with more of a challenge. Pt would benefit form continued acute skilled OT services to address deficits and help transition to next level of care. Patient would benefit from post acute skilled rehab facility with <3 hours of therapy and 24/7 support       If plan is discharge home, recommend the following: A lot of help with walking and/or transfers;A lot of help with bathing/dressing/bathroom;Assistance with cooking/housework;Help with stairs or ramp for entrance    Functional Status Assessment  Patient has had a recent decline in their functional status and demonstrates the ability to make significant improvements in function in a reasonable and predictable amount of time.  Equipment Recommendations  None recommended by OT;BSC/3in1    Recommendations for Other Services       Precautions / Restrictions Precautions Precautions: Posterior Hip;Fall Precaution Booklet Issued: Yes (comment) Precaution Comments: pt able to recall 1/3, needs reinforcement Restrictions Weight Bearing  Restrictions: No RLE Weight Bearing: Weight bearing as tolerated      Mobility Bed Mobility Overal bed mobility: Needs Assistance Bed Mobility: Rolling, Sidelying to Sit, Sit to Supine Rolling: Max assist, Used rails Sidelying to sit: Max assist, HOB elevated, Used rails   Sit to supine: Mod assist   General bed mobility comments: Mod A to help with returning BLEs to bed. Pt with slow initiation of RLE movement during bed mobility    Transfers Overall transfer level: Needs assistance Equipment used: Rolling walker (2 wheels) Transfers: Sit to/from Stand Sit to Stand: Mod assist           General transfer comment: cues for leg extension during transitions to maintain hip precautions. Not able to offload, can heel shuffle RLE.      Balance Overall balance assessment: Needs assistance Sitting-balance support: No upper extremity supported, Feet supported Sitting balance-Leahy Scale: Fair     Standing balance support: Bilateral upper extremity supported, Reliant on assistive device for balance Standing balance-Leahy Scale: Poor                             ADL either performed or assessed with clinical judgement   ADL Overall ADL's : Needs assistance/impaired Eating/Feeding: Independent;Bed level   Grooming: Contact guard assist;Sitting   Upper Body Bathing: Set up;Sitting   Lower Body Bathing: Maximal assistance   Upper Body Dressing : Set up;Supervision/safety;Sitting   Lower Body Dressing: Moderate assistance;With adaptive equipment;Sitting/lateral leans Lower Body Dressing Details (indicate cue type and reason): Educated pt on the use of AE to complete LBD, needed mod A for setup and use of sockaid   Toilet Transfer Details (indicate  cue type and reason): not able at this time   Toileting - Clothing Manipulation Details (indicate cue type and reason): NT       General ADL Comments: Pt able to recall 1/3 hip precautions, Educated pt on hip kit and  she successfully picked up 3/3 items using reacher while maintaining precautions. She does need reinforcement on hip kit to promote carryover     Vision         Perception         Praxis         Pertinent Vitals/Pain Pain Assessment Pain Assessment: 0-10 Pain Score: 8  Pain Location: Rt hip Pain Descriptors / Indicators: Aching, Grimacing, Guarding, Discomfort Pain Intervention(s): Limited activity within patient's tolerance, Monitored during session, Repositioned     Extremity/Trunk Assessment Upper Extremity Assessment Upper Extremity Assessment: Generalized weakness   Lower Extremity Assessment RLE Deficits / Details: AAROM hip flexion to 90; strength assessment limited by pain; tendency to IR at rest RLE: Unable to fully assess due to pain RLE Sensation: WNL LLE Deficits / Details: AAROM limited to hip flexion of 90 due to pain in left groin "arthritis" per pt; strength grossly 4/5   Cervical / Trunk Assessment Cervical / Trunk Assessment: Kyphotic   Communication Communication Communication: Hearing impairment Cueing Techniques: Verbal cues   Cognition Arousal: Alert Behavior During Therapy: WFL for tasks assessed/performed Overall Cognitive Status: Impaired/Different from baseline Area of Impairment: Memory                     Memory: Decreased recall of precautions               General Comments  VSS on 2L    Exercises     Shoulder Instructions      Home Living Family/patient expects to be discharged to:: Private residence Living Arrangements: Alone Available Help at Discharge: Family;Personal care attendant;Available PRN/intermittently Type of Home: House Home Access: Stairs to enter Entrance Stairs-Number of Steps: 1+1 Entrance Stairs-Rails: None Home Layout: One level     Bathroom Shower/Tub: Producer, television/film/video: Standard     Home Equipment: Shower seat;Cane - single point;Rollator (4 wheels);Rolling Walker (2  wheels);Lift chair   Additional Comments: caregiver from 8:30-12:30 and again in the evening (pt not sure of times). 2 falls due to losing balance      Prior Functioning/Environment Prior Level of Function : Needs assist;History of Falls (last six months)             Mobility Comments: ambulating modified independent with RW; needs assistance managing RW up/down steps; has been sleeping in lift chair since back injury/kyphoplasty 02/2023 ADLs Comments: Occasional assist with bathing and dressing. Assist with iADLs, ind with med management.        OT Problem List: Decreased activity tolerance;Impaired balance (sitting and/or standing);Decreased strength;Decreased knowledge of precautions;Pain      OT Treatment/Interventions: Self-care/ADL training;Balance training;Therapeutic exercise;Therapeutic activities;DME and/or AE instruction;Patient/family education    OT Goals(Current goals can be found in the care plan section) Acute Rehab OT Goals Patient Stated Goal: To get better OT Goal Formulation: With patient Time For Goal Achievement: 07/08/23 Potential to Achieve Goals: Good ADL Goals Pt Will Perform Grooming: sitting;with set-up Pt Will Perform Lower Body Bathing: with supervision;with set-up;with adaptive equipment;sitting/lateral leans Pt Will Perform Lower Body Dressing: with set-up;with supervision;with adaptive equipment;sitting/lateral leans Pt Will Transfer to Toilet: with min assist;stand pivot transfer;bedside commode Additional ADL Goal #1: Pt will independently recall 3/3  posterior hip precautions  OT Frequency: Min 1X/week    Co-evaluation              AM-PAC OT "6 Clicks" Daily Activity     Outcome Measure Help from another person eating meals?: None Help from another person taking care of personal grooming?: A Little Help from another person toileting, which includes using toliet, bedpan, or urinal?: A Lot Help from another person bathing (including  washing, rinsing, drying)?: A Lot Help from another person to put on and taking off regular upper body clothing?: A Little Help from another person to put on and taking off regular lower body clothing?: A Lot 6 Click Score: 16   End of Session Equipment Utilized During Treatment: Gait belt;Rolling walker (2 wheels) Nurse Communication: Mobility status (+2 pivots for safety)  Activity Tolerance: Patient tolerated treatment well Patient left: in bed;with bed alarm set;with call bell/phone within reach  OT Visit Diagnosis: Unsteadiness on feet (R26.81);Other abnormalities of gait and mobility (R26.89);Pain;History of falling (Z91.81) Pain - Right/Left: Right Pain - part of body: Hip                Time: 0865-7846 OT Time Calculation (min): 48 min Charges:  OT General Charges $OT Visit: 1 Visit OT Evaluation $OT Eval Moderate Complexity: 1 Mod OT Treatments $Self Care/Home Management : 8-22 mins $Therapeutic Activity: 8-22 mins  06/24/2023  AB, OTR/L  Acute Rehabilitation Services  Office: [phone]   Tristan Schroeder 06/24/2023, 4:13 PM

## 2023-06-24 NOTE — Progress Notes (Signed)
    2 Days Post-Op Procedure(s) (LRB): POSTERIOR APPROACH HEMI HIP ARTHROPLASTY (Right)  Subjective:  Patient reports pain as mild at rest, moderate with activity.  Slept okay with some interruptions.  Motivated for PT today, was unable to ambulate yesterday -- hopeful for today.  Had an episode of incontinence this morning, otherwise normal BMs.  Anticipate SNF placement.  Denies chest pain, ShOB, N/V.  No other complaints.  Objective:   VITALS:   Vitals:   06/23/23 2004 06/23/23 2320 06/24/23 0336 06/24/23 0844  BP: (!) 127/55 122/66 (!) 142/69 126/88  Pulse: 80 75 75 78  Resp: 18 17 16 16   Temp: 98.2 F (36.8 C) 98.2 F (36.8 C) 98.2 F (36.8 C) 98.5 F (36.9 C)  TempSrc: Oral Oral Oral Oral  SpO2:    97%  Weight:      Height:        AAOX4, sitting comfortably in chair Neurovascular intact Sensation intact distally Intact pulses distally Dorsiflexion/Plantar flexion intact Incision: dressing C/D/I Compartment soft Wiggles toes appropriately   Lab Results  Component Value Date   WBC 13.0 (H) 06/24/2023   HGB 10.7 (L) 06/24/2023   HCT 34.1 (L) 06/24/2023   MCV 98.6 06/24/2023   PLT 223 06/24/2023   BMET    Component Value Date/Time   NA 134 (L) 06/24/2023 0235   K 3.7 06/24/2023 0235   CL 100 06/24/2023 0235   CO2 27 06/24/2023 0235   GLUCOSE 106 (H) 06/24/2023 0235   BUN 27 (H) 06/24/2023 0235   CREATININE 0.57 06/24/2023 0235   CALCIUM 8.1 (L) 06/24/2023 0235   GFRNONAA >60 06/24/2023 0235     Xray: stable post-operative imaging  Assessment/Plan: 2 Days Post-Op   Principal Problem:   Hip fracture (HCC) Active Problems:   Sinus tachycardia   Elevated blood pressure reading  Hgb relatively stable  Weightbearing: WBAT RLE Insicional and dressing care: Reinforce dressings as needed VTE prophylaxis: Lovenox inpatient/outpatient Pain control: continue current regimen, Tramadol printed Follow - up plan: w/ Dr. Dion Saucier in office in 2  weeks Dispo: pending PT/OT evals and medicine recommendation, anticipate SNF   Cecil Cobbs 06/24/2023, 10:06 AM   Contact information:   Weekdays 7am-5pm epic message Dr. Dion Saucier, Janine Ores PA-C, or call office for patient follow up: [phone] After hours and holidays please check [email or web address] for group call information for [facility]

## 2023-06-24 NOTE — TOC CAGE-AID Note (Signed)
Transition of Care [facility]) - CAGE-AID Screening   Patient Details  Name: Frances Ortiz MRN: [id number] Date of Birth: [birth year]  [facility]) CM/SW Contact:    Leota Sauers, RN Phone Number: 06/24/2023, 4:04 AM   Clinical Narrative:  Patient denies use of alcohol and illicit drugs. Resources not given at this time.   CAGE-AID Screening:    Have You Ever Felt You Ought to Cut Down on Your Drinking or Drug Use?: No Have People Annoyed You By Critizing Your Drinking Or Drug Use?: No Have You Felt Bad Or Guilty About Your Drinking Or Drug Use?: No Have You Ever Had a Drink or Used Drugs First Thing In The Morning to Steady Your Nerves or to Get Rid of a Hangover?: No CAGE-AID Score: 0  Substance Abuse Education Offered: No

## 2023-06-24 NOTE — Plan of Care (Signed)

## 2023-06-25 ENCOUNTER — Encounter (HOSPITAL_COMMUNITY): Payer: Self-pay | Admitting: Orthopedic Surgery

## 2023-06-25 DIAGNOSIS — Z9189 Other specified personal risk factors, not elsewhere classified: Secondary | ICD-10-CM | POA: Diagnosis not present

## 2023-06-25 DIAGNOSIS — Z9049 Acquired absence of other specified parts of digestive tract: Secondary | ICD-10-CM | POA: Diagnosis not present

## 2023-06-25 DIAGNOSIS — K449 Diaphragmatic hernia without obstruction or gangrene: Secondary | ICD-10-CM | POA: Diagnosis not present

## 2023-06-25 DIAGNOSIS — F32A Depression, unspecified: Secondary | ICD-10-CM | POA: Diagnosis not present

## 2023-06-25 DIAGNOSIS — R2689 Other abnormalities of gait and mobility: Secondary | ICD-10-CM | POA: Diagnosis not present

## 2023-06-25 DIAGNOSIS — R262 Difficulty in walking, not elsewhere classified: Secondary | ICD-10-CM | POA: Diagnosis not present

## 2023-06-25 DIAGNOSIS — R000 Tachycardia, unspecified: Secondary | ICD-10-CM | POA: Diagnosis not present

## 2023-06-25 DIAGNOSIS — K317 Polyp of stomach and duodenum: Secondary | ICD-10-CM | POA: Diagnosis not present

## 2023-06-25 DIAGNOSIS — Z96641 Presence of right artificial hip joint: Secondary | ICD-10-CM | POA: Diagnosis not present

## 2023-06-25 DIAGNOSIS — Z471 Aftercare following joint replacement surgery: Secondary | ICD-10-CM | POA: Diagnosis not present

## 2023-06-25 DIAGNOSIS — R531 Weakness: Secondary | ICD-10-CM | POA: Diagnosis not present

## 2023-06-25 DIAGNOSIS — R030 Elevated blood-pressure reading, without diagnosis of hypertension: Secondary | ICD-10-CM | POA: Diagnosis not present

## 2023-06-25 DIAGNOSIS — S72011D Unspecified intracapsular fracture of right femur, subsequent encounter for closed fracture with routine healing: Secondary | ICD-10-CM | POA: Diagnosis not present

## 2023-06-25 DIAGNOSIS — R918 Other nonspecific abnormal finding of lung field: Secondary | ICD-10-CM | POA: Diagnosis not present

## 2023-06-25 DIAGNOSIS — K222 Esophageal obstruction: Secondary | ICD-10-CM | POA: Diagnosis not present

## 2023-06-25 DIAGNOSIS — Z85820 Personal history of malignant melanoma of skin: Secondary | ICD-10-CM | POA: Diagnosis not present

## 2023-06-25 DIAGNOSIS — R079 Chest pain, unspecified: Secondary | ICD-10-CM | POA: Diagnosis not present

## 2023-06-25 DIAGNOSIS — M25551 Pain in right hip: Secondary | ICD-10-CM | POA: Diagnosis not present

## 2023-06-25 DIAGNOSIS — R112 Nausea with vomiting, unspecified: Secondary | ICD-10-CM | POA: Diagnosis not present

## 2023-06-25 DIAGNOSIS — K219 Gastro-esophageal reflux disease without esophagitis: Secondary | ICD-10-CM | POA: Diagnosis not present

## 2023-06-25 DIAGNOSIS — Z23 Encounter for immunization: Secondary | ICD-10-CM | POA: Diagnosis not present

## 2023-06-25 DIAGNOSIS — R197 Diarrhea, unspecified: Secondary | ICD-10-CM | POA: Diagnosis not present

## 2023-06-25 DIAGNOSIS — K8020 Calculus of gallbladder without cholecystitis without obstruction: Secondary | ICD-10-CM | POA: Diagnosis not present

## 2023-06-25 DIAGNOSIS — Z85828 Personal history of other malignant neoplasm of skin: Secondary | ICD-10-CM | POA: Diagnosis not present

## 2023-06-25 DIAGNOSIS — K3189 Other diseases of stomach and duodenum: Secondary | ICD-10-CM | POA: Diagnosis not present

## 2023-06-25 DIAGNOSIS — Z7401 Bed confinement status: Secondary | ICD-10-CM | POA: Diagnosis not present

## 2023-06-25 DIAGNOSIS — M6281 Muscle weakness (generalized): Secondary | ICD-10-CM | POA: Diagnosis not present

## 2023-06-25 DIAGNOSIS — F419 Anxiety disorder, unspecified: Secondary | ICD-10-CM | POA: Diagnosis not present

## 2023-06-25 DIAGNOSIS — I959 Hypotension, unspecified: Secondary | ICD-10-CM | POA: Diagnosis not present

## 2023-06-25 DIAGNOSIS — I700 Atherosclerosis of aorta: Secondary | ICD-10-CM | POA: Diagnosis not present

## 2023-06-25 DIAGNOSIS — K922 Gastrointestinal hemorrhage, unspecified: Secondary | ICD-10-CM | POA: Diagnosis not present

## 2023-06-25 DIAGNOSIS — R1111 Vomiting without nausea: Secondary | ICD-10-CM | POA: Diagnosis not present

## 2023-06-25 DIAGNOSIS — K838 Other specified diseases of biliary tract: Secondary | ICD-10-CM | POA: Diagnosis not present

## 2023-06-25 DIAGNOSIS — Z66 Do not resuscitate: Secondary | ICD-10-CM | POA: Diagnosis not present

## 2023-06-25 DIAGNOSIS — Z85038 Personal history of other malignant neoplasm of large intestine: Secondary | ICD-10-CM | POA: Diagnosis not present

## 2023-06-25 DIAGNOSIS — W19XXXA Unspecified fall, initial encounter: Secondary | ICD-10-CM | POA: Diagnosis present

## 2023-06-25 DIAGNOSIS — M48061 Spinal stenosis, lumbar region without neurogenic claudication: Secondary | ICD-10-CM | POA: Diagnosis not present

## 2023-06-25 DIAGNOSIS — Z961 Presence of intraocular lens: Secondary | ICD-10-CM | POA: Diagnosis not present

## 2023-06-25 DIAGNOSIS — S72001A Fracture of unspecified part of neck of right femur, initial encounter for closed fracture: Secondary | ICD-10-CM | POA: Diagnosis not present

## 2023-06-25 DIAGNOSIS — D649 Anemia, unspecified: Secondary | ICD-10-CM | POA: Diagnosis not present

## 2023-06-25 DIAGNOSIS — I10 Essential (primary) hypertension: Secondary | ICD-10-CM | POA: Diagnosis not present

## 2023-06-25 DIAGNOSIS — D62 Acute posthemorrhagic anemia: Secondary | ICD-10-CM | POA: Diagnosis not present

## 2023-06-25 DIAGNOSIS — D6832 Hemorrhagic disorder due to extrinsic circulating anticoagulants: Secondary | ICD-10-CM | POA: Diagnosis not present

## 2023-06-25 DIAGNOSIS — S72001D Fracture of unspecified part of neck of right femur, subsequent encounter for closed fracture with routine healing: Secondary | ICD-10-CM | POA: Diagnosis not present

## 2023-06-25 DIAGNOSIS — E785 Hyperlipidemia, unspecified: Secondary | ICD-10-CM | POA: Diagnosis not present

## 2023-06-25 DIAGNOSIS — R911 Solitary pulmonary nodule: Secondary | ICD-10-CM | POA: Diagnosis not present

## 2023-06-25 DIAGNOSIS — K254 Chronic or unspecified gastric ulcer with hemorrhage: Secondary | ICD-10-CM | POA: Diagnosis not present

## 2023-06-25 DIAGNOSIS — Z1152 Encounter for screening for COVID-19: Secondary | ICD-10-CM | POA: Diagnosis not present

## 2023-06-25 MED ORDER — FERROUS SULFATE 325 (65 FE) MG PO TABS: 325.0000 mg | ORAL_TABLET | Freq: Every day | ORAL | Status: DC

## 2023-06-25 MED ORDER — DOCUSATE SODIUM 100 MG PO CAPS: 100.0000 mg | ORAL_CAPSULE | Freq: Two times a day (BID) | ORAL | Status: DC

## 2023-06-25 MED ORDER — POLYETHYLENE GLYCOL 3350 17 G PO PACK: 17.0000 g | PACK | Freq: Every day | ORAL | Status: DC | PRN

## 2023-06-25 MED ORDER — BISACODYL 10 MG RE SUPP: 10.0000 mg | Freq: Every day | RECTAL | Status: DC | PRN

## 2023-06-25 MED ORDER — AMLODIPINE BESYLATE 5 MG PO TABS: 5.0000 mg | ORAL_TABLET | Freq: Every day | ORAL | Status: DC

## 2023-06-25 MED ORDER — METOPROLOL TARTRATE 50 MG PO TABS: 50.0000 mg | ORAL_TABLET | Freq: Two times a day (BID) | ORAL | Status: DC

## 2023-06-25 NOTE — TOC Transition Note (Addendum)
Transition of Care [facility]) - CM/SW Discharge Note   Patient Details  Name: Frances Ortiz MRN: [id number] Date of Birth: [birth year]  Transition of Care [facility]) CM/SW Contact:  Mearl Latin, LCSW Phone Number: 06/25/2023, 11:08 AM   Clinical Narrative:    Patient will DC to: [facility] Anticipated DC date: 06/25/23 Family notified: Daughter, Dietitian by: Sharin Mons   Per MD patient ready for DC to [facility] SNF. RN to call report prior to discharge [phone] room 603a). RN, patient, patient's family, and facility notified of DC. Discharge Summary and FL2 sent to facility. DC packet on chart including signed DNR and scripts. Ambulance transport requested for patient.   CSW will sign off for now as social work intervention is no longer needed. Please consult [place] again if new needs arise.     Final next level of care: Skilled Nursing Facility Barriers to Discharge: Barriers Resolved   Patient Goals and CMS Choice CMS Medicare.gov Compare Post Acute Care list provided to:: Patient Choice offered to / list presented to : Patient, Adult Children  Discharge Placement     Existing PASRR number confirmed : 06/25/23          Patient chooses bed at: [facility] Patient to be transferred to facility by: [facility] Name of family member notified: Daughter Patient and family notified of of transfer: 06/25/23  Discharge Plan and Services Additional resources added to the After Visit Summary for   In-house Referral: Clinical Social Work   Post Acute Care Choice: Skilled Nursing Facility                               Social Determinants of Health (SDOH) Interventions SDOH Screenings   Food Insecurity: No Food Insecurity (06/21/2023)  Housing: Patient Declined (06/21/2023)  Transportation Needs: No Transportation Needs (06/21/2023)  Utilities: Not At Risk (06/21/2023)  Tobacco Use: Low Risk  (06/22/2023)     Readmission Risk Interventions     No data to display

## 2023-06-25 NOTE — Plan of Care (Signed)
  Problem: Education: Goal: Knowledge of General Education information will improve Description: Including pain rating scale, medication(s)/side effects and non-pharmacologic comfort measures Outcome: Completed/Met   Problem: Health Behavior/Discharge Planning: Goal: Ability to manage health-related needs will improve Outcome: Completed/Met   Problem: Clinical Measurements: Goal: Ability to maintain clinical measurements within normal limits will improve Outcome: Completed/Met Goal: Will remain free from infection Outcome: Completed/Met Goal: Diagnostic test results will improve Outcome: Completed/Met Goal: Respiratory complications will improve Outcome: Completed/Met Goal: Cardiovascular complication will be avoided Outcome: Completed/Met   Problem: Activity: Goal: Risk for activity intolerance will decrease Outcome: Completed/Met   Problem: Nutrition: Goal: Adequate nutrition will be maintained Outcome: Completed/Met   Problem: Coping: Goal: Level of anxiety will decrease Outcome: Completed/Met   Problem: Elimination: Goal: Will not experience complications related to bowel motility Outcome: Completed/Met Goal: Will not experience complications related to urinary retention Outcome: Completed/Met   Problem: Pain Managment: Goal: General experience of comfort will improve Outcome: Completed/Met   Problem: Safety: Goal: Ability to remain free from injury will improve Outcome: Completed/Met   Problem: Skin Integrity: Goal: Risk for impaired skin integrity will decrease Outcome: Completed/Met   Problem: Education: Goal: Verbalization of understanding the information provided (i.e., activity precautions, restrictions, etc) will improve Outcome: Completed/Met Goal: Individualized Educational Video(s) Outcome: Completed/Met   Problem: Activity: Goal: Ability to ambulate and perform ADLs will improve Outcome: Completed/Met   Problem: Clinical Measurements: Goal:  Postoperative complications will be avoided or minimized Outcome: Completed/Met   Problem: Self-Concept: Goal: Ability to maintain and perform role responsibilities to the fullest extent possible will improve Outcome: Completed/Met   Problem: Pain Management: Goal: Pain level will decrease Outcome: Completed/Met

## 2023-06-25 NOTE — Care Management Important Message (Signed)
Important Message  Patient Details  Name: Frances Ortiz MRN: [id number] Date of Birth: [birth year]   Medicare Important Message Given:  Yes     Sherilyn Banker 06/25/2023, 12:37 PM

## 2023-06-25 NOTE — Discharge Summary (Signed)
Frances Ortiz CBJ:[id number] DOB: [birth year] DOA: 06/21/2023  PCP: Irven Coe, MD  Admit date: 06/21/2023  Discharge date: 06/25/2023  Admitted From: Home   Disposition:  SNF   Recommendations for Outpatient Follow-up:   Follow up with PCP in 1-2 weeks  PCP Please obtain BMP/CBC, 2 view CXR in 1week,  (see Discharge instructions)   PCP Please follow up on the following pending results: Review CT head findings, outpatient ENT follow-up in 1 to 2 weeks by PCP postdischarge.   Home Health: None   Equipment/Devices: None  Consultations: Orthopedics Discharge Condition: Stable    CODE STATUS: Full    Diet Recommendation: Heart Healthy     Chief Complaint  Patient presents with   Fall     Brief history of present illness from the day of admission and additional interim summary     89 y.o. female with medical history significant of hyperlipidemia, GERD, anxiety/depression, cecal cancer status post right colectomy presented to the ED complaining of right hip pain after a mechanical fall.  Workup in the ER showed acute right femoral neck fracture and she was admitted to the hospital.                                                                  Hospital Course   Acute right femoral neck fracture - Secondary to a mechanical fall. Seen by Dr Lorretta Harp pots ORIF 06/22/23, weight bearing as tolerated, Lovenox for DVT prophylaxis.  Advance activity PT OT, will need placement to SNF on 06/25/2023 if bed available.   Hypertension-patient on beta-blocker and low-dose Norvasc, stable, monitor   ?Chronic versus fungal sinusitis - Did on CT scan no symptoms outpatient follow-up with PCP and ENT as needed.   Anxiety/depression - Continue Zoloft.    Discharge diagnosis     Principal Problem:   Hip fracture (HCC) Active  Problems:   Sinus tachycardia   Elevated blood pressure reading    Discharge instructions    Discharge Instructions     Discharge instructions   Complete by: As directed    Follow with Primary MD Irven Coe, MD in 7 days   Get CBC, CMP, 2 view Chest X ray -  checked next visit with your primary MD or SNF MD   Activity: Weightbearing as tolerated with Full fall precautions use walker/cane & assistance as needed  Disposition SNF  Diet: Heart Healthy   Special Instructions: If you have smoked or chewed Tobacco  in the last 2 yrs please stop smoking, stop any regular Alcohol  and or any Recreational drug use.  On your next visit with your primary care physician please Get Medicines reviewed and adjusted.  Please request your Prim.MD to go over all Hospital Tests and Procedure/Radiological results at the follow up, please get  all Hospital records sent to your Prim MD by signing hospital release before you go home.  If you experience worsening of your admission symptoms, develop shortness of breath, life threatening emergency, suicidal or homicidal thoughts you must seek medical attention immediately by calling 911 or calling your MD immediately  if symptoms less severe.  You Must read complete instructions/literature along with all the possible adverse reactions/side effects for all the Medicines you take and that have been prescribed to you. Take any new Medicines after you have completely understood and accpet all the possible adverse reactions/side effects.   Do not drive when taking Pain medications.  Do not take more than prescribed Pain, Sleep and Anxiety Medications  Wear Seat belts while driving.       Discharge Medications   Allergies as of 06/25/2023       Reactions   Aristocort [triamcinolone] Other (See Comments)   Emotional instability   Cortisone Other (See Comments)   Emotional instability   Erythromycin Other (See Comments)   Stomach pain   Other Other (See  Comments)   Epinephrine in novocain makes heart race.   Hydrocodone-acetaminophen Rash        Medication List     STOP taking these medications    methocarbamol 500 MG tablet Commonly known as: ROBAXIN       TAKE these medications    amLODipine 5 MG tablet Commonly known as: NORVASC Take 1 tablet (5 mg total) by mouth daily.   bisacodyl 10 MG suppository Commonly known as: DULCOLAX Place 1 suppository (10 mg total) rectally daily as needed for moderate constipation.   docusate sodium 100 MG capsule Commonly known as: COLACE Take 1 capsule (100 mg total) by mouth 2 (two) times daily.   enoxaparin 40 MG/0.4ML injection Commonly known as: LOVENOX Inject 0.4 mLs (40 mg total) into the skin daily for 30 doses. For 30 days post op for DVT prophylaxis   ferrous sulfate 325 (65 FE) MG tablet Take 1 tablet (325 mg total) by mouth daily with breakfast.   Metamucil Fiber Chew Chew 3 tablets by mouth daily.   metoprolol tartrate 50 MG tablet Commonly known as: LOPRESSOR Take 1 tablet (50 mg total) by mouth 2 (two) times daily.   naproxen sodium 220 MG tablet Commonly known as: ALEVE Take 220 mg by mouth in the morning, at noon, and at bedtime.   polyethylene glycol 17 g packet Commonly known as: MIRALAX / GLYCOLAX Take 17 g by mouth daily as needed for mild constipation.   sertraline 50 MG tablet Commonly known as: ZOLOFT Take 50 mg by mouth daily.   traMADol 50 MG tablet Commonly known as: ULTRAM Take 1 tablet (50 mg total) by mouth every 8 (eight) hours as needed for moderate pain. What changed: when to take this         Contact information for follow-up providers     Teryl Lucy, MD. Schedule an appointment as soon as possible for a visit in 2 week(s).   Specialty: Orthopedic Surgery Contact information: [address] [place] [address] [phone]         Irven Coe, MD. Schedule an appointment as soon as possible for a  visit in 1 week(s).   Specialty: Family Medicine Contact information: [address] [place] [address] [phone]              Contact information for after-discharge care     Destination  [facility] .   Service: Skilled Nursing Contact information: [address] [facility] Test Update Address [place] [address] [phone]                     Major procedures and Radiology Reports - PLEASE review detailed and final reports thoroughly  -       DG HIP UNILAT W OR W/O PELVIS 2-3 VIEWS RIGHT  Result Date: 06/22/2023 CLINICAL DATA:  Status post right hip cemented hemiarthroplasty for right hip fracture. EXAM: DG HIP (WITH OR WITHOUT PELVIS) 2-3V RIGHT COMPARISON:  Right hip radiographs 06/21/2023, CT right hip 06/21/2023 FINDINGS: Interval right hip hemiarthroplasty. No perihardware lucency is seen to be to indicate hardware failure or loosening. Expected postoperative right hip subcutaneous air. Mild bilateral sacroiliac subchondral sclerosis. Mild left superomedial femoroacetabular joint space narrowing. IMPRESSION: Interval right hip hemiarthroplasty without evidence of hardware failure or loosening. Electronically Signed   By: Neita Garnet M.D.   On: 06/22/2023 11:43   CT HIP RIGHT WO CONTRAST  Result Date: 06/21/2023 CLINICAL DATA:  Right femoral neck fracture.  Fell while walking. EXAM: CT OF THE RIGHT HIP WITHOUT CONTRAST TECHNIQUE: Multidetector CT imaging of the right hip was performed according to the standard protocol. Multiplanar CT image reconstructions were also generated. RADIATION DOSE REDUCTION: This exam was performed according to the departmental dose-optimization program which includes automated exposure control, adjustment of the mA and/or kV according to patient size and/or use of iterative reconstruction technique. COMPARISON:  Right hip x-rays from same day. FINDINGS: Bones/Joint/Cartilage Acute  impacted right subcapital femoral neck fracture. No dislocation. Mild right hip joint space narrowing with small marginal osteophytes. No significant joint effusion. Ligaments Ligaments are suboptimally evaluated by CT. Muscles and Tendons Grossly intact.  Gluteus minimus and medius muscle atrophy. Soft tissue Soft tissue swelling along the posterolateral hip with small 2.0 x 1.6 x 1.8 cm hematoma. No soft tissue mass. Small fat containing right inguinal hernia. IMPRESSION: 1. Acute impacted right subcapital femoral neck fracture. 2. Soft tissue swelling along the posterolateral hip with small 2.0 cm hematoma. Electronically Signed   By: Obie Dredge M.D.   On: 06/21/2023 07:52   DG Knee Right Port  Result Date: 06/21/2023 CLINICAL DATA:  Right hip fracture after ground level fall. EXAM: PORTABLE RIGHT KNEE - 1-2 VIEW COMPARISON:  None Available. FINDINGS: Osteopenia. There is no joint effusion. There is no sign of acute fracture or dislocation. Soft tissues are unremarkable. IMPRESSION: 1. No acute osseous findings. 2. Osteopenia. Electronically Signed   By: Signa Kell M.D.   On: 06/21/2023 07:52   DG HIP UNILAT WITH PELVIS 2-3 VIEWS RIGHT  Result Date: 06/21/2023 CLINICAL DATA:  [id number] Fall [id number] EXAM: DG HIP (WITH OR WITHOUT PELVIS) 2-3V RIGHT COMPARISON:  CT abdomen pelvis 10/02/2017. FINDINGS: Shortening of the right femoral neck with associated poorly visualized lucency along the neck/head consistent with an acute fracture. No right hip dislocation peer frontal view of the left hip demonstrates no acute fracture or dislocation. Acute displaced fracture or diastasis of the bones of the pelvis. There is no evidence of severe arthropathy or other focal bone abnormality. Kyphoplasty noted of the lumbar spine. IMPRESSION: Acute right femoral neck/neck fracture poorly visualized. Electronically Signed   By: Tish Frederickson M.D.   On: 06/21/2023 03:16   DG Chest Port 1 View  Result Date:  06/21/2023 CLINICAL DATA:  fall EXAM: PORTABLE CHEST 1 VIEW COMPARISON:  CT abdomen pelvis 10/02/2017 FINDINGS: The  heart and mediastinal contours are within normal limits. Aortic calcification. Hyperinflation of the lungs. No focal consolidation. Coarsened interstitial markings with no overt pulmonary edema. No pleural effusion. No pneumothorax. No acute osseous abnormality. IMPRESSION: 1. No active disease. 2. Aortic Atherosclerosis (ICD10-I70.0) and Emphysema (ICD10-J43.9). Electronically Signed   By: Tish Frederickson M.D.   On: 06/21/2023 03:14   CT HEAD WO CONTRAST ( )  Result Date: 06/21/2023 CLINICAL DATA:  Polytrauma, blunt EXAM: CT HEAD WITHOUT CONTRAST CT CERVICAL SPINE WITHOUT CONTRAST TECHNIQUE: Multidetector CT imaging of the head and cervical spine was performed following the standard protocol without intravenous contrast. Multiplanar CT image reconstructions of the cervical spine were also generated. RADIATION DOSE REDUCTION: This exam was performed according to the departmental dose-optimization program which includes automated exposure control, adjustment of the mA and/or kV according to patient size and/or use of iterative reconstruction technique. COMPARISON:  CT head 12/09/2022 FINDINGS: CT HEAD FINDINGS Brain: Cerebral ventricle sizes are concordant with the degree of cerebral volume loss. Patchy and confluent areas of decreased attenuation are noted throughout the deep and periventricular white matter of the cerebral hemispheres bilaterally, compatible with chronic microvascular ischemic disease. No evidence of large-territorial acute infarction. No parenchymal hemorrhage. No mass lesion. No extra-axial collection. No mass effect or midline shift. No hydrocephalus. Basilar cisterns are patent. Vascular: No hyperdense vessel. Skull: No acute fracture or focal lesion. Sinuses/Orbits: Right sphenoid sinus mucosal thickening with calcifications. Paranasal sinuses and mastoid air cells are  clear. Bilateral lens replacement. Otherwise the orbits are unremarkable. Other: None. CT CERVICAL SPINE FINDINGS Alignment: Grade 1 anterolisthesis of C3 on C4 and C4 on C5. Skull base and vertebrae: Multilevel moderate severe degenerative changes spine most prominent at the C6-C7 level. Associated multilevel severe osseous neural foraminal stenosis. No severe osseous central canal stenosis. No acute fracture. No aggressive appearing focal osseous lesion or focal pathologic process. Soft tissues and spinal canal: No prevertebral fluid or swelling. No visible canal hematoma. Upper chest: Unremarkable. Other: None. IMPRESSION: 1. No acute intracranial abnormality. 2. No acute displaced fracture or traumatic listhesis of the cervical spine. 3. Right sphenoid sinus mucosal thickening with calcifications. Findings suggestive of chronic versus fungal sinusitis. Electronically Signed   By: Tish Frederickson M.D.   On: 06/21/2023 03:12   CT Cervical Spine Wo Contrast  Result Date: 06/21/2023 CLINICAL DATA:  Polytrauma, blunt EXAM: CT HEAD WITHOUT CONTRAST CT CERVICAL SPINE WITHOUT CONTRAST TECHNIQUE: Multidetector CT imaging of the head and cervical spine was performed following the standard protocol without intravenous contrast. Multiplanar CT image reconstructions of the cervical spine were also generated. RADIATION DOSE REDUCTION: This exam was performed according to the departmental dose-optimization program which includes automated exposure control, adjustment of the mA and/or kV according to patient size and/or use of iterative reconstruction technique. COMPARISON:  CT head 12/09/2022 FINDINGS: CT HEAD FINDINGS Brain: Cerebral ventricle sizes are concordant with the degree of cerebral volume loss. Patchy and confluent areas of decreased attenuation are noted throughout the deep and periventricular white matter of the cerebral hemispheres bilaterally, compatible with chronic microvascular ischemic disease. No  evidence of large-territorial acute infarction. No parenchymal hemorrhage. No mass lesion. No extra-axial collection. No mass effect or midline shift. No hydrocephalus. Basilar cisterns are patent. Vascular: No hyperdense vessel. Skull: No acute fracture or focal lesion. Sinuses/Orbits: Right sphenoid sinus mucosal thickening with calcifications. Paranasal sinuses and mastoid air cells are clear. Bilateral lens replacement. Otherwise the orbits are unremarkable. Other: None. CT CERVICAL SPINE FINDINGS Alignment: Grade 1 anterolisthesis  of C3 on C4 and C4 on C5. Skull base and vertebrae: Multilevel moderate severe degenerative changes spine most prominent at the C6-C7 level. Associated multilevel severe osseous neural foraminal stenosis. No severe osseous central canal stenosis. No acute fracture. No aggressive appearing focal osseous lesion or focal pathologic process. Soft tissues and spinal canal: No prevertebral fluid or swelling. No visible canal hematoma. Upper chest: Unremarkable. Other: None. IMPRESSION: 1. No acute intracranial abnormality. 2. No acute displaced fracture or traumatic listhesis of the cervical spine. 3. Right sphenoid sinus mucosal thickening with calcifications. Findings suggestive of chronic versus fungal sinusitis. Electronically Signed   By: Tish Frederickson M.D.   On: 06/21/2023 03:12    Micro Results   Recent Results (from the past 240 hour(s))  SARS Coronavirus 2 by RT PCR (hospital order, performed in [facility] hospital lab) *cepheid single result test* Anterior Nasal Swab     Status: None   Collection Time: 06/21/23  3:55 AM   Specimen: Anterior Nasal Swab  Result Value Ref Range Status   SARS Coronavirus 2 by RT PCR NEGATIVE NEGATIVE Final    Comment: Performed at [facility], [address], [place], [place] [address]  MRSA Next Gen by PCR, Nasal     Status: None   Collection Time: 06/21/23 11:00 PM   Specimen: Nasal Mucosa; Nasal Swab  Result Value Ref Range  Status   MRSA by PCR Next Gen NOT DETECTED NOT DETECTED Final    Comment: (NOTE) The GeneXpert MRSA Assay (FDA approved for NASAL specimens only), is one component of a comprehensive MRSA colonization surveillance program. It is not intended to diagnose MRSA infection nor to guide or monitor treatment for MRSA infections. Test performance is not FDA approved in patients less than 87 years old. Performed at [facility], [address] [place], [place] [address]     Today   Subjective    Frances Ortiz today has no headache,no chest abdominal pain,no new weakness tingling or numbness, feels much better wants to go home today.    Objective   Blood pressure 123/75, pulse 70, temperature 98 F (36.7 C), temperature source Oral, resp. rate 17, height 4' 11.02" (1.499 m), weight 61 kg, SpO2 99%.   Intake/Output Summary (Last 24 hours) at 06/25/2023 0737 Last data filed at 06/24/2023 1001 Gross per 24 hour  Intake 120 ml  Output 800 ml  Net -680 ml    Exam  Awake Alert, No new F.N deficits,    [place].AT,PERRAL Supple Neck,   Symmetrical Chest wall movement, Good air movement bilaterally, CTAB RRR,No Gallops,   +ve B.Sounds, Abd Soft, Non tender,  Right hip postop site appears stable under bandage, no surrounding hematoma   Data Review   Recent Labs  Lab 06/21/23 0257 06/21/23 0634 06/22/23 1155 06/23/23 0625 06/24/23 0235  WBC 19.6* 23.0* 19.6* 15.7* 13.0*  HGB 13.6 13.2 12.6 11.0* 10.7*  HCT 42.0 39.0 38.7 34.2* 34.1*  PLT 272 245 217 215 223  MCV 96.3 94.4 99.2 97.7 98.6  MCH 31.2 32.0 32.3 31.4 30.9  MCHC 32.4 33.8 32.6 32.2 31.4  RDW 13.1 13.1 13.2 13.5 13.5  LYMPHSABS  --   --  0.6* 1.6 1.4  MONOABS  --   --  0.5 1.2* 0.9  EOSABS  --   --  0.0 0.2 1.1*  BASOSABS  --   --  0.0 0.0 0.1    Recent Labs  Lab 06/21/23 0257 06/22/23 1155 06/22/23 1209  06/23/23 0226 06/23/23 0625 06/24/23 0235  NA 139 139  --  138  --  134*  K 3.7 4.3  --  4.7  --  3.7   CL 104 101  --  104  --  100  CO2 24 23  --  23  --  27  ANIONGAP 11 15  --  11  --  7  GLUCOSE 109* 134*  --  121*  --  106*  BUN 22 22  --  33*  --  27*  CREATININE 0.62 0.61  --  0.65  --  0.57  PROCALCITON  --  <0.10  --   --   --   --   BNP  --   --  222.5*  --  190.5* 167.9*  MG  --   --   --  2.0  --  1.9  CALCIUM 9.0 8.8*  --  8.8*  --  8.1*    Total Time in preparing paper work, data evaluation and todays exam - 35 minutes  Signature  -    Susa Raring M.D on 06/25/2023 at 7:37 AM   -  To page go to [email or web address]

## 2023-06-25 NOTE — TOC Progression Note (Signed)
Transition of Care [facility]) - Progression Note    Patient Details  Name: Frances Ortiz MRN: [id number] Date of Birth: 1931-05-09  Transition of Care [facility]) CM/SW Contact  Mearl Latin, LCSW Phone Number: 06/25/2023, 8:53 AM  Clinical Narrative:    CSW submitted insurance approval for [facility], Ref# [id number], effective 06/25/2023-06/27/2023. They are able to accept patient today. CSW updated patient's daughter, Frances Ortiz, and she requested [facility] for transport.    Expected Discharge Plan: Skilled Nursing Facility Barriers to Discharge: Continued Medical Work up  Expected Discharge Plan and Services     Post Acute Care Choice: Skilled Nursing Facility Living arrangements for the past 2 months: Single Family Home Expected Discharge Date: 06/25/23                                     Social Determinants of Health (SDOH) Interventions SDOH Screenings   Food Insecurity: No Food Insecurity (06/21/2023)  Housing: Patient Declined (06/21/2023)  Transportation Needs: No Transportation Needs (06/21/2023)  Utilities: Not At Risk (06/21/2023)  Tobacco Use: Low Risk  (06/22/2023)    Readmission Risk Interventions     No data to display

## 2023-06-25 NOTE — Plan of Care (Signed)

## 2023-06-27 DIAGNOSIS — R531 Weakness: Secondary | ICD-10-CM | POA: Diagnosis not present

## 2023-06-27 DIAGNOSIS — E785 Hyperlipidemia, unspecified: Secondary | ICD-10-CM | POA: Diagnosis not present

## 2023-06-27 DIAGNOSIS — S72001D Fracture of unspecified part of neck of right femur, subsequent encounter for closed fracture with routine healing: Secondary | ICD-10-CM | POA: Diagnosis not present

## 2023-07-04 DIAGNOSIS — D649 Anemia, unspecified: Secondary | ICD-10-CM | POA: Diagnosis not present

## 2023-07-08 ENCOUNTER — Encounter: Payer: Self-pay | Admitting: Internal Medicine

## 2023-07-10 ENCOUNTER — Encounter: Payer: Self-pay | Admitting: Internal Medicine

## 2023-07-12 ENCOUNTER — Other Ambulatory Visit: Payer: Self-pay

## 2023-07-12 ENCOUNTER — Encounter (HOSPITAL_COMMUNITY): Payer: Self-pay

## 2023-07-12 ENCOUNTER — Inpatient Hospital Stay (HOSPITAL_COMMUNITY)
Admission: EM | Admit: 2023-07-12 | Discharge: 2023-07-17 | DRG: 377 | Disposition: A | Discharge status: Skilled Nursing Facility | Payer: Medicare Other | Source: Skilled Nursing Facility | Attending: Internal Medicine | Admitting: Internal Medicine

## 2023-07-12 ENCOUNTER — Emergency Department (HOSPITAL_COMMUNITY): Payer: Medicare Other

## 2023-07-12 DIAGNOSIS — K922 Gastrointestinal hemorrhage, unspecified: Secondary | ICD-10-CM | POA: Diagnosis not present

## 2023-07-12 DIAGNOSIS — F32A Depression, unspecified: Secondary | ICD-10-CM | POA: Diagnosis not present

## 2023-07-12 DIAGNOSIS — E785 Hyperlipidemia, unspecified: Secondary | ICD-10-CM | POA: Diagnosis not present

## 2023-07-12 DIAGNOSIS — Z79899 Other long term (current) drug therapy: Secondary | ICD-10-CM

## 2023-07-12 DIAGNOSIS — Z471 Aftercare following joint replacement surgery: Secondary | ICD-10-CM | POA: Diagnosis not present

## 2023-07-12 DIAGNOSIS — Z66 Do not resuscitate: Secondary | ICD-10-CM | POA: Diagnosis not present

## 2023-07-12 DIAGNOSIS — S72001A Fracture of unspecified part of neck of right femur, initial encounter for closed fracture: Secondary | ICD-10-CM | POA: Diagnosis present

## 2023-07-12 DIAGNOSIS — Z888 Allergy status to other drugs, medicaments and biological substances status: Secondary | ICD-10-CM

## 2023-07-12 DIAGNOSIS — Z881 Allergy status to other antibiotic agents status: Secondary | ICD-10-CM

## 2023-07-12 DIAGNOSIS — M80052A Age-related osteoporosis with current pathological fracture, left femur, initial encounter for fracture: Secondary | ICD-10-CM | POA: Diagnosis present

## 2023-07-12 DIAGNOSIS — K317 Polyp of stomach and duodenum: Secondary | ICD-10-CM | POA: Diagnosis present

## 2023-07-12 DIAGNOSIS — Z96641 Presence of right artificial hip joint: Secondary | ICD-10-CM | POA: Diagnosis not present

## 2023-07-12 DIAGNOSIS — Z85828 Personal history of other malignant neoplasm of skin: Secondary | ICD-10-CM | POA: Diagnosis not present

## 2023-07-12 DIAGNOSIS — Z23 Encounter for immunization: Secondary | ICD-10-CM | POA: Diagnosis not present

## 2023-07-12 DIAGNOSIS — K449 Diaphragmatic hernia without obstruction or gangrene: Secondary | ICD-10-CM | POA: Diagnosis not present

## 2023-07-12 DIAGNOSIS — K838 Other specified diseases of biliary tract: Secondary | ICD-10-CM | POA: Diagnosis not present

## 2023-07-12 DIAGNOSIS — C180 Malignant neoplasm of cecum: Secondary | ICD-10-CM | POA: Diagnosis present

## 2023-07-12 DIAGNOSIS — I10 Essential (primary) hypertension: Secondary | ICD-10-CM

## 2023-07-12 DIAGNOSIS — I959 Hypotension, unspecified: Secondary | ICD-10-CM | POA: Diagnosis not present

## 2023-07-12 DIAGNOSIS — Z9049 Acquired absence of other specified parts of digestive tract: Secondary | ICD-10-CM | POA: Diagnosis not present

## 2023-07-12 DIAGNOSIS — Z85038 Personal history of other malignant neoplasm of large intestine: Secondary | ICD-10-CM | POA: Diagnosis not present

## 2023-07-12 DIAGNOSIS — R918 Other nonspecific abnormal finding of lung field: Secondary | ICD-10-CM | POA: Diagnosis not present

## 2023-07-12 DIAGNOSIS — Z961 Presence of intraocular lens: Secondary | ICD-10-CM | POA: Diagnosis not present

## 2023-07-12 DIAGNOSIS — K254 Chronic or unspecified gastric ulcer with hemorrhage: Principal | ICD-10-CM | POA: Diagnosis present

## 2023-07-12 DIAGNOSIS — S72009A Fracture of unspecified part of neck of unspecified femur, initial encounter for closed fracture: Secondary | ICD-10-CM | POA: Diagnosis present

## 2023-07-12 DIAGNOSIS — K222 Esophageal obstruction: Secondary | ICD-10-CM | POA: Diagnosis present

## 2023-07-12 DIAGNOSIS — K3189 Other diseases of stomach and duodenum: Secondary | ICD-10-CM | POA: Diagnosis present

## 2023-07-12 DIAGNOSIS — R197 Diarrhea, unspecified: Secondary | ICD-10-CM | POA: Diagnosis not present

## 2023-07-12 DIAGNOSIS — D62 Acute posthemorrhagic anemia: Secondary | ICD-10-CM

## 2023-07-12 DIAGNOSIS — F419 Anxiety disorder, unspecified: Secondary | ICD-10-CM

## 2023-07-12 DIAGNOSIS — D6832 Hemorrhagic disorder due to extrinsic circulating anticoagulants: Secondary | ICD-10-CM | POA: Diagnosis present

## 2023-07-12 DIAGNOSIS — I700 Atherosclerosis of aorta: Secondary | ICD-10-CM | POA: Diagnosis not present

## 2023-07-12 DIAGNOSIS — R112 Nausea with vomiting, unspecified: Secondary | ICD-10-CM | POA: Diagnosis not present

## 2023-07-12 DIAGNOSIS — K219 Gastro-esophageal reflux disease without esophagitis: Secondary | ICD-10-CM | POA: Diagnosis not present

## 2023-07-12 DIAGNOSIS — M6281 Muscle weakness (generalized): Secondary | ICD-10-CM | POA: Diagnosis not present

## 2023-07-12 DIAGNOSIS — R2689 Other abnormalities of gait and mobility: Secondary | ICD-10-CM | POA: Diagnosis not present

## 2023-07-12 DIAGNOSIS — S72011D Unspecified intracapsular fracture of right femur, subsequent encounter for closed fracture with routine healing: Secondary | ICD-10-CM | POA: Diagnosis not present

## 2023-07-12 DIAGNOSIS — T45515A Adverse effect of anticoagulants, initial encounter: Secondary | ICD-10-CM | POA: Diagnosis present

## 2023-07-12 DIAGNOSIS — F418 Other specified anxiety disorders: Secondary | ICD-10-CM | POA: Diagnosis not present

## 2023-07-12 DIAGNOSIS — Z1152 Encounter for screening for COVID-19: Secondary | ICD-10-CM

## 2023-07-12 DIAGNOSIS — R1111 Vomiting without nausea: Secondary | ICD-10-CM | POA: Diagnosis not present

## 2023-07-12 DIAGNOSIS — Z9189 Other specified personal risk factors, not elsewhere classified: Secondary | ICD-10-CM | POA: Diagnosis not present

## 2023-07-12 DIAGNOSIS — W19XXXA Unspecified fall, initial encounter: Secondary | ICD-10-CM | POA: Diagnosis present

## 2023-07-12 DIAGNOSIS — R911 Solitary pulmonary nodule: Secondary | ICD-10-CM

## 2023-07-12 DIAGNOSIS — R000 Tachycardia, unspecified: Secondary | ICD-10-CM | POA: Diagnosis not present

## 2023-07-12 DIAGNOSIS — R58 Hemorrhage, not elsewhere classified: Secondary | ICD-10-CM | POA: Diagnosis not present

## 2023-07-12 DIAGNOSIS — Z885 Allergy status to narcotic agent status: Secondary | ICD-10-CM

## 2023-07-12 DIAGNOSIS — Z85820 Personal history of malignant melanoma of skin: Secondary | ICD-10-CM

## 2023-07-12 DIAGNOSIS — M25551 Pain in right hip: Secondary | ICD-10-CM | POA: Diagnosis not present

## 2023-07-12 DIAGNOSIS — M48061 Spinal stenosis, lumbar region without neurogenic claudication: Secondary | ICD-10-CM | POA: Diagnosis not present

## 2023-07-12 DIAGNOSIS — Z7401 Bed confinement status: Secondary | ICD-10-CM | POA: Diagnosis not present

## 2023-07-12 LAB — CBC WITH DIFFERENTIAL/PLATELET
Abs Immature Granulocytes: 0.05 10*3/uL (ref 0.00–0.07)
Basophils Absolute: 0.1 10*3/uL (ref 0.0–0.1)
Basophils Relative: 1 %
Eosinophils Absolute: 0.2 10*3/uL (ref 0.0–0.5)
Eosinophils Relative: 2 %
HCT: 27.9 % — ABNORMAL LOW (ref 36.0–46.0)
Hemoglobin: 8.7 g/dL — ABNORMAL LOW (ref 12.0–15.0)
Immature Granulocytes: 0 %
Lymphocytes Relative: 12 %
Lymphs Abs: 1.6 10*3/uL (ref 0.7–4.0)
MCH: 30.6 pg (ref 26.0–34.0)
MCHC: 31.2 g/dL (ref 30.0–36.0)
MCV: 98.2 fL (ref 80.0–100.0)
Monocytes Absolute: 0.6 10*3/uL (ref 0.1–1.0)
Monocytes Relative: 5 %
Neutro Abs: 10.4 10*3/uL — ABNORMAL HIGH (ref 1.7–7.7)
Neutrophils Relative %: 80 %
Platelets: 484 10*3/uL — ABNORMAL HIGH (ref 150–400)
RBC: 2.84 MIL/uL — ABNORMAL LOW (ref 3.87–5.11)
RDW: 13.2 % (ref 11.5–15.5)
WBC: 12.9 10*3/uL — ABNORMAL HIGH (ref 4.0–10.5)
nRBC: 0 % (ref 0.0–0.2)

## 2023-07-12 LAB — COMPREHENSIVE METABOLIC PANEL
ALT: 12 U/L (ref 0–44)
AST: 18 U/L (ref 15–41)
Albumin: 3.2 g/dL — ABNORMAL LOW (ref 3.5–5.0)
Alkaline Phosphatase: 75 U/L (ref 38–126)
Anion gap: 11 (ref 5–15)
BUN: 42 mg/dL — ABNORMAL HIGH (ref 8–23)
CO2: 27 mmol/L (ref 22–32)
Calcium: 9 mg/dL (ref 8.9–10.3)
Chloride: 103 mmol/L (ref 98–111)
Creatinine, Ser: 0.6 mg/dL (ref 0.44–1.00)
GFR, Estimated: >60 mL/min (ref >60)
Glucose, Bld: 125 mg/dL — ABNORMAL HIGH (ref 70–99)
Potassium: 3.9 mmol/L (ref 3.5–5.1)
Sodium: 141 mmol/L (ref 135–145)
Total Bilirubin: 0.5 mg/dL (ref 0.3–1.2)
Total Protein: 6.3 g/dL — ABNORMAL LOW (ref 6.5–8.1)

## 2023-07-12 LAB — CBC
HCT: 23.6 % — ABNORMAL LOW (ref 36.0–46.0)
Hemoglobin: 7.3 g/dL — ABNORMAL LOW (ref 12.0–15.0)
MCH: 30.7 pg (ref 26.0–34.0)
MCHC: 30.9 g/dL (ref 30.0–36.0)
MCV: 99.2 fL (ref 80.0–100.0)
Platelets: 407 10*3/uL — ABNORMAL HIGH (ref 150–400)
RBC: 2.38 MIL/uL — ABNORMAL LOW (ref 3.87–5.11)
RDW: 13.3 % (ref 11.5–15.5)
WBC: 12.3 10*3/uL — ABNORMAL HIGH (ref 4.0–10.5)
nRBC: 0 % (ref 0.0–0.2)

## 2023-07-12 LAB — IRON AND TIBC
Iron: 27 ug/dL — ABNORMAL LOW (ref 28–170)
Saturation Ratios: 9 % — ABNORMAL LOW (ref 10.4–31.8)
TIBC: 288 ug/dL (ref 250–450)
UIBC: 261 ug/dL

## 2023-07-12 LAB — TROPONIN I (HIGH SENSITIVITY)
Troponin I (High Sensitivity): 5 ng/L (ref <18)
Troponin I (High Sensitivity): 5 ng/L (ref <18)

## 2023-07-12 LAB — RESP PANEL BY RT-PCR (RSV, FLU A&B, COVID)  RVPGX2
Influenza A by PCR: NEGATIVE
Influenza B by PCR: NEGATIVE
Resp Syncytial Virus by PCR: NEGATIVE
SARS Coronavirus 2 by RT PCR: NEGATIVE

## 2023-07-12 LAB — FERRITIN: Ferritin: 115 ng/mL (ref 11–307)

## 2023-07-12 LAB — POC OCCULT BLOOD, ED: Fecal Occult Bld: POSITIVE — AB

## 2023-07-12 LAB — LIPASE, BLOOD: Lipase: 28 U/L (ref 11–51)

## 2023-07-12 MED ORDER — PANTOPRAZOLE SODIUM 40 MG IV SOLR
40.0000 mg | Freq: Once | INTRAVENOUS | Status: AC
  Administered 2023-07-12: 40 mg via INTRAVENOUS
  Filled 2023-07-12: qty 10

## 2023-07-12 MED ORDER — TRAMADOL HCL 50 MG PO TABS
50.0000 mg | ORAL_TABLET | Freq: Three times a day (TID) | ORAL | Status: DC | PRN
  Filled 2023-07-12: qty 1

## 2023-07-12 MED ORDER — PSYLLIUM 95 % PO PACK
1.0000 | PACK | Freq: Every day | ORAL | Status: DC
  Administered 2023-07-14 – 2023-07-15 (×2): 1 via ORAL
  Filled 2023-07-12 (×5): qty 1

## 2023-07-12 MED ORDER — POLYETHYLENE GLYCOL 3350 17 G PO PACK: 17.0000 g | PACK | Freq: Every day | ORAL | Status: DC | PRN

## 2023-07-12 MED ORDER — ONDANSETRON HCL 4 MG/2ML IJ SOLN
4.0000 mg | Freq: Four times a day (QID) | INTRAMUSCULAR | Status: DC | PRN
  Administered 2023-07-12: 4 mg via INTRAVENOUS
  Filled 2023-07-12: qty 2

## 2023-07-12 MED ORDER — ONDANSETRON HCL 4 MG PO TABS: 4.0000 mg | ORAL_TABLET | Freq: Four times a day (QID) | ORAL | Status: DC | PRN

## 2023-07-12 MED ORDER — FERROUS SULFATE 325 (65 FE) MG PO TABS
325.0000 mg | ORAL_TABLET | Freq: Every day | ORAL | Status: DC
  Administered 2023-07-14 – 2023-07-17 (×4): 325 mg via ORAL
  Filled 2023-07-12 (×4): qty 1

## 2023-07-12 MED ORDER — FAMOTIDINE IN NACL 20-0.9 MG/50ML-% IV SOLN
20.0000 mg | Freq: Once | INTRAVENOUS | Status: AC
  Administered 2023-07-12: 20 mg via INTRAVENOUS
  Filled 2023-07-12: qty 50

## 2023-07-12 MED ORDER — DOCUSATE SODIUM 100 MG PO CAPS
100.0000 mg | ORAL_CAPSULE | Freq: Two times a day (BID) | ORAL | Status: DC
  Administered 2023-07-14 – 2023-07-17 (×7): 100 mg via ORAL
  Filled 2023-07-12 (×8): qty 1

## 2023-07-12 MED ORDER — IOHEXOL 350 MG/ML SOLN
75.0000 mL | Freq: Once | INTRAVENOUS | Status: AC | PRN
  Administered 2023-07-12: 75 mL via INTRAVENOUS

## 2023-07-12 MED ORDER — METAMUCIL FIBER PO CHEW: 3.0000 | CHEWABLE_TABLET | Freq: Every day | ORAL | Status: DC

## 2023-07-12 MED ORDER — ONDANSETRON HCL 4 MG/2ML IJ SOLN
4.0000 mg | Freq: Once | INTRAMUSCULAR | Status: AC
  Administered 2023-07-12: 4 mg via INTRAVENOUS
  Filled 2023-07-12: qty 2

## 2023-07-12 MED ORDER — PANTOPRAZOLE SODIUM 40 MG IV SOLR: 40.0000 mg | Freq: Two times a day (BID) | INTRAVENOUS | Status: DC

## 2023-07-12 MED ORDER — ACETAMINOPHEN 325 MG PO TABS
650.0000 mg | ORAL_TABLET | Freq: Four times a day (QID) | ORAL | Status: DC | PRN
  Administered 2023-07-17: 650 mg via ORAL
  Filled 2023-07-12: qty 2

## 2023-07-12 MED ORDER — SODIUM CHLORIDE 0.9 % IV BOLUS
1000.0000 mL | Freq: Once | INTRAVENOUS | Status: AC
  Administered 2023-07-12: 1000 mL via INTRAVENOUS

## 2023-07-12 MED ORDER — INFLUENZA VAC A&B SURF ANT ADJ 0.5 ML IM SUSY
0.5000 mL | PREFILLED_SYRINGE | INTRAMUSCULAR | Status: DC
  Filled 2023-07-12: qty 0.5

## 2023-07-12 MED ORDER — PANTOPRAZOLE INFUSION (NEW) - SIMPLE MED
8.0000 mg/h | INTRAVENOUS | Status: DC
  Administered 2023-07-12 – 2023-07-14 (×4): 8 mg/h via INTRAVENOUS
  Filled 2023-07-12 (×6): qty 100

## 2023-07-12 MED ORDER — DOCUSATE SODIUM 100 MG PO CAPS: 100.0000 mg | ORAL_CAPSULE | Freq: Two times a day (BID) | ORAL | Status: DC

## 2023-07-12 MED ORDER — ACETAMINOPHEN 650 MG RE SUPP: 650.0000 mg | Freq: Four times a day (QID) | RECTAL | Status: DC | PRN

## 2023-07-12 MED ORDER — SERTRALINE HCL 50 MG PO TABS
50.0000 mg | ORAL_TABLET | Freq: Every day | ORAL | Status: DC
  Administered 2023-07-13 – 2023-07-17 (×5): 50 mg via ORAL
  Filled 2023-07-12 (×6): qty 1

## 2023-07-12 NOTE — Assessment & Plan Note (Signed)
Incidental finding of 6mm nodule in RUL Recommended repeat non contrast CT chest at 6-12 months.  If stable at 6-12 months then future CT at 18-24 month considered optional for low risk patients, recommended for high risk.

## 2023-07-12 NOTE — ED Provider Notes (Signed)
[place] [facility] AT [facility] Provider Note   CSN: [id number] Arrival date & time: 07/12/23  8469     History  Chief Complaint  Patient presents with   Emesis    Frances Ortiz is a 89 y.o. female past history of anxiety, GERD, hyperlipidemia, melanoma, hip replacement on 06/22/2023 brought in by EMS for evaluation of vomiting. Symptoms have been going on in the last 4 days. She also reports some dark stool. Denies fever, cough, runny, chest pain, shortness of breath, abdominal pain, bower changes, urinary symptoms. History of colon cancer and colectomy. She is taking Lovenox in the 30 day period post surgery. EMS reports positive orthostatic. Patient is at rehab post hip replacement.    Emesis   Past Medical History:  Diagnosis Date   Anxiety    GERD (gastroesophageal reflux disease)    Heart murmur    HLD (hyperlipidemia)    Intestinal obstruction (HCC)    Osteoporosis    PONV (postoperative nausea and vomiting)    Skin cancer    melanoma, basal cell, and squamous   Past Surgical History:  Procedure Laterality Date   ANTERIOR APPROACH HEMI HIP ARTHROPLASTY Right 06/22/2023   Procedure: POSTERIOR APPROACH HEMI HIP ARTHROPLASTY;  Surgeon: Teryl Lucy, MD;  Location: [facility] OR;  Service: Orthopedics;  Laterality: Right;   BREAST BIOPSY     bilateral   BREAST EXCISIONAL BIOPSY Right 30 years ago   fibroadenoma   BREAST EXCISIONAL BIOPSY Left 40 years ago   sclerosis thickening   CATARACT EXTRACTION W/ INTRAOCULAR LENS  IMPLANT, BILATERAL Bilateral    CESAREAN SECTION     x 2   CHOLECYSTECTOMY N/A 11/27/2012   Procedure: LAPAROSCOPIC CHOLECYSTECTOMY WITH INTRAOPERATIVE CHOLANGIOGRAM;  Surgeon: Robyne Askew, MD;  Location: [facility] OR;  Service: General;  Laterality: N/A;   COLECTOMY Right 10/07/2017   right   COLONOSCOPY  2005   COLONOSCOPY  08/2017   "found mass in cecum"   DILATION AND CURETTAGE OF UTERUS     ESOPHAGOGASTRODUODENOSCOPY     IR  KYPHO EA ADDL LEVEL THORACIC OR LUMBAR  02/27/2023   IR KYPHO LUMBAR INC FX REDUCE BONE BX UNI/BIL CANNULATION INC/IMAGING  02/27/2023   LAPAROSCOPIC RIGHT COLECTOMY Right 10/07/2017   Procedure: LAPAROSCOPIC RIGHT COLECTOMY ERAS PATHWAY;  Surgeon: Griselda Miner, MD;  Location: [facility] OR;  Service: General;  Laterality: Right;   SKIN CANCER EXCISION     TONSILLECTOMY       Home Medications Prior to Admission medications   Medication Sig Start Date End Date Taking? Authorizing Provider  amLODipine (NORVASC) 5 MG tablet Take 1 tablet (5 mg total) by mouth daily. 06/25/23   Leroy Sea, MD  bisacodyl (DULCOLAX) 10 MG suppository Place 1 suppository (10 mg total) rectally daily as needed for moderate constipation. 06/25/23   Leroy Sea, MD  docusate sodium (COLACE) 100 MG capsule Take 1 capsule (100 mg total) by mouth 2 (two) times daily. 06/25/23   Leroy Sea, MD  enoxaparin (LOVENOX) 40 MG/0.4ML injection Inject 0.4 mLs (40 mg total) into the skin daily for 30 doses. For 30 days post op for DVT prophylaxis 06/23/23 07/23/23  Cecil Cobbs, PA-C  ferrous sulfate 325 (65 FE) MG tablet Take 1 tablet (325 mg total) by mouth daily with breakfast. 06/25/23   Leroy Sea, MD  Metamucil Fiber CHEW Chew 3 tablets by mouth daily.    [provider]  metoprolol tartrate (LOPRESSOR) 50  MG tablet Take 1 tablet (50 mg total) by mouth 2 (two) times daily. 06/25/23   Leroy Sea, MD  naproxen sodium (ALEVE) 220 MG tablet Take 220 mg by mouth in the morning, at noon, and at bedtime.    [provider]  polyethylene glycol (MIRALAX / GLYCOLAX) 17 g packet Take 17 g by mouth daily as needed for mild constipation. 06/25/23   Leroy Sea, MD  sertraline (ZOLOFT) 50 MG tablet Take 50 mg by mouth daily.    [provider]  traMADol (ULTRAM) 50 MG tablet Take 1 tablet (50 mg total) by mouth every 8 (eight) hours as needed for moderate pain. 06/23/23   Cecil Cobbs,  PA-C      Allergies    Aristocort [triamcinolone], Cortisone, Erythromycin, Other, and Hydrocodone-acetaminophen    Review of Systems   Review of Systems  Gastrointestinal:  Positive for vomiting.    Physical Exam Updated Vital Signs There were no vitals taken for this visit. Physical Exam Vitals and nursing note reviewed.  Constitutional:      Appearance: Normal appearance.  HENT:     Head: Normocephalic and atraumatic.     Mouth/Throat:     Mouth: Mucous membranes are moist.  Eyes:     General: No scleral icterus. Cardiovascular:     Rate and Rhythm: Normal rate and regular rhythm.     Pulses: Normal pulses.     Heart sounds: Normal heart sounds.  Pulmonary:     Effort: Pulmonary effort is normal.     Breath sounds: Normal breath sounds.  Abdominal:     General: Abdomen is flat.     Palpations: Abdomen is soft.     Tenderness: There is no abdominal tenderness.  Musculoskeletal:        General: No deformity.  Skin:    General: Skin is warm.     Coloration: Skin is pale.     Findings: No rash.  Neurological:     General: No focal deficit present.     Mental Status: She is alert.  Psychiatric:        Mood and Affect: Mood normal.     ED Results / Procedures / Treatments   Labs (all labs ordered are listed, but only abnormal results are displayed) Labs Reviewed - No data to display  EKG None  Radiology No results found.  Procedures Procedures    Medications Ordered in ED Medications - No data to display  ED Course/ Medical Decision Making/ A&P                                 Medical Decision Making Amount and/or Complexity of Data Reviewed Labs: ordered. Radiology: ordered.  Risk Prescription drug management. Decision regarding hospitalization.   This patient presents to the ED for vomiting, this involves an extensive number of treatment options, and is a complaint that carries with a high risk of complications and morbidity.  The  differential diagnosis includes PUD, gastritis, perforation, PUD, lower GI bleed, malignancy.  This is not an exhaustive list.  Lab tests: I ordered and personally interpreted labs.  The pertinent results include: WBC 12.9. Hbg 8.7. Platelets unremarkable. Electrolytes unremarkable. BUN, creatinine unremarkable.  POC Hemoccult is positive.  Troponin, lipase, viral panel unremarkable.  Imaging studies: I ordered imaging studies, personally reviewed, interpreted imaging and agree with the radiologist's interpretations. The results include: CT angio chest showed no PE.  CT abdomen pelvis showed increased common bile duct size.  Problem list/ ED course/ Critical interventions/ Medical management: HPI: See above Vital signs within normal range and stable throughout visit. Laboratory/imaging studies significant for: See above. On physical examination, patient is afebrile and appears in no acute distress.  Patient has no other complaints besides vomiting.  CBC showed leukocytosis at 12.3.  CT abdomen pelvis showed increased common bile duct dilation however liver function test is normal and patient has no abdominal pain.  Hemoglobin today is 8.7, decreased from 10.7 2 weeks ago.  Patient is currently taking Lovenox post hip surgery.  Labs otherwise unremarkable including lipase and troponin level.  Will consult gastroenterology.  Patient will require admission for upper GI bleed and hematemesis. I have reviewed the patient home medicines and have made adjustments as needed.  Cardiac monitoring/EKG: The patient was maintained on a cardiac monitor.  I personally reviewed and interpreted the cardiac monitor which showed an underlying rhythm of: sinus rhythm.  Additional history obtained: External records from outside source obtained and reviewed including: Chart review including previous notes, labs, imaging.  Consultations obtained: I spoke to [facility] gastroenterology.  She will come down and see  the patient. I spoke to Dr. Sheppard Penton [facility] hospitalist.  She agrees admit the patient.  Disposition Admit. This chart was dictated using voice recognition software.  Despite best efforts to proofread,  errors can occur which can change the documentation meaning.          Final Clinical Impression(s) / ED Diagnoses Final diagnoses:  Upper GI bleed    Rx / DC Orders ED Discharge Orders     None         Jeanelle Malling, [place] 07/12/23 2117    Elayne Snare K, DO 07/13/23 [phone]

## 2023-07-12 NOTE — ED Notes (Signed)
Help get patient on the monitor did EKG shown to er provider patient is resting with call bell in here

## 2023-07-12 NOTE — Assessment & Plan Note (Signed)
Right hip fracture s/p mechanical fall on 06/20/20 Underwent ORIF by Dr. Dion Saucier on 06/22/23 Has been in rehab at SNF on lovenox for VTE prophylaxis x 30 days  Hold lovenox

## 2023-07-12 NOTE — Assessment & Plan Note (Signed)
Hx of cecal cancer s/p right colectomy in December of 2019.  No chemo Last colonoscopy in 11/2018 with no abnormal findings but diverticulosis in sigmoid colon

## 2023-07-12 NOTE — Assessment & Plan Note (Addendum)
89 year old female presenting to ED with 2 day  history of N/V and large episode of bloody emesis with large clot this morning with dark stool  -obs to [facility] for UGIB -GI consulted, [facility]-Dr. Carole Binning  -fecal occult positive  -will give total 80mg  protonix and start protonix gtt -hold lovenox -serial cbc q6 hours -hgb 8.7 today was 10.7 two weeks ago  -transfuse if hgb <7.0, she consents to blood products  -has been using aleve daily for past 5+ days  -history of cecal cancer s/p right colectomy in 09/2018. Last colonoscopy in 11/2018 with diverticulosis in sigmoid otherwise wnl  -hx of EGD in 2014 with duodenal inflammation, innumerable number of polyps.

## 2023-07-12 NOTE — ED Triage Notes (Signed)
[facility]IBM from nursing home/rehab d/t reports of N/V for a few days. Pt vomited a blood clot this AM. Denies abd pain/fevers. Orthostatic for EMS.

## 2023-07-12 NOTE — H&P (Signed)
History and Physical    Patient: Frances Ortiz ZOX:[id number] DOB: [birth year] DOA: 07/12/2023 DOS: the patient was seen and examined on 07/12/2023 PCP: Eloisa Northern, MD  Patient coming from: SN[facility] - [facility] for rehab only. Typically at home with caregivers. Uses walker for ambulation.    Chief Complaint: nasuea/vomiting with bloody emesis.   HPI: Frances Ortiz is a 89 y.o. female with medical history significant of   hyperlipidemia, HTN, GERD, anxiety/depression, cecal cancer status post right colectomy presented to the ED with complaints of nausea and vomiting. She has had 2 days of N/V and today had an episode of bloody vomit with a large clot in it. She also had an episode of diarrhea yesterday.  She also noticed her stool was darker over the past 2 days, but she has been taking iron. No bright red blood in her stool.   She is on lovenox after a hip replacement done recently on 8/30 and has been on lovenox since that time. She has been taking aleve for 5 days. No caffeine. She does feel stressed with the recent surgery, surgery and rehab stay.   She had the colon resection in December 2019.  Colonoscopy 11/2018: no abnormal finding, diverticulosis in sigmoid colon. Recommended repeat in 5 years   EGD in 2014: duodenal inflammation. Innumerable number of polyps   He has been feeling good. Denies any fever/chills, vision changes/headaches, chest pain or palpitations, shortness of breath or cough, abdominal pain, N/V/D, dysuria or leg swelling.    She does not smoke or drink alcohol.   ER Course:  vitals: afebrile, bp: 116/65, HR: 115, RR: 19, oxygen: 97%RA Pertinent labs: wbc: 12.9, hgb: 8.7, platelets: 484, BUN: 42, fecal occult: positive  CT abdomen/pelvis:  In ED: Slightly increased common bile duct dilation, measuring up to 9 mm, previously 7 mm. New mild intrahepatic biliary ductal dilation. Correlate with liver function tests. If LFTs are abnormal, consider further evaluation  with MRCP. 2. Aortic Atherosclerosis CTA chest: no PE. . Bilateral mosaic attenuation, which can be seen in the setting of small airways disease or pulmonary hypertension.Solid pulmonary nodule of the posterior right upper lobe measuring 6 mm. Non-contrast chest CT at 6-12 months is recommended. If the nodule is stable at time of repeat CT, then future CT at 18-24 months (from today's scan) is considered optional for low-risk patients, but is recommended for high-risk patients. This recommendation follows the consensus statement: Guidelines for Management of Incidental Pulmonary Nodules Detected on CT Images: From the Fleischner Society 2017; Radiology 2017; 284:228-243. 4. Coronary artery calcifications In ED: GI consulted. Given 1L IVF bolus, zofran and protonix. [facility] asked to admit.      Review of Systems: As mentioned in the history of present illness. All other systems reviewed and are negative. Past Medical History:  Diagnosis Date   Anxiety    GERD (gastroesophageal reflux disease)    Heart murmur    HLD (hyperlipidemia)    Intestinal obstruction (HCC)    Osteoporosis    PONV (postoperative nausea and vomiting)    Skin cancer    melanoma, basal cell, and squamous   Past Surgical History:  Procedure Laterality Date   ANTERIOR APPROACH HEMI HIP ARTHROPLASTY Right 06/22/2023   Procedure: POSTERIOR APPROACH HEMI HIP ARTHROPLASTY;  Surgeon: Teryl Lucy, MD;  Location: [facility] OR;  Service: Orthopedics;  Laterality: Right;   BREAST BIOPSY     bilateral   BREAST EXCISIONAL BIOPSY Right 30 years ago   fibroadenoma   BREAST  EXCISIONAL BIOPSY Left 40 years ago   sclerosis thickening   CATARACT EXTRACTION W/ INTRAOCULAR LENS  IMPLANT, BILATERAL Bilateral    CESAREAN SECTION     x 2   CHOLECYSTECTOMY N/A 11/27/2012   Procedure: LAPAROSCOPIC CHOLECYSTECTOMY WITH INTRAOPERATIVE CHOLANGIOGRAM;  Surgeon: Robyne Askew, MD;  Location: [facility] OR;  Service: General;  Laterality: N/A;   COLECTOMY  Right 10/07/2017   right   COLONOSCOPY  2005   COLONOSCOPY  08/2017   "found mass in cecum"   DILATION AND CURETTAGE OF UTERUS     ESOPHAGOGASTRODUODENOSCOPY     IR KYPHO EA ADDL LEVEL THORACIC OR LUMBAR  02/27/2023   IR KYPHO LUMBAR INC FX REDUCE BONE BX UNI/BIL CANNULATION INC/IMAGING  02/27/2023   LAPAROSCOPIC RIGHT COLECTOMY Right 10/07/2017   Procedure: LAPAROSCOPIC RIGHT COLECTOMY ERAS PATHWAY;  Surgeon: Griselda Miner, MD;  Location: [facility] OR;  Service: General;  Laterality: Right;   SKIN CANCER EXCISION     TONSILLECTOMY     Social History:  reports that she has never smoked. She has never used smokeless tobacco. She reports that she does not drink alcohol and does not use drugs.  Allergies  Allergen Reactions   Aristocort [Triamcinolone] Other (See Comments)    Emotional instability   Cortisone Other (See Comments)    Emotional instability   Erythromycin Other (See Comments)    Stomach pain    Other Other (See Comments)    Epinephrine in novocain makes heart race.   Hydrocodone-Acetaminophen Rash    Family History  Problem Relation Age of Onset   Pneumonia Mother    Colon cancer Neg Hx    Colon polyps Neg Hx    Rectal cancer Neg Hx    Stomach cancer Neg Hx     Prior to Admission medications   Medication Sig Start Date End Date Taking? Authorizing Provider  amLODipine (NORVASC) 5 MG tablet Take 1 tablet (5 mg total) by mouth daily. 06/25/23   Leroy Sea, MD  bisacodyl (DULCOLAX) 10 MG suppository Place 1 suppository (10 mg total) rectally daily as needed for moderate constipation. 06/25/23   Leroy Sea, MD  docusate sodium (COLACE) 100 MG capsule Take 1 capsule (100 mg total) by mouth 2 (two) times daily. 06/25/23   Leroy Sea, MD  enoxaparin (LOVENOX) 40 MG/0.4ML injection Inject 0.4 mLs (40 mg total) into the skin daily for 30 doses. For 30 days post op for DVT prophylaxis 06/23/23 07/23/23  Cecil Cobbs, PA-C  ferrous sulfate 325 (65 FE) MG tablet  Take 1 tablet (325 mg total) by mouth daily with breakfast. 06/25/23   Leroy Sea, MD  Metamucil Fiber CHEW Chew 3 tablets by mouth daily.    [provider]  metoprolol tartrate (LOPRESSOR) 50 MG tablet Take 1 tablet (50 mg total) by mouth 2 (two) times daily. 06/25/23   Leroy Sea, MD  naproxen sodium (ALEVE) 220 MG tablet Take 220 mg by mouth in the morning, at noon, and at bedtime.    [provider]  polyethylene glycol (MIRALAX / GLYCOLAX) 17 g packet Take 17 g by mouth daily as needed for mild constipation. 06/25/23   Leroy Sea, MD  sertraline (ZOLOFT) 50 MG tablet Take 50 mg by mouth daily.    [provider]  traMADol (ULTRAM) 50 MG tablet Take 1 tablet (50 mg total) by mouth every 8 (eight) hours as needed for moderate pain. 06/23/23   Cecil Cobbs, PA-C  Physical Exam: Vitals:   07/12/23 1320 07/12/23 1330 07/12/23 1400 07/12/23 1430  BP:  (!) 106/58 (!) 109/57 116/63  Pulse: (!) 104 (!) 109 (!) 106 (!) 108  Resp: (!) 23 18 13 15   Temp: 98 F (36.7 C)     TempSrc: Oral     SpO2: 95% 95% 94% 97%  Weight:      Height:       General:  Appears calm and comfortable and is in NAD. Pale appearing  Eyes:  PERRL, EOMI, normal lids, iris ENT:  grossly normal hearing, lips & tongue, mmm; appropriate dentition Neck:  no LAD, masses or thyromegaly; no carotid bruits Cardiovascular:  RRR, no m/r/g. No LE edema.  Respiratory:   CTA bilaterally with no wheezes/rales/rhonchi.  Normal respiratory effort. Abdomen:  soft, NT, ND, NABS Back:   normal alignment, no CVAT Skin:  no rash or induration seen on limited exam Musculoskeletal:  grossly normal tone BUE/BLE, good ROM, no bony abnormality/ right hip incision healing well.  Lower extremity:  No LE edema.  Limited foot exam with no ulcerations.  2+ distal pulses. Psychiatric:  grossly normal mood and affect, speech fluent and appropriate, AOx3 Neurologic:  CN 2-12 grossly intact, moves all  extremities in coordinated fashion, sensation intact   Radiological Exams on Admission: Independently reviewed - see discussion in A/P where applicable  CT Angio Chest PE W and/or Wo Contrast  Result Date: 07/12/2023 CLINICAL DATA:  Nausea and vomiting EXAM: CT ANGIOGRAPHY CHEST CT ABDOMEN AND PELVIS WITH CONTRAST TECHNIQUE: Multidetector CT imaging of the chest was performed using the standard protocol during bolus administration of intravenous contrast. Multiplanar CT image reconstructions and MIPs were obtained to evaluate the vascular anatomy. Multidetector CT imaging of the abdomen and pelvis was performed using the standard protocol during bolus administration of intravenous contrast. RADIATION DOSE REDUCTION: This exam was performed according to the departmental dose-optimization program which includes automated exposure control, adjustment of the mA and/or kV according to patient size and/or use of iterative reconstruction technique. CONTRAST:  75mL OMNIPAQUE IOHEXOL 350 MG/ML SOLN COMPARISON:  None Available. FINDINGS: CTA CHEST FINDINGS Cardiovascular: No evidence of pulmonary embolus. Normal heart size. No pericardial effusion. Normal caliber thoracic aorta with mild atherosclerotic disease. Severe coronary artery calcifications. Mildly dilated main pulmonary artery, measuring up to 3.2 cm. Mediastinum/Nodes: Small hiatal hernia. Thyroid is unremarkable. No enlarged lymph nodes seen in the chest. Lungs/Pleura: Central airways are patent. Bilateral mosaic attenuation. Solid pulmonary nodule of the posterior right upper lobe measuring 6 mm on series 4, image 34. Musculoskeletal: No chest wall abnormality. No acute or significant osseous findings. Review of the MIP images confirms the above findings. CT ABDOMEN and PELVIS FINDINGS Hepatobiliary: No suspicious liver lesion. Prior cholecystectomy. Slightly increased common bile duct dilation, measuring up to 9 mm, previously 7 mm. New mild intrahepatic  biliary ductal dilation. Pancreas: Unremarkable. No pancreatic ductal dilatation or surrounding inflammatory changes. Spleen: Normal in size without focal abnormality. Adrenals/Urinary Tract: Unchanged right adrenal gland adenoma measuring up to 1.6 cm, consistent with benign adenoma given long-term stability, no specific follow-up imaging is necessary. Bilateral left-greater-than-right parapelvic cysts. Additional bilateral low-attenuation renal lesions, largest are consistent with simple cysts, others are too small to accurately characterize, no specific follow-up imaging is necessary. Punctate left-greater-than-right nonobstructing renal stones. Bladder is unremarkable. Stomach/Bowel: Stomach is within normal limits. Small duodenal diverticulum. Prior ileocecectomy. Diverticulosis. No evidence of bowel wall thickening, distention, or inflammatory changes. Vascular/Lymphatic: Mild aortic atherosclerosis. No enlarged abdominal or  pelvic lymph nodes. Reproductive: Limits evaluation due to streak artifact. Uterus is unremarkable. Fluid noted in the vaginal cuff. Other: No abdominal wall hernia or abnormality. No abdominopelvic ascites. Musculoskeletal: Prior right hip replacement. Multilevel compression deformities of the lumbar spine with kyphoplasty changes at L2-L3. Unchanged mild anterolisthesis of L4 on L5. Review of the MIP images confirms the above findings. IMPRESSION: Chest CTA: 1. No evidence of pulmonary embolus. 2. Bilateral mosaic attenuation, which can be seen in the setting of small airways disease or pulmonary hypertension. 3. Solid pulmonary nodule of the posterior right upper lobe measuring 6 mm. Non-contrast chest CT at 6-12 months is recommended. If the nodule is stable at time of repeat CT, then future CT at 18-24 months (from today's scan) is considered optional for low-risk patients, but is recommended for high-risk patients. This recommendation follows the consensus statement: Guidelines for  Management of Incidental Pulmonary Nodules Detected on CT Images: From the Fleischner Society 2017; Radiology 2017; 284:228-243. 4. Coronary artery calcifications Abdomen and pelvis CT: 1. Slightly increased common bile duct dilation, measuring up to 9 mm, previously 7 mm. New mild intrahepatic biliary ductal dilation. Correlate with liver function tests. If LFTs are abnormal, consider further evaluation with MRCP. 2. Aortic Atherosclerosis (ICD10-I70.0). Electronically Signed   By: Allegra Lai M.D.   On: 07/12/2023 13:18   CT ABDOMEN PELVIS W CONTRAST  Result Date: 07/12/2023 CLINICAL DATA:  Nausea and vomiting EXAM: CT ANGIOGRAPHY CHEST CT ABDOMEN AND PELVIS WITH CONTRAST TECHNIQUE: Multidetector CT imaging of the chest was performed using the standard protocol during bolus administration of intravenous contrast. Multiplanar CT image reconstructions and MIPs were obtained to evaluate the vascular anatomy. Multidetector CT imaging of the abdomen and pelvis was performed using the standard protocol during bolus administration of intravenous contrast. RADIATION DOSE REDUCTION: This exam was performed according to the departmental dose-optimization program which includes automated exposure control, adjustment of the mA and/or kV according to patient size and/or use of iterative reconstruction technique. CONTRAST:  75mL OMNIPAQUE IOHEXOL 350 MG/ML SOLN COMPARISON:  None Available. FINDINGS: CTA CHEST FINDINGS Cardiovascular: No evidence of pulmonary embolus. Normal heart size. No pericardial effusion. Normal caliber thoracic aorta with mild atherosclerotic disease. Severe coronary artery calcifications. Mildly dilated main pulmonary artery, measuring up to 3.2 cm. Mediastinum/Nodes: Small hiatal hernia. Thyroid is unremarkable. No enlarged lymph nodes seen in the chest. Lungs/Pleura: Central airways are patent. Bilateral mosaic attenuation. Solid pulmonary nodule of the posterior right upper lobe measuring 6  mm on series 4, image 34. Musculoskeletal: No chest wall abnormality. No acute or significant osseous findings. Review of the MIP images confirms the above findings. CT ABDOMEN and PELVIS FINDINGS Hepatobiliary: No suspicious liver lesion. Prior cholecystectomy. Slightly increased common bile duct dilation, measuring up to 9 mm, previously 7 mm. New mild intrahepatic biliary ductal dilation. Pancreas: Unremarkable. No pancreatic ductal dilatation or surrounding inflammatory changes. Spleen: Normal in size without focal abnormality. Adrenals/Urinary Tract: Unchanged right adrenal gland adenoma measuring up to 1.6 cm, consistent with benign adenoma given long-term stability, no specific follow-up imaging is necessary. Bilateral left-greater-than-right parapelvic cysts. Additional bilateral low-attenuation renal lesions, largest are consistent with simple cysts, others are too small to accurately characterize, no specific follow-up imaging is necessary. Punctate left-greater-than-right nonobstructing renal stones. Bladder is unremarkable. Stomach/Bowel: Stomach is within normal limits. Small duodenal diverticulum. Prior ileocecectomy. Diverticulosis. No evidence of bowel wall thickening, distention, or inflammatory changes. Vascular/Lymphatic: Mild aortic atherosclerosis. No enlarged abdominal or pelvic lymph nodes. Reproductive: Limits evaluation due to  streak artifact. Uterus is unremarkable. Fluid noted in the vaginal cuff. Other: No abdominal wall hernia or abnormality. No abdominopelvic ascites. Musculoskeletal: Prior right hip replacement. Multilevel compression deformities of the lumbar spine with kyphoplasty changes at L2-L3. Unchanged mild anterolisthesis of L4 on L5. Review of the MIP images confirms the above findings. IMPRESSION: Chest CTA: 1. No evidence of pulmonary embolus. 2. Bilateral mosaic attenuation, which can be seen in the setting of small airways disease or pulmonary hypertension. 3. Solid  pulmonary nodule of the posterior right upper lobe measuring 6 mm. Non-contrast chest CT at 6-12 months is recommended. If the nodule is stable at time of repeat CT, then future CT at 18-24 months (from today's scan) is considered optional for low-risk patients, but is recommended for high-risk patients. This recommendation follows the consensus statement: Guidelines for Management of Incidental Pulmonary Nodules Detected on CT Images: From the Fleischner Society 2017; Radiology 2017; 284:228-243. 4. Coronary artery calcifications Abdomen and pelvis CT: 1. Slightly increased common bile duct dilation, measuring up to 9 mm, previously 7 mm. New mild intrahepatic biliary ductal dilation. Correlate with liver function tests. If LFTs are abnormal, consider further evaluation with MRCP. 2. Aortic Atherosclerosis (ICD10-I70.0). Electronically Signed   By: Allegra Lai M.D.   On: 07/12/2023 13:18    EKG: Independently reviewed.  NSR with rate 113; nonspecific ST changes with no evidence of acute ischemia. LAFB.    Labs on Admission: I have personally reviewed the available labs and imaging studies at the time of the admission.  Pertinent labs:    wbc: 12.9,  hgb: 8.7, (10.7)  platelets: 484,  BUN: 42,  fecal occult: positive   Assessment and Plan: Principal Problem:   Upper GI bleed Active Problems:   Pulmonary nodule   Acute blood loss anemia   Essential hypertension   Cecal cancer (HCC)   Hip fracture (HCC)   Anxiety and depression    Assessment and Plan: * Upper GI bleed 89 year old female presenting to ED with 2 day  history of N/V and large episode of bloody emesis with large clot this morning with dark stool  -obs to progressive for UGIB -GI consulted, [facility]-Dr. Carole Binning  -fecal occult positive  -will give total 80mg  protonix and start protonix gtt -hold lovenox -serial cbc q6 hours -hgb 8.7 today was 10.7 two weeks ago  -transfuse if hgb <7.0, she consents to blood products   -has been using aleve daily for past 5+ days  -history of cecal cancer s/p right colectomy in 09/2018. Last colonoscopy in 11/2018 with diverticulosis in sigmoid otherwise wnl  -hx of EGD in 2014 with duodenal inflammation, innumerable number of polyps.   Pulmonary nodule Incidental finding of 6mm nodule in RUL Recommended repeat non contrast CT chest at 6-12 months.  If stable at 6-12 months then future CT at 18-24 month considered optional for low risk patients, recommended for high risk.   Acute blood loss anemia No history of anemia. Post op hgb was 10.7 Today 8.7 with positive fecal occult Will check iron studies  Transfuse if hgb <7.0    Essential hypertension Bp soft to normotensive Hold home norvasc and lopressor   Cecal cancer (HCC) Hx of cecal cancer s/p right colectomy in December of 2019.  No chemo Last colonoscopy in 11/2018 with no abnormal findings but diverticulosis in sigmoid colon   Hip fracture (HCC) Right hip fracture s/p mechanical fall on 06/20/20 Underwent ORIF by Dr. Dion Saucier on 06/22/23 Has been in rehab  at SNF on lovenox for VTE prophylaxis x 30 days  Hold lovenox   Anxiety and depression Continue zoloft     Advance Care Planning:   Code Status: Limited: Do not attempt resuscitation (DNR) -DNR-LIMITED -Do Not Intubate/DNI    Consults: GI  DVT Prophylaxis: SCDs   Family Communication: daughter at bedside   Severity of Illness: The appropriate patient status for this patient is OBSERVATION. Observation status is judged to be reasonable and necessary in order to provide the required intensity of service to ensure the patient's safety. The patient's presenting symptoms, physical exam findings, and initial radiographic and laboratory data in the context of their medical condition is felt to place them at decreased risk for further clinical deterioration. Furthermore, it is anticipated that the patient will be medically stable for discharge from the  hospital within 2 midnights of admission.   Author: Orland Mustard, MD 07/12/2023 4:16 PM  For on call review [email or web address].

## 2023-07-12 NOTE — ED Notes (Signed)
ED TO INPATIENT HANDOFF REPORT  ED Nurse Name and Phone #: Dahlia Client [phone]  S Name/Age/Gender Frances Ortiz 89 y.o. female Room/Bed: 025C/025C  Code Status   Code Status: Prior  Home/SNF/Other Rehab Patient oriented to: self, place, time, and situation Is this baseline? Yes   Triage Complete: Triage complete  Chief Complaint Nvd  Triage Note [facility] from nursing home/rehab d/t reports of N/V for a few days. Pt vomited a blood clot this AM. Denies abd pain/fevers. Orthostatic for EMS.    Allergies Allergies  Allergen Reactions   Aristocort [Triamcinolone] Other (See Comments)    Emotional instability   Cortisone Other (See Comments)    Emotional instability   Erythromycin Other (See Comments)    Stomach pain    Other Other (See Comments)    Epinephrine in novocain makes heart race.   Hydrocodone-Acetaminophen Rash    Level of Care/Admitting Diagnosis ED Disposition     None       B Medical/Surgery History Past Medical History:  Diagnosis Date   Anxiety    GERD (gastroesophageal reflux disease)    Heart murmur    HLD (hyperlipidemia)    Intestinal obstruction (HCC)    Osteoporosis    PONV (postoperative nausea and vomiting)    Skin cancer    melanoma, basal cell, and squamous   Past Surgical History:  Procedure Laterality Date   ANTERIOR APPROACH HEMI HIP ARTHROPLASTY Right 06/22/2023   Procedure: POSTERIOR APPROACH HEMI HIP ARTHROPLASTY;  Surgeon: Teryl Lucy, MD;  Location: [facility] OR;  Service: Orthopedics;  Laterality: Right;   BREAST BIOPSY     bilateral   BREAST EXCISIONAL BIOPSY Right 30 years ago   fibroadenoma   BREAST EXCISIONAL BIOPSY Left 40 years ago   sclerosis thickening   CATARACT EXTRACTION W/ INTRAOCULAR LENS  IMPLANT, BILATERAL Bilateral    CESAREAN SECTION     x 2   CHOLECYSTECTOMY N/A 11/27/2012   Procedure: LAPAROSCOPIC CHOLECYSTECTOMY WITH INTRAOPERATIVE CHOLANGIOGRAM;  Surgeon: Robyne Askew, MD;  Location: [facility] OR;  Service:  General;  Laterality: N/A;   COLECTOMY Right 10/07/2017   right   COLONOSCOPY  2005   COLONOSCOPY  08/2017   "found mass in cecum"   DILATION AND CURETTAGE OF UTERUS     ESOPHAGOGASTRODUODENOSCOPY     IR KYPHO EA ADDL LEVEL THORACIC OR LUMBAR  02/27/2023   IR KYPHO LUMBAR INC FX REDUCE BONE BX UNI/BIL CANNULATION INC/IMAGING  02/27/2023   LAPAROSCOPIC RIGHT COLECTOMY Right 10/07/2017   Procedure: LAPAROSCOPIC RIGHT COLECTOMY ERAS PATHWAY;  Surgeon: Griselda Miner, MD;  Location: [facility] OR;  Service: General;  Laterality: Right;   SKIN CANCER EXCISION     TONSILLECTOMY       A IV Location/Drains/Wounds Patient Lines/Drains/Airways Status     Active Line/Drains/Airways     Name Placement date Placement time Site Days   Peripheral IV 07/12/23 20 G Left Antecubital 07/12/23  0952  Antecubital  less than 1   Incision - 4 Ports Abdomen 1: Umbilicus 2: Mid;Upper 3: Right;Medial 4: Right;Lateral 11/27/12  1102  -- 3879   Wound / Incision (Open or Dehisced) 02/27/23 Puncture Vertebral column Lower;Right;Left 02/27/23  1048  Vertebral column  135            Intake/Output Last 24 hours  Intake/Output Summary (Last 24 hours) at 07/12/2023 1346 Last data filed at 07/12/2023 1232 Gross per 24 hour  Intake 1050 ml  Output --  Net 1050 ml    Labs/Imaging  Results for orders placed or performed during the hospital encounter of 07/12/23 (from the past 48 hour(s))  Comprehensive metabolic panel     Status: Abnormal   Collection Time: 07/12/23  9:55 AM  Result Value Ref Range   Sodium 141 135 - 145 mmol/L   Potassium 3.9 3.5 - 5.1 mmol/L   Chloride 103 98 - 111 mmol/L   CO2 27 22 - 32 mmol/L   Glucose, Bld 125 (H) 70 - 99 mg/dL    Comment: Glucose reference range applies only to samples taken after fasting for at least 8 hours.   BUN 42 (H) 8 - 23 mg/dL   Creatinine, Ser 1.61 0.44 - 1.00 mg/dL   Calcium 9.0 8.9 - 09.6 mg/dL   Total Protein 6.3 (L) 6.5 - 8.1 g/dL   Albumin 3.2 (L) 3.5 - 5.0  g/dL   AST 18 15 - 41 U/L   ALT 12 0 - 44 U/L   Alkaline Phosphatase 75 38 - 126 U/L   Total Bilirubin 0.5 0.3 - 1.2 mg/dL   GFR, Estimated >04 >54 mL/min    Comment: (NOTE) Calculated using the CKD-EPI Creatinine Equation (2021)    Anion gap 11 5 - 15    Comment: Performed at [facility], [address], [place], [place] [address]  Lipase, blood     Status: None   Collection Time: 07/12/23  9:55 AM  Result Value Ref Range   Lipase 28 11 - 51 U/L    Comment: Performed at [facility], [address] [place], [place] [address]  CBC with Diff     Status: Abnormal   Collection Time: 07/12/23  9:55 AM  Result Value Ref Range   WBC 12.9 (H) 4.0 - 10.5 K/uL   RBC 2.84 (L) 3.87 - 5.11 MIL/uL   Hemoglobin 8.7 (L) 12.0 - 15.0 g/dL   HCT 29.5 (L) 62.1 - 30.8 %   MCV 98.2 80.0 - 100.0 fL   MCH 30.6 26.0 - 34.0 pg   MCHC 31.2 30.0 - 36.0 g/dL   RDW 65.7 84.6 - 96.2 %   Platelets 484 (H) 150 - 400 K/uL   nRBC 0.0 0.0 - 0.2 %   Neutrophils Relative % 80 %   Neutro Abs 10.4 (H) 1.7 - 7.7 K/uL   Lymphocytes Relative 12 %   Lymphs Abs 1.6 0.7 - 4.0 K/uL   Monocytes Relative 5 %   Monocytes Absolute 0.6 0.1 - 1.0 K/uL   Eosinophils Relative 2 %   Eosinophils Absolute 0.2 0.0 - 0.5 K/uL   Basophils Relative 1 %   Basophils Absolute 0.1 0.0 - 0.1 K/uL   Immature Granulocytes 0 %   Abs Immature Granulocytes 0.05 0.00 - 0.07 K/uL    Comment: Performed at [facility], [address], [place], [place] [address]  Troponin I (High Sensitivity)     Status: None   Collection Time: 07/12/23  9:55 AM  Result Value Ref Range   Troponin I (High Sensitivity) 5 <18 ng/L    Comment: (NOTE) Elevated high sensitivity troponin I (hsTnI) values and significant  changes across serial measurements may suggest ACS but many other  chronic and acute conditions are known to elevate hsTnI results.  Refer to the "Links" section for chest pain algorithms and additional  guidance. Performed  at [facility], [address] [place], [place] [address]   Resp panel by RT-PCR (RSV, Flu A&B, Covid) Anterior Nasal  Swab     Status: None   Collection Time: 07/12/23 10:03 AM   Specimen: Anterior Nasal Swab  Result Value Ref Range   SARS Coronavirus 2 by RT PCR NEGATIVE NEGATIVE   Influenza A by PCR NEGATIVE NEGATIVE   Influenza B by PCR NEGATIVE NEGATIVE    Comment: (NOTE) The Xpert Xpress SARS-CoV-2/FLU/RSV plus assay is intended as an aid in the diagnosis of influenza from Nasopharyngeal swab specimens and should not be used as a sole basis for treatment. Nasal washings and aspirates are unacceptable for Xpert Xpress SARS-CoV-2/FLU/RSV testing.  Fact Sheet for Patients: BloggerCourse.com  Fact Sheet for Healthcare Providers: SeriousBroker.it  This test is not yet approved or cleared by the [place] FDA and has been authorized for detection and/or diagnosis of SARS-CoV-2 by FDA under an Emergency Use Authorization (EUA). This EUA will remain in effect (meaning this test can be used) for the duration of the COVID-19 declaration under Section 564(b)(1) of the Act, 21 U.S.C. section 360bbb-3(b)(1), unless the authorization is terminated or revoked.     Resp Syncytial Virus by PCR NEGATIVE NEGATIVE    Comment: (NOTE) Fact Sheet for Patients: BloggerCourse.com  Fact Sheet for Healthcare Providers: SeriousBroker.it  This test is not yet approved or cleared by the [place] FDA and has been authorized for detection and/or diagnosis of SARS-CoV-2 by FDA under an Emergency Use Authorization (EUA). This EUA will remain in effect (meaning this test can be used) for the duration of the COVID-19 declaration under Section 564(b)(1) of the Act, 21 U.S.C. section 360bbb-3(b)(1), unless the authorization is terminated or revoked.  Performed at [facility],  [address] [place], [place] [address]   POC occult blood, ED     Status: Abnormal   Collection Time: 07/12/23 12:22 PM  Result Value Ref Range   Fecal Occult Bld POSITIVE (A) NEGATIVE   CT Angio Chest PE W and/or Wo Contrast  Result Date: 07/12/2023 CLINICAL DATA:  Nausea and vomiting EXAM: CT ANGIOGRAPHY CHEST CT ABDOMEN AND PELVIS WITH CONTRAST TECHNIQUE: Multidetector CT imaging of the chest was performed using the standard protocol during bolus administration of intravenous contrast. Multiplanar CT image reconstructions and MIPs were obtained to evaluate the vascular anatomy. Multidetector CT imaging of the abdomen and pelvis was performed using the standard protocol during bolus administration of intravenous contrast. RADIATION DOSE REDUCTION: This exam was performed according to the departmental dose-optimization program which includes automated exposure control, adjustment of the mA and/or kV according to patient size and/or use of iterative reconstruction technique. CONTRAST:  75mL OMNIPAQUE IOHEXOL 350 MG/ML SOLN COMPARISON:  None Available. FINDINGS: CTA CHEST FINDINGS Cardiovascular: No evidence of pulmonary embolus. Normal heart size. No pericardial effusion. Normal caliber thoracic aorta with mild atherosclerotic disease. Severe coronary artery calcifications. Mildly dilated main pulmonary artery, measuring up to 3.2 cm. Mediastinum/Nodes: Small hiatal hernia. Thyroid is unremarkable. No enlarged lymph nodes seen in the chest. Lungs/Pleura: Central airways are patent. Bilateral mosaic attenuation. Solid pulmonary nodule of the posterior right upper lobe measuring 6 mm on series 4, image 34. Musculoskeletal: No chest wall abnormality. No acute or significant osseous findings. Review of the MIP images confirms the above findings. CT ABDOMEN and PELVIS FINDINGS Hepatobiliary: No suspicious liver lesion. Prior cholecystectomy. Slightly increased common bile duct dilation, measuring up to 9 mm,  previously 7 mm. New mild intrahepatic biliary ductal dilation. Pancreas: Unremarkable. No pancreatic ductal dilatation or surrounding inflammatory changes. Spleen: Normal in size without focal abnormality. Adrenals/Urinary Tract:  Unchanged right adrenal gland adenoma measuring up to 1.6 cm, consistent with benign adenoma given long-term stability, no specific follow-up imaging is necessary. Bilateral left-greater-than-right parapelvic cysts. Additional bilateral low-attenuation renal lesions, largest are consistent with simple cysts, others are too small to accurately characterize, no specific follow-up imaging is necessary. Punctate left-greater-than-right nonobstructing renal stones. Bladder is unremarkable. Stomach/Bowel: Stomach is within normal limits. Small duodenal diverticulum. Prior ileocecectomy. Diverticulosis. No evidence of bowel wall thickening, distention, or inflammatory changes. Vascular/Lymphatic: Mild aortic atherosclerosis. No enlarged abdominal or pelvic lymph nodes. Reproductive: Limits evaluation due to streak artifact. Uterus is unremarkable. Fluid noted in the vaginal cuff. Other: No abdominal wall hernia or abnormality. No abdominopelvic ascites. Musculoskeletal: Prior right hip replacement. Multilevel compression deformities of the lumbar spine with kyphoplasty changes at L2-L3. Unchanged mild anterolisthesis of L4 on L5. Review of the MIP images confirms the above findings. IMPRESSION: Chest CTA: 1. No evidence of pulmonary embolus. 2. Bilateral mosaic attenuation, which can be seen in the setting of small airways disease or pulmonary hypertension. 3. Solid pulmonary nodule of the posterior right upper lobe measuring 6 mm. Non-contrast chest CT at 6-12 months is recommended. If the nodule is stable at time of repeat CT, then future CT at 18-24 months (from today's scan) is considered optional for low-risk patients, but is recommended for high-risk patients. This recommendation follows  the consensus statement: Guidelines for Management of Incidental Pulmonary Nodules Detected on CT Images: From the Fleischner Society 2017; Radiology 2017; 284:228-243. 4. Coronary artery calcifications Abdomen and pelvis CT: 1. Slightly increased common bile duct dilation, measuring up to 9 mm, previously 7 mm. New mild intrahepatic biliary ductal dilation. Correlate with liver function tests. If LFTs are abnormal, consider further evaluation with MRCP. 2. Aortic Atherosclerosis (ICD10-I70.0). Electronically Signed   By: Allegra Lai M.D.   On: 07/12/2023 13:18   CT ABDOMEN PELVIS W CONTRAST  Result Date: 07/12/2023 CLINICAL DATA:  Nausea and vomiting EXAM: CT ANGIOGRAPHY CHEST CT ABDOMEN AND PELVIS WITH CONTRAST TECHNIQUE: Multidetector CT imaging of the chest was performed using the standard protocol during bolus administration of intravenous contrast. Multiplanar CT image reconstructions and MIPs were obtained to evaluate the vascular anatomy. Multidetector CT imaging of the abdomen and pelvis was performed using the standard protocol during bolus administration of intravenous contrast. RADIATION DOSE REDUCTION: This exam was performed according to the departmental dose-optimization program which includes automated exposure control, adjustment of the mA and/or kV according to patient size and/or use of iterative reconstruction technique. CONTRAST:  75mL OMNIPAQUE IOHEXOL 350 MG/ML SOLN COMPARISON:  None Available. FINDINGS: CTA CHEST FINDINGS Cardiovascular: No evidence of pulmonary embolus. Normal heart size. No pericardial effusion. Normal caliber thoracic aorta with mild atherosclerotic disease. Severe coronary artery calcifications. Mildly dilated main pulmonary artery, measuring up to 3.2 cm. Mediastinum/Nodes: Small hiatal hernia. Thyroid is unremarkable. No enlarged lymph nodes seen in the chest. Lungs/Pleura: Central airways are patent. Bilateral mosaic attenuation. Solid pulmonary nodule of the  posterior right upper lobe measuring 6 mm on series 4, image 34. Musculoskeletal: No chest wall abnormality. No acute or significant osseous findings. Review of the MIP images confirms the above findings. CT ABDOMEN and PELVIS FINDINGS Hepatobiliary: No suspicious liver lesion. Prior cholecystectomy. Slightly increased common bile duct dilation, measuring up to 9 mm, previously 7 mm. New mild intrahepatic biliary ductal dilation. Pancreas: Unremarkable. No pancreatic ductal dilatation or surrounding inflammatory changes. Spleen: Normal in size without focal abnormality. Adrenals/Urinary Tract: Unchanged right adrenal gland adenoma measuring up to  1.6 cm, consistent with benign adenoma given long-term stability, no specific follow-up imaging is necessary. Bilateral left-greater-than-right parapelvic cysts. Additional bilateral low-attenuation renal lesions, largest are consistent with simple cysts, others are too small to accurately characterize, no specific follow-up imaging is necessary. Punctate left-greater-than-right nonobstructing renal stones. Bladder is unremarkable. Stomach/Bowel: Stomach is within normal limits. Small duodenal diverticulum. Prior ileocecectomy. Diverticulosis. No evidence of bowel wall thickening, distention, or inflammatory changes. Vascular/Lymphatic: Mild aortic atherosclerosis. No enlarged abdominal or pelvic lymph nodes. Reproductive: Limits evaluation due to streak artifact. Uterus is unremarkable. Fluid noted in the vaginal cuff. Other: No abdominal wall hernia or abnormality. No abdominopelvic ascites. Musculoskeletal: Prior right hip replacement. Multilevel compression deformities of the lumbar spine with kyphoplasty changes at L2-L3. Unchanged mild anterolisthesis of L4 on L5. Review of the MIP images confirms the above findings. IMPRESSION: Chest CTA: 1. No evidence of pulmonary embolus. 2. Bilateral mosaic attenuation, which can be seen in the setting of small airways disease or  pulmonary hypertension. 3. Solid pulmonary nodule of the posterior right upper lobe measuring 6 mm. Non-contrast chest CT at 6-12 months is recommended. If the nodule is stable at time of repeat CT, then future CT at 18-24 months (from today's scan) is considered optional for low-risk patients, but is recommended for high-risk patients. This recommendation follows the consensus statement: Guidelines for Management of Incidental Pulmonary Nodules Detected on CT Images: From the Fleischner Society 2017; Radiology 2017; 284:228-243. 4. Coronary artery calcifications Abdomen and pelvis CT: 1. Slightly increased common bile duct dilation, measuring up to 9 mm, previously 7 mm. New mild intrahepatic biliary ductal dilation. Correlate with liver function tests. If LFTs are abnormal, consider further evaluation with MRCP. 2. Aortic Atherosclerosis (ICD10-I70.0). Electronically Signed   By: Allegra Lai M.D.   On: 07/12/2023 13:18    Pending Labs Unresulted Labs (From admission, onward)     Start     Ordered   07/12/23 0950  Urinalysis, Routine w reflex microscopic -Urine, Clean Catch  Once,   URGENT       Question:  Specimen Source  Answer:  Urine, Clean Catch   07/12/23 0953            Vitals/Pain Today's Vitals   07/12/23 1100 07/12/23 1248 07/12/23 1320 07/12/23 1330  BP: 133/64 128/60  (!) 106/58  Pulse: 95 (!) 106 (!) 104 (!) 109  Resp: 16 (!) 21 (!) 23 18  Temp:   98 F (36.7 C)   TempSrc:   Oral   SpO2: 96% 96% 95% 95%  Weight:      Height:        Isolation Precautions No active isolations  Medications Medications  sodium chloride 0.9 % bolus 1,000 mL (0 mLs Intravenous Stopped 07/12/23 1232)  ondansetron (ZOFRAN) injection 4 mg (4 mg Intravenous Given 07/12/23 1000)  famotidine (PEPCID) IVPB 20 mg premix (0 mg Intravenous Stopped 07/12/23 1232)  pantoprazole (PROTONIX) injection 40 mg (40 mg Intravenous Given 07/12/23 1245)  iohexol (OMNIPAQUE) 350 MG/ML injection 75 mL (75 mLs  Intravenous Contrast Given 07/12/23 1237)    Mobility walks with device     Focused Assessments Cardiac Assessment Handoff:  Cardiac Rhythm: Sinus tachycardia No results found for: "CKTOTAL", "CKMB", "CKMBINDEX", "TROPONINI" No results found for: "DDIMER" Does the Patient currently have chest pain? No    Cardiac Rhythm: Sinus tachycardia   , Pulmonary Assessment Handoff:  Lung sounds:   O2 Device: Room Air     R Recommendations: See Admitting Provider Note  Report given to:   Additional Notes:

## 2023-07-12 NOTE — Assessment & Plan Note (Signed)
Bp soft to normotensive Hold home norvasc and lopressor

## 2023-07-12 NOTE — Assessment & Plan Note (Signed)
No history of anemia. Post op hgb was 10.7 Today 8.7 with positive fecal occult Will check iron studies  Transfuse if hgb <7.0

## 2023-07-12 NOTE — Assessment & Plan Note (Signed)
Continue zoloft

## 2023-07-13 DIAGNOSIS — F419 Anxiety disorder, unspecified: Secondary | ICD-10-CM | POA: Diagnosis present

## 2023-07-13 DIAGNOSIS — D6832 Hemorrhagic disorder due to extrinsic circulating anticoagulants: Secondary | ICD-10-CM | POA: Diagnosis present

## 2023-07-13 DIAGNOSIS — E785 Hyperlipidemia, unspecified: Secondary | ICD-10-CM | POA: Diagnosis present

## 2023-07-13 DIAGNOSIS — K922 Gastrointestinal hemorrhage, unspecified: Secondary | ICD-10-CM | POA: Diagnosis present

## 2023-07-13 DIAGNOSIS — Z1152 Encounter for screening for COVID-19: Secondary | ICD-10-CM | POA: Diagnosis not present

## 2023-07-13 DIAGNOSIS — F418 Other specified anxiety disorders: Secondary | ICD-10-CM | POA: Diagnosis not present

## 2023-07-13 DIAGNOSIS — K254 Chronic or unspecified gastric ulcer with hemorrhage: Secondary | ICD-10-CM | POA: Diagnosis present

## 2023-07-13 DIAGNOSIS — Z85038 Personal history of other malignant neoplasm of large intestine: Secondary | ICD-10-CM | POA: Diagnosis not present

## 2023-07-13 DIAGNOSIS — K219 Gastro-esophageal reflux disease without esophagitis: Secondary | ICD-10-CM | POA: Diagnosis present

## 2023-07-13 DIAGNOSIS — K449 Diaphragmatic hernia without obstruction or gangrene: Secondary | ICD-10-CM | POA: Diagnosis present

## 2023-07-13 DIAGNOSIS — Z23 Encounter for immunization: Secondary | ICD-10-CM | POA: Diagnosis not present

## 2023-07-13 DIAGNOSIS — I10 Essential (primary) hypertension: Secondary | ICD-10-CM | POA: Diagnosis present

## 2023-07-13 DIAGNOSIS — Z961 Presence of intraocular lens: Secondary | ICD-10-CM | POA: Diagnosis present

## 2023-07-13 DIAGNOSIS — K317 Polyp of stomach and duodenum: Secondary | ICD-10-CM | POA: Diagnosis present

## 2023-07-13 DIAGNOSIS — Z66 Do not resuscitate: Secondary | ICD-10-CM | POA: Diagnosis present

## 2023-07-13 DIAGNOSIS — K222 Esophageal obstruction: Secondary | ICD-10-CM | POA: Diagnosis present

## 2023-07-13 DIAGNOSIS — Z9049 Acquired absence of other specified parts of digestive tract: Secondary | ICD-10-CM | POA: Diagnosis not present

## 2023-07-13 DIAGNOSIS — Z96641 Presence of right artificial hip joint: Secondary | ICD-10-CM | POA: Diagnosis present

## 2023-07-13 DIAGNOSIS — R911 Solitary pulmonary nodule: Secondary | ICD-10-CM | POA: Diagnosis present

## 2023-07-13 DIAGNOSIS — Z85820 Personal history of malignant melanoma of skin: Secondary | ICD-10-CM | POA: Diagnosis not present

## 2023-07-13 DIAGNOSIS — K838 Other specified diseases of biliary tract: Secondary | ICD-10-CM | POA: Diagnosis present

## 2023-07-13 DIAGNOSIS — K3189 Other diseases of stomach and duodenum: Secondary | ICD-10-CM | POA: Diagnosis present

## 2023-07-13 DIAGNOSIS — S72001A Fracture of unspecified part of neck of right femur, initial encounter for closed fracture: Secondary | ICD-10-CM | POA: Diagnosis present

## 2023-07-13 DIAGNOSIS — Z85828 Personal history of other malignant neoplasm of skin: Secondary | ICD-10-CM | POA: Diagnosis not present

## 2023-07-13 DIAGNOSIS — D62 Acute posthemorrhagic anemia: Secondary | ICD-10-CM | POA: Diagnosis present

## 2023-07-13 DIAGNOSIS — W19XXXA Unspecified fall, initial encounter: Secondary | ICD-10-CM | POA: Diagnosis present

## 2023-07-13 DIAGNOSIS — F32A Depression, unspecified: Secondary | ICD-10-CM | POA: Diagnosis present

## 2023-07-13 LAB — BASIC METABOLIC PANEL
Anion gap: 10 (ref 5–15)
BUN: 24 mg/dL — ABNORMAL HIGH (ref 8–23)
CO2: 24 mmol/L (ref 22–32)
Calcium: 8.6 mg/dL — ABNORMAL LOW (ref 8.9–10.3)
Chloride: 107 mmol/L (ref 98–111)
Creatinine, Ser: 0.49 mg/dL (ref 0.44–1.00)
GFR, Estimated: >60 mL/min (ref >60)
Glucose, Bld: 110 mg/dL — ABNORMAL HIGH (ref 70–99)
Potassium: 3.6 mmol/L (ref 3.5–5.1)
Sodium: 141 mmol/L (ref 135–145)

## 2023-07-13 LAB — CBC
HCT: 23.4 % — ABNORMAL LOW (ref 36.0–46.0)
HCT: 27.8 % — ABNORMAL LOW (ref 36.0–46.0)
Hemoglobin: 7.4 g/dL — ABNORMAL LOW (ref 12.0–15.0)
Hemoglobin: 9.1 g/dL — ABNORMAL LOW (ref 12.0–15.0)
MCH: 31.4 pg (ref 26.0–34.0)
MCH: 31.7 pg (ref 26.0–34.0)
MCHC: 31.6 g/dL (ref 30.0–36.0)
MCHC: 32.7 g/dL (ref 30.0–36.0)
MCV: 99.2 fL (ref 80.0–100.0)
MCV: 96.9 fL (ref 80.0–100.0)
Platelets: 422 10*3/uL — ABNORMAL HIGH (ref 150–400)
Platelets: 382 10*3/uL (ref 150–400)
RBC: 2.36 MIL/uL — ABNORMAL LOW (ref 3.87–5.11)
RBC: 2.87 MIL/uL — ABNORMAL LOW (ref 3.87–5.11)
RDW: 13.3 % (ref 11.5–15.5)
RDW: 15 % (ref 11.5–15.5)
WBC: 14.1 10*3/uL — ABNORMAL HIGH (ref 4.0–10.5)
WBC: 11.2 10*3/uL — ABNORMAL HIGH (ref 4.0–10.5)
nRBC: 0 % (ref 0.0–0.2)
nRBC: 0 % (ref 0.0–0.2)

## 2023-07-13 LAB — URINALYSIS, ROUTINE W REFLEX MICROSCOPIC
Bilirubin Urine: NEGATIVE
Glucose, UA: NEGATIVE mg/dL
Hgb urine dipstick: NEGATIVE
Ketones, ur: 20 mg/dL — AB
Leukocytes,Ua: NEGATIVE
Nitrite: NEGATIVE
Protein, ur: NEGATIVE mg/dL
Specific Gravity, Urine: 1.018 (ref 1.005–1.030)
pH: 5 (ref 5.0–8.0)

## 2023-07-13 LAB — PREPARE RBC (CROSSMATCH)

## 2023-07-13 MED ORDER — SODIUM CHLORIDE 0.9 % IV SOLN
INTRAVENOUS | Status: AC
  Administered 2023-07-13: 75 mL/h via INTRAVENOUS

## 2023-07-13 MED ORDER — SODIUM CHLORIDE 0.9% IV SOLUTION: Freq: Once | INTRAVENOUS | Status: AC

## 2023-07-13 MED ORDER — ORAL CARE MOUTH RINSE: 15.0000 mL | OROMUCOSAL | Status: DC | PRN

## 2023-07-13 NOTE — Progress Notes (Signed)
Notified by [employer] that patient had 15 runs of SVT. Patient denies any chest pain, palpitation, dizziness or shortness of breath. Notified Dr. Margo Aye and made new orders. EKG obtained.

## 2023-07-13 NOTE — Consult Note (Signed)
Referring Provider: [facility] Primary Care Physician:  Eloisa Northern, MD Primary Gastroenterologist:  Dr. Evette Cristal  Reason for Consultation: Nausea and vomiting with coffee-ground emesis  HPI: Frances Ortiz is a 89 y.o. 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  History of parietal scalp melanoma in 2015, history of GERD, and anxiety, recent hip replacement currently on Lovenox presented to the emergency department yesterday with nausea vomiting of 2 days duration with with blood in the vomiting yesterday.  GI is consulted for further evaluation.  Blood work this morning showed hemoglobin of 7.4.  Normal platelet counts.  And elevated BUN.  Hemoglobin of 10.7 on September 2.  Abdomen pelvis with IV contrast yesterday showed no acute finding but concerning for mild increasing biliary dilation up to 9 mm.  LFTs are normal.  Normal lipase.  Seen and examined at bedside.  She started having nausea and vomiting 2 to 3 days ago followed by dark-colored material in the vomiting yesterday.  Had 2 episodes of blood in the vomiting yesterday.  She has been having dark stool since taking iron supplements since last discharge.  Denies seeing any bright red blood per rectum.  Denies any abdominal pain.  Past Medical History:  Diagnosis Date   Anxiety    GERD (gastroesophageal reflux disease)    Heart murmur    HLD (hyperlipidemia)    Intestinal obstruction (HCC)    Osteoporosis    PONV (postoperative nausea and vomiting)    Skin cancer    melanoma, basal cell, and squamous    Past Surgical History:  Procedure Laterality Date   ANTERIOR APPROACH HEMI HIP ARTHROPLASTY Right 06/22/2023   Procedure: POSTERIOR APPROACH HEMI HIP ARTHROPLASTY;  Surgeon: Teryl Lucy, MD;  Location: [facility] OR;  Service: Orthopedics;  Laterality: Right;    BREAST BIOPSY     bilateral   BREAST EXCISIONAL BIOPSY Right 30 years ago   fibroadenoma   BREAST EXCISIONAL BIOPSY Left 40 years ago   sclerosis thickening   CATARACT EXTRACTION W/ INTRAOCULAR LENS  IMPLANT, BILATERAL Bilateral    CESAREAN SECTION     x 2   CHOLECYSTECTOMY N/A 11/27/2012   Procedure: LAPAROSCOPIC CHOLECYSTECTOMY WITH INTRAOPERATIVE CHOLANGIOGRAM;  Surgeon: Robyne Askew, MD;  Location: [facility] OR;  Service: General;  Laterality: N/A;   COLECTOMY Right 10/07/2017   right   COLONOSCOPY  2005   COLONOSCOPY  08/2017   "found mass in cecum"   DILATION AND CURETTAGE OF UTERUS     ESOPHAGOGASTRODUODENOSCOPY     IR KYPHO EA ADDL LEVEL THORACIC OR LUMBAR  02/27/2023   IR KYPHO LUMBAR INC FX REDUCE BONE BX UNI/BIL CANNULATION INC/IMAGING  02/27/2023   LAPAROSCOPIC RIGHT COLECTOMY Right 10/07/2017   Procedure: LAPAROSCOPIC RIGHT COLECTOMY ERAS PATHWAY;  Surgeon: Griselda Miner, MD;  Location: [facility] OR;  Service: General;  Laterality: Right;   SKIN CANCER EXCISION     TONSILLECTOMY      Prior to Admission medications   Medication Sig Start Date End Date Taking? Authorizing Provider  acetaminophen (TYLENOL) 650 MG CR tablet Take 650 mg by mouth 3 (three) times daily as needed for pain.   Yes [provider]  amLODipine (NORVASC) 5 MG tablet Take 1 tablet (5 mg total) by mouth daily. 06/25/23  Yes Leroy Sea, MD  bisacodyl (DULCOLAX) 10 MG suppository Place  1 suppository (10 mg total) rectally daily as needed for moderate constipation. 06/25/23  Yes Leroy Sea, MD  docusate sodium (COLACE) 100 MG capsule Take 1 capsule (100 mg total) by mouth 2 (two) times daily. 06/25/23  Yes Leroy Sea, MD  enoxaparin (LOVENOX) 40 MG/0.4ML injection Inject 0.4 mLs (40 mg total) into the skin daily for 30 doses. For 30 days post op for DVT prophylaxis 06/23/23 07/23/23 Yes Cecil Cobbs, PA-C  ferrous sulfate 325 (65 FE) MG tablet Take 1 tablet (325 mg total) by mouth daily with  breakfast. 06/25/23  Yes Leroy Sea, MD  Metamucil Fiber CHEW Chew 3 tablets by mouth daily.   Yes [provider]  metoprolol tartrate (LOPRESSOR) 50 MG tablet Take 1 tablet (50 mg total) by mouth 2 (two) times daily. 06/25/23  Yes Leroy Sea, MD  polyethylene glycol (MIRALAX / GLYCOLAX) 17 g packet Take 17 g by mouth daily as needed for mild constipation. 06/25/23  Yes Leroy Sea, MD  sertraline (ZOLOFT) 50 MG tablet Take 50 mg by mouth daily.   Yes [provider]  traMADol (ULTRAM) 50 MG tablet Take 1 tablet (50 mg total) by mouth every 8 (eight) hours as needed for moderate pain. 06/23/23  Yes Cecil Cobbs, PA-C    Scheduled Meds:  docusate sodium  100 mg Oral BID   ferrous sulfate  325 mg Oral Q breakfast   influenza vaccine adjuvanted  0.5 mL Intramuscular Tomorrow-1000   [START ON 07/16/2023] pantoprazole  40 mg Intravenous Q12H   psyllium  1 packet Oral Daily   sertraline  50 mg Oral Daily   Continuous Infusions:  sodium chloride 75 mL/hr at 07/13/23 0045   pantoprazole 8 mg/hr (07/13/23 0246)   PRN Meds:.acetaminophen **OR** acetaminophen, ondansetron **OR** ondansetron (ZOFRAN) IV, mouth rinse, polyethylene glycol, traMADol  Allergies as of 07/12/2023 - Review Complete 07/12/2023  Allergen Reaction Noted   Aristocort [triamcinolone] Other (See Comments) 10/31/2012   Cortisone Other (See Comments) 10/31/2012   Erythromycin Other (See Comments) 10/31/2012   Other Other (See Comments) 11/19/2012   Hydrocodone-acetaminophen Rash 12/15/2012    Family History  Problem Relation Age of Onset   Pneumonia Mother    Colon cancer Neg Hx    Colon polyps Neg Hx    Rectal cancer Neg Hx    Stomach cancer Neg Hx     Social History   Socioeconomic History   Marital status: Divorced    Spouse name: Not on file   Number of children: 2   Years of education: Not on file   Highest education level: Not on file  Occupational History    Occupation: retired  Tobacco Use   Smoking status: Never   Smokeless tobacco: Never  Vaping Use   Vaping status: Never Used  Substance and Sexual Activity   Alcohol use: No   Drug use: No   Sexual activity: Not on file  Other Topics Concern   Not on file  Social History Narrative   Not on file   Social Determinants of Health   Financial Resource Strain: Not on file  Food Insecurity: No Food Insecurity (07/12/2023)   Hunger Vital Sign    Worried About Running Out of Food in the Last Year: Never true    Ran Out of Food in the Last Year: Never true  Transportation Needs: No Transportation Needs (07/12/2023)   PRAPARE - Administrator, Civil Service (Medical): No  Lack of Transportation (Non-Medical): No  Physical Activity: Not on file  Stress: Not on file  Social Connections: Not on file  Intimate Partner Violence: Not At Risk (07/12/2023)   Humiliation, Afraid, Rape, and Kick questionnaire    Fear of Current or Ex-Partner: No    Emotionally Abused: No    Physically Abused: No    Sexually Abused: No    Review of Systems: All negative except as stated above in HPI.  Physical Exam: Vital signs: Vitals:   07/13/23 0750 07/13/23 1154  BP: (!) 130/49 132/61  Pulse: 95   Resp: 18 18  Temp: 98.1 F (36.7 C) 98.5 F (36.9 C)  SpO2: 100%    Last BM Date : 07/10/23 General:   Elderly patient, not in acute distress Lungs: No visible respiratory distress Heart:  Regular rate and rhythm; no murmurs, clicks, rubs,  or gallops. Abdomen: Soft, nontender, nondistended, bowel sound present, no peritoneal signs Mood and affect normal Alert and oriented x 3 Rectal:  Deferred  GI:  Lab Results: Recent Labs    07/12/23 1916 07/13/23 0047 07/13/23 0649  WBC 12.3* 14.1* 11.2*  HGB 7.3* 7.4* 9.1*  HCT 23.6* 23.4* 27.8*  PLT 407* 422* 382   BMET Recent Labs    07/12/23 0955 07/13/23 0649  NA 141 141  K 3.9 3.6  CL 103 107  CO2 27 24  GLUCOSE 125* 110*   BUN 42* 24*  CREATININE 0.60 0.49  CALCIUM 9.0 8.6*   LFT Recent Labs    07/12/23 0955  PROT 6.3*  ALBUMIN 3.2*  AST 18  ALT 12  ALKPHOS 75  BILITOT 0.5   PT/INR No results for input(s): "LABPROT", "INR" in the last 72 hours.   Studies/Results: CT Angio Chest PE W and/or Wo Contrast  Result Date: 07/12/2023 CLINICAL DATA:  Nausea and vomiting EXAM: CT ANGIOGRAPHY CHEST CT ABDOMEN AND PELVIS WITH CONTRAST TECHNIQUE: Multidetector CT imaging of the chest was performed using the standard protocol during bolus administration of intravenous contrast. Multiplanar CT image reconstructions and MIPs were obtained to evaluate the vascular anatomy. Multidetector CT imaging of the abdomen and pelvis was performed using the standard protocol during bolus administration of intravenous contrast. RADIATION DOSE REDUCTION: This exam was performed according to the departmental dose-optimization program which includes automated exposure control, adjustment of the mA and/or kV according to patient size and/or use of iterative reconstruction technique. CONTRAST:  75mL OMNIPAQUE IOHEXOL 350 MG/ML SOLN COMPARISON:  None Available. FINDINGS: CTA CHEST FINDINGS Cardiovascular: No evidence of pulmonary embolus. Normal heart size. No pericardial effusion. Normal caliber thoracic aorta with mild atherosclerotic disease. Severe coronary artery calcifications. Mildly dilated main pulmonary artery, measuring up to 3.2 cm. Mediastinum/Nodes: Small hiatal hernia. Thyroid is unremarkable. No enlarged lymph nodes seen in the chest. Lungs/Pleura: Central airways are patent. Bilateral mosaic attenuation. Solid pulmonary nodule of the posterior right upper lobe measuring 6 mm on series 4, image 34. Musculoskeletal: No chest wall abnormality. No acute or significant osseous findings. Review of the MIP images confirms the above findings. CT ABDOMEN and PELVIS FINDINGS Hepatobiliary: No suspicious liver lesion. Prior  cholecystectomy. Slightly increased common bile duct dilation, measuring up to 9 mm, previously 7 mm. New mild intrahepatic biliary ductal dilation. Pancreas: Unremarkable. No pancreatic ductal dilatation or surrounding inflammatory changes. Spleen: Normal in size without focal abnormality. Adrenals/Urinary Tract: Unchanged right adrenal gland adenoma measuring up to 1.6 cm, consistent with benign adenoma given long-term stability, no specific follow-up imaging is necessary. Bilateral  left-greater-than-right parapelvic cysts. Additional bilateral low-attenuation renal lesions, largest are consistent with simple cysts, others are too small to accurately characterize, no specific follow-up imaging is necessary. Punctate left-greater-than-right nonobstructing renal stones. Bladder is unremarkable. Stomach/Bowel: Stomach is within normal limits. Small duodenal diverticulum. Prior ileocecectomy. Diverticulosis. No evidence of bowel wall thickening, distention, or inflammatory changes. Vascular/Lymphatic: Mild aortic atherosclerosis. No enlarged abdominal or pelvic lymph nodes. Reproductive: Limits evaluation due to streak artifact. Uterus is unremarkable. Fluid noted in the vaginal cuff. Other: No abdominal wall hernia or abnormality. No abdominopelvic ascites. Musculoskeletal: Prior right hip replacement. Multilevel compression deformities of the lumbar spine with kyphoplasty changes at L2-L3. Unchanged mild anterolisthesis of L4 on L5. Review of the MIP images confirms the above findings. IMPRESSION: Chest CTA: 1. No evidence of pulmonary embolus. 2. Bilateral mosaic attenuation, which can be seen in the setting of small airways disease or pulmonary hypertension. 3. Solid pulmonary nodule of the posterior right upper lobe measuring 6 mm. Non-contrast chest CT at 6-12 months is recommended. If the nodule is stable at time of repeat CT, then future CT at 18-24 months (from today's scan) is considered optional for  low-risk patients, but is recommended for high-risk patients. This recommendation follows the consensus statement: Guidelines for Management of Incidental Pulmonary Nodules Detected on CT Images: From the Fleischner Society 2017; Radiology 2017; 284:228-243. 4. Coronary artery calcifications Abdomen and pelvis CT: 1. Slightly increased common bile duct dilation, measuring up to 9 mm, previously 7 mm. New mild intrahepatic biliary ductal dilation. Correlate with liver function tests. If LFTs are abnormal, consider further evaluation with MRCP. 2. Aortic Atherosclerosis (ICD10-I70.0). Electronically Signed   By: Allegra Lai M.D.   On: 07/12/2023 13:18   CT ABDOMEN PELVIS W CONTRAST  Result Date: 07/12/2023 CLINICAL DATA:  Nausea and vomiting EXAM: CT ANGIOGRAPHY CHEST CT ABDOMEN AND PELVIS WITH CONTRAST TECHNIQUE: Multidetector CT imaging of the chest was performed using the standard protocol during bolus administration of intravenous contrast. Multiplanar CT image reconstructions and MIPs were obtained to evaluate the vascular anatomy. Multidetector CT imaging of the abdomen and pelvis was performed using the standard protocol during bolus administration of intravenous contrast. RADIATION DOSE REDUCTION: This exam was performed according to the departmental dose-optimization program which includes automated exposure control, adjustment of the mA and/or kV according to patient size and/or use of iterative reconstruction technique. CONTRAST:  75mL OMNIPAQUE IOHEXOL 350 MG/ML SOLN COMPARISON:  None Available. FINDINGS: CTA CHEST FINDINGS Cardiovascular: No evidence of pulmonary embolus. Normal heart size. No pericardial effusion. Normal caliber thoracic aorta with mild atherosclerotic disease. Severe coronary artery calcifications. Mildly dilated main pulmonary artery, measuring up to 3.2 cm. Mediastinum/Nodes: Small hiatal hernia. Thyroid is unremarkable. No enlarged lymph nodes seen in the chest.  Lungs/Pleura: Central airways are patent. Bilateral mosaic attenuation. Solid pulmonary nodule of the posterior right upper lobe measuring 6 mm on series 4, image 34. Musculoskeletal: No chest wall abnormality. No acute or significant osseous findings. Review of the MIP images confirms the above findings. CT ABDOMEN and PELVIS FINDINGS Hepatobiliary: No suspicious liver lesion. Prior cholecystectomy. Slightly increased common bile duct dilation, measuring up to 9 mm, previously 7 mm. New mild intrahepatic biliary ductal dilation. Pancreas: Unremarkable. No pancreatic ductal dilatation or surrounding inflammatory changes. Spleen: Normal in size without focal abnormality. Adrenals/Urinary Tract: Unchanged right adrenal gland adenoma measuring up to 1.6 cm, consistent with benign adenoma given long-term stability, no specific follow-up imaging is necessary. Bilateral left-greater-than-right parapelvic cysts. Additional bilateral low-attenuation renal lesions,  largest are consistent with simple cysts, others are too small to accurately characterize, no specific follow-up imaging is necessary. Punctate left-greater-than-right nonobstructing renal stones. Bladder is unremarkable. Stomach/Bowel: Stomach is within normal limits. Small duodenal diverticulum. Prior ileocecectomy. Diverticulosis. No evidence of bowel wall thickening, distention, or inflammatory changes. Vascular/Lymphatic: Mild aortic atherosclerosis. No enlarged abdominal or pelvic lymph nodes. Reproductive: Limits evaluation due to streak artifact. Uterus is unremarkable. Fluid noted in the vaginal cuff. Other: No abdominal wall hernia or abnormality. No abdominopelvic ascites. Musculoskeletal: Prior right hip replacement. Multilevel compression deformities of the lumbar spine with kyphoplasty changes at L2-L3. Unchanged mild anterolisthesis of L4 on L5. Review of the MIP images confirms the above findings. IMPRESSION: Chest CTA: 1. No evidence of pulmonary  embolus. 2. Bilateral mosaic attenuation, which can be seen in the setting of small airways disease or pulmonary hypertension. 3. Solid pulmonary nodule of the posterior right upper lobe measuring 6 mm. Non-contrast chest CT at 6-12 months is recommended. If the nodule is stable at time of repeat CT, then future CT at 18-24 months (from today's scan) is considered optional for low-risk patients, but is recommended for high-risk patients. This recommendation follows the consensus statement: Guidelines for Management of Incidental Pulmonary Nodules Detected on CT Images: From the Fleischner Society 2017; Radiology 2017; 284:228-243. 4. Coronary artery calcifications Abdomen and pelvis CT: 1. Slightly increased common bile duct dilation, measuring up to 9 mm, previously 7 mm. New mild intrahepatic biliary ductal dilation. Correlate with liver function tests. If LFTs are abnormal, consider further evaluation with MRCP. 2. Aortic Atherosclerosis (ICD10-I70.0). Electronically Signed   By: Allegra Lai M.D.   On: 07/12/2023 13:18    Impression/Plan: -Coffee-ground emesis in a patient with recent nausea and vomiting while on Lovenox.  Likely Mallory-Weiss tear versus gastritis. -Acute blood loss anemia.  Status post blood transfusion. -Dark stools -Recent hip replacement was on Lovenox. -Mild biliary dilation of CBD up to 9 mm.  Normal LFTs.  Normal lipase.  No abdominal pain today   Recommendations ------------------------- -Tentative plan for EGD tomorrow.  Continue to hold Lovenox for now.  Okay to have full liquid diet today.  Keep n.p.o. past midnight. -Recommend repeating ultrasound right upper quadrant in 3 months for follow-up on biliary dilation. -Continue Protonix drip.  Risks (bleeding, infection, bowel perforation that could require surgery, sedation-related changes in cardiopulmonary systems), benefits (identification and possible treatment of source of symptoms, exclusion of certain causes  of symptoms), and alternatives (watchful waiting, radiographic imaging studies, empiric medical treatment)  were explained to patient/family in detail and patient wishes to proceed.     LOS: 0 days   Kathi Der  MD, FACP 07/13/2023, 1:03 PM  Contact #  [phone]

## 2023-07-13 NOTE — Plan of Care (Signed)
  Problem: Pain Management: Goal: Pain level will decrease Outcome: Progressing   Problem: Clinical Measurements: Goal: Will remain free from infection Outcome: Progressing   Problem: Activity: Goal: Risk for activity intolerance will decrease Outcome: Progressing   Problem: Nutrition: Goal: Adequate nutrition will be maintained Outcome: Progressing

## 2023-07-13 NOTE — Plan of Care (Signed)
Problem: Clinical Measurements: Goal: Respiratory complications will improve Outcome: Progressing Goal: Cardiovascular complication will be avoided Outcome: Progressing   Problem: Elimination: Goal: Will not experience complications related to urinary retention Outcome: Progressing   Problem: Safety: Goal: Ability to remain free from injury will improve Outcome: Progressing   Problem: Skin Integrity: Goal: Risk for impaired skin integrity will decrease Outcome: Progressing

## 2023-07-13 NOTE — Care Management Obs Status (Signed)
MEDICARE OBSERVATION STATUS NOTIFICATION   Patient Details  Name: Frances Ortiz MRN: [id number] Date of Birth: 07-02-1931   Medicare Observation Status Notification Given:  Yes    Ronny Bacon, RN 07/13/2023, 11:08 AM

## 2023-07-13 NOTE — Progress Notes (Signed)
Pt had another episode of 13 runs of SVT. Pt denies any chest pain, SOB, palpitation or dizziness. BP 115/53. Notified Dr. Margo Aye.

## 2023-07-13 NOTE — Progress Notes (Signed)
Notified [name], daughter of plans for EGD in the am.

## 2023-07-13 NOTE — Progress Notes (Signed)
PROGRESS NOTE    Frances Ortiz  YQM:[id number] DOB: July 14, 1931 DOA: 07/12/2023 PCP: Eloisa Northern, MD   Brief Narrative:  Frances Ortiz is a 89 y.o. female with medical history significant of   hyperlipidemia, HTN, GERD, anxiety/depression, cecal cancer status post distant right Hemicolectomy presented to the ED with complaints of nausea and vomiting with notable hematemesis and questionably dark stool.  Of note patient had recent hip replacement and has been on Lovenox for DVT prophylaxis.  Hospitalist called for admit, GI called in consult.  Assessment & Plan:   Principal Problem:   Upper GI bleed Active Problems:   Pulmonary nodule   Acute blood loss anemia   Essential hypertension   Cecal cancer (HCC)   Hip fracture (HCC)   Anxiety and depression  Questionable upper GI bleed Acute symptomatic anemia secondary to blood loss, presumed -No further episodes of hematemesis or hematochezia/melena -GI consulted, appreciate insight recommendations, tentative EGD planned 9/22 -Tolerated previous transfusion quite well, hemoglobin within normal limits   Pulmonary nodule, incidental Right upper lobe recommended repeat non contrast CT chest at 6-12 months.    Essential hypertension Hold amlodipine, metoprolol   Cecal cancer (HCC), history of, distant Cecal cancer s/p right colectomy in December of 2019.  Uneventful follow-up   Hip fracture (HCC), recent, stable Right hip fracture - ORIF by Dr. Dion Saucier on 06/22/23 Has been in rehab at SNF on lovenox for VTE prophylaxis x 30 days  Hold lovenox given above   Anxiety and depression Continue zoloft   DVT prophylaxis: SCDs Start: 07/12/23 1603   Code Status:   Code Status: Limited: Do not attempt resuscitation (DNR) -DNR-LIMITED -Do Not Intubate/DNI   Family Communication: At bedside  Status is: Inpatient  Dispo: The patient is from: Facility              Anticipated d/c is to: Same              Anticipated d/c date is: 48  to 72 hours pending clinical course and need for further procedure              Patient currently not medically stable for discharge  Consultants:  GI  Procedures:  Tentative endoscopy 9/22  Antimicrobials:  None indicated  Subjective: No acute issues or events overnight patient denies nausea vomiting diarrhea constipation headache fevers chills chest pain.  She denies any further blood or coffee-ground emesis, no further dark stools or diarrhea.  Objective: Vitals:   07/13/23 0258 07/13/23 0301 07/13/23 0419 07/13/23 0750  BP: (!) 115/57 (!) 115/57  (!) 130/49  Pulse: (!) 103 (!) 102  95  Resp: 18 18 18 18   Temp: 98 F (36.7 C) 98 F (36.7 C) 98.1 F (36.7 C) 98.1 F (36.7 C)  TempSrc: Oral Oral Oral Oral  SpO2: 100% 100%  100%  Weight:      Height:        Intake/Output Summary (Last 24 hours) at 07/13/2023 0829 Last data filed at 07/13/2023 0509 Gross per 24 hour  Intake 1915.52 ml  Output 300 ml  Net 1615.52 ml   Filed Weights   07/12/23 0940  Weight: 55.3 kg    Examination:  General exam: Appears calm and comfortable  Respiratory system: Clear to auscultation. Respiratory effort normal. Cardiovascular system: S1 & S2 heard, RRR. No JVD, murmurs, rubs, gallops or clicks. No pedal edema. Gastrointestinal system: Abdomen is nondistended, soft and nontender. No organomegaly or masses felt. Normal bowel sounds heard. Central nervous  system: Alert and oriented. No focal neurological deficits. Extremities: Symmetric 5 x 5 power. Skin: No rashes, lesions or ulcers Psychiatry: Judgement and insight appear normal. Mood & affect appropriate.     Data Reviewed: I have personally reviewed following labs and imaging studies  CBC: Recent Labs  Lab 07/12/23 0955 07/12/23 1916 07/13/23 0047 07/13/23 0649  WBC 12.9* 12.3* 14.1* 11.2*  NEUTROABS 10.4*  --   --   --   HGB 8.7* 7.3* 7.4* 9.1*  HCT 27.9* 23.6* 23.4* 27.8*  MCV 98.2 99.2 99.2 96.9  PLT 484* 407* 422*  382   Basic Metabolic Panel: Recent Labs  Lab 07/12/23 0955 07/13/23 0649  NA 141 141  K 3.9 3.6  CL 103 107  CO2 27 24  GLUCOSE 125* 110*  BUN 42* 24*  CREATININE 0.60 0.49  CALCIUM 9.0 8.6*   GFR: Estimated Creatinine Clearance: 34.5 mL/min (by C-G formula based on SCr of 0.49 mg/dL). Liver Function Tests: Recent Labs  Lab 07/12/23 0955  AST 18  ALT 12  ALKPHOS 75  BILITOT 0.5  PROT 6.3*  ALBUMIN 3.2*   Recent Labs  Lab 07/12/23 0955  LIPASE 28   No results for input(s): "AMMONIA" in the last 168 hours. Coagulation Profile: No results for input(s): "INR", "PROTIME" in the last 168 hours. Cardiac Enzymes: No results for input(s): "CKTOTAL", "CKMB", "CKMBINDEX", "TROPONINI" in the last 168 hours. BNP (last 3 results) No results for input(s): "PROBNP" in the last 8760 hours. HbA1C: No results for input(s): "HGBA1C" in the last 72 hours. CBG: No results for input(s): "GLUCAP" in the last 168 hours. Lipid Profile: No results for input(s): "CHOL", "HDL", "LDLCALC", "TRIG", "CHOLHDL", "LDLDIRECT" in the last 72 hours. Thyroid Function Tests: No results for input(s): "TSH", "T4TOTAL", "FREET4", "T3FREE", "THYROIDAB" in the last 72 hours. Anemia Panel: Recent Labs    07/12/23 1619  FERRITIN 115  TIBC 288  IRON 27*   Sepsis Labs: No results for input(s): "PROCALCITON", "LATICACIDVEN" in the last 168 hours.  Recent Results (from the past 240 hour(s))  Resp panel by RT-PCR (RSV, Flu A&B, Covid) Anterior Nasal Swab     Status: None   Collection Time: 07/12/23 10:03 AM   Specimen: Anterior Nasal Swab  Result Value Ref Range Status   SARS Coronavirus 2 by RT PCR NEGATIVE NEGATIVE Final   Influenza A by PCR NEGATIVE NEGATIVE Final   Influenza B by PCR NEGATIVE NEGATIVE Final    Comment: (NOTE) The Xpert Xpress SARS-CoV-2/FLU/RSV plus assay is intended as an aid in the diagnosis of influenza from Nasopharyngeal swab specimens and should not be used as a sole  basis for treatment. Nasal washings and aspirates are unacceptable for Xpert Xpress SARS-CoV-2/FLU/RSV testing.  Fact Sheet for Patients: BloggerCourse.com  Fact Sheet for Healthcare Providers: SeriousBroker.it  This test is not yet approved or cleared by the [place] FDA and has been authorized for detection and/or diagnosis of SARS-CoV-2 by FDA under an Emergency Use Authorization (EUA). This EUA will remain in effect (meaning this test can be used) for the duration of the COVID-19 declaration under Section 564(b)(1) of the Act, 21 U.S.C. section 360bbb-3(b)(1), unless the authorization is terminated or revoked.     Resp Syncytial Virus by PCR NEGATIVE NEGATIVE Final    Comment: (NOTE) Fact Sheet for Patients: BloggerCourse.com  Fact Sheet for Healthcare Providers: SeriousBroker.it  This test is not yet approved or cleared by the [place] FDA and has been authorized for detection and/or diagnosis of  SARS-CoV-2 by FDA under an Emergency Use Authorization (EUA). This EUA will remain in effect (meaning this test can be used) for the duration of the COVID-19 declaration under Section 564(b)(1) of the Act, 21 U.S.C. section 360bbb-3(b)(1), unless the authorization is terminated or revoked.  Performed at [facility], [address] [place], [place] [address]          Radiology Studies: CT Angio Chest PE W and/or Wo Contrast  Result Date: 07/12/2023 CLINICAL DATA:  Nausea and vomiting EXAM: CT ANGIOGRAPHY CHEST CT ABDOMEN AND PELVIS WITH CONTRAST TECHNIQUE: Multidetector CT imaging of the chest was performed using the standard protocol during bolus administration of intravenous contrast. Multiplanar CT image reconstructions and MIPs were obtained to evaluate the vascular anatomy. Multidetector CT imaging of the abdomen and pelvis was performed using the  standard protocol during bolus administration of intravenous contrast. RADIATION DOSE REDUCTION: This exam was performed according to the departmental dose-optimization program which includes automated exposure control, adjustment of the mA and/or kV according to patient size and/or use of iterative reconstruction technique. CONTRAST:  75mL OMNIPAQUE IOHEXOL 350 MG/ML SOLN COMPARISON:  None Available. FINDINGS: CTA CHEST FINDINGS Cardiovascular: No evidence of pulmonary embolus. Normal heart size. No pericardial effusion. Normal caliber thoracic aorta with mild atherosclerotic disease. Severe coronary artery calcifications. Mildly dilated main pulmonary artery, measuring up to 3.2 cm. Mediastinum/Nodes: Small hiatal hernia. Thyroid is unremarkable. No enlarged lymph nodes seen in the chest. Lungs/Pleura: Central airways are patent. Bilateral mosaic attenuation. Solid pulmonary nodule of the posterior right upper lobe measuring 6 mm on series 4, image 34. Musculoskeletal: No chest wall abnormality. No acute or significant osseous findings. Review of the MIP images confirms the above findings. CT ABDOMEN and PELVIS FINDINGS Hepatobiliary: No suspicious liver lesion. Prior cholecystectomy. Slightly increased common bile duct dilation, measuring up to 9 mm, previously 7 mm. New mild intrahepatic biliary ductal dilation. Pancreas: Unremarkable. No pancreatic ductal dilatation or surrounding inflammatory changes. Spleen: Normal in size without focal abnormality. Adrenals/Urinary Tract: Unchanged right adrenal gland adenoma measuring up to 1.6 cm, consistent with benign adenoma given long-term stability, no specific follow-up imaging is necessary. Bilateral left-greater-than-right parapelvic cysts. Additional bilateral low-attenuation renal lesions, largest are consistent with simple cysts, others are too small to accurately characterize, no specific follow-up imaging is necessary. Punctate left-greater-than-right  nonobstructing renal stones. Bladder is unremarkable. Stomach/Bowel: Stomach is within normal limits. Small duodenal diverticulum. Prior ileocecectomy. Diverticulosis. No evidence of bowel wall thickening, distention, or inflammatory changes. Vascular/Lymphatic: Mild aortic atherosclerosis. No enlarged abdominal or pelvic lymph nodes. Reproductive: Limits evaluation due to streak artifact. Uterus is unremarkable. Fluid noted in the vaginal cuff. Other: No abdominal wall hernia or abnormality. No abdominopelvic ascites. Musculoskeletal: Prior right hip replacement. Multilevel compression deformities of the lumbar spine with kyphoplasty changes at L2-L3. Unchanged mild anterolisthesis of L4 on L5. Review of the MIP images confirms the above findings. IMPRESSION: Chest CTA: 1. No evidence of pulmonary embolus. 2. Bilateral mosaic attenuation, which can be seen in the setting of small airways disease or pulmonary hypertension. 3. Solid pulmonary nodule of the posterior right upper lobe measuring 6 mm. Non-contrast chest CT at 6-12 months is recommended. If the nodule is stable at time of repeat CT, then future CT at 18-24 months (from today's scan) is considered optional for low-risk patients, but is recommended for high-risk patients. This recommendation follows the consensus statement: Guidelines for Management of Incidental Pulmonary Nodules Detected on CT Images: From the Fleischner Society 2017; Radiology  2017; 696:295-284. 4. Coronary artery calcifications Abdomen and pelvis CT: 1. Slightly increased common bile duct dilation, measuring up to 9 mm, previously 7 mm. New mild intrahepatic biliary ductal dilation. Correlate with liver function tests. If LFTs are abnormal, consider further evaluation with MRCP. 2. Aortic Atherosclerosis (ICD10-I70.0). Electronically Signed   By: Allegra Lai M.D.   On: 07/12/2023 13:18   CT ABDOMEN PELVIS W CONTRAST  Result Date: 07/12/2023 CLINICAL DATA:  Nausea and vomiting  EXAM: CT ANGIOGRAPHY CHEST CT ABDOMEN AND PELVIS WITH CONTRAST TECHNIQUE: Multidetector CT imaging of the chest was performed using the standard protocol during bolus administration of intravenous contrast. Multiplanar CT image reconstructions and MIPs were obtained to evaluate the vascular anatomy. Multidetector CT imaging of the abdomen and pelvis was performed using the standard protocol during bolus administration of intravenous contrast. RADIATION DOSE REDUCTION: This exam was performed according to the departmental dose-optimization program which includes automated exposure control, adjustment of the mA and/or kV according to patient size and/or use of iterative reconstruction technique. CONTRAST:  75mL OMNIPAQUE IOHEXOL 350 MG/ML SOLN COMPARISON:  None Available. FINDINGS: CTA CHEST FINDINGS Cardiovascular: No evidence of pulmonary embolus. Normal heart size. No pericardial effusion. Normal caliber thoracic aorta with mild atherosclerotic disease. Severe coronary artery calcifications. Mildly dilated main pulmonary artery, measuring up to 3.2 cm. Mediastinum/Nodes: Small hiatal hernia. Thyroid is unremarkable. No enlarged lymph nodes seen in the chest. Lungs/Pleura: Central airways are patent. Bilateral mosaic attenuation. Solid pulmonary nodule of the posterior right upper lobe measuring 6 mm on series 4, image 34. Musculoskeletal: No chest wall abnormality. No acute or significant osseous findings. Review of the MIP images confirms the above findings. CT ABDOMEN and PELVIS FINDINGS Hepatobiliary: No suspicious liver lesion. Prior cholecystectomy. Slightly increased common bile duct dilation, measuring up to 9 mm, previously 7 mm. New mild intrahepatic biliary ductal dilation. Pancreas: Unremarkable. No pancreatic ductal dilatation or surrounding inflammatory changes. Spleen: Normal in size without focal abnormality. Adrenals/Urinary Tract: Unchanged right adrenal gland adenoma measuring up to 1.6 cm,  consistent with benign adenoma given long-term stability, no specific follow-up imaging is necessary. Bilateral left-greater-than-right parapelvic cysts. Additional bilateral low-attenuation renal lesions, largest are consistent with simple cysts, others are too small to accurately characterize, no specific follow-up imaging is necessary. Punctate left-greater-than-right nonobstructing renal stones. Bladder is unremarkable. Stomach/Bowel: Stomach is within normal limits. Small duodenal diverticulum. Prior ileocecectomy. Diverticulosis. No evidence of bowel wall thickening, distention, or inflammatory changes. Vascular/Lymphatic: Mild aortic atherosclerosis. No enlarged abdominal or pelvic lymph nodes. Reproductive: Limits evaluation due to streak artifact. Uterus is unremarkable. Fluid noted in the vaginal cuff. Other: No abdominal wall hernia or abnormality. No abdominopelvic ascites. Musculoskeletal: Prior right hip replacement. Multilevel compression deformities of the lumbar spine with kyphoplasty changes at L2-L3. Unchanged mild anterolisthesis of L4 on L5. Review of the MIP images confirms the above findings. IMPRESSION: Chest CTA: 1. No evidence of pulmonary embolus. 2. Bilateral mosaic attenuation, which can be seen in the setting of small airways disease or pulmonary hypertension. 3. Solid pulmonary nodule of the posterior right upper lobe measuring 6 mm. Non-contrast chest CT at 6-12 months is recommended. If the nodule is stable at time of repeat CT, then future CT at 18-24 months (from today's scan) is considered optional for low-risk patients, but is recommended for high-risk patients. This recommendation follows the consensus statement: Guidelines for Management of Incidental Pulmonary Nodules Detected on CT Images: From the Fleischner Society 2017; Radiology 2017; 284:228-243. 4. Coronary artery calcifications Abdomen and  pelvis CT: 1. Slightly increased common bile duct dilation, measuring up to 9  mm, previously 7 mm. New mild intrahepatic biliary ductal dilation. Correlate with liver function tests. If LFTs are abnormal, consider further evaluation with MRCP. 2. Aortic Atherosclerosis (ICD10-I70.0). Electronically Signed   By: Allegra Lai M.D.   On: 07/12/2023 13:18    Scheduled Meds:  docusate sodium  100 mg Oral BID   ferrous sulfate  325 mg Oral Q breakfast   influenza vaccine adjuvanted  0.5 mL Intramuscular Tomorrow-1000   [START ON 07/16/2023] pantoprazole  40 mg Intravenous Q12H   psyllium  1 packet Oral Daily   sertraline  50 mg Oral Daily   Continuous Infusions:  sodium chloride 75 mL/hr at 07/13/23 0045   pantoprazole 8 mg/hr (07/13/23 0246)     LOS: 0 days   Time spent:  Azucena Fallen, DO [facility]  If 7PM-7AM, please contact night-coverage [email or web address]  07/13/2023, 8:29 AM

## 2023-07-14 ENCOUNTER — Inpatient Hospital Stay (HOSPITAL_COMMUNITY): Payer: Medicare Other | Admitting: Anesthesiology

## 2023-07-14 ENCOUNTER — Encounter (HOSPITAL_COMMUNITY)
Admission: EM | Disposition: A | Discharge status: Skilled Nursing Facility | Payer: Self-pay | Source: Skilled Nursing Facility | Attending: Internal Medicine

## 2023-07-14 ENCOUNTER — Encounter (HOSPITAL_COMMUNITY): Payer: Self-pay | Admitting: Gastroenterology

## 2023-07-14 DIAGNOSIS — I10 Essential (primary) hypertension: Secondary | ICD-10-CM | POA: Diagnosis not present

## 2023-07-14 DIAGNOSIS — F418 Other specified anxiety disorders: Secondary | ICD-10-CM

## 2023-07-14 DIAGNOSIS — K317 Polyp of stomach and duodenum: Secondary | ICD-10-CM | POA: Diagnosis not present

## 2023-07-14 DIAGNOSIS — K922 Gastrointestinal hemorrhage, unspecified: Secondary | ICD-10-CM | POA: Diagnosis not present

## 2023-07-14 HISTORY — PX: BIOPSY: SHX5522

## 2023-07-14 HISTORY — PX: ESOPHAGOGASTRODUODENOSCOPY (EGD) WITH PROPOFOL: SHX5813

## 2023-07-14 LAB — BPAM RBC
Blood Product Expiration Date: 202410142359
ISSUE DATE / TIME: 202409210234
Unit Type and Rh: 5100

## 2023-07-14 LAB — CBC
HCT: 25.4 % — ABNORMAL LOW (ref 36.0–46.0)
Hemoglobin: 8.1 g/dL — ABNORMAL LOW (ref 12.0–15.0)
MCH: 30.7 pg (ref 26.0–34.0)
MCHC: 31.9 g/dL (ref 30.0–36.0)
MCV: 96.2 fL (ref 80.0–100.0)
Platelets: 350 10*3/uL (ref 150–400)
RBC: 2.64 MIL/uL — ABNORMAL LOW (ref 3.87–5.11)
RDW: 15 % (ref 11.5–15.5)
WBC: 9.4 10*3/uL (ref 4.0–10.5)
nRBC: 0 % (ref 0.0–0.2)

## 2023-07-14 LAB — TYPE AND SCREEN
ABO/RH(D): O POS
Antibody Screen: NEGATIVE
Unit division: 0

## 2023-07-14 SURGERY — ESOPHAGOGASTRODUODENOSCOPY (EGD) WITH PROPOFOL
Anesthesia: Monitor Anesthesia Care

## 2023-07-14 MED ORDER — PHENYLEPHRINE HCL (PRESSORS) 10 MG/ML IV SOLN
INTRAVENOUS | Status: DC | PRN
Start: 2023-07-14 — End: 2023-07-14
  Administered 2023-07-14 (×4): 80 ug via INTRAVENOUS

## 2023-07-14 MED ORDER — PROPOFOL 500 MG/50ML IV EMUL
INTRAVENOUS | Status: DC | PRN
  Administered 2023-07-14: 100 ug/kg/min via INTRAVENOUS

## 2023-07-14 MED ORDER — ENOXAPARIN SODIUM 40 MG/0.4ML IJ SOSY
40.0000 mg | PREFILLED_SYRINGE | INTRAMUSCULAR | Status: DC
  Administered 2023-07-14 – 2023-07-16 (×3): 40 mg via SUBCUTANEOUS
  Filled 2023-07-14 (×3): qty 0.4

## 2023-07-14 MED ORDER — LACTATED RINGERS IV SOLN
INTRAVENOUS | Status: DC | PRN
Start: 2023-07-14 — End: 2023-07-14

## 2023-07-14 MED ORDER — LIDOCAINE 2% (20 MG/ML) 5 ML SYRINGE
INTRAMUSCULAR | Status: DC | PRN
  Administered 2023-07-14: 60 mg via INTRAVENOUS

## 2023-07-14 MED ORDER — PROPOFOL 10 MG/ML IV BOLUS
INTRAVENOUS | Status: DC | PRN
Start: 2023-07-14 — End: 2023-07-14
  Administered 2023-07-14 (×2): 10 mg via INTRAVENOUS
  Administered 2023-07-14: 30 mg via INTRAVENOUS

## 2023-07-14 MED ORDER — PANTOPRAZOLE SODIUM 40 MG PO TBEC
40.0000 mg | DELAYED_RELEASE_TABLET | Freq: Two times a day (BID) | ORAL | Status: DC
  Administered 2023-07-14 – 2023-07-17 (×7): 40 mg via ORAL
  Filled 2023-07-14 (×7): qty 1

## 2023-07-14 MED ORDER — PANTOPRAZOLE SODIUM 40 MG IV SOLR: 40.0000 mg | Freq: Two times a day (BID) | INTRAVENOUS | Status: DC

## 2023-07-14 SURGICAL SUPPLY — 15 items

## 2023-07-14 NOTE — Plan of Care (Signed)
  Problem: Health Behavior/Discharge Planning: Goal: Ability to manage health-related needs will improve Outcome: Progressing   Problem: Clinical Measurements: Goal: Ability to maintain clinical measurements within normal limits will improve Outcome: Progressing Goal: Will remain free from infection Outcome: Progressing Goal: Diagnostic test results will improve Outcome: Progressing Goal: Cardiovascular complication will be avoided Outcome: Progressing   Problem: Coping: Goal: Level of anxiety will decrease Outcome: Progressing   Problem: Safety: Goal: Ability to remain free from injury will improve Outcome: Progressing   Problem: Skin Integrity: Goal: Risk for impaired skin integrity will decrease Outcome: Progressing

## 2023-07-14 NOTE — Progress Notes (Signed)
[facility] Gastroenterology Progress Note  Frances Ortiz 89 y.o. [birth year]  CC: Nausea, vomiting, coffee-ground emesis   Subjective: Patient seen and examined at bedside.  Had elevated heart rate today and was complaining of palpitation but denies any chest pain or difficulty breathing.  Had 1 bowel movement with black-colored stool.  Denies any further nausea or vomiting.  ROS : Afebrile, positive for palpitation   Objective: Vital signs in last 24 hours: Vitals:   07/14/23 0900 07/14/23 0924  BP:  135/63  Pulse: (!) 104 (!) 105  Resp:  18  Temp:    SpO2: 98% 96%    Physical Exam: Elderly patient, resting comfortably.  Not in acute distress.  Tachycardia noted.  Abdominal exam benign.  Lab Results: Recent Labs    07/12/23 0955 07/13/23 0649  NA 141 141  K 3.9 3.6  CL 103 107  CO2 27 24  GLUCOSE 125* 110*  BUN 42* 24*  CREATININE 0.60 0.49  CALCIUM 9.0 8.6*   Recent Labs    07/12/23 0955  AST 18  ALT 12  ALKPHOS 75  BILITOT 0.5  PROT 6.3*  ALBUMIN 3.2*   Recent Labs    07/12/23 0955 07/12/23 1916 07/13/23 0649 07/14/23 0446  WBC 12.9*   < > 11.2* 9.4  NEUTROABS 10.4*  --   --   --   HGB 8.7*   < > 9.1* 8.1*  HCT 27.9*   < > 27.8* 25.4*  MCV 98.2   < > 96.9 96.2  PLT 484*   < > 382 350   < > = values in this interval not displayed.   No results for input(s): "LABPROT", "INR" in the last 72 hours.    Assessment/Plan: -Coffee-ground emesis in a patient with recent nausea and vomiting while on Lovenox.  Likely Mallory-Weiss tear versus gastritis. -Acute blood loss anemia.  Status post blood transfusion. -Dark stools -Recent hip replacement was on Lovenox. -Mild biliary dilation of CBD up to 9 mm.  Normal LFTs.  Normal lipase.  No abdominal pain today  -Tachycardia in a patient with history of SVTs..  Improved now.   Recommendations ------------------------- -Proceed with EGD today.  Risks (bleeding, infection, bowel perforation that could  require surgery, sedation-related changes in cardiopulmonary systems), benefits (identification and possible treatment of source of symptoms, exclusion of certain causes of symptoms), and alternatives (watchful waiting, radiographic imaging studies, empiric medical treatment)  were explained to patient/family in detail and patient wishes to proceed.    Kathi Der MD, FACP 07/14/2023, 9:26 AM  Contact #  [phone]

## 2023-07-14 NOTE — Anesthesia Preprocedure Evaluation (Signed)
Anesthesia Evaluation  Patient identified by MRN, date of birth, ID band Patient awake    Reviewed: Allergy & Precautions, H&P , NPO status , Patient's Chart, lab work & pertinent test results  History of Anesthesia Complications (+) PONV and history of anesthetic complications  Airway Mallampati: II   Neck ROM: full    Dental   Pulmonary neg pulmonary ROS   breath sounds clear to auscultation       Cardiovascular hypertension, + dysrhythmias  Rhythm:regular Rate:Tachycardia     Neuro/Psych  PSYCHIATRIC DISORDERS Anxiety Depression       GI/Hepatic ,GERD  ,,GI bleeding   Endo/Other    Renal/GU      Musculoskeletal   Abdominal   Peds  Hematology  (+) Blood dyscrasia, anemia Hemoglobin 8.1   Anesthesia Other Findings   Reproductive/Obstetrics                             Anesthesia Physical Anesthesia Plan  ASA: 3  Anesthesia Plan: MAC   Post-op Pain Management:    Induction: Intravenous  PONV Risk Score and Plan: 3 and Propofol infusion and Treatment may vary due to age or medical condition  Airway Management Planned: Nasal Cannula  Additional Equipment:   Intra-op Plan:   Post-operative Plan:   Informed Consent: I have reviewed the patients History and Physical, chart, labs and discussed the procedure including the risks, benefits and alternatives for the proposed anesthesia with the patient or authorized representative who has indicated his/her understanding and acceptance.     Dental advisory given  Plan Discussed with: CRNA, Anesthesiologist and Surgeon  Anesthesia Plan Comments:        Anesthesia Quick Evaluation

## 2023-07-14 NOTE — Brief Op Note (Signed)
07/14/2023  9:57 AM  PATIENT:  Frances Ortiz  89 y.o. female  PRE-OPERATIVE DIAGNOSIS:  Upper GI bleed  POST-OPERATIVE DIAGNOSIS:  gastric polyps biopsies  PROCEDURE:  Procedure(s): ESOPHAGOGASTRODUODENOSCOPY (EGD) WITH PROPOFOL (N/A) BIOPSY  SURGEON:  Surgeons and Role:    * Ahlayah Tarkowski, MD - Primary  Findings ------------ -EGD today showed few tiny gastric ulcers and gastric erosions.  Multiple gastric polyps likely fundic gland polyps.  Biopsies taken.  No evidence of active bleeding.  Recommendations ------------------------- -Advance diet to soft and slowly advance as tolerated -Okay to resume anticoagulation from GI standpoint tomorrow. -Continue Protonix 40 mg twice a day while in the hospital, recommend to discharge home on Protonix 40 mg once a day to be taken for 2 months. -Monitor H&H. -Avoid NSAIDs. -No further inpatient GI workup planned.  GI will sign off.  Call us back if needed  Kathi Der MD, FACP 07/14/2023, 9:58 AM  Contact #  [phone]

## 2023-07-14 NOTE — Progress Notes (Addendum)
ENDO RN at bedside HR 180's notified Primary RN, Anesthesia, and ENDO provider.Frances Ortiz

## 2023-07-14 NOTE — Progress Notes (Signed)
Patient has episodes of non sustained SVTs 10-14 beats.  Pt is resting, denies any chest pain, palpitations, SOB or dizziness. VS as follows: BP 121/56, HR 92, RR 18, SPO2 100%. Notified Dr. Margo Aye. Will report any changes.

## 2023-07-14 NOTE — Progress Notes (Signed)
PROGRESS NOTE    Frances Ortiz  ZOX:[id number] DOB: Jun 09, 1931 DOA: 07/12/2023 PCP: Eloisa Northern, MD   Brief Narrative:  Frances Ortiz is a 89 y.o. female with medical history significant of   hyperlipidemia, HTN, GERD, anxiety/depression, cecal cancer status post distant right Hemicolectomy presented to the ED with complaints of nausea and vomiting with notable hematemesis and questionably dark stool.  Of note patient had recent hip replacement and has been on Lovenox for DVT prophylaxis.  Hospitalist called for admit, GI called in consult.  Assessment & Plan:   Principal Problem:   Upper GI bleed Active Problems:   Pulmonary nodule   Acute blood loss anemia   Essential hypertension   Cecal cancer (HCC)   Hip fracture (HCC)   Anxiety and depression   GI bleed  Questionable upper GI bleed Acute symptomatic anemia secondary to blood loss, presumed -No further episodes of hematemesis or hematochezia/melena -GI consulted, EGD shows tiny gastric ulcers and gastric erosions and gastric polyps -Tolerated previous transfusion quite well, hemoglobin remains within normal limits  Pulmonary nodule, incidental Right upper lobe recommended repeat non contrast CT chest at 6-12 months.    Essential hypertension Hold amlodipine, metoprolol   Cecal cancer (HCC), history of, distant Cecal cancer s/p right colectomy in December of 2019.  Uneventful follow-up   Hip fracture (HCC), recent, stable Right hip fracture - ORIF by Dr. Dion Saucier on 06/22/23 Has been in rehab at SNF on lovenox for VTE prophylaxis x 30 days  Hold lovenox given above   Anxiety and depression Continue zoloft   DVT prophylaxis: SCDs Start: 07/12/23 1603   Code Status:   Code Status: Limited: Do not attempt resuscitation (DNR) -DNR-LIMITED -Do Not Intubate/DNI   Family Communication: At bedside  Status is: Inpatient  Dispo: The patient is from: Facility              Anticipated d/c is to: Facility vs HHPT               Anticipated d/c date is: 48 to 72 hours pending clinical course and need for further procedure              Patient currently not medically stable for discharge  Consultants:  GI  Procedures:  Endoscopy 9/22  Antimicrobials:  None indicated  Subjective: No acute issues or events overnight, tolerated procedure well  Objective: Vitals:   07/13/23 2250 07/13/23 2259 07/14/23 0328 07/14/23 0337  BP:      Pulse:  88 90   Resp: 18 18  18   Temp:    98.2 F (36.8 C)  TempSrc:    Oral  SpO2:  92% 100%   Weight:      Height:        Intake/Output Summary (Last 24 hours) at 07/14/2023 0758 Last data filed at 07/14/2023 0340 Gross per 24 hour  Intake 1057.13 ml  Output 1050 ml  Net 7.13 ml   Filed Weights   07/12/23 0940  Weight: 55.3 kg    Examination:  General exam: Appears calm and comfortable  Respiratory system: Clear to auscultation. Respiratory effort normal. Cardiovascular system: S1 & S2 heard, RRR. No JVD, murmurs, rubs, gallops or clicks. No pedal edema. Gastrointestinal system: Abdomen is nondistended, soft and nontender. No organomegaly or masses felt. Normal bowel sounds heard. Central nervous system: Alert and oriented. No focal neurological deficits. Extremities: Symmetric 5 x 5 power. Skin: No rashes  Data Reviewed: I have personally reviewed following labs and imaging  studies  CBC: Recent Labs  Lab 07/12/23 0955 07/12/23 1916 07/13/23 0047 07/13/23 0649 07/14/23 0446  WBC 12.9* 12.3* 14.1* 11.2* 9.4  NEUTROABS 10.4*  --   --   --   --   HGB 8.7* 7.3* 7.4* 9.1* 8.1*  HCT 27.9* 23.6* 23.4* 27.8* 25.4*  MCV 98.2 99.2 99.2 96.9 96.2  PLT 484* 407* 422* 382 350   Basic Metabolic Panel: Recent Labs  Lab 07/12/23 0955 07/13/23 0649  NA 141 141  K 3.9 3.6  CL 103 107  CO2 27 24  GLUCOSE 125* 110*  BUN 42* 24*  CREATININE 0.60 0.49  CALCIUM 9.0 8.6*   GFR: Estimated Creatinine Clearance: 34.5 mL/min (by C-G formula based on SCr of  0.49 mg/dL). Liver Function Tests: Recent Labs  Lab 07/12/23 0955  AST 18  ALT 12  ALKPHOS 75  BILITOT 0.5  PROT 6.3*  ALBUMIN 3.2*   Recent Labs  Lab 07/12/23 0955  LIPASE 28   Anemia Panel: Recent Labs    07/12/23 1619  FERRITIN 115  TIBC 288  IRON 27*   Recent Results (from the past 240 hour(s))  Resp panel by RT-PCR (RSV, Flu A&B, Covid) Anterior Nasal Swab     Status: None   Collection Time: 07/12/23 10:03 AM   Specimen: Anterior Nasal Swab  Result Value Ref Range Status   SARS Coronavirus 2 by RT PCR NEGATIVE NEGATIVE Final   Influenza A by PCR NEGATIVE NEGATIVE Final   Influenza B by PCR NEGATIVE NEGATIVE Final    Comment: (NOTE) The Xpert Xpress SARS-CoV-2/FLU/RSV plus assay is intended as an aid in the diagnosis of influenza from Nasopharyngeal swab specimens and should not be used as a sole basis for treatment. Nasal washings and aspirates are unacceptable for Xpert Xpress SARS-CoV-2/FLU/RSV testing.  Fact Sheet for Patients: BloggerCourse.com  Fact Sheet for Healthcare Providers: SeriousBroker.it  This test is not yet approved or cleared by the [place] FDA and has been authorized for detection and/or diagnosis of SARS-CoV-2 by FDA under an Emergency Use Authorization (EUA). This EUA will remain in effect (meaning this test can be used) for the duration of the COVID-19 declaration under Section 564(b)(1) of the Act, 21 U.S.C. section 360bbb-3(b)(1), unless the authorization is terminated or revoked.     Resp Syncytial Virus by PCR NEGATIVE NEGATIVE Final    Comment: (NOTE) Fact Sheet for Patients: BloggerCourse.com  Fact Sheet for Healthcare Providers: SeriousBroker.it  This test is not yet approved or cleared by the [place] FDA and has been authorized for detection and/or diagnosis of SARS-CoV-2 by FDA under an Emergency Use  Authorization (EUA). This EUA will remain in effect (meaning this test can be used) for the duration of the COVID-19 declaration under Section 564(b)(1) of the Act, 21 U.S.C. section 360bbb-3(b)(1), unless the authorization is terminated or revoked.  Performed at [facility], [address], [place], [place] [address]          Radiology Studies: CT Angio Chest PE W and/or Wo Contrast  Result Date: 07/12/2023 CLINICAL DATA:  Nausea and vomiting EXAM: CT ANGIOGRAPHY CHEST CT ABDOMEN AND PELVIS WITH CONTRAST TECHNIQUE: Multidetector CT imaging of the chest was performed using the standard protocol during bolus administration of intravenous contrast. Multiplanar CT image reconstructions and MIPs were obtained to evaluate the vascular anatomy. Multidetector CT imaging of the abdomen and pelvis was performed using the standard protocol during bolus administration of intravenous contrast. RADIATION DOSE REDUCTION: This exam was  performed according to the departmental dose-optimization program which includes automated exposure control, adjustment of the mA and/or kV according to patient size and/or use of iterative reconstruction technique. CONTRAST:  75mL OMNIPAQUE IOHEXOL 350 MG/ML SOLN COMPARISON:  None Available. FINDINGS: CTA CHEST FINDINGS Cardiovascular: No evidence of pulmonary embolus. Normal heart size. No pericardial effusion. Normal caliber thoracic aorta with mild atherosclerotic disease. Severe coronary artery calcifications. Mildly dilated main pulmonary artery, measuring up to 3.2 cm. Mediastinum/Nodes: Small hiatal hernia. Thyroid is unremarkable. No enlarged lymph nodes seen in the chest. Lungs/Pleura: Central airways are patent. Bilateral mosaic attenuation. Solid pulmonary nodule of the posterior right upper lobe measuring 6 mm on series 4, image 34. Musculoskeletal: No chest wall abnormality. No acute or significant osseous findings. Review of the MIP images confirms the above  findings. CT ABDOMEN and PELVIS FINDINGS Hepatobiliary: No suspicious liver lesion. Prior cholecystectomy. Slightly increased common bile duct dilation, measuring up to 9 mm, previously 7 mm. New mild intrahepatic biliary ductal dilation. Pancreas: Unremarkable. No pancreatic ductal dilatation or surrounding inflammatory changes. Spleen: Normal in size without focal abnormality. Adrenals/Urinary Tract: Unchanged right adrenal gland adenoma measuring up to 1.6 cm, consistent with benign adenoma given long-term stability, no specific follow-up imaging is necessary. Bilateral left-greater-than-right parapelvic cysts. Additional bilateral low-attenuation renal lesions, largest are consistent with simple cysts, others are too small to accurately characterize, no specific follow-up imaging is necessary. Punctate left-greater-than-right nonobstructing renal stones. Bladder is unremarkable. Stomach/Bowel: Stomach is within normal limits. Small duodenal diverticulum. Prior ileocecectomy. Diverticulosis. No evidence of bowel wall thickening, distention, or inflammatory changes. Vascular/Lymphatic: Mild aortic atherosclerosis. No enlarged abdominal or pelvic lymph nodes. Reproductive: Limits evaluation due to streak artifact. Uterus is unremarkable. Fluid noted in the vaginal cuff. Other: No abdominal wall hernia or abnormality. No abdominopelvic ascites. Musculoskeletal: Prior right hip replacement. Multilevel compression deformities of the lumbar spine with kyphoplasty changes at L2-L3. Unchanged mild anterolisthesis of L4 on L5. Review of the MIP images confirms the above findings. IMPRESSION: Chest CTA: 1. No evidence of pulmonary embolus. 2. Bilateral mosaic attenuation, which can be seen in the setting of small airways disease or pulmonary hypertension. 3. Solid pulmonary nodule of the posterior right upper lobe measuring 6 mm. Non-contrast chest CT at 6-12 months is recommended. If the nodule is stable at time of repeat  CT, then future CT at 18-24 months (from today's scan) is considered optional for low-risk patients, but is recommended for high-risk patients. This recommendation follows the consensus statement: Guidelines for Management of Incidental Pulmonary Nodules Detected on CT Images: From the Fleischner Society 2017; Radiology 2017; 284:228-243. 4. Coronary artery calcifications Abdomen and pelvis CT: 1. Slightly increased common bile duct dilation, measuring up to 9 mm, previously 7 mm. New mild intrahepatic biliary ductal dilation. Correlate with liver function tests. If LFTs are abnormal, consider further evaluation with MRCP. 2. Aortic Atherosclerosis (ICD10-I70.0). Electronically Signed   By: Allegra Lai M.D.   On: 07/12/2023 13:18   CT ABDOMEN PELVIS W CONTRAST  Result Date: 07/12/2023 CLINICAL DATA:  Nausea and vomiting EXAM: CT ANGIOGRAPHY CHEST CT ABDOMEN AND PELVIS WITH CONTRAST TECHNIQUE: Multidetector CT imaging of the chest was performed using the standard protocol during bolus administration of intravenous contrast. Multiplanar CT image reconstructions and MIPs were obtained to evaluate the vascular anatomy. Multidetector CT imaging of the abdomen and pelvis was performed using the standard protocol during bolus administration of intravenous contrast. RADIATION DOSE REDUCTION: This exam was performed according to the departmental dose-optimization program which  includes automated exposure control, adjustment of the mA and/or kV according to patient size and/or use of iterative reconstruction technique. CONTRAST:  75mL OMNIPAQUE IOHEXOL 350 MG/ML SOLN COMPARISON:  None Available. FINDINGS: CTA CHEST FINDINGS Cardiovascular: No evidence of pulmonary embolus. Normal heart size. No pericardial effusion. Normal caliber thoracic aorta with mild atherosclerotic disease. Severe coronary artery calcifications. Mildly dilated main pulmonary artery, measuring up to 3.2 cm. Mediastinum/Nodes: Small hiatal  hernia. Thyroid is unremarkable. No enlarged lymph nodes seen in the chest. Lungs/Pleura: Central airways are patent. Bilateral mosaic attenuation. Solid pulmonary nodule of the posterior right upper lobe measuring 6 mm on series 4, image 34. Musculoskeletal: No chest wall abnormality. No acute or significant osseous findings. Review of the MIP images confirms the above findings. CT ABDOMEN and PELVIS FINDINGS Hepatobiliary: No suspicious liver lesion. Prior cholecystectomy. Slightly increased common bile duct dilation, measuring up to 9 mm, previously 7 mm. New mild intrahepatic biliary ductal dilation. Pancreas: Unremarkable. No pancreatic ductal dilatation or surrounding inflammatory changes. Spleen: Normal in size without focal abnormality. Adrenals/Urinary Tract: Unchanged right adrenal gland adenoma measuring up to 1.6 cm, consistent with benign adenoma given long-term stability, no specific follow-up imaging is necessary. Bilateral left-greater-than-right parapelvic cysts. Additional bilateral low-attenuation renal lesions, largest are consistent with simple cysts, others are too small to accurately characterize, no specific follow-up imaging is necessary. Punctate left-greater-than-right nonobstructing renal stones. Bladder is unremarkable. Stomach/Bowel: Stomach is within normal limits. Small duodenal diverticulum. Prior ileocecectomy. Diverticulosis. No evidence of bowel wall thickening, distention, or inflammatory changes. Vascular/Lymphatic: Mild aortic atherosclerosis. No enlarged abdominal or pelvic lymph nodes. Reproductive: Limits evaluation due to streak artifact. Uterus is unremarkable. Fluid noted in the vaginal cuff. Other: No abdominal wall hernia or abnormality. No abdominopelvic ascites. Musculoskeletal: Prior right hip replacement. Multilevel compression deformities of the lumbar spine with kyphoplasty changes at L2-L3. Unchanged mild anterolisthesis of L4 on L5. Review of the MIP images  confirms the above findings. IMPRESSION: Chest CTA: 1. No evidence of pulmonary embolus. 2. Bilateral mosaic attenuation, which can be seen in the setting of small airways disease or pulmonary hypertension. 3. Solid pulmonary nodule of the posterior right upper lobe measuring 6 mm. Non-contrast chest CT at 6-12 months is recommended. If the nodule is stable at time of repeat CT, then future CT at 18-24 months (from today's scan) is considered optional for low-risk patients, but is recommended for high-risk patients. This recommendation follows the consensus statement: Guidelines for Management of Incidental Pulmonary Nodules Detected on CT Images: From the Fleischner Society 2017; Radiology 2017; 284:228-243. 4. Coronary artery calcifications Abdomen and pelvis CT: 1. Slightly increased common bile duct dilation, measuring up to 9 mm, previously 7 mm. New mild intrahepatic biliary ductal dilation. Correlate with liver function tests. If LFTs are abnormal, consider further evaluation with MRCP. 2. Aortic Atherosclerosis (ICD10-I70.0). Electronically Signed   By: Allegra Lai M.D.   On: 07/12/2023 13:18    Scheduled Meds:  docusate sodium  100 mg Oral BID   ferrous sulfate  325 mg Oral Q breakfast   influenza vaccine adjuvanted  0.5 mL Intramuscular Tomorrow-1000   [START ON 07/16/2023] pantoprazole  40 mg Intravenous Q12H   psyllium  1 packet Oral Daily   sertraline  50 mg Oral Daily   Continuous Infusions:  pantoprazole 8 mg/hr (07/14/23 0023)     LOS: 1 day   Time spent:  Azucena Fallen, DO [facility]  If 7PM-7AM, please contact night-coverage [email or web address]  07/14/2023, 7:58 AM

## 2023-07-14 NOTE — Transfer of Care (Signed)
Immediate Anesthesia Transfer of Care Note  Patient: Frances Ortiz  Procedure(s) Performed: ESOPHAGOGASTRODUODENOSCOPY (EGD) WITH PROPOFOL BIOPSY  Patient Location: Endoscopy Unit  Anesthesia Type:MAC  Level of Consciousness: drowsy  Airway & Oxygen Therapy: Patient Spontanous Breathing  Post-op Assessment: Report given to RN and Post -op Vital signs reviewed and stable  Post vital signs: Reviewed and stable  Last Vitals:  Vitals Value Taken Time  BP 113/61   Temp    Pulse 106 07/14/23 1002  Resp 17 07/14/23 1002  SpO2 98 % 07/14/23 1002  Vitals shown include unfiled device data.  Last Pain:  Vitals:   07/14/23 0924  TempSrc: Temporal  PainSc: 0-No pain      Patients Stated Pain Goal: 0 (07/13/23 1940)  Complications: No notable events documented.

## 2023-07-14 NOTE — Op Note (Signed)
[facility] Patient Name: Frances Ortiz Procedure Date : 07/14/2023 MRN: [id number] Attending MD: Kathi Der , MD, [id number] Date of Birth: 05-12-1931 CSN: [id number] Age: 89 Admit Type: Inpatient Procedure:                Upper GI endoscopy Indications:              Coffee-ground emesis, Melena Providers:                Kathi Der, MD, Jacquelyn "Lynelle Doctor, RN,                            Eliberto Ivory, RN, Harrington Challenger, Technician Referring MD:              Medicines:                Sedation Administered by an Anesthesia Professional Complications:            No immediate complications. Estimated Blood Loss:     Estimated blood loss was minimal. Procedure:                Pre-Anesthesia Assessment:                           - Prior to the procedure, a History and Physical                            was performed, and patient medications and                            allergies were reviewed. The patient's tolerance of                            previous anesthesia was also reviewed. The risks                            and benefits of the procedure and the sedation                            options and risks were discussed with the patient.                            All questions were answered, and informed consent                            was obtained. Prior Anticoagulants: The patient has                            taken Lovenox (enoxaparin), last dose was 2 days                            prior to procedure. ASA Grade Assessment: III - A                            patient with severe systemic disease. After  reviewing the risks and benefits, the patient was                            deemed in satisfactory condition to undergo the                            procedure.                           After obtaining informed consent, the endoscope was                            passed under direct vision. Throughout the                             procedure, the patient's blood pressure, pulse, and                            oxygen saturations were monitored continuously. The                            [id number]-[id number] ([id number]) Olympus endoscope was introduced                            through the mouth, and advanced to the second part                            of duodenum. The upper GI endoscopy was                            accomplished without difficulty. The patient                            tolerated the procedure well. Scope In: Scope Out: Findings:      A widely patent Schatzki ring was found at the gastroesophageal junction.      A small hiatal hernia was present.      Three non-bleeding superficial gastric ulcers with no stigmata of       bleeding were found in the prepyloric region of the stomach. The largest       lesion was 2 mm in largest dimension.      Multiple 5 to 8 mm sessile polyps were found in the gastric fundus and       in the gastric body. Biopsies were taken with a cold forceps for       histology.      The cardia and gastric fundus were normal on retroflexion.      Diffuse mildly erythematous mucosa was found in the duodenal bulb.      The first portion of the duodenum and second portion of the duodenum       were normal. Impression:               - Widely patent Schatzki ring.                           - Small hiatal hernia.                           -  Non-bleeding gastric ulcers with no stigmata of                            bleeding.                           - Multiple gastric polyps. Biopsied.                           - Erythematous duodenopathy.                           - Normal first portion of the duodenum and second                            portion of the duodenum. Recommendation:           - Return patient to hospital ward for ongoing care.                           - Soft diet.                           - Continue present medications.                           - Await pathology  results. Procedure Code(s):        --- Professional ---                           [phone], Esophagogastroduodenoscopy, flexible,                            transoral; with biopsy, single or multiple Diagnosis Code(s):        --- Professional ---                           K22.2, Esophageal obstruction                           K44.9, Diaphragmatic hernia without obstruction or                            gangrene                           K25.9, Gastric ulcer, unspecified as acute or                            chronic, without hemorrhage or perforation                           K31.7, Polyp of stomach and duodenum                           K31.89, Other diseases of stomach and duodenum  K92.0, Hematemesis                           K92.1, Melena (includes Hematochezia) CPT copyright 2022 American Medical Association. All rights reserved. The codes documented in this report are preliminary and upon coder review may  be revised to meet current compliance requirements. Kathi Der, MD Kathi Der, MD 07/14/2023 10:05:32 AM Number of Addenda: 0

## 2023-07-15 DIAGNOSIS — K922 Gastrointestinal hemorrhage, unspecified: Secondary | ICD-10-CM | POA: Diagnosis not present

## 2023-07-15 LAB — CBC
HCT: 27.5 % — ABNORMAL LOW (ref 36.0–46.0)
Hemoglobin: 8.8 g/dL — ABNORMAL LOW (ref 12.0–15.0)
MCH: 30.7 pg (ref 26.0–34.0)
MCHC: 32 g/dL (ref 30.0–36.0)
MCV: 95.8 fL (ref 80.0–100.0)
Platelets: 384 10*3/uL (ref 150–400)
RBC: 2.87 MIL/uL — ABNORMAL LOW (ref 3.87–5.11)
RDW: 14.1 % (ref 11.5–15.5)
WBC: 8.2 10*3/uL (ref 4.0–10.5)
nRBC: 0 % (ref 0.0–0.2)

## 2023-07-15 NOTE — Progress Notes (Signed)
PROGRESS NOTE    Frances Ortiz  YNW:[id number] DOB: [birth year] DOA: 07/12/2023 PCP: Eloisa Northern, MD   Brief Narrative:  Frances Ortiz is a 89 y.o. female with medical history significant of   hyperlipidemia, HTN, GERD, anxiety/depression, cecal cancer status post distant right Hemicolectomy presented to the ED with complaints of nausea and vomiting with notable hematemesis and questionably dark stool.  Of note patient had recent hip replacement and has been on Lovenox for DVT prophylaxis.  Hospitalist called for admit, GI called in consult.  Patient clinically improving given above, endoscopy did show tiny gastric ulcers and gastric erosion with polyps, biopsy taken.  No further signs or symptoms of bleeding.  Patient initially planned for discharge home.  She had been admitted from nursing facility but was supposedly "graduating" in the next 24 hours, as such PT was asked to evaluate and noted the patient remains markedly weak with profound instability recommending further evaluation at SNF.  Patient otherwise medically stable for discharge  Assessment & Plan:   Principal Problem:   Upper GI bleed Active Problems:   Pulmonary nodule   Acute blood loss anemia   Essential hypertension   Cecal cancer (HCC)   Hip fracture (HCC)   Anxiety and depression   GI bleed  Questionable upper GI bleed Acute symptomatic anemia secondary to blood loss, presumed -No further episodes of hematemesis or hematochezia/melena -Continue to advance diet as tolerated -GI consulted, EGD shows tiny gastric ulcers and gastric erosions and gastric polyps -Tolerated previous transfusion quite well, hemoglobin remains near baseline  Pulmonary nodule, incidental Right upper lobe recommended repeat non contrast CT chest at 6-12 months.    Essential hypertension Hold amlodipine, metoprolol   Cecal cancer (HCC), history of, distant Cecal cancer s/p right colectomy in December of 2019.  Uneventful follow-up    Hip fracture (HCC), recent, stable Right hip fracture - ORIF by Dr. Dion Saucier on 06/22/23 Has been in rehab at SNF on lovenox for VTE prophylaxis x 30 days  Hold lovenox given above   Anxiety and depression Continue zoloft   DVT prophylaxis: enoxaparin (LOVENOX) injection 40 mg Start: 07/14/23 2200 SCDs Start: 07/12/23 1603   Code Status:   Code Status: Limited: Do not attempt resuscitation (DNR) -DNR-LIMITED -Do Not Intubate/DNI   Family Communication: At bedside  Status is: Inpatient  Dispo: The patient is from: Facility              Anticipated d/c is to: Facility              Anticipated d/c date is: Pending safe disposition location at SNF              Patient currently medically stable for discharge  Consultants:  GI  Procedures:  Endoscopy 9/22  Antimicrobials:  None indicated  Subjective: No acute issues or events overnight, quite weak after physical therapy requesting to return to bed.  Otherwise denies nausea vomiting diarrhea constipation headache fevers chills or chest pain.  Notable dyspnea and weakness with exertion.  Objective: Vitals:   07/14/23 1214 07/14/23 1932 07/15/23 0632 07/15/23 1217  BP: (!) 132/58 134/65 (!) 142/69 121/63  Pulse: 98 98 88 (!) 113  Resp: 14 15 14 16   Temp: 98.7 F (37.1 C) 98.6 F (37 C) 98.5 F (36.9 C) 98.7 F (37.1 C)  TempSrc: Oral Oral Oral Oral  SpO2: 97% 94%  96%  Weight:      Height:        Intake/Output Summary (Last  24 hours) at 07/15/2023 1332 Last data filed at 07/15/2023 0634 Gross per 24 hour  Intake 384.17 ml  Output 400 ml  Net -15.83 ml   Filed Weights   07/12/23 0940 07/14/23 0924  Weight: 55.3 kg 54.4 kg    Examination:  General exam: Appears calm and comfortable  Respiratory system: Clear to auscultation. Respiratory effort normal. Cardiovascular system: S1 & S2 heard, RRR. No JVD, murmurs, rubs, gallops or clicks. No pedal edema. Gastrointestinal system: Abdomen is nondistended, soft and  nontender. No organomegaly or masses felt. Normal bowel sounds heard. Central nervous system: Alert and oriented. No focal neurological deficits. Extremities: Symmetric 5 x 5 power. Skin: No rashes  Data Reviewed: I have personally reviewed following labs and imaging studies  CBC: Recent Labs  Lab 07/12/23 0955 07/12/23 1916 07/13/23 0047 07/13/23 0649 07/14/23 0446 07/15/23 0434  WBC 12.9* 12.3* 14.1* 11.2* 9.4 8.2  NEUTROABS 10.4*  --   --   --   --   --   HGB 8.7* 7.3* 7.4* 9.1* 8.1* 8.8*  HCT 27.9* 23.6* 23.4* 27.8* 25.4* 27.5*  MCV 98.2 99.2 99.2 96.9 96.2 95.8  PLT 484* 407* 422* 382 350 384   Basic Metabolic Panel: Recent Labs  Lab 07/12/23 0955 07/13/23 0649  NA 141 141  K 3.9 3.6  CL 103 107  CO2 27 24  GLUCOSE 125* 110*  BUN 42* 24*  CREATININE 0.60 0.49  CALCIUM 9.0 8.6*   GFR: Estimated Creatinine Clearance: 34.2 mL/min (by C-G formula based on SCr of 0.49 mg/dL). Liver Function Tests: Recent Labs  Lab 07/12/23 0955  AST 18  ALT 12  ALKPHOS 75  BILITOT 0.5  PROT 6.3*  ALBUMIN 3.2*   Recent Labs  Lab 07/12/23 0955  LIPASE 28   Anemia Panel: Recent Labs    07/12/23 1619  FERRITIN 115  TIBC 288  IRON 27*   Recent Results (from the past 240 hour(s))  Resp panel by RT-PCR (RSV, Flu A&B, Covid) Anterior Nasal Swab     Status: None   Collection Time: 07/12/23 10:03 AM   Specimen: Anterior Nasal Swab  Result Value Ref Range Status   SARS Coronavirus 2 by RT PCR NEGATIVE NEGATIVE Final   Influenza A by PCR NEGATIVE NEGATIVE Final   Influenza B by PCR NEGATIVE NEGATIVE Final    Comment: (NOTE) The Xpert Xpress SARS-CoV-2/FLU/RSV plus assay is intended as an aid in the diagnosis of influenza from Nasopharyngeal swab specimens and should not be used as a sole basis for treatment. Nasal washings and aspirates are unacceptable for Xpert Xpress SARS-CoV-2/FLU/RSV testing.  Fact Sheet for  Patients: BloggerCourse.com  Fact Sheet for Healthcare Providers: SeriousBroker.it  This test is not yet approved or cleared by the [place] FDA and has been authorized for detection and/or diagnosis of SARS-CoV-2 by FDA under an Emergency Use Authorization (EUA). This EUA will remain in effect (meaning this test can be used) for the duration of the COVID-19 declaration under Section 564(b)(1) of the Act, 21 U.S.C. section 360bbb-3(b)(1), unless the authorization is terminated or revoked.     Resp Syncytial Virus by PCR NEGATIVE NEGATIVE Final    Comment: (NOTE) Fact Sheet for Patients: BloggerCourse.com  Fact Sheet for Healthcare Providers: SeriousBroker.it  This test is not yet approved or cleared by the [place] FDA and has been authorized for detection and/or diagnosis of SARS-CoV-2 by FDA under an Emergency Use Authorization (EUA). This EUA will remain in effect (meaning this test can  be used) for the duration of the COVID-19 declaration under Section 564(b)(1) of the Act, 21 U.S.C. section 360bbb-3(b)(1), unless the authorization is terminated or revoked.  Performed at [facility], [address] [place], [place] [address]          Radiology Studies: No results found.  Scheduled Meds:  docusate sodium  100 mg Oral BID   enoxaparin  40 mg Subcutaneous Q24H   ferrous sulfate  325 mg Oral Q breakfast   influenza vaccine adjuvanted  0.5 mL Intramuscular Tomorrow-1000   pantoprazole  40 mg Oral BID   psyllium  1 packet Oral Daily   sertraline  50 mg Oral Daily   Continuous Infusions:     LOS: 2 days   Time spent:  Azucena Fallen, DO [facility]  If 7PM-7AM, please contact night-coverage [email or web address]  07/15/2023, 1:32 PM

## 2023-07-15 NOTE — Care Management Important Message (Signed)
Important Message  Patient Details  Name: SAIDEE CASSENS MRN: [id number] Date of Birth: [birth year]   Medicare Important Message Given:  Yes     Sherilyn Banker 07/15/2023, 2:41 PM

## 2023-07-15 NOTE — TOC Initial Note (Addendum)
Transition of Care [facility]) - Initial/Assessment Note    Patient Details  Name: Frances Ortiz MRN: [id number] Date of Birth: 10/29/1930  Transition of Care [facility]) CM/SW Contact:    Delilah Shan, LCSWA Phone Number: 07/15/2023, 11:31 AM  Clinical Narrative:                  CSW received consult for possible SNF placement at time of discharge. CSW spoke with patient at bedside regarding PT recommendation of SNF placement at time of discharge. Patient reports PTA she comes from [facility].Patient expressed understanding of PT recommendation and is agreeable to SNF placement at time of discharge. Patient reports preference for [facility]. Patient gave CSW permission to fax out initial referral near the [facility] area for possible SNF placement. CSW discussed insurance authorization process and will provide Medicare SNF ratings list with accepted SNF bed offers when available. No further questions reported at this time. CSW to continue to follow and assist with discharge planning needs.   Update-2:48pm- Whitney with [facility] confirmed unable to offer SNF bed for patient. No bed availability currently. CSW awaiting additional pending facility's to review patients referral.  Expected Discharge Plan: Skilled Nursing Facility Barriers to Discharge: Continued Medical Work up   Patient Goals and CMS Choice Patient states their goals for this hospitalization and ongoing recovery are:: SNF CMS Medicare.gov Compare Post Acute Care list provided to:: Patient Choice offered to / list presented to : Patient      Expected Discharge Plan and Services In-house Referral: Clinical Social Work     Living arrangements for the past 2 months:  (from [facility] short term rehab usually lives at home alone)                                      Prior Living Arrangements/Services Living arrangements for the past 2 months:  (from [facility] short term rehab usually lives at home  alone) Lives with:: Self Patient language and need for interpreter reviewed:: Yes Do you feel safe going back to the place where you live?: No   SNF  Need for Family Participation in Patient Care: Yes (Comment) Care giver support system in place?: Yes (comment)   Criminal Activity/Legal Involvement Pertinent to Current Situation/Hospitalization: No - Comment as needed  Activities of Daily Living Home Assistive Devices/Equipment: Walker (specify type) ADL Screening (condition at time of admission) Patient's cognitive ability adequate to safely complete daily activities?: Yes Is the patient deaf or have difficulty hearing?: No Does the patient have difficulty seeing, even when wearing glasses/contacts?: No Does the patient have difficulty concentrating, remembering, or making decisions?: No Patient able to express need for assistance with ADLs?: Yes Does the patient have difficulty dressing or bathing?: Yes Independently performs ADLs?: No Communication: Independent Dressing (OT): Needs assistance Is this a change from baseline?: Pre-admission baseline Grooming: Needs assistance Is this a change from baseline?: Pre-admission baseline Feeding: Independent Bathing: Needs assistance Is this a change from baseline?: Pre-admission baseline Toileting: Needs assistance Is this a change from baseline?: Pre-admission baseline In/Out Bed: Needs assistance Is this a change from baseline?: Pre-admission baseline Walks in Home: Needs assistance Is this a change from baseline?: Pre-admission baseline Does the patient have difficulty walking or climbing stairs?: Yes Weakness of Legs: Right Weakness of Arms/Hands: None  Permission Sought/Granted Permission sought to share information with : Case Manager, Family Supports, Magazine features editor Permission granted to share  information with : Yes, Verbal Permission Granted  Share Information with NAME: [name]  Permission granted to share  info w AGENCY: SNF  Permission granted to share info w Relationship: daughter  Permission granted to share info w Contact Information: [name] [phone]  Emotional Assessment Appearance:: Appears stated age Attitude/Demeanor/Rapport: Gracious Affect (typically observed): Calm Orientation: : Oriented to Self, Oriented to Place, Oriented to  Time, Oriented to Situation Alcohol / Substance Use: Not Applicable Psych Involvement: No (comment)  Admission diagnosis:  Upper GI bleed [K92.2] GI bleed [K92.2] Patient Active Problem List   Diagnosis Date Noted   GI bleed 07/13/2023   Upper GI bleed 07/12/2023   Pulmonary nodule 07/12/2023   Essential hypertension 07/12/2023   Acute blood loss anemia 07/12/2023   Anxiety and depression 07/12/2023   Hip fracture (HCC) 06/21/2023   Sinus tachycardia 06/21/2023   Elevated blood pressure reading 06/21/2023   At risk for inadequate pain control 02/25/2023   Lumbar compression fracture (HCC) 02/25/2023   Lumbar spinal stenosis 02/25/2023   S/P right colectomy 02/25/2023   Inadequate pain control 02/25/2023   Cecal cancer (HCC) 10/07/2017   Cholelithiasis 11/13/2012   PCP:  Eloisa Northern, MD Pharmacy:   [facility] [id number] - [place], [place] - [address] [place] [address] Phone: [phone] Fax: [phone]     Social Determinants of Health (SDOH) Social History: SDOH Screenings   Food Insecurity: No Food Insecurity (07/12/2023)  Housing: Patient Declined (07/12/2023)  Transportation Needs: No Transportation Needs (07/12/2023)  Utilities: Not At Risk (07/12/2023)  Tobacco Use: Low Risk  (07/12/2023)   SDOH Interventions:     Readmission Risk Interventions     No data to display

## 2023-07-15 NOTE — Evaluation (Signed)
Physical Therapy Evaluation Patient Details Name: Frances Ortiz MRN: [id number] DOB: [birth year] Today's Date: 07/15/2023  History of Present Illness  89 yo female presents to [facility] On 9/20 from SNF for n/v, hematemesis. S/p EGD 9/22, showing non-bleeding gastric ulcers, multiple gastric polyps s/p biopsies. Recent admission for fall and R hip fx, s/p R hip hemiarthroplasty posterior approach on 8/31.  PMH significant of hyperlipidemia, anxiety/depression, cecal cancer s/p R colectomy 2018, GERD, osteoporosis.  Clinical Impression   Pt presents with generalized weakness especially RLE, impaired balance, poor activity tolerance, and moderate assist for mobility at this time. Pt to benefit from acute PT to address deficits. Pt tolerated x2 transfers into standing, mod assist for all aspects, and could not progress to gait given fatigue, imbalance, and weakness. Patient will benefit from continued inpatient follow up therapy, <3 hours/day. PT to progress mobility as tolerated, and will continue to follow acutely.          If plan is discharge home, recommend the following: A lot of help with bathing/dressing/bathroom;Assistance with cooking/housework;Assist for transportation;Help with stairs or ramp for entrance;A lot of help with walking and/or transfers   Can travel by private vehicle        Equipment Recommendations None recommended by PT  Recommendations for Other Services       Functional Status Assessment Patient has had a recent decline in their functional status and demonstrates the ability to make significant improvements in function in a reasonable and predictable amount of time.     Precautions / Restrictions Precautions Precautions: Fall Precaution Comments: posterior hip - reviewed precautions Restrictions Weight Bearing Restrictions: No RLE Weight Bearing: Weight bearing as tolerated      Mobility  Bed Mobility Overal bed mobility: Needs Assistance Bed Mobility:  Supine to Sit     Supine to sit: Mod assist, HOB elevated     General bed mobility comments: assist for trunk elevation, LE progression to EOB, scooting forward with use of bed pad.    Transfers Overall transfer level: Needs assistance Equipment used: Rolling walker (2 wheels) Transfers: Sit to/from Stand Sit to Stand: Mod assist   Step pivot transfers: Mod assist       General transfer comment: assist for rise, steady, pivotal steps to recliner. Pt with shortened step length and width, truncal flexion, cues to correct but pt with limited application. stand x2 from EOB    Ambulation/Gait                  Stairs            Wheelchair Mobility     Tilt Bed    Modified Rankin (Stroke Patients Only)       Balance Overall balance assessment: Needs assistance Sitting-balance support: No upper extremity supported Sitting balance-Leahy Scale: Fair     Standing balance support: Bilateral upper extremity supported, Reliant on assistive device for balance Standing balance-Leahy Scale: Poor Standing balance comment: reliant on external support                             Pertinent Vitals/Pain Pain Assessment Pain Assessment: Faces Faces Pain Scale: Hurts little more Pain Location: Rt hip Pain Descriptors / Indicators: Aching, Grimacing, Guarding, Discomfort Pain Intervention(s): Limited activity within patient's tolerance, Monitored during session, Repositioned    Home Living Family/patient expects to be discharged to:: Private residence Living Arrangements: Alone Available Help at Discharge: Family;Personal care attendant;Available PRN/intermittently Type of Home:  House Home Access: Stairs to enter Entrance Stairs-Rails: None Entrance Stairs-Number of Steps: 2   Home Layout: One level Home Equipment: Shower seat;Cane - single point;Rollator (4 wheels);Rolling Walker (2 wheels);Lift chair Additional Comments: caregiver from 8:30-12:30 and  again in the evening (pt not sure of times). caregivers 7 days/week    Prior Function Prior Level of Function : Needs assist;History of Falls (last six months)             Mobility Comments: walking with RW at rehab ADLs Comments: has been getting help with dressing and bathing since hip fx     Extremity/Trunk Assessment   Upper Extremity Assessment Upper Extremity Assessment: Defer to OT evaluation    Lower Extremity Assessment Lower Extremity Assessment: Generalized weakness;RLE deficits/detail RLE Deficits / Details: tolerates AAROM hip flexion to 90 deg, painful in WB RLE: Unable to fully assess due to pain    Cervical / Trunk Assessment Cervical / Trunk Assessment: Kyphotic  Communication   Communication Communication: Hearing impairment Cueing Techniques: Verbal cues;Tactile cues  Cognition Arousal: Alert Behavior During Therapy: WFL for tasks assessed/performed, Flat affect Overall Cognitive Status: Within Functional Limits for tasks assessed                       Memory: Decreased recall of precautions         General Comments: a&ox4        General Comments General comments (skin integrity, edema, etc.): HR 99-131 bpm during session, RN And MD notified    Exercises     Assessment/Plan    PT Assessment Patient needs continued PT services  PT Problem List Decreased strength;Decreased range of motion;Decreased activity tolerance;Decreased balance;Decreased mobility;Decreased knowledge of use of DME;Decreased knowledge of precautions;Pain;Decreased safety awareness;Cardiopulmonary status limiting activity       PT Treatment Interventions DME instruction;Gait training;Functional mobility training;Therapeutic activities;Therapeutic exercise;Patient/family education;Balance training    PT Goals (Current goals can be found in the Care Plan section)  Acute Rehab PT Goals Patient Stated Goal: return home PT Goal Formulation: With patient Time For  Goal Achievement: 07/29/23 Potential to Achieve Goals: Good    Frequency Min 1X/week     Co-evaluation               AM-PAC PT "6 Clicks" Mobility  Outcome Measure Help needed turning from your back to your side while in a flat bed without using bedrails?: A Lot Help needed moving from lying on your back to sitting on the side of a flat bed without using bedrails?: A Lot Help needed moving to and from a bed to a chair (including a wheelchair)?: A Lot Help needed standing up from a chair using your arms (e.g., wheelchair or bedside chair)?: A Lot Help needed to walk in hospital room?: Total Help needed climbing 3-5 steps with a railing? : Total 6 Click Score: 10    End of Session Equipment Utilized During Treatment: Gait belt Activity Tolerance: Patient limited by pain;Patient limited by fatigue Patient left: with call bell/phone within reach;with family/visitor present;in chair;with chair alarm set (one of pt's PCA at bedside) Nurse Communication: Mobility status PT Visit Diagnosis: Other abnormalities of gait and mobility (R26.89);History of falling (Z91.81);Muscle weakness (generalized) (M62.81);Pain    Time: 4132-4401 PT Time Calculation (min) (ACUTE ONLY): 28 min   Charges:   PT Evaluation $PT Eval Low Complexity: 1 Low PT Treatments $Therapeutic Activity: 8-22 mins PT General Charges $$ ACUTE PT VISIT: 1 Visit  Marye Round, PT DPT Acute Rehabilitation Services Secure Chat Preferred  Office [phone]   Truddie Coco 07/15/2023, 10:40 AM

## 2023-07-15 NOTE — [place] FL2 (Addendum)
MEDICAID FL2 LEVEL OF CARE FORM     IDENTIFICATION  Patient Name: Frances Ortiz Birthdate: [birth year] Sex: female Admission Date (Current Location): 07/12/2023  [facility] and [place] Number:  Producer, television/film/video and Address:  [facility], [address], [place], [place] [address]      Provider Number: [id number]  Attending Physician Name and Address:  Azucena Fallen, MD  Relative Name and Phone Number:  [name] (daughter) [phone]    Current Level of Care: Hospital Recommended Level of Care: Skilled Nursing Facility Prior Approval Number:    Date Approved/Denied:   PASRR Number: [id number]  Discharge Plan: SNF    Current Diagnoses: Patient Active Problem List   Diagnosis Date Noted   GI bleed 07/13/2023   Upper GI bleed 07/12/2023   Pulmonary nodule 07/12/2023   Essential hypertension 07/12/2023   Acute blood loss anemia 07/12/2023   Anxiety and depression 07/12/2023   Hip fracture (HCC) 06/21/2023   Sinus tachycardia 06/21/2023   Elevated blood pressure reading 06/21/2023   At risk for inadequate pain control 02/25/2023   Lumbar compression fracture (HCC) 02/25/2023   Lumbar spinal stenosis 02/25/2023   S/P right colectomy 02/25/2023   Inadequate pain control 02/25/2023   Cecal cancer (HCC) 10/07/2017   Cholelithiasis 11/13/2012    Orientation RESPIRATION BLADDER Height & Weight     Self, Time, Situation, Place  Normal Incontinent, External catheter (External Urinary Catheter) Weight: 120 lb (54.4 kg) Height:  4\' 10"  (147.3 cm)  BEHAVIORAL SYMPTOMS/MOOD NEUROLOGICAL BOWEL NUTRITION STATUS      Incontinent Diet (Please see discharge summary)  AMBULATORY STATUS COMMUNICATION OF NEEDS Skin   Extensive Assist Verbally Other (Comment) (Ecchymosis,Abdomen,arm,lower,Bil.,Erythema,back,Buttocks,Upper,Bil.,Wound/Incision LDAs)                       Personal Care Assistance Level of Assistance  Bathing, Feeding,  Dressing Bathing Assistance: Maximum assistance   Dressing Assistance: Maximum assistance     Functional Limitations Info  Sight, Hearing, Speech Sight Info: Impaired Financial trader) Hearing Info: Adequate Speech Info: Adequate    SPECIAL CARE FACTORS FREQUENCY  PT (By licensed PT), OT (By licensed OT)     PT Frequency: 5x min weekly OT Frequency: 5x min weekly            Contractures Contractures Info: Not present    Additional Factors Info  Code Status, Allergies, Psychotropic Code Status Info: DNR Allergies Info: Aristocort (triamcinolone),Cortisone,Erythromycin,Other- Epinephrine in novocain makes heart race.,Hydrocodone-acetaminophen Psychtopic info: sertraline (Zoloft) tablet 50 mg,daily           Current Medications (07/15/2023):  This is the current hospital active medication list Current Facility-Administered Medications  Medication Dose Route Frequency Provider Last Rate Last Admin   acetaminophen (TYLENOL) tablet 650 mg  650 mg Oral Q6H PRN Orland Mustard, MD       Or   acetaminophen (TYLENOL) suppository 650 mg  650 mg Rectal Q6H PRN Orland Mustard, MD       docusate sodium (COLACE) capsule 100 mg  100 mg Oral BID Orland Mustard, MD   100 mg at 07/15/23 0844   enoxaparin (LOVENOX) injection 40 mg  40 mg Subcutaneous Q24H Azucena Fallen, MD   40 mg at 07/14/23 2210   ferrous sulfate tablet 325 mg  325 mg Oral Q breakfast Orland Mustard, MD   325 mg at 07/15/23 0844   influenza vaccine adjuvanted (FLUAD) injection 0.5 mL  0.5 mL Intramuscular Tomorrow-1000 Orland Mustard,  MD       ondansetron (ZOFRAN) tablet 4 mg  4 mg Oral Q6H PRN Orland Mustard, MD       Or   ondansetron [facility]) injection 4 mg  4 mg Intravenous Q6H PRN Orland Mustard, MD   4 mg at 07/12/23 2119   Oral care mouth rinse  15 mL Mouth Rinse PRN Orland Mustard, MD       pantoprazole (PROTONIX) EC tablet 40 mg  40 mg Oral BID Brahmbhatt, Parag, MD   40 mg at 07/15/23 0844   polyethylene  glycol (MIRALAX / GLYCOLAX) packet 17 g  17 g Oral Daily PRN Orland Mustard, MD       psyllium (HYDROCIL/METAMUCIL) 1 packet  1 packet Oral Daily Orland Mustard, MD   1 packet at 07/15/23 0844   sertraline (ZOLOFT) tablet 50 mg  50 mg Oral Daily Orland Mustard, MD   50 mg at 07/15/23 0844   traMADol (ULTRAM) tablet 50 mg  50 mg Oral Q8H PRN Orland Mustard, MD         Discharge Medications: Please see discharge summary for a list of discharge medications.  Relevant Imaging Results:  Relevant Lab Results:   Additional Information [id number]  Delilah Shan, LCSWA

## 2023-07-15 NOTE — Plan of Care (Signed)
Problem: Clinical Measurements: Goal: Ability to maintain clinical measurements within normal limits will improve Outcome: Progressing Goal: Will remain free from infection Outcome: Progressing Goal: Diagnostic test results will improve Outcome: Progressing Goal: Cardiovascular complication will be avoided Outcome: Progressing

## 2023-07-16 DIAGNOSIS — K922 Gastrointestinal hemorrhage, unspecified: Secondary | ICD-10-CM | POA: Diagnosis not present

## 2023-07-16 LAB — SURGICAL PATHOLOGY

## 2023-07-16 MED ORDER — PANTOPRAZOLE SODIUM 40 MG PO TBEC: 40.0000 mg | DELAYED_RELEASE_TABLET | Freq: Two times a day (BID) | ORAL | 0 refills | Status: DC

## 2023-07-16 NOTE — Discharge Summary (Addendum)
Physician Discharge Summary  Frances Ortiz ZOX:[id number] DOB: 06/27/1931 DOA: 07/12/2023  PCP: Eloisa Northern, MD  Admit date: 07/12/2023 Discharge date: 07/17/2023  Admitted From: SNF Disposition: SNF  Recommendations for Outpatient Follow-up:  Follow up with PCP in 1-2 weeks  Discharge Condition: Stable CODE STATUS: Full Diet recommendation: As tolerated  Brief/Interim Summary: Frances Ortiz is a 89 y.o. female with medical history significant of   hyperlipidemia, HTN, GERD, anxiety/depression, cecal cancer status post distant right Hemicolectomy presented to the ED with complaints of nausea and vomiting with notable hematemesis and questionably dark stool.  Of note patient had recent hip replacement and has been on Lovenox for DVT prophylaxis.  Hospitalist called for admit, GI called in consult.   Patient clinically improving given above, endoscopy did show tiny gastric ulcers and gastric erosion with polyps, biopsy taken.  No further signs or symptoms of bleeding.  Patient initially planned for discharge home.  She had been admitted from nursing facility but was supposedly "graduating" 07/15/2023, as such PT was asked to evaluate and noted the patient remains markedly weak with profound instability recommending further evaluation at SNF.  Patient otherwise medically stable for discharge -awaiting reauthorization and bed availability  Discharge Diagnoses:  Principal Problem:   Upper GI bleed Active Problems:   Pulmonary nodule   Acute blood loss anemia   Essential hypertension   Cecal cancer (HCC)   Hip fracture (HCC)   Anxiety and depression   GI bleed  Questionable upper GI bleed worsened by and related to use of Lovenox and NSAID Acute symptomatic anemia secondary to blood loss, presumed -No further episodes of hematemesis or hematochezia/melena -Continue to advance diet as tolerated -GI consulted, EGD shows tiny gastric ulcers and gastric erosions and gastric  polyps -Tolerated previous transfusion quite well, hemoglobin remains near baseline   Pulmonary nodule, incidental Right upper lobe recommended repeat non contrast CT chest at 6-12 months.    Essential hypertension Hold amlodipine, metoprolol -borderline hypotensive   Cecal cancer (HCC), history of, distant Cecal cancer s/p right colectomy in December of 2019.  Uneventful follow-up   Hip fracture (HCC), recent, stable Right hip fracture - ORIF by Dr. Dion Saucier on 06/22/23 Has been in rehab at SNF on lovenox for VTE prophylaxis -resume given resolution of above   Anxiety and depression Continue zoloft   Discharge Instructions   Allergies as of 07/17/2023       Reactions   Aristocort [triamcinolone] Other (See Comments)   Emotional instability   Cortisone Other (See Comments)   Emotional instability   Erythromycin Other (See Comments)   Stomach pain   Other Other (See Comments)   Epinephrine in novocain makes heart race.   Hydrocodone-acetaminophen Rash        Medication List     STOP taking these medications    amLODipine 5 MG tablet Commonly known as: NORVASC   metoprolol tartrate 50 MG tablet Commonly known as: LOPRESSOR       TAKE these medications    acetaminophen 650 MG CR tablet Commonly known as: TYLENOL Take 650 mg by mouth 3 (three) times daily as needed for pain.   bisacodyl 10 MG suppository Commonly known as: DULCOLAX Place 1 suppository (10 mg total) rectally daily as needed for moderate constipation.   docusate sodium 100 MG capsule Commonly known as: COLACE Take 1 capsule (100 mg total) by mouth 2 (two) times daily.   enoxaparin 40 MG/0.4ML injection Commonly known as: LOVENOX Inject 0.4 mLs (40 mg total)  into the skin daily for 30 doses. For 30 days post op for DVT prophylaxis   ferrous sulfate 325 (65 FE) MG tablet Take 1 tablet (325 mg total) by mouth daily with breakfast.   Metamucil Fiber Chew Chew 3 tablets by mouth daily.    pantoprazole 40 MG tablet Commonly known as: PROTONIX Take 1 tablet (40 mg total) by mouth 2 (two) times daily.   polyethylene glycol 17 g packet Commonly known as: MIRALAX / GLYCOLAX Take 17 g by mouth daily as needed for mild constipation.   sertraline 50 MG tablet Commonly known as: ZOLOFT Take 50 mg by mouth daily.   traMADol 50 MG tablet Commonly known as: ULTRAM Take 1 tablet (50 mg total) by mouth every 8 (eight) hours as needed for moderate pain.        Allergies  Allergen Reactions   Aristocort [Triamcinolone] Other (See Comments)    Emotional instability   Cortisone Other (See Comments)    Emotional instability   Erythromycin Other (See Comments)    Stomach pain    Other Other (See Comments)    Epinephrine in novocain makes heart race.   Hydrocodone-Acetaminophen Rash    Consultations: GI  Procedures/Studies: CT Angio Chest PE W and/or Wo Contrast  Result Date: 07/12/2023 CLINICAL DATA:  Nausea and vomiting EXAM: CT ANGIOGRAPHY CHEST CT ABDOMEN AND PELVIS WITH CONTRAST TECHNIQUE: Multidetector CT imaging of the chest was performed using the standard protocol during bolus administration of intravenous contrast. Multiplanar CT image reconstructions and MIPs were obtained to evaluate the vascular anatomy. Multidetector CT imaging of the abdomen and pelvis was performed using the standard protocol during bolus administration of intravenous contrast. RADIATION DOSE REDUCTION: This exam was performed according to the departmental dose-optimization program which includes automated exposure control, adjustment of the mA and/or kV according to patient size and/or use of iterative reconstruction technique. CONTRAST:  75mL OMNIPAQUE IOHEXOL 350 MG/ML SOLN COMPARISON:  None Available. FINDINGS: CTA CHEST FINDINGS Cardiovascular: No evidence of pulmonary embolus. Normal heart size. No pericardial effusion. Normal caliber thoracic aorta with mild atherosclerotic disease. Severe  coronary artery calcifications. Mildly dilated main pulmonary artery, measuring up to 3.2 cm. Mediastinum/Nodes: Small hiatal hernia. Thyroid is unremarkable. No enlarged lymph nodes seen in the chest. Lungs/Pleura: Central airways are patent. Bilateral mosaic attenuation. Solid pulmonary nodule of the posterior right upper lobe measuring 6 mm on series 4, image 34. Musculoskeletal: No chest wall abnormality. No acute or significant osseous findings. Review of the MIP images confirms the above findings. CT ABDOMEN and PELVIS FINDINGS Hepatobiliary: No suspicious liver lesion. Prior cholecystectomy. Slightly increased common bile duct dilation, measuring up to 9 mm, previously 7 mm. New mild intrahepatic biliary ductal dilation. Pancreas: Unremarkable. No pancreatic ductal dilatation or surrounding inflammatory changes. Spleen: Normal in size without focal abnormality. Adrenals/Urinary Tract: Unchanged right adrenal gland adenoma measuring up to 1.6 cm, consistent with benign adenoma given long-term stability, no specific follow-up imaging is necessary. Bilateral left-greater-than-right parapelvic cysts. Additional bilateral low-attenuation renal lesions, largest are consistent with simple cysts, others are too small to accurately characterize, no specific follow-up imaging is necessary. Punctate left-greater-than-right nonobstructing renal stones. Bladder is unremarkable. Stomach/Bowel: Stomach is within normal limits. Small duodenal diverticulum. Prior ileocecectomy. Diverticulosis. No evidence of bowel wall thickening, distention, or inflammatory changes. Vascular/Lymphatic: Mild aortic atherosclerosis. No enlarged abdominal or pelvic lymph nodes. Reproductive: Limits evaluation due to streak artifact. Uterus is unremarkable. Fluid noted in the vaginal cuff. Other: No abdominal wall hernia or abnormality. No  abdominopelvic ascites. Musculoskeletal: Prior right hip replacement. Multilevel compression deformities of  the lumbar spine with kyphoplasty changes at L2-L3. Unchanged mild anterolisthesis of L4 on L5. Review of the MIP images confirms the above findings. IMPRESSION: Chest CTA: 1. No evidence of pulmonary embolus. 2. Bilateral mosaic attenuation, which can be seen in the setting of small airways disease or pulmonary hypertension. 3. Solid pulmonary nodule of the posterior right upper lobe measuring 6 mm. Non-contrast chest CT at 6-12 months is recommended. If the nodule is stable at time of repeat CT, then future CT at 18-24 months (from today's scan) is considered optional for low-risk patients, but is recommended for high-risk patients. This recommendation follows the consensus statement: Guidelines for Management of Incidental Pulmonary Nodules Detected on CT Images: From the Fleischner Society 2017; Radiology 2017; 284:228-243. 4. Coronary artery calcifications Abdomen and pelvis CT: 1. Slightly increased common bile duct dilation, measuring up to 9 mm, previously 7 mm. New mild intrahepatic biliary ductal dilation. Correlate with liver function tests. If LFTs are abnormal, consider further evaluation with MRCP. 2. Aortic Atherosclerosis (ICD10-I70.0). Electronically Signed   By: Allegra Lai M.D.   On: 07/12/2023 13:18   CT ABDOMEN PELVIS W CONTRAST  Result Date: 07/12/2023 CLINICAL DATA:  Nausea and vomiting EXAM: CT ANGIOGRAPHY CHEST CT ABDOMEN AND PELVIS WITH CONTRAST TECHNIQUE: Multidetector CT imaging of the chest was performed using the standard protocol during bolus administration of intravenous contrast. Multiplanar CT image reconstructions and MIPs were obtained to evaluate the vascular anatomy. Multidetector CT imaging of the abdomen and pelvis was performed using the standard protocol during bolus administration of intravenous contrast. RADIATION DOSE REDUCTION: This exam was performed according to the departmental dose-optimization program which includes automated exposure control, adjustment of  the mA and/or kV according to patient size and/or use of iterative reconstruction technique. CONTRAST:  75mL OMNIPAQUE IOHEXOL 350 MG/ML SOLN COMPARISON:  None Available. FINDINGS: CTA CHEST FINDINGS Cardiovascular: No evidence of pulmonary embolus. Normal heart size. No pericardial effusion. Normal caliber thoracic aorta with mild atherosclerotic disease. Severe coronary artery calcifications. Mildly dilated main pulmonary artery, measuring up to 3.2 cm. Mediastinum/Nodes: Small hiatal hernia. Thyroid is unremarkable. No enlarged lymph nodes seen in the chest. Lungs/Pleura: Central airways are patent. Bilateral mosaic attenuation. Solid pulmonary nodule of the posterior right upper lobe measuring 6 mm on series 4, image 34. Musculoskeletal: No chest wall abnormality. No acute or significant osseous findings. Review of the MIP images confirms the above findings. CT ABDOMEN and PELVIS FINDINGS Hepatobiliary: No suspicious liver lesion. Prior cholecystectomy. Slightly increased common bile duct dilation, measuring up to 9 mm, previously 7 mm. New mild intrahepatic biliary ductal dilation. Pancreas: Unremarkable. No pancreatic ductal dilatation or surrounding inflammatory changes. Spleen: Normal in size without focal abnormality. Adrenals/Urinary Tract: Unchanged right adrenal gland adenoma measuring up to 1.6 cm, consistent with benign adenoma given long-term stability, no specific follow-up imaging is necessary. Bilateral left-greater-than-right parapelvic cysts. Additional bilateral low-attenuation renal lesions, largest are consistent with simple cysts, others are too small to accurately characterize, no specific follow-up imaging is necessary. Punctate left-greater-than-right nonobstructing renal stones. Bladder is unremarkable. Stomach/Bowel: Stomach is within normal limits. Small duodenal diverticulum. Prior ileocecectomy. Diverticulosis. No evidence of bowel wall thickening, distention, or inflammatory changes.  Vascular/Lymphatic: Mild aortic atherosclerosis. No enlarged abdominal or pelvic lymph nodes. Reproductive: Limits evaluation due to streak artifact. Uterus is unremarkable. Fluid noted in the vaginal cuff. Other: No abdominal wall hernia or abnormality. No abdominopelvic ascites. Musculoskeletal: Prior right hip replacement.  Multilevel compression deformities of the lumbar spine with kyphoplasty changes at L2-L3. Unchanged mild anterolisthesis of L4 on L5. Review of the MIP images confirms the above findings. IMPRESSION: Chest CTA: 1. No evidence of pulmonary embolus. 2. Bilateral mosaic attenuation, which can be seen in the setting of small airways disease or pulmonary hypertension. 3. Solid pulmonary nodule of the posterior right upper lobe measuring 6 mm. Non-contrast chest CT at 6-12 months is recommended. If the nodule is stable at time of repeat CT, then future CT at 18-24 months (from today's scan) is considered optional for low-risk patients, but is recommended for high-risk patients. This recommendation follows the consensus statement: Guidelines for Management of Incidental Pulmonary Nodules Detected on CT Images: From the Fleischner Society 2017; Radiology 2017; 284:228-243. 4. Coronary artery calcifications Abdomen and pelvis CT: 1. Slightly increased common bile duct dilation, measuring up to 9 mm, previously 7 mm. New mild intrahepatic biliary ductal dilation. Correlate with liver function tests. If LFTs are abnormal, consider further evaluation with MRCP. 2. Aortic Atherosclerosis (ICD10-I70.0). Electronically Signed   By: Allegra Lai M.D.   On: 07/12/2023 13:18   DG HIP UNILAT W OR W/O PELVIS 2-3 VIEWS RIGHT  Result Date: 06/22/2023 CLINICAL DATA:  Status post right hip cemented hemiarthroplasty for right hip fracture. EXAM: DG HIP (WITH OR WITHOUT PELVIS) 2-3V RIGHT COMPARISON:  Right hip radiographs 06/21/2023, CT right hip 06/21/2023 FINDINGS: Interval right hip hemiarthroplasty. No  perihardware lucency is seen to be to indicate hardware failure or loosening. Expected postoperative right hip subcutaneous air. Mild bilateral sacroiliac subchondral sclerosis. Mild left superomedial femoroacetabular joint space narrowing. IMPRESSION: Interval right hip hemiarthroplasty without evidence of hardware failure or loosening. Electronically Signed   By: Neita Garnet M.D.   On: 06/22/2023 11:43   CT HIP RIGHT WO CONTRAST  Result Date: 06/21/2023 CLINICAL DATA:  Right femoral neck fracture.  Fell while walking. EXAM: CT OF THE RIGHT HIP WITHOUT CONTRAST TECHNIQUE: Multidetector CT imaging of the right hip was performed according to the standard protocol. Multiplanar CT image reconstructions were also generated. RADIATION DOSE REDUCTION: This exam was performed according to the departmental dose-optimization program which includes automated exposure control, adjustment of the mA and/or kV according to patient size and/or use of iterative reconstruction technique. COMPARISON:  Right hip x-rays from same day. FINDINGS: Bones/Joint/Cartilage Acute impacted right subcapital femoral neck fracture. No dislocation. Mild right hip joint space narrowing with small marginal osteophytes. No significant joint effusion. Ligaments Ligaments are suboptimally evaluated by CT. Muscles and Tendons Grossly intact.  Gluteus minimus and medius muscle atrophy. Soft tissue Soft tissue swelling along the posterolateral hip with small 2.0 x 1.6 x 1.8 cm hematoma. No soft tissue mass. Small fat containing right inguinal hernia. IMPRESSION: 1. Acute impacted right subcapital femoral neck fracture. 2. Soft tissue swelling along the posterolateral hip with small 2.0 cm hematoma. Electronically Signed   By: Obie Dredge M.D.   On: 06/21/2023 07:52   DG Knee Right Port  Result Date: 06/21/2023 CLINICAL DATA:  Right hip fracture after ground level fall. EXAM: PORTABLE RIGHT KNEE - 1-2 VIEW COMPARISON:  None Available.  FINDINGS: Osteopenia. There is no joint effusion. There is no sign of acute fracture or dislocation. Soft tissues are unremarkable. IMPRESSION: 1. No acute osseous findings. 2. Osteopenia. Electronically Signed   By: Signa Kell M.D.   On: 06/21/2023 07:52   DG HIP UNILAT WITH PELVIS 2-3 VIEWS RIGHT  Result Date: 06/21/2023 CLINICAL DATA:  [id number] Fall [id number]  EXAM: DG HIP (WITH OR WITHOUT PELVIS) 2-3V RIGHT COMPARISON:  CT abdomen pelvis 10/02/2017. FINDINGS: Shortening of the right femoral neck with associated poorly visualized lucency along the neck/head consistent with an acute fracture. No right hip dislocation peer frontal view of the left hip demonstrates no acute fracture or dislocation. Acute displaced fracture or diastasis of the bones of the pelvis. There is no evidence of severe arthropathy or other focal bone abnormality. Kyphoplasty noted of the lumbar spine. IMPRESSION: Acute right femoral neck/neck fracture poorly visualized. Electronically Signed   By: Tish Frederickson M.D.   On: 06/21/2023 03:16   DG Chest Port 1 View  Result Date: 06/21/2023 CLINICAL DATA:  fall EXAM: PORTABLE CHEST 1 VIEW COMPARISON:  CT abdomen pelvis 10/02/2017 FINDINGS: The heart and mediastinal contours are within normal limits. Aortic calcification. Hyperinflation of the lungs. No focal consolidation. Coarsened interstitial markings with no overt pulmonary edema. No pleural effusion. No pneumothorax. No acute osseous abnormality. IMPRESSION: 1. No active disease. 2. Aortic Atherosclerosis (ICD10-I70.0) and Emphysema (ICD10-J43.9). Electronically Signed   By: Tish Frederickson M.D.   On: 06/21/2023 03:14   CT HEAD WO CONTRAST ( )  Result Date: 06/21/2023 CLINICAL DATA:  Polytrauma, blunt EXAM: CT HEAD WITHOUT CONTRAST CT CERVICAL SPINE WITHOUT CONTRAST TECHNIQUE: Multidetector CT imaging of the head and cervical spine was performed following the standard protocol without intravenous contrast. Multiplanar CT image  reconstructions of the cervical spine were also generated. RADIATION DOSE REDUCTION: This exam was performed according to the departmental dose-optimization program which includes automated exposure control, adjustment of the mA and/or kV according to patient size and/or use of iterative reconstruction technique. COMPARISON:  CT head 12/09/2022 FINDINGS: CT HEAD FINDINGS Brain: Cerebral ventricle sizes are concordant with the degree of cerebral volume loss. Patchy and confluent areas of decreased attenuation are noted throughout the deep and periventricular white matter of the cerebral hemispheres bilaterally, compatible with chronic microvascular ischemic disease. No evidence of large-territorial acute infarction. No parenchymal hemorrhage. No mass lesion. No extra-axial collection. No mass effect or midline shift. No hydrocephalus. Basilar cisterns are patent. Vascular: No hyperdense vessel. Skull: No acute fracture or focal lesion. Sinuses/Orbits: Right sphenoid sinus mucosal thickening with calcifications. Paranasal sinuses and mastoid air cells are clear. Bilateral lens replacement. Otherwise the orbits are unremarkable. Other: None. CT CERVICAL SPINE FINDINGS Alignment: Grade 1 anterolisthesis of C3 on C4 and C4 on C5. Skull base and vertebrae: Multilevel moderate severe degenerative changes spine most prominent at the C6-C7 level. Associated multilevel severe osseous neural foraminal stenosis. No severe osseous central canal stenosis. No acute fracture. No aggressive appearing focal osseous lesion or focal pathologic process. Soft tissues and spinal canal: No prevertebral fluid or swelling. No visible canal hematoma. Upper chest: Unremarkable. Other: None. IMPRESSION: 1. No acute intracranial abnormality. 2. No acute displaced fracture or traumatic listhesis of the cervical spine. 3. Right sphenoid sinus mucosal thickening with calcifications. Findings suggestive of chronic versus fungal sinusitis.  Electronically Signed   By: Tish Frederickson M.D.   On: 06/21/2023 03:12   CT Cervical Spine Wo Contrast  Result Date: 06/21/2023 CLINICAL DATA:  Polytrauma, blunt EXAM: CT HEAD WITHOUT CONTRAST CT CERVICAL SPINE WITHOUT CONTRAST TECHNIQUE: Multidetector CT imaging of the head and cervical spine was performed following the standard protocol without intravenous contrast. Multiplanar CT image reconstructions of the cervical spine were also generated. RADIATION DOSE REDUCTION: This exam was performed according to the departmental dose-optimization program which includes automated exposure control, adjustment of the mA and/or kV  according to patient size and/or use of iterative reconstruction technique. COMPARISON:  CT head 12/09/2022 FINDINGS: CT HEAD FINDINGS Brain: Cerebral ventricle sizes are concordant with the degree of cerebral volume loss. Patchy and confluent areas of decreased attenuation are noted throughout the deep and periventricular white matter of the cerebral hemispheres bilaterally, compatible with chronic microvascular ischemic disease. No evidence of large-territorial acute infarction. No parenchymal hemorrhage. No mass lesion. No extra-axial collection. No mass effect or midline shift. No hydrocephalus. Basilar cisterns are patent. Vascular: No hyperdense vessel. Skull: No acute fracture or focal lesion. Sinuses/Orbits: Right sphenoid sinus mucosal thickening with calcifications. Paranasal sinuses and mastoid air cells are clear. Bilateral lens replacement. Otherwise the orbits are unremarkable. Other: None. CT CERVICAL SPINE FINDINGS Alignment: Grade 1 anterolisthesis of C3 on C4 and C4 on C5. Skull base and vertebrae: Multilevel moderate severe degenerative changes spine most prominent at the C6-C7 level. Associated multilevel severe osseous neural foraminal stenosis. No severe osseous central canal stenosis. No acute fracture. No aggressive appearing focal osseous lesion or focal pathologic  process. Soft tissues and spinal canal: No prevertebral fluid or swelling. No visible canal hematoma. Upper chest: Unremarkable. Other: None. IMPRESSION: 1. No acute intracranial abnormality. 2. No acute displaced fracture or traumatic listhesis of the cervical spine. 3. Right sphenoid sinus mucosal thickening with calcifications. Findings suggestive of chronic versus fungal sinusitis. Electronically Signed   By: Tish Frederickson M.D.   On: 06/21/2023 03:12     Subjective: No acute issues or events overnight   Discharge Exam: Vitals:   07/16/23 2000 07/17/23 0606  BP: (!) 104/57 134/69  Pulse: (!) 105 85  Resp: 16 16  Temp: 98.8 F (37.1 C) 99 F (37.2 C)  SpO2: 97% 96%   Vitals:   07/16/23 0713 07/16/23 1142 07/16/23 2000 07/17/23 0606  BP: (!) 130/56 128/64 (!) 104/57 134/69  Pulse: 86 (!) 106 (!) 105 85  Resp: 16 16 16 16   Temp: 98.6 F (37 C) 98.6 F (37 C) 98.8 F (37.1 C) 99 F (37.2 C)  TempSrc: Oral Oral Oral Oral  SpO2: 94% 90% 97% 96%  Weight:      Height:        General: Pt is alert, awake, not in acute distress Cardiovascular: RRR, S1/S2 +, no rubs, no gallops Respiratory: CTA bilaterally, no wheezing, no rhonchi Abdominal: Soft, NT, ND, bowel sounds + Extremities: no edema, no cyanosis    The results of significant diagnostics from this hospitalization (including imaging, microbiology, ancillary and laboratory) are listed below for reference.     Microbiology: Recent Results (from the past 240 hour(s))  Resp panel by RT-PCR (RSV, Flu A&B, Covid) Anterior Nasal Swab     Status: None   Collection Time: 07/12/23 10:03 AM   Specimen: Anterior Nasal Swab  Result Value Ref Range Status   SARS Coronavirus 2 by RT PCR NEGATIVE NEGATIVE Final   Influenza A by PCR NEGATIVE NEGATIVE Final   Influenza B by PCR NEGATIVE NEGATIVE Final    Comment: (NOTE) The Xpert Xpress SARS-CoV-2/FLU/RSV plus assay is intended as an aid in the diagnosis of influenza from  Nasopharyngeal swab specimens and should not be used as a sole basis for treatment. Nasal washings and aspirates are unacceptable for Xpert Xpress SARS-CoV-2/FLU/RSV testing.  Fact Sheet for Patients: BloggerCourse.com  Fact Sheet for Healthcare Providers: SeriousBroker.it  This test is not yet approved or cleared by the [place] FDA and has been authorized for detection and/or diagnosis of SARS-CoV-2 by  FDA under an Emergency Use Authorization (EUA). This EUA will remain in effect (meaning this test can be used) for the duration of the COVID-19 declaration under Section 564(b)(1) of the Act, 21 U.S.C. section 360bbb-3(b)(1), unless the authorization is terminated or revoked.     Resp Syncytial Virus by PCR NEGATIVE NEGATIVE Final    Comment: (NOTE) Fact Sheet for Patients: BloggerCourse.com  Fact Sheet for Healthcare Providers: SeriousBroker.it  This test is not yet approved or cleared by the [place] FDA and has been authorized for detection and/or diagnosis of SARS-CoV-2 by FDA under an Emergency Use Authorization (EUA). This EUA will remain in effect (meaning this test can be used) for the duration of the COVID-19 declaration under Section 564(b)(1) of the Act, 21 U.S.C. section 360bbb-3(b)(1), unless the authorization is terminated or revoked.  Performed at [facility], [address] [place], [place] [address]      Labs: BNP (last 3 results) Recent Labs    06/22/23 1209 06/23/23 0625 06/24/23 0235  BNP 222.5* 190.5* 167.9*   Basic Metabolic Panel: Recent Labs  Lab 07/12/23 0955 07/13/23 0649  NA 141 141  K 3.9 3.6  CL 103 107  CO2 27 24  GLUCOSE 125* 110*  BUN 42* 24*  CREATININE 0.60 0.49  CALCIUM 9.0 8.6*   Liver Function Tests: Recent Labs  Lab 07/12/23 0955  AST 18  ALT 12  ALKPHOS 75  BILITOT 0.5  PROT 6.3*  ALBUMIN  3.2*   Recent Labs  Lab 07/12/23 0955  LIPASE 28   CBC: Recent Labs  Lab 07/12/23 0955 07/12/23 1916 07/13/23 0047 07/13/23 0649 07/14/23 0446 07/15/23 0434  WBC 12.9* 12.3* 14.1* 11.2* 9.4 8.2  NEUTROABS 10.4*  --   --   --   --   --   HGB 8.7* 7.3* 7.4* 9.1* 8.1* 8.8*  HCT 27.9* 23.6* 23.4* 27.8* 25.4* 27.5*  MCV 98.2 99.2 99.2 96.9 96.2 95.8  PLT 484* 407* 422* 382 350 384   Urinalysis    Component Value Date/Time   COLORURINE YELLOW 07/13/2023 0035   APPEARANCEUR CLEAR 07/13/2023 0035   LABSPEC 1.018 07/13/2023 0035   PHURINE 5.0 07/13/2023 0035   GLUCOSEU NEGATIVE 07/13/2023 0035   HGBUR NEGATIVE 07/13/2023 0035   BILIRUBINUR NEGATIVE 07/13/2023 0035   KETONESUR 20 (A) 07/13/2023 0035   PROTEINUR NEGATIVE 07/13/2023 0035   NITRITE NEGATIVE 07/13/2023 0035   LEUKOCYTESUR NEGATIVE 07/13/2023 0035   Sepsis Labs Recent Labs  Lab 07/13/23 0047 07/13/23 0649 07/14/23 0446 07/15/23 0434  WBC 14.1* 11.2* 9.4 8.2   Microbiology Recent Results (from the past 240 hour(s))  Resp panel by RT-PCR (RSV, Flu A&B, Covid) Anterior Nasal Swab     Status: None   Collection Time: 07/12/23 10:03 AM   Specimen: Anterior Nasal Swab  Result Value Ref Range Status   SARS Coronavirus 2 by RT PCR NEGATIVE NEGATIVE Final   Influenza A by PCR NEGATIVE NEGATIVE Final   Influenza B by PCR NEGATIVE NEGATIVE Final    Comment: (NOTE) The Xpert Xpress SARS-CoV-2/FLU/RSV plus assay is intended as an aid in the diagnosis of influenza from Nasopharyngeal swab specimens and should not be used as a sole basis for treatment. Nasal washings and aspirates are unacceptable for Xpert Xpress SARS-CoV-2/FLU/RSV testing.  Fact Sheet for Patients: BloggerCourse.com  Fact Sheet for Healthcare Providers: SeriousBroker.it  This test is not yet approved or cleared by the [place] FDA and has been authorized for detection and/or diagnosis of  SARS-CoV-2 by FDA under an Emergency Use Authorization (EUA). This EUA will remain in effect (meaning this test can be used) for the duration of the COVID-19 declaration under Section 564(b)(1) of the Act, 21 U.S.C. section 360bbb-3(b)(1), unless the authorization is terminated or revoked.     Resp Syncytial Virus by PCR NEGATIVE NEGATIVE Final    Comment: (NOTE) Fact Sheet for Patients: BloggerCourse.com  Fact Sheet for Healthcare Providers: SeriousBroker.it  This test is not yet approved or cleared by the [place] FDA and has been authorized for detection and/or diagnosis of SARS-CoV-2 by FDA under an Emergency Use Authorization (EUA). This EUA will remain in effect (meaning this test can be used) for the duration of the COVID-19 declaration under Section 564(b)(1) of the Act, 21 U.S.C. section 360bbb-3(b)(1), unless the authorization is terminated or revoked.  Performed at [facility], [address], [place], [place] [address]      Time coordinating discharge: Over 30 minutes  SIGNED: Chart updated simply to reflect proper date of discharge. Remainder of note remains as composed by Dr. Carma Leaven on 07/16/2023.  Lonia Blood, MD [facility] 07/17/2023, 11:06 AM Pager   If 7PM-7AM, please contact night-coverage [email or web address]

## 2023-07-16 NOTE — Anesthesia Postprocedure Evaluation (Signed)
Anesthesia Post Note  Patient: Frances Ortiz  Procedure(s) Performed: ESOPHAGOGASTRODUODENOSCOPY (EGD) WITH PROPOFOL BIOPSY     Patient location during evaluation: Endoscopy Anesthesia Type: MAC Level of consciousness: awake and alert Pain management: pain level controlled Vital Signs Assessment: post-procedure vital signs reviewed and stable Respiratory status: spontaneous breathing, nonlabored ventilation, respiratory function stable and patient connected to nasal cannula oxygen Cardiovascular status: stable and blood pressure returned to baseline Postop Assessment: no apparent nausea or vomiting Anesthetic complications: no   No notable events documented.  Last Vitals:  Vitals:   07/16/23 0436 07/16/23 0713  BP: (!) 149/70 (!) 130/56  Pulse:  86  Resp: 15 16  Temp: 36.5 C 37 C  SpO2:  94%    Last Pain:  Vitals:   07/16/23 0713  TempSrc: Oral  PainSc:                  Javin Nong S

## 2023-07-16 NOTE — TOC Progression Note (Addendum)
Transition of Care [facility]) - Progression Note    Patient Details  Name: Frances Ortiz MRN: [id number] Date of Birth: [birth year]  [facility]) CM/SW Contact  Erin Sons, [place] Phone Number: 07/16/2023, 10:43 AM  Clinical Narrative:     CSW met with pt bedside and provided SNF bed offers. [facility] does not have beds available. Pt states she would like to return to [facility] since [facility] does not have beds. CSW confirmed with [facility] that pt can return once [place] received.   CSW submitted snf auth request; status is pending. [id number]#[id number]  1330: CSW is informed by RN that pt's daughter requesting call. Daughter stating that [facility] may have a bed open later in the week and would like pt to go there. CSW contacted [facility] liaison who states bed availability is based on discharges that they have; currently unclear when a bed may be available. [facility] is also still reviewing pt's referral to determine if they can clinically accept/manage pt. [facility] liaison will update CSW with decision.   CSW called and spoke with daughter to update. She is understanding that pt cannot be held if medically stable and a safe DC plan is available. She is hopeful that pt can go to [facility] but understands if bed unavailable at time of DC.   Berkley Harvey is still currently pending for [employer].   Expected Discharge Plan: Skilled Nursing Facility Barriers to Discharge: Insurance Authorization  Expected Discharge Plan and Services In-house Referral: Clinical Social Work     Living arrangements for the past 2 months:  (from [facility] short term rehab usually lives at home alone)                                       Social Determinants of Health (SDOH) Interventions SDOH Screenings   Food Insecurity: No Food Insecurity (07/12/2023)  Housing: Patient Declined (07/12/2023)  Transportation Needs: No Transportation Needs (07/12/2023)  Utilities: Not At Risk (07/12/2023)  Tobacco  Use: Low Risk  (07/12/2023)    Readmission Risk Interventions     No data to display

## 2023-07-17 DIAGNOSIS — D62 Acute posthemorrhagic anemia: Secondary | ICD-10-CM | POA: Diagnosis not present

## 2023-07-17 DIAGNOSIS — R000 Tachycardia, unspecified: Secondary | ICD-10-CM | POA: Diagnosis not present

## 2023-07-17 DIAGNOSIS — S71001A Unspecified open wound, right hip, initial encounter: Secondary | ICD-10-CM | POA: Diagnosis not present

## 2023-07-17 DIAGNOSIS — D500 Iron deficiency anemia secondary to blood loss (chronic): Secondary | ICD-10-CM | POA: Diagnosis not present

## 2023-07-17 DIAGNOSIS — F419 Anxiety disorder, unspecified: Secondary | ICD-10-CM | POA: Diagnosis not present

## 2023-07-17 DIAGNOSIS — R911 Solitary pulmonary nodule: Secondary | ICD-10-CM | POA: Diagnosis not present

## 2023-07-17 DIAGNOSIS — K279 Peptic ulcer, site unspecified, unspecified as acute or chronic, without hemorrhage or perforation: Secondary | ICD-10-CM | POA: Diagnosis not present

## 2023-07-17 DIAGNOSIS — I10 Essential (primary) hypertension: Secondary | ICD-10-CM | POA: Diagnosis not present

## 2023-07-17 DIAGNOSIS — M48061 Spinal stenosis, lumbar region without neurogenic claudication: Secondary | ICD-10-CM | POA: Diagnosis not present

## 2023-07-17 DIAGNOSIS — K219 Gastro-esophageal reflux disease without esophagitis: Secondary | ICD-10-CM | POA: Diagnosis not present

## 2023-07-17 DIAGNOSIS — M6281 Muscle weakness (generalized): Secondary | ICD-10-CM | POA: Diagnosis not present

## 2023-07-17 DIAGNOSIS — Z7401 Bed confinement status: Secondary | ICD-10-CM | POA: Diagnosis not present

## 2023-07-17 DIAGNOSIS — R58 Hemorrhage, not elsewhere classified: Secondary | ICD-10-CM | POA: Diagnosis not present

## 2023-07-17 DIAGNOSIS — R2689 Other abnormalities of gait and mobility: Secondary | ICD-10-CM | POA: Diagnosis not present

## 2023-07-17 DIAGNOSIS — Z9189 Other specified personal risk factors, not elsewhere classified: Secondary | ICD-10-CM | POA: Diagnosis not present

## 2023-07-17 DIAGNOSIS — Z471 Aftercare following joint replacement surgery: Secondary | ICD-10-CM | POA: Diagnosis not present

## 2023-07-17 DIAGNOSIS — F32A Depression, unspecified: Secondary | ICD-10-CM | POA: Diagnosis not present

## 2023-07-17 DIAGNOSIS — K922 Gastrointestinal hemorrhage, unspecified: Secondary | ICD-10-CM | POA: Diagnosis not present

## 2023-07-17 DIAGNOSIS — S72011D Unspecified intracapsular fracture of right femur, subsequent encounter for closed fracture with routine healing: Secondary | ICD-10-CM | POA: Diagnosis not present

## 2023-07-17 NOTE — TOC Transition Note (Signed)
Transition of Care [facility]) - CM/SW Discharge Note   Patient Details  Name: Frances Ortiz MRN: [id number] Date of Birth: [birth year]  Transition of Care [facility]) CM/SW Contact:  Delilah Shan, LCSWA Phone Number: 07/17/2023, 11:38 AM   Clinical Narrative:     Patient will DC to: [facility]   Anticipated DC date: 07/17/2023  Family notified: Community education officer by: Sharin Mons  ?  Per MD patient ready for DC to [facility] . RN, patient, patient's family, and facility notified of DC. Discharge Summary sent to facility. RN given number for report tele# [phone] RM# [id number]. DC packet on chart.DNR signed by MD attached to patients DC packet. Ambulance transport requested for patient.  CSW signing off.   Final next level of care: Skilled Nursing Facility Barriers to Discharge: No Barriers Identified   Patient Goals and CMS Choice CMS Medicare.gov Compare Post Acute Care list provided to:: Patient Choice offered to / list presented to : Patient  Discharge Placement                Patient chooses bed at: [facility] Patient to be transferred to facility by: [facility] Name of family member notified: [name] Patient and family notified of of transfer: 07/17/23  Discharge Plan and Services Additional resources added to the After Visit Summary for   In-house Referral: Clinical Social Work                                   Social Determinants of Health (SDOH) Interventions SDOH Screenings   Food Insecurity: No Food Insecurity (07/12/2023)  Housing: Patient Declined (07/12/2023)  Transportation Needs: No Transportation Needs (07/12/2023)  Utilities: Not At Risk (07/12/2023)  Tobacco Use: Low Risk  (07/12/2023)     Readmission Risk Interventions     No data to display

## 2023-07-17 NOTE — TOC Progression Note (Signed)
Transition of Care [facility]) - Progression Note    Patient Details  Name: Frances Ortiz MRN: [id number] Date of Birth: 11/17/1930  Transition of Care [facility]) CM/SW Contact  Delilah Shan, LCSWA Phone Number: 07/17/2023, 10:22 AM  Clinical Narrative:     Patients insurance authorization has been approved. Reference # [id number]. Insurance authorization has been approved from 9/24-9/26. CSW will continue to follow and assist with patients dc planning needs.  Expected Discharge Plan: Skilled Nursing Facility Barriers to Discharge: Insurance Authorization  Expected Discharge Plan and Services In-house Referral: Clinical Social Work     Living arrangements for the past 2 months:  (from [facility] short term rehab usually lives at home alone)                                       Social Determinants of Health (SDOH) Interventions SDOH Screenings   Food Insecurity: No Food Insecurity (07/12/2023)  Housing: Patient Declined (07/12/2023)  Transportation Needs: No Transportation Needs (07/12/2023)  Utilities: Not At Risk (07/12/2023)  Tobacco Use: Low Risk  (07/12/2023)    Readmission Risk Interventions     No data to display

## 2023-07-17 NOTE — Progress Notes (Signed)
Frances Ortiz  ZOX:[id number] DOB: 12/11/1930 DOA: 07/12/2023 PCP: Eloisa Northern, MD    Brief Narrative:  89 y.o. female with medical history significant of   hyperlipidemia, HTN, GERD, anxiety/depression, cecal cancer status post distant right Hemicolectomy presented to the ED with complaints of nausea and vomiting with notable hematemesis and questionably dark stool.  Of note patient had recent hip replacement and has been on Lovenox for DVT prophylaxis.  Hospitalist called for admit, GI called in consult.   Patient clinically improving given above, endoscopy did show tiny gastric ulcers and gastric erosion with polyps, biopsy taken.  No further signs or symptoms of bleeding.  Patient initially planned for discharge home.  She had been admitted from nursing facility but was supposedly "graduating" 07/15/2023, as such PT was asked to evaluate and noted the patient remains markedly weak with profound instability recommending further evaluation at SNF.  Goals of Care:   Code Status: Limited: Do not attempt resuscitation (DNR) -DNR-LIMITED -Do Not Intubate/DNI    DVT prophylaxis: enoxaparin (LOVENOX) injection 40 mg Start: 07/14/23 2200 SCDs Start: 07/12/23 1603   Interim Hx: No acute events reported overnight.  Afebrile.  Vital signs stable.  Assessment & Plan:  The patient is medically clear for discharge to an SNF.  There have been no changes in her condition since the time of her discharge summary as composed by Dr. Natale Milch 07/16/2023.  An order for discharge to SNF has been entered this morning.  Objective: Blood pressure 134/69, pulse 85, temperature 99 F (37.2 C), temperature source Oral, resp. rate 16, height 4\' 10"  (1.473 m), weight 54.4 kg, SpO2 96%. No intake or output data in the 24 hours ending 07/17/23 1038 Filed Weights   07/12/23 0940 07/14/23 0924  Weight: 55.3 kg 54.4 kg   CBC: Recent Labs  Lab 07/12/23 0955 07/12/23 1916 07/13/23 0649 07/14/23 0446  07/15/23 0434  WBC 12.9*   < > 11.2* 9.4 8.2  NEUTROABS 10.4*  --   --   --   --   HGB 8.7*   < > 9.1* 8.1* 8.8*  HCT 27.9*   < > 27.8* 25.4* 27.5*  MCV 98.2   < > 96.9 96.2 95.8  PLT 484*   < > 382 350 384   < > = values in this interval not displayed.   Basic Metabolic Panel: Recent Labs  Lab 07/12/23 0955 07/13/23 0649  NA 141 141  K 3.9 3.6  CL 103 107  CO2 27 24  GLUCOSE 125* 110*  BUN 42* 24*  CREATININE 0.60 0.49  CALCIUM 9.0 8.6*   GFR: Estimated Creatinine Clearance: 34.2 mL/min (by C-G formula based on SCr of 0.49 mg/dL).   Scheduled Meds:  docusate sodium  100 mg Oral BID   enoxaparin  40 mg Subcutaneous Q24H   ferrous sulfate  325 mg Oral Q breakfast   influenza vaccine adjuvanted  0.5 mL Intramuscular Tomorrow-1000   pantoprazole  40 mg Oral BID   psyllium  1 packet Oral Daily   sertraline  50 mg Oral Daily      LOS: 4 days   Lonia Blood, MD [facility] Office  [phone] Pager - Text Page per Loretha Stapler  If 7PM-7AM, please contact night-coverage per Amion 07/17/2023, 10:38 AM

## 2023-07-17 NOTE — Plan of Care (Signed)
  Problem: Education: Goal: Verbalization of understanding the information provided (i.e., activity precautions, restrictions, etc) will improve Outcome: Progressing Goal: Individualized Educational Video(s) Outcome: Progressing   Problem: Activity: Goal: Ability to ambulate and perform ADLs will improve Outcome: Progressing   Problem: Clinical Measurements: Goal: Postoperative complications will be avoided or minimized Outcome: Progressing   Problem: Self-Concept: Goal: Ability to maintain and perform role responsibilities to the fullest extent possible will improve Outcome: Progressing   Problem: Pain Management: Goal: Pain level will decrease Outcome: Progressing   Problem: Education: Goal: Knowledge of General Education information will improve Description: Including pain rating scale, medication(s)/side effects and non-pharmacologic comfort measures Outcome: Progressing   Problem: Health Behavior/Discharge Planning: Goal: Ability to manage health-related needs will improve Outcome: Progressing   Problem: Clinical Measurements: Goal: Ability to maintain clinical measurements within normal limits will improve Outcome: Progressing Goal: Will remain free from infection Outcome: Progressing Goal: Diagnostic test results will improve Outcome: Progressing Goal: Cardiovascular complication will be avoided Outcome: Progressing   Problem: Activity: Goal: Risk for activity intolerance will decrease Outcome: Progressing   Problem: Nutrition: Goal: Adequate nutrition will be maintained Outcome: Progressing   Problem: Coping: Goal: Level of anxiety will decrease Outcome: Progressing   Problem: Elimination: Goal: Will not experience complications related to bowel motility Outcome: Progressing Goal: Will not experience complications related to urinary retention Outcome: Progressing   Problem: Pain Managment: Goal: General experience of comfort will improve Outcome:  Progressing   Problem: Safety: Goal: Ability to remain free from injury will improve Outcome: Progressing   Problem: Skin Integrity: Goal: Risk for impaired skin integrity will decrease Outcome: Progressing

## 2023-07-17 NOTE — Progress Notes (Signed)
Physical Therapy Treatment Patient Details Name: Frances Ortiz MRN: [id number] DOB: October 04, 1931 Today's Date: 07/17/2023   History of Present Illness 89 yo female presents to [facility]20 from SNF for n/v, hematemesis. S/p EGD 9/22, showing non-bleeding gastric ulcers, multiple gastric polyps s/p biopsies. Recent admission for fall and R hip fx, s/p R hip hemiarthroplasty posterior approach on 8/31.  PMH significant of hyperlipidemia, anxiety/depression, cecal cancer s/p R colectomy 2018, GERD, osteoporosis.    PT Comments  Patient is agreeable to PT evaluation. She continues to require assistance with bed mobility and transfers. Increased activity tolerance this session. Patient ambulated 95ft with Min A with rolling walker with cues for improved gait kinematics. Activity tolerance is limited by fatigue. Continue to recommend rehabilitation after this hospital stay <3 hours/day to maximize independence and decrease caregiver burden.     If plan is discharge home, recommend the following: A lot of help with bathing/dressing/bathroom;Assistance with cooking/housework;Assist for transportation;Help with stairs or ramp for entrance;A lot of help with walking and/or transfers   Can travel by private vehicle     No  Equipment Recommendations  None recommended by PT    Recommendations for Other Services       Precautions / Restrictions Precautions Precautions: Fall Precaution Comments: posterior hip Restrictions Weight Bearing Restrictions: Yes RLE Weight Bearing: Weight bearing as tolerated     Mobility  Bed Mobility Overal bed mobility: Needs Assistance Bed Mobility: Supine to Sit, Sit to Supine     Supine to sit: Mod assist, HOB elevated Sit to supine: Mod assist, HOB elevated   General bed mobility comments: verbal cues for sequencing and technique '    Transfers Overall transfer level: Needs assistance Equipment used: Rolling walker (2 wheels)   Sit to Stand: Min  assist           General transfer comment: lifting assistance required for standing. cues for hand placement for safety. good eccentric control demonstrated    Ambulation/Gait Ambulation/Gait assistance: Min assist Gait Distance (Feet): 15 Feet Assistive device: Rolling walker (2 wheels) Gait Pattern/deviations: Step-to pattern, Decreased stance time - right, Decreased stride length, Narrow base of support Gait velocity: decreased     General Gait Details: cues for rolling walker and BLE sequencing with increased independence with correct technique. steadying assistance provided. multiple standing rest breaks and increased time required to complete short distance ambulation. activity tolerance limited by fatigue   Stairs             Wheelchair Mobility     Tilt Bed    Modified Rankin (Stroke Patients Only)       Balance                                            Cognition Arousal: Alert Behavior During Therapy: WFL for tasks assessed/performed, Flat affect Overall Cognitive Status: Within Functional Limits for tasks assessed Area of Impairment: Memory                     Memory: Decreased recall of precautions         General Comments: decreased recall of posterior hip precuations        Exercises General Exercises - Lower Extremity Ankle Circles/Pumps: AROM, Strengthening, Right, 10 reps, Supine Heel Slides: AAROM, Strengthening, Both, 10 reps, Supine Hip ABduction/ADduction: AAROM, Strengthening, Both, 10 reps, Supine Other Exercises  Other Exercises: cues for exercise technique for strengthening    General Comments        Pertinent Vitals/Pain Pain Assessment Pain Assessment: Faces Faces Pain Scale: Hurts a little bit Pain Location: right hip Pain Descriptors / Indicators: Sore Pain Intervention(s): Limited activity within patient's tolerance, Monitored during session    Home Living                           Prior Function            PT Goals (current goals can now be found in the care plan section) Acute Rehab PT Goals Patient Stated Goal: get to rehab and return thome PT Goal Formulation: With patient Time For Goal Achievement: 07/29/23 Potential to Achieve Goals: Good Progress towards PT goals: Progressing toward goals    Frequency    Min 1X/week      PT Plan      Co-evaluation              AM-PAC PT "6 Clicks" Mobility   Outcome Measure  Help needed turning from your back to your side while in a flat bed without using bedrails?: A Lot Help needed moving from lying on your back to sitting on the side of a flat bed without using bedrails?: A Lot Help needed moving to and from a bed to a chair (including a wheelchair)?: A Lot Help needed standing up from a chair using your arms (e.g., wheelchair or bedside chair)?: A Lot Help needed to walk in hospital room?: Total Help needed climbing 3-5 steps with a railing? : Total 6 Click Score: 10    End of Session   Activity Tolerance: Patient limited by fatigue Patient left: in bed;with call bell/phone within reach;Other (comment);with bed alarm set (personal caregiver present throughout session) Nurse Communication: Mobility status PT Visit Diagnosis: Other abnormalities of gait and mobility (R26.89);History of falling (Z91.81);Muscle weakness (generalized) (M62.81);Pain     Time: 1610-9604 PT Time Calculation (min) (ACUTE ONLY): 26 min  Charges:    $Gait Training: 8-22 mins $Therapeutic Exercise: 8-22 mins PT General Charges $$ ACUTE PT VISIT: 1 Visit                     Frances Ortiz, PT, MPT    Frances Ortiz 07/17/2023, 1:30 PM

## 2023-07-17 NOTE — Progress Notes (Signed)
1255Pt was picked up by [facility] to transport back to  [facility]. Pt was d/c'd with her belongings, daughter at her bedside.    At 1300 facility called at [phone], someone answered and was sending call to the hall pt will be going back to (room 603b)   At 1305 report given to nurse [facility]

## 2023-07-17 NOTE — Consult Note (Signed)
[facility] Customer service manager [facility]) Accountable Care Organization (ACO) [facility] Liaison Note  07/17/2023  [name] 1931-08-23 [id number]  Location: [facility] Liaison met patient at bedside at [facility].  Insurance: [employer] [facility] Advantage   Frances Ortiz is a 89 y.o. female who is a Primary Care Patient of Eloisa Northern, MD. The patient was screened for 30 day readmission hospitalization with noted medium risk score for unplanned readmission risk with 3 IP in 6 months.  The patient was assessed for potential [facility]) Care Management service needs for post hospital transition for care coordination. Review of patient's electronic medical record reveals patient was admitted for Upper GI Bleed. Liaison visited pt at bedside in the present of her personal caregiver. Introduce community care management services and verified her plan of care for SNF level of care. States she will return to [facility] for ongoing short term rehabilitation therapy today. Facility will continue to address pt's ongoing needs. Pt has a good support system upon her discharged from the facility with no anticipated needs for VBCI to address at this time.   Plan: Discharge to SNF level of care. Facility will continue to provide pt's needs.   [facility] Care Management/Population Health does not replace or interfere with any arrangements made by the Inpatient Transition of Care team.   For questions contact:   Elliot Cousin, RN, [facility] Liaison [place]   Population Health Office Hours MTWF  8:00 am-6:00 pm [phone] mobile [phone] [Office toll free line] Office Hours are M-F 8:30 - 5 pm [email or web address]

## 2023-07-18 DIAGNOSIS — K279 Peptic ulcer, site unspecified, unspecified as acute or chronic, without hemorrhage or perforation: Secondary | ICD-10-CM | POA: Diagnosis not present

## 2023-07-18 DIAGNOSIS — R911 Solitary pulmonary nodule: Secondary | ICD-10-CM | POA: Diagnosis not present

## 2023-07-18 DIAGNOSIS — I10 Essential (primary) hypertension: Secondary | ICD-10-CM | POA: Diagnosis not present

## 2023-07-18 DIAGNOSIS — D500 Iron deficiency anemia secondary to blood loss (chronic): Secondary | ICD-10-CM | POA: Diagnosis not present

## 2023-07-24 DIAGNOSIS — M48061 Spinal stenosis, lumbar region without neurogenic claudication: Secondary | ICD-10-CM | POA: Diagnosis not present

## 2023-07-24 DIAGNOSIS — I10 Essential (primary) hypertension: Secondary | ICD-10-CM | POA: Diagnosis not present

## 2023-07-24 DIAGNOSIS — M6281 Muscle weakness (generalized): Secondary | ICD-10-CM | POA: Diagnosis not present

## 2023-07-24 DIAGNOSIS — S71001A Unspecified open wound, right hip, initial encounter: Secondary | ICD-10-CM | POA: Diagnosis not present

## 2023-07-25 DIAGNOSIS — Z7689 Persons encountering health services in other specified circumstances: Secondary | ICD-10-CM

## 2023-07-25 DIAGNOSIS — D500 Iron deficiency anemia secondary to blood loss (chronic): Secondary | ICD-10-CM | POA: Diagnosis not present

## 2023-07-25 DIAGNOSIS — R000 Tachycardia, unspecified: Secondary | ICD-10-CM | POA: Diagnosis not present

## 2023-07-25 DIAGNOSIS — R911 Solitary pulmonary nodule: Secondary | ICD-10-CM | POA: Diagnosis not present

## 2023-07-25 DIAGNOSIS — R110 Nausea: Secondary | ICD-10-CM

## 2023-07-25 DIAGNOSIS — I10 Essential (primary) hypertension: Secondary | ICD-10-CM | POA: Diagnosis not present

## 2023-07-25 DIAGNOSIS — K279 Peptic ulcer, site unspecified, unspecified as acute or chronic, without hemorrhage or perforation: Secondary | ICD-10-CM

## 2023-07-25 DIAGNOSIS — M6281 Muscle weakness (generalized): Secondary | ICD-10-CM

## 2023-07-25 DIAGNOSIS — R2689 Other abnormalities of gait and mobility: Secondary | ICD-10-CM

## 2023-07-25 DIAGNOSIS — K8020 Calculus of gallbladder without cholecystitis without obstruction: Secondary | ICD-10-CM

## 2023-07-26 DIAGNOSIS — F419 Anxiety disorder, unspecified: Secondary | ICD-10-CM | POA: Diagnosis not present

## 2023-07-26 DIAGNOSIS — R911 Solitary pulmonary nodule: Secondary | ICD-10-CM | POA: Diagnosis not present

## 2023-07-26 DIAGNOSIS — M8580 Other specified disorders of bone density and structure, unspecified site: Secondary | ICD-10-CM | POA: Diagnosis not present

## 2023-07-26 DIAGNOSIS — D62 Acute posthemorrhagic anemia: Secondary | ICD-10-CM | POA: Diagnosis not present

## 2023-07-26 DIAGNOSIS — K219 Gastro-esophageal reflux disease without esophagitis: Secondary | ICD-10-CM | POA: Diagnosis not present

## 2023-07-26 DIAGNOSIS — S72041D Displaced fracture of base of neck of right femur, subsequent encounter for closed fracture with routine healing: Secondary | ICD-10-CM | POA: Diagnosis not present

## 2023-07-26 DIAGNOSIS — I2510 Atherosclerotic heart disease of native coronary artery without angina pectoris: Secondary | ICD-10-CM | POA: Diagnosis not present

## 2023-07-26 DIAGNOSIS — M47812 Spondylosis without myelopathy or radiculopathy, cervical region: Secondary | ICD-10-CM | POA: Diagnosis not present

## 2023-07-26 DIAGNOSIS — E785 Hyperlipidemia, unspecified: Secondary | ICD-10-CM | POA: Diagnosis not present

## 2023-07-26 DIAGNOSIS — I10 Essential (primary) hypertension: Secondary | ICD-10-CM | POA: Diagnosis not present

## 2023-07-26 DIAGNOSIS — F32A Depression, unspecified: Secondary | ICD-10-CM | POA: Diagnosis not present

## 2023-07-26 DIAGNOSIS — S72011D Unspecified intracapsular fracture of right femur, subsequent encounter for closed fracture with routine healing: Secondary | ICD-10-CM | POA: Diagnosis not present

## 2023-07-26 DIAGNOSIS — I700 Atherosclerosis of aorta: Secondary | ICD-10-CM | POA: Diagnosis not present

## 2023-07-26 DIAGNOSIS — Z85038 Personal history of other malignant neoplasm of large intestine: Secondary | ICD-10-CM | POA: Diagnosis not present

## 2023-07-26 DIAGNOSIS — K922 Gastrointestinal hemorrhage, unspecified: Secondary | ICD-10-CM | POA: Diagnosis not present

## 2023-07-29 DIAGNOSIS — M47812 Spondylosis without myelopathy or radiculopathy, cervical region: Secondary | ICD-10-CM | POA: Diagnosis not present

## 2023-07-29 DIAGNOSIS — I700 Atherosclerosis of aorta: Secondary | ICD-10-CM | POA: Diagnosis not present

## 2023-07-29 DIAGNOSIS — M8580 Other specified disorders of bone density and structure, unspecified site: Secondary | ICD-10-CM | POA: Diagnosis not present

## 2023-07-29 DIAGNOSIS — F419 Anxiety disorder, unspecified: Secondary | ICD-10-CM | POA: Diagnosis not present

## 2023-07-29 DIAGNOSIS — R911 Solitary pulmonary nodule: Secondary | ICD-10-CM | POA: Diagnosis not present

## 2023-07-29 DIAGNOSIS — F32A Depression, unspecified: Secondary | ICD-10-CM | POA: Diagnosis not present

## 2023-07-29 DIAGNOSIS — Z85038 Personal history of other malignant neoplasm of large intestine: Secondary | ICD-10-CM | POA: Diagnosis not present

## 2023-07-29 DIAGNOSIS — I10 Essential (primary) hypertension: Secondary | ICD-10-CM | POA: Diagnosis not present

## 2023-07-29 DIAGNOSIS — K219 Gastro-esophageal reflux disease without esophagitis: Secondary | ICD-10-CM | POA: Diagnosis not present

## 2023-07-29 DIAGNOSIS — D62 Acute posthemorrhagic anemia: Secondary | ICD-10-CM | POA: Diagnosis not present

## 2023-07-29 DIAGNOSIS — K922 Gastrointestinal hemorrhage, unspecified: Secondary | ICD-10-CM | POA: Diagnosis not present

## 2023-07-29 DIAGNOSIS — S72011D Unspecified intracapsular fracture of right femur, subsequent encounter for closed fracture with routine healing: Secondary | ICD-10-CM | POA: Diagnosis not present

## 2023-07-29 DIAGNOSIS — E785 Hyperlipidemia, unspecified: Secondary | ICD-10-CM | POA: Diagnosis not present

## 2023-07-29 DIAGNOSIS — I2510 Atherosclerotic heart disease of native coronary artery without angina pectoris: Secondary | ICD-10-CM | POA: Diagnosis not present

## 2023-07-31 DIAGNOSIS — F32A Depression, unspecified: Secondary | ICD-10-CM | POA: Diagnosis not present

## 2023-07-31 DIAGNOSIS — S72011D Unspecified intracapsular fracture of right femur, subsequent encounter for closed fracture with routine healing: Secondary | ICD-10-CM | POA: Diagnosis not present

## 2023-07-31 DIAGNOSIS — I2510 Atherosclerotic heart disease of native coronary artery without angina pectoris: Secondary | ICD-10-CM | POA: Diagnosis not present

## 2023-07-31 DIAGNOSIS — F419 Anxiety disorder, unspecified: Secondary | ICD-10-CM | POA: Diagnosis not present

## 2023-07-31 DIAGNOSIS — I10 Essential (primary) hypertension: Secondary | ICD-10-CM | POA: Diagnosis not present

## 2023-07-31 DIAGNOSIS — I700 Atherosclerosis of aorta: Secondary | ICD-10-CM | POA: Diagnosis not present

## 2023-07-31 DIAGNOSIS — R911 Solitary pulmonary nodule: Secondary | ICD-10-CM | POA: Diagnosis not present

## 2023-07-31 DIAGNOSIS — K219 Gastro-esophageal reflux disease without esophagitis: Secondary | ICD-10-CM | POA: Diagnosis not present

## 2023-07-31 DIAGNOSIS — E785 Hyperlipidemia, unspecified: Secondary | ICD-10-CM | POA: Diagnosis not present

## 2023-07-31 DIAGNOSIS — M8580 Other specified disorders of bone density and structure, unspecified site: Secondary | ICD-10-CM | POA: Diagnosis not present

## 2023-07-31 DIAGNOSIS — K922 Gastrointestinal hemorrhage, unspecified: Secondary | ICD-10-CM | POA: Diagnosis not present

## 2023-07-31 DIAGNOSIS — M47812 Spondylosis without myelopathy or radiculopathy, cervical region: Secondary | ICD-10-CM | POA: Diagnosis not present

## 2023-07-31 DIAGNOSIS — D62 Acute posthemorrhagic anemia: Secondary | ICD-10-CM | POA: Diagnosis not present

## 2023-07-31 DIAGNOSIS — Z85038 Personal history of other malignant neoplasm of large intestine: Secondary | ICD-10-CM | POA: Diagnosis not present

## 2023-08-01 DIAGNOSIS — F419 Anxiety disorder, unspecified: Secondary | ICD-10-CM | POA: Diagnosis not present

## 2023-08-01 DIAGNOSIS — R911 Solitary pulmonary nodule: Secondary | ICD-10-CM | POA: Diagnosis not present

## 2023-08-01 DIAGNOSIS — I10 Essential (primary) hypertension: Secondary | ICD-10-CM | POA: Diagnosis not present

## 2023-08-01 DIAGNOSIS — Z85038 Personal history of other malignant neoplasm of large intestine: Secondary | ICD-10-CM | POA: Diagnosis not present

## 2023-08-01 DIAGNOSIS — D62 Acute posthemorrhagic anemia: Secondary | ICD-10-CM | POA: Diagnosis not present

## 2023-08-01 DIAGNOSIS — I2510 Atherosclerotic heart disease of native coronary artery without angina pectoris: Secondary | ICD-10-CM | POA: Diagnosis not present

## 2023-08-01 DIAGNOSIS — K219 Gastro-esophageal reflux disease without esophagitis: Secondary | ICD-10-CM | POA: Diagnosis not present

## 2023-08-01 DIAGNOSIS — S72011D Unspecified intracapsular fracture of right femur, subsequent encounter for closed fracture with routine healing: Secondary | ICD-10-CM | POA: Diagnosis not present

## 2023-08-01 DIAGNOSIS — F32A Depression, unspecified: Secondary | ICD-10-CM | POA: Diagnosis not present

## 2023-08-01 DIAGNOSIS — E785 Hyperlipidemia, unspecified: Secondary | ICD-10-CM | POA: Diagnosis not present

## 2023-08-01 DIAGNOSIS — K922 Gastrointestinal hemorrhage, unspecified: Secondary | ICD-10-CM | POA: Diagnosis not present

## 2023-08-01 DIAGNOSIS — M47812 Spondylosis without myelopathy or radiculopathy, cervical region: Secondary | ICD-10-CM | POA: Diagnosis not present

## 2023-08-01 DIAGNOSIS — I700 Atherosclerosis of aorta: Secondary | ICD-10-CM | POA: Diagnosis not present

## 2023-08-01 DIAGNOSIS — M8580 Other specified disorders of bone density and structure, unspecified site: Secondary | ICD-10-CM | POA: Diagnosis not present

## 2023-08-05 DIAGNOSIS — S72011D Unspecified intracapsular fracture of right femur, subsequent encounter for closed fracture with routine healing: Secondary | ICD-10-CM | POA: Diagnosis not present

## 2023-08-05 DIAGNOSIS — F419 Anxiety disorder, unspecified: Secondary | ICD-10-CM | POA: Diagnosis not present

## 2023-08-05 DIAGNOSIS — Z85038 Personal history of other malignant neoplasm of large intestine: Secondary | ICD-10-CM | POA: Diagnosis not present

## 2023-08-05 DIAGNOSIS — K922 Gastrointestinal hemorrhage, unspecified: Secondary | ICD-10-CM | POA: Diagnosis not present

## 2023-08-05 DIAGNOSIS — I700 Atherosclerosis of aorta: Secondary | ICD-10-CM | POA: Diagnosis not present

## 2023-08-05 DIAGNOSIS — F32A Depression, unspecified: Secondary | ICD-10-CM | POA: Diagnosis not present

## 2023-08-05 DIAGNOSIS — R911 Solitary pulmonary nodule: Secondary | ICD-10-CM | POA: Diagnosis not present

## 2023-08-05 DIAGNOSIS — I10 Essential (primary) hypertension: Secondary | ICD-10-CM | POA: Diagnosis not present

## 2023-08-05 DIAGNOSIS — M47812 Spondylosis without myelopathy or radiculopathy, cervical region: Secondary | ICD-10-CM | POA: Diagnosis not present

## 2023-08-05 DIAGNOSIS — K219 Gastro-esophageal reflux disease without esophagitis: Secondary | ICD-10-CM | POA: Diagnosis not present

## 2023-08-05 DIAGNOSIS — E785 Hyperlipidemia, unspecified: Secondary | ICD-10-CM | POA: Diagnosis not present

## 2023-08-05 DIAGNOSIS — I2510 Atherosclerotic heart disease of native coronary artery without angina pectoris: Secondary | ICD-10-CM | POA: Diagnosis not present

## 2023-08-05 DIAGNOSIS — M8580 Other specified disorders of bone density and structure, unspecified site: Secondary | ICD-10-CM | POA: Diagnosis not present

## 2023-08-05 DIAGNOSIS — D62 Acute posthemorrhagic anemia: Secondary | ICD-10-CM | POA: Diagnosis not present

## 2023-08-06 DIAGNOSIS — Z8719 Personal history of other diseases of the digestive system: Secondary | ICD-10-CM | POA: Diagnosis not present

## 2023-08-06 DIAGNOSIS — K922 Gastrointestinal hemorrhage, unspecified: Secondary | ICD-10-CM | POA: Diagnosis not present

## 2023-08-07 DIAGNOSIS — K922 Gastrointestinal hemorrhage, unspecified: Secondary | ICD-10-CM | POA: Diagnosis not present

## 2023-08-07 DIAGNOSIS — R911 Solitary pulmonary nodule: Secondary | ICD-10-CM | POA: Diagnosis not present

## 2023-08-07 DIAGNOSIS — Z85038 Personal history of other malignant neoplasm of large intestine: Secondary | ICD-10-CM | POA: Diagnosis not present

## 2023-08-07 DIAGNOSIS — I2510 Atherosclerotic heart disease of native coronary artery without angina pectoris: Secondary | ICD-10-CM | POA: Diagnosis not present

## 2023-08-07 DIAGNOSIS — F419 Anxiety disorder, unspecified: Secondary | ICD-10-CM | POA: Diagnosis not present

## 2023-08-07 DIAGNOSIS — D62 Acute posthemorrhagic anemia: Secondary | ICD-10-CM | POA: Diagnosis not present

## 2023-08-07 DIAGNOSIS — M47812 Spondylosis without myelopathy or radiculopathy, cervical region: Secondary | ICD-10-CM | POA: Diagnosis not present

## 2023-08-07 DIAGNOSIS — E785 Hyperlipidemia, unspecified: Secondary | ICD-10-CM | POA: Diagnosis not present

## 2023-08-07 DIAGNOSIS — F32A Depression, unspecified: Secondary | ICD-10-CM | POA: Diagnosis not present

## 2023-08-07 DIAGNOSIS — I700 Atherosclerosis of aorta: Secondary | ICD-10-CM | POA: Diagnosis not present

## 2023-08-07 DIAGNOSIS — M8580 Other specified disorders of bone density and structure, unspecified site: Secondary | ICD-10-CM | POA: Diagnosis not present

## 2023-08-07 DIAGNOSIS — K219 Gastro-esophageal reflux disease without esophagitis: Secondary | ICD-10-CM | POA: Diagnosis not present

## 2023-08-07 DIAGNOSIS — S72011D Unspecified intracapsular fracture of right femur, subsequent encounter for closed fracture with routine healing: Secondary | ICD-10-CM | POA: Diagnosis not present

## 2023-08-07 DIAGNOSIS — I10 Essential (primary) hypertension: Secondary | ICD-10-CM | POA: Diagnosis not present

## 2023-08-08 DIAGNOSIS — I10 Essential (primary) hypertension: Secondary | ICD-10-CM | POA: Diagnosis not present

## 2023-08-08 DIAGNOSIS — K219 Gastro-esophageal reflux disease without esophagitis: Secondary | ICD-10-CM | POA: Diagnosis not present

## 2023-08-08 DIAGNOSIS — K922 Gastrointestinal hemorrhage, unspecified: Secondary | ICD-10-CM | POA: Diagnosis not present

## 2023-08-08 DIAGNOSIS — I700 Atherosclerosis of aorta: Secondary | ICD-10-CM | POA: Diagnosis not present

## 2023-08-08 DIAGNOSIS — D62 Acute posthemorrhagic anemia: Secondary | ICD-10-CM | POA: Diagnosis not present

## 2023-08-08 DIAGNOSIS — Z85038 Personal history of other malignant neoplasm of large intestine: Secondary | ICD-10-CM | POA: Diagnosis not present

## 2023-08-08 DIAGNOSIS — M8580 Other specified disorders of bone density and structure, unspecified site: Secondary | ICD-10-CM | POA: Diagnosis not present

## 2023-08-08 DIAGNOSIS — F32A Depression, unspecified: Secondary | ICD-10-CM | POA: Diagnosis not present

## 2023-08-08 DIAGNOSIS — M47812 Spondylosis without myelopathy or radiculopathy, cervical region: Secondary | ICD-10-CM | POA: Diagnosis not present

## 2023-08-08 DIAGNOSIS — S72011D Unspecified intracapsular fracture of right femur, subsequent encounter for closed fracture with routine healing: Secondary | ICD-10-CM | POA: Diagnosis not present

## 2023-08-08 DIAGNOSIS — I2510 Atherosclerotic heart disease of native coronary artery without angina pectoris: Secondary | ICD-10-CM | POA: Diagnosis not present

## 2023-08-08 DIAGNOSIS — E785 Hyperlipidemia, unspecified: Secondary | ICD-10-CM | POA: Diagnosis not present

## 2023-08-08 DIAGNOSIS — R911 Solitary pulmonary nodule: Secondary | ICD-10-CM | POA: Diagnosis not present

## 2023-08-08 DIAGNOSIS — F419 Anxiety disorder, unspecified: Secondary | ICD-10-CM | POA: Diagnosis not present

## 2023-08-09 DIAGNOSIS — M8580 Other specified disorders of bone density and structure, unspecified site: Secondary | ICD-10-CM | POA: Diagnosis not present

## 2023-08-09 DIAGNOSIS — K219 Gastro-esophageal reflux disease without esophagitis: Secondary | ICD-10-CM | POA: Diagnosis not present

## 2023-08-09 DIAGNOSIS — I10 Essential (primary) hypertension: Secondary | ICD-10-CM | POA: Diagnosis not present

## 2023-08-09 DIAGNOSIS — E785 Hyperlipidemia, unspecified: Secondary | ICD-10-CM | POA: Diagnosis not present

## 2023-08-09 DIAGNOSIS — I2510 Atherosclerotic heart disease of native coronary artery without angina pectoris: Secondary | ICD-10-CM | POA: Diagnosis not present

## 2023-08-09 DIAGNOSIS — F32A Depression, unspecified: Secondary | ICD-10-CM | POA: Diagnosis not present

## 2023-08-09 DIAGNOSIS — F419 Anxiety disorder, unspecified: Secondary | ICD-10-CM | POA: Diagnosis not present

## 2023-08-09 DIAGNOSIS — Z85038 Personal history of other malignant neoplasm of large intestine: Secondary | ICD-10-CM | POA: Diagnosis not present

## 2023-08-09 DIAGNOSIS — R911 Solitary pulmonary nodule: Secondary | ICD-10-CM | POA: Diagnosis not present

## 2023-08-09 DIAGNOSIS — D62 Acute posthemorrhagic anemia: Secondary | ICD-10-CM | POA: Diagnosis not present

## 2023-08-09 DIAGNOSIS — M47812 Spondylosis without myelopathy or radiculopathy, cervical region: Secondary | ICD-10-CM | POA: Diagnosis not present

## 2023-08-09 DIAGNOSIS — I700 Atherosclerosis of aorta: Secondary | ICD-10-CM | POA: Diagnosis not present

## 2023-08-09 DIAGNOSIS — S72011D Unspecified intracapsular fracture of right femur, subsequent encounter for closed fracture with routine healing: Secondary | ICD-10-CM | POA: Diagnosis not present

## 2023-08-09 DIAGNOSIS — K922 Gastrointestinal hemorrhage, unspecified: Secondary | ICD-10-CM | POA: Diagnosis not present

## 2023-08-12 DIAGNOSIS — I2510 Atherosclerotic heart disease of native coronary artery without angina pectoris: Secondary | ICD-10-CM | POA: Diagnosis not present

## 2023-08-12 DIAGNOSIS — K922 Gastrointestinal hemorrhage, unspecified: Secondary | ICD-10-CM | POA: Diagnosis not present

## 2023-08-12 DIAGNOSIS — D62 Acute posthemorrhagic anemia: Secondary | ICD-10-CM | POA: Diagnosis not present

## 2023-08-12 DIAGNOSIS — S72011D Unspecified intracapsular fracture of right femur, subsequent encounter for closed fracture with routine healing: Secondary | ICD-10-CM | POA: Diagnosis not present

## 2023-08-12 DIAGNOSIS — I700 Atherosclerosis of aorta: Secondary | ICD-10-CM | POA: Diagnosis not present

## 2023-08-12 DIAGNOSIS — R911 Solitary pulmonary nodule: Secondary | ICD-10-CM | POA: Diagnosis not present

## 2023-08-12 DIAGNOSIS — K219 Gastro-esophageal reflux disease without esophagitis: Secondary | ICD-10-CM | POA: Diagnosis not present

## 2023-08-12 DIAGNOSIS — Z85038 Personal history of other malignant neoplasm of large intestine: Secondary | ICD-10-CM | POA: Diagnosis not present

## 2023-08-12 DIAGNOSIS — M8580 Other specified disorders of bone density and structure, unspecified site: Secondary | ICD-10-CM | POA: Diagnosis not present

## 2023-08-12 DIAGNOSIS — E785 Hyperlipidemia, unspecified: Secondary | ICD-10-CM | POA: Diagnosis not present

## 2023-08-12 DIAGNOSIS — I10 Essential (primary) hypertension: Secondary | ICD-10-CM | POA: Diagnosis not present

## 2023-08-12 DIAGNOSIS — M47812 Spondylosis without myelopathy or radiculopathy, cervical region: Secondary | ICD-10-CM | POA: Diagnosis not present

## 2023-08-12 DIAGNOSIS — F32A Depression, unspecified: Secondary | ICD-10-CM | POA: Diagnosis not present

## 2023-08-12 DIAGNOSIS — F419 Anxiety disorder, unspecified: Secondary | ICD-10-CM | POA: Diagnosis not present

## 2023-08-14 DIAGNOSIS — M47812 Spondylosis without myelopathy or radiculopathy, cervical region: Secondary | ICD-10-CM | POA: Diagnosis not present

## 2023-08-14 DIAGNOSIS — D62 Acute posthemorrhagic anemia: Secondary | ICD-10-CM | POA: Diagnosis not present

## 2023-08-14 DIAGNOSIS — F32A Depression, unspecified: Secondary | ICD-10-CM | POA: Diagnosis not present

## 2023-08-14 DIAGNOSIS — K922 Gastrointestinal hemorrhage, unspecified: Secondary | ICD-10-CM | POA: Diagnosis not present

## 2023-08-14 DIAGNOSIS — Z85038 Personal history of other malignant neoplasm of large intestine: Secondary | ICD-10-CM | POA: Diagnosis not present

## 2023-08-14 DIAGNOSIS — I10 Essential (primary) hypertension: Secondary | ICD-10-CM | POA: Diagnosis not present

## 2023-08-14 DIAGNOSIS — F419 Anxiety disorder, unspecified: Secondary | ICD-10-CM | POA: Diagnosis not present

## 2023-08-14 DIAGNOSIS — I2510 Atherosclerotic heart disease of native coronary artery without angina pectoris: Secondary | ICD-10-CM | POA: Diagnosis not present

## 2023-08-14 DIAGNOSIS — I700 Atherosclerosis of aorta: Secondary | ICD-10-CM | POA: Diagnosis not present

## 2023-08-14 DIAGNOSIS — M8580 Other specified disorders of bone density and structure, unspecified site: Secondary | ICD-10-CM | POA: Diagnosis not present

## 2023-08-14 DIAGNOSIS — K219 Gastro-esophageal reflux disease without esophagitis: Secondary | ICD-10-CM | POA: Diagnosis not present

## 2023-08-14 DIAGNOSIS — E785 Hyperlipidemia, unspecified: Secondary | ICD-10-CM | POA: Diagnosis not present

## 2023-08-14 DIAGNOSIS — R911 Solitary pulmonary nodule: Secondary | ICD-10-CM | POA: Diagnosis not present

## 2023-08-14 DIAGNOSIS — S72011D Unspecified intracapsular fracture of right femur, subsequent encounter for closed fracture with routine healing: Secondary | ICD-10-CM | POA: Diagnosis not present

## 2023-08-15 DIAGNOSIS — Z85038 Personal history of other malignant neoplasm of large intestine: Secondary | ICD-10-CM | POA: Diagnosis not present

## 2023-08-15 DIAGNOSIS — D62 Acute posthemorrhagic anemia: Secondary | ICD-10-CM | POA: Diagnosis not present

## 2023-08-15 DIAGNOSIS — F32A Depression, unspecified: Secondary | ICD-10-CM | POA: Diagnosis not present

## 2023-08-15 DIAGNOSIS — M8580 Other specified disorders of bone density and structure, unspecified site: Secondary | ICD-10-CM | POA: Diagnosis not present

## 2023-08-15 DIAGNOSIS — R911 Solitary pulmonary nodule: Secondary | ICD-10-CM | POA: Diagnosis not present

## 2023-08-15 DIAGNOSIS — K922 Gastrointestinal hemorrhage, unspecified: Secondary | ICD-10-CM | POA: Diagnosis not present

## 2023-08-15 DIAGNOSIS — I700 Atherosclerosis of aorta: Secondary | ICD-10-CM | POA: Diagnosis not present

## 2023-08-15 DIAGNOSIS — S72011D Unspecified intracapsular fracture of right femur, subsequent encounter for closed fracture with routine healing: Secondary | ICD-10-CM | POA: Diagnosis not present

## 2023-08-15 DIAGNOSIS — I10 Essential (primary) hypertension: Secondary | ICD-10-CM | POA: Diagnosis not present

## 2023-08-15 DIAGNOSIS — M47812 Spondylosis without myelopathy or radiculopathy, cervical region: Secondary | ICD-10-CM | POA: Diagnosis not present

## 2023-08-15 DIAGNOSIS — E785 Hyperlipidemia, unspecified: Secondary | ICD-10-CM | POA: Diagnosis not present

## 2023-08-15 DIAGNOSIS — I2510 Atherosclerotic heart disease of native coronary artery without angina pectoris: Secondary | ICD-10-CM | POA: Diagnosis not present

## 2023-08-15 DIAGNOSIS — F419 Anxiety disorder, unspecified: Secondary | ICD-10-CM | POA: Diagnosis not present

## 2023-08-15 DIAGNOSIS — K219 Gastro-esophageal reflux disease without esophagitis: Secondary | ICD-10-CM | POA: Diagnosis not present

## 2023-08-16 DIAGNOSIS — K922 Gastrointestinal hemorrhage, unspecified: Secondary | ICD-10-CM | POA: Diagnosis not present

## 2023-08-16 DIAGNOSIS — E785 Hyperlipidemia, unspecified: Secondary | ICD-10-CM | POA: Diagnosis not present

## 2023-08-16 DIAGNOSIS — I2510 Atherosclerotic heart disease of native coronary artery without angina pectoris: Secondary | ICD-10-CM | POA: Diagnosis not present

## 2023-08-16 DIAGNOSIS — M47812 Spondylosis without myelopathy or radiculopathy, cervical region: Secondary | ICD-10-CM | POA: Diagnosis not present

## 2023-08-16 DIAGNOSIS — R911 Solitary pulmonary nodule: Secondary | ICD-10-CM | POA: Diagnosis not present

## 2023-08-16 DIAGNOSIS — F419 Anxiety disorder, unspecified: Secondary | ICD-10-CM | POA: Diagnosis not present

## 2023-08-16 DIAGNOSIS — Z85038 Personal history of other malignant neoplasm of large intestine: Secondary | ICD-10-CM | POA: Diagnosis not present

## 2023-08-16 DIAGNOSIS — F32A Depression, unspecified: Secondary | ICD-10-CM | POA: Diagnosis not present

## 2023-08-16 DIAGNOSIS — I10 Essential (primary) hypertension: Secondary | ICD-10-CM | POA: Diagnosis not present

## 2023-08-16 DIAGNOSIS — I700 Atherosclerosis of aorta: Secondary | ICD-10-CM | POA: Diagnosis not present

## 2023-08-16 DIAGNOSIS — D62 Acute posthemorrhagic anemia: Secondary | ICD-10-CM | POA: Diagnosis not present

## 2023-08-16 DIAGNOSIS — M8580 Other specified disorders of bone density and structure, unspecified site: Secondary | ICD-10-CM | POA: Diagnosis not present

## 2023-08-16 DIAGNOSIS — S72011D Unspecified intracapsular fracture of right femur, subsequent encounter for closed fracture with routine healing: Secondary | ICD-10-CM | POA: Diagnosis not present

## 2023-08-16 DIAGNOSIS — K219 Gastro-esophageal reflux disease without esophagitis: Secondary | ICD-10-CM | POA: Diagnosis not present

## 2023-08-19 DIAGNOSIS — Z85038 Personal history of other malignant neoplasm of large intestine: Secondary | ICD-10-CM | POA: Diagnosis not present

## 2023-08-19 DIAGNOSIS — I700 Atherosclerosis of aorta: Secondary | ICD-10-CM | POA: Diagnosis not present

## 2023-08-19 DIAGNOSIS — I2510 Atherosclerotic heart disease of native coronary artery without angina pectoris: Secondary | ICD-10-CM | POA: Diagnosis not present

## 2023-08-19 DIAGNOSIS — F32A Depression, unspecified: Secondary | ICD-10-CM | POA: Diagnosis not present

## 2023-08-19 DIAGNOSIS — S72011D Unspecified intracapsular fracture of right femur, subsequent encounter for closed fracture with routine healing: Secondary | ICD-10-CM | POA: Diagnosis not present

## 2023-08-19 DIAGNOSIS — E785 Hyperlipidemia, unspecified: Secondary | ICD-10-CM | POA: Diagnosis not present

## 2023-08-19 DIAGNOSIS — K219 Gastro-esophageal reflux disease without esophagitis: Secondary | ICD-10-CM | POA: Diagnosis not present

## 2023-08-19 DIAGNOSIS — K922 Gastrointestinal hemorrhage, unspecified: Secondary | ICD-10-CM | POA: Diagnosis not present

## 2023-08-19 DIAGNOSIS — M47812 Spondylosis without myelopathy or radiculopathy, cervical region: Secondary | ICD-10-CM | POA: Diagnosis not present

## 2023-08-19 DIAGNOSIS — F419 Anxiety disorder, unspecified: Secondary | ICD-10-CM | POA: Diagnosis not present

## 2023-08-19 DIAGNOSIS — D62 Acute posthemorrhagic anemia: Secondary | ICD-10-CM | POA: Diagnosis not present

## 2023-08-19 DIAGNOSIS — I10 Essential (primary) hypertension: Secondary | ICD-10-CM | POA: Diagnosis not present

## 2023-08-19 DIAGNOSIS — R911 Solitary pulmonary nodule: Secondary | ICD-10-CM | POA: Diagnosis not present

## 2023-08-19 DIAGNOSIS — M8580 Other specified disorders of bone density and structure, unspecified site: Secondary | ICD-10-CM | POA: Diagnosis not present

## 2023-08-21 DIAGNOSIS — K922 Gastrointestinal hemorrhage, unspecified: Secondary | ICD-10-CM | POA: Diagnosis not present

## 2023-08-21 DIAGNOSIS — I700 Atherosclerosis of aorta: Secondary | ICD-10-CM | POA: Diagnosis not present

## 2023-08-21 DIAGNOSIS — M47812 Spondylosis without myelopathy or radiculopathy, cervical region: Secondary | ICD-10-CM | POA: Diagnosis not present

## 2023-08-21 DIAGNOSIS — Z85038 Personal history of other malignant neoplasm of large intestine: Secondary | ICD-10-CM | POA: Diagnosis not present

## 2023-08-21 DIAGNOSIS — F419 Anxiety disorder, unspecified: Secondary | ICD-10-CM | POA: Diagnosis not present

## 2023-08-21 DIAGNOSIS — I2510 Atherosclerotic heart disease of native coronary artery without angina pectoris: Secondary | ICD-10-CM | POA: Diagnosis not present

## 2023-08-21 DIAGNOSIS — D62 Acute posthemorrhagic anemia: Secondary | ICD-10-CM | POA: Diagnosis not present

## 2023-08-21 DIAGNOSIS — R911 Solitary pulmonary nodule: Secondary | ICD-10-CM | POA: Diagnosis not present

## 2023-08-21 DIAGNOSIS — E785 Hyperlipidemia, unspecified: Secondary | ICD-10-CM | POA: Diagnosis not present

## 2023-08-21 DIAGNOSIS — S72011D Unspecified intracapsular fracture of right femur, subsequent encounter for closed fracture with routine healing: Secondary | ICD-10-CM | POA: Diagnosis not present

## 2023-08-21 DIAGNOSIS — M8580 Other specified disorders of bone density and structure, unspecified site: Secondary | ICD-10-CM | POA: Diagnosis not present

## 2023-08-21 DIAGNOSIS — K219 Gastro-esophageal reflux disease without esophagitis: Secondary | ICD-10-CM | POA: Diagnosis not present

## 2023-08-21 DIAGNOSIS — F32A Depression, unspecified: Secondary | ICD-10-CM | POA: Diagnosis not present

## 2023-08-21 DIAGNOSIS — I10 Essential (primary) hypertension: Secondary | ICD-10-CM | POA: Diagnosis not present

## 2023-08-27 DIAGNOSIS — M810 Age-related osteoporosis without current pathological fracture: Secondary | ICD-10-CM | POA: Diagnosis not present

## 2023-08-28 DIAGNOSIS — M25551 Pain in right hip: Secondary | ICD-10-CM | POA: Diagnosis not present

## 2023-08-30 DIAGNOSIS — F32A Depression, unspecified: Secondary | ICD-10-CM | POA: Diagnosis not present

## 2023-08-30 DIAGNOSIS — M47812 Spondylosis without myelopathy or radiculopathy, cervical region: Secondary | ICD-10-CM | POA: Diagnosis not present

## 2023-08-30 DIAGNOSIS — Z85038 Personal history of other malignant neoplasm of large intestine: Secondary | ICD-10-CM | POA: Diagnosis not present

## 2023-08-30 DIAGNOSIS — F419 Anxiety disorder, unspecified: Secondary | ICD-10-CM | POA: Diagnosis not present

## 2023-08-30 DIAGNOSIS — R911 Solitary pulmonary nodule: Secondary | ICD-10-CM | POA: Diagnosis not present

## 2023-08-30 DIAGNOSIS — I2510 Atherosclerotic heart disease of native coronary artery without angina pectoris: Secondary | ICD-10-CM | POA: Diagnosis not present

## 2023-08-30 DIAGNOSIS — K219 Gastro-esophageal reflux disease without esophagitis: Secondary | ICD-10-CM | POA: Diagnosis not present

## 2023-08-30 DIAGNOSIS — M8580 Other specified disorders of bone density and structure, unspecified site: Secondary | ICD-10-CM | POA: Diagnosis not present

## 2023-08-30 DIAGNOSIS — I700 Atherosclerosis of aorta: Secondary | ICD-10-CM | POA: Diagnosis not present

## 2023-08-30 DIAGNOSIS — D62 Acute posthemorrhagic anemia: Secondary | ICD-10-CM | POA: Diagnosis not present

## 2023-08-30 DIAGNOSIS — I10 Essential (primary) hypertension: Secondary | ICD-10-CM | POA: Diagnosis not present

## 2023-08-30 DIAGNOSIS — S72011D Unspecified intracapsular fracture of right femur, subsequent encounter for closed fracture with routine healing: Secondary | ICD-10-CM | POA: Diagnosis not present

## 2023-08-30 DIAGNOSIS — S72041D Displaced fracture of base of neck of right femur, subsequent encounter for closed fracture with routine healing: Secondary | ICD-10-CM | POA: Diagnosis not present

## 2023-08-30 DIAGNOSIS — E785 Hyperlipidemia, unspecified: Secondary | ICD-10-CM | POA: Diagnosis not present

## 2023-08-30 DIAGNOSIS — K922 Gastrointestinal hemorrhage, unspecified: Secondary | ICD-10-CM | POA: Diagnosis not present

## 2023-09-02 ENCOUNTER — Ambulatory Visit
Admission: RE | Admit: 2023-09-02 | Discharge: 2023-09-02 | Disposition: A | Discharge status: Home / Self Care | Payer: Medicare Other | Source: Ambulatory Visit | Attending: Family Medicine | Admitting: Family Medicine

## 2023-09-02 ENCOUNTER — Other Ambulatory Visit: Payer: Self-pay | Admitting: Family Medicine

## 2023-09-02 DIAGNOSIS — R600 Localized edema: Secondary | ICD-10-CM | POA: Diagnosis not present

## 2023-09-02 DIAGNOSIS — R911 Solitary pulmonary nodule: Secondary | ICD-10-CM | POA: Diagnosis not present

## 2023-09-02 DIAGNOSIS — R0989 Other specified symptoms and signs involving the circulatory and respiratory systems: Secondary | ICD-10-CM

## 2023-09-02 DIAGNOSIS — I351 Nonrheumatic aortic (valve) insufficiency: Secondary | ICD-10-CM | POA: Diagnosis not present

## 2023-09-02 DIAGNOSIS — R011 Cardiac murmur, unspecified: Secondary | ICD-10-CM | POA: Diagnosis not present

## 2023-09-04 DIAGNOSIS — F419 Anxiety disorder, unspecified: Secondary | ICD-10-CM | POA: Diagnosis not present

## 2023-09-04 DIAGNOSIS — K219 Gastro-esophageal reflux disease without esophagitis: Secondary | ICD-10-CM | POA: Diagnosis not present

## 2023-09-04 DIAGNOSIS — I700 Atherosclerosis of aorta: Secondary | ICD-10-CM | POA: Diagnosis not present

## 2023-09-04 DIAGNOSIS — E785 Hyperlipidemia, unspecified: Secondary | ICD-10-CM | POA: Diagnosis not present

## 2023-09-04 DIAGNOSIS — M47812 Spondylosis without myelopathy or radiculopathy, cervical region: Secondary | ICD-10-CM | POA: Diagnosis not present

## 2023-09-04 DIAGNOSIS — I10 Essential (primary) hypertension: Secondary | ICD-10-CM | POA: Diagnosis not present

## 2023-09-04 DIAGNOSIS — M8580 Other specified disorders of bone density and structure, unspecified site: Secondary | ICD-10-CM | POA: Diagnosis not present

## 2023-09-04 DIAGNOSIS — R911 Solitary pulmonary nodule: Secondary | ICD-10-CM | POA: Diagnosis not present

## 2023-09-04 DIAGNOSIS — S72011D Unspecified intracapsular fracture of right femur, subsequent encounter for closed fracture with routine healing: Secondary | ICD-10-CM | POA: Diagnosis not present

## 2023-09-04 DIAGNOSIS — K922 Gastrointestinal hemorrhage, unspecified: Secondary | ICD-10-CM | POA: Diagnosis not present

## 2023-09-04 DIAGNOSIS — F32A Depression, unspecified: Secondary | ICD-10-CM | POA: Diagnosis not present

## 2023-09-04 DIAGNOSIS — I2510 Atherosclerotic heart disease of native coronary artery without angina pectoris: Secondary | ICD-10-CM | POA: Diagnosis not present

## 2023-09-04 DIAGNOSIS — D62 Acute posthemorrhagic anemia: Secondary | ICD-10-CM | POA: Diagnosis not present

## 2023-09-04 DIAGNOSIS — Z85038 Personal history of other malignant neoplasm of large intestine: Secondary | ICD-10-CM | POA: Diagnosis not present

## 2023-09-06 DIAGNOSIS — E785 Hyperlipidemia, unspecified: Secondary | ICD-10-CM | POA: Diagnosis not present

## 2023-09-06 DIAGNOSIS — D62 Acute posthemorrhagic anemia: Secondary | ICD-10-CM | POA: Diagnosis not present

## 2023-09-06 DIAGNOSIS — I700 Atherosclerosis of aorta: Secondary | ICD-10-CM | POA: Diagnosis not present

## 2023-09-06 DIAGNOSIS — Z85038 Personal history of other malignant neoplasm of large intestine: Secondary | ICD-10-CM | POA: Diagnosis not present

## 2023-09-06 DIAGNOSIS — K219 Gastro-esophageal reflux disease without esophagitis: Secondary | ICD-10-CM | POA: Diagnosis not present

## 2023-09-06 DIAGNOSIS — R911 Solitary pulmonary nodule: Secondary | ICD-10-CM | POA: Diagnosis not present

## 2023-09-06 DIAGNOSIS — I10 Essential (primary) hypertension: Secondary | ICD-10-CM | POA: Diagnosis not present

## 2023-09-06 DIAGNOSIS — K922 Gastrointestinal hemorrhage, unspecified: Secondary | ICD-10-CM | POA: Diagnosis not present

## 2023-09-06 DIAGNOSIS — F32A Depression, unspecified: Secondary | ICD-10-CM | POA: Diagnosis not present

## 2023-09-06 DIAGNOSIS — S72011D Unspecified intracapsular fracture of right femur, subsequent encounter for closed fracture with routine healing: Secondary | ICD-10-CM | POA: Diagnosis not present

## 2023-09-06 DIAGNOSIS — M47812 Spondylosis without myelopathy or radiculopathy, cervical region: Secondary | ICD-10-CM | POA: Diagnosis not present

## 2023-09-06 DIAGNOSIS — I2510 Atherosclerotic heart disease of native coronary artery without angina pectoris: Secondary | ICD-10-CM | POA: Diagnosis not present

## 2023-09-06 DIAGNOSIS — M8580 Other specified disorders of bone density and structure, unspecified site: Secondary | ICD-10-CM | POA: Diagnosis not present

## 2023-09-06 DIAGNOSIS — F419 Anxiety disorder, unspecified: Secondary | ICD-10-CM | POA: Diagnosis not present

## 2023-09-09 DIAGNOSIS — K219 Gastro-esophageal reflux disease without esophagitis: Secondary | ICD-10-CM | POA: Diagnosis not present

## 2023-09-09 DIAGNOSIS — I2510 Atherosclerotic heart disease of native coronary artery without angina pectoris: Secondary | ICD-10-CM | POA: Diagnosis not present

## 2023-09-09 DIAGNOSIS — E785 Hyperlipidemia, unspecified: Secondary | ICD-10-CM | POA: Diagnosis not present

## 2023-09-09 DIAGNOSIS — D62 Acute posthemorrhagic anemia: Secondary | ICD-10-CM | POA: Diagnosis not present

## 2023-09-09 DIAGNOSIS — R911 Solitary pulmonary nodule: Secondary | ICD-10-CM | POA: Diagnosis not present

## 2023-09-09 DIAGNOSIS — I10 Essential (primary) hypertension: Secondary | ICD-10-CM | POA: Diagnosis not present

## 2023-09-09 DIAGNOSIS — M8580 Other specified disorders of bone density and structure, unspecified site: Secondary | ICD-10-CM | POA: Diagnosis not present

## 2023-09-09 DIAGNOSIS — M47812 Spondylosis without myelopathy or radiculopathy, cervical region: Secondary | ICD-10-CM | POA: Diagnosis not present

## 2023-09-09 DIAGNOSIS — F32A Depression, unspecified: Secondary | ICD-10-CM | POA: Diagnosis not present

## 2023-09-09 DIAGNOSIS — Z85038 Personal history of other malignant neoplasm of large intestine: Secondary | ICD-10-CM | POA: Diagnosis not present

## 2023-09-09 DIAGNOSIS — I700 Atherosclerosis of aorta: Secondary | ICD-10-CM | POA: Diagnosis not present

## 2023-09-09 DIAGNOSIS — K922 Gastrointestinal hemorrhage, unspecified: Secondary | ICD-10-CM | POA: Diagnosis not present

## 2023-09-09 DIAGNOSIS — F419 Anxiety disorder, unspecified: Secondary | ICD-10-CM | POA: Diagnosis not present

## 2023-09-09 DIAGNOSIS — R600 Localized edema: Secondary | ICD-10-CM | POA: Diagnosis not present

## 2023-09-09 DIAGNOSIS — S72011D Unspecified intracapsular fracture of right femur, subsequent encounter for closed fracture with routine healing: Secondary | ICD-10-CM | POA: Diagnosis not present

## 2023-09-10 DIAGNOSIS — K922 Gastrointestinal hemorrhage, unspecified: Secondary | ICD-10-CM | POA: Diagnosis not present

## 2023-09-10 DIAGNOSIS — M47812 Spondylosis without myelopathy or radiculopathy, cervical region: Secondary | ICD-10-CM | POA: Diagnosis not present

## 2023-09-10 DIAGNOSIS — S72011D Unspecified intracapsular fracture of right femur, subsequent encounter for closed fracture with routine healing: Secondary | ICD-10-CM | POA: Diagnosis not present

## 2023-09-10 DIAGNOSIS — Z85038 Personal history of other malignant neoplasm of large intestine: Secondary | ICD-10-CM | POA: Diagnosis not present

## 2023-09-10 DIAGNOSIS — E785 Hyperlipidemia, unspecified: Secondary | ICD-10-CM | POA: Diagnosis not present

## 2023-09-10 DIAGNOSIS — I700 Atherosclerosis of aorta: Secondary | ICD-10-CM | POA: Diagnosis not present

## 2023-09-10 DIAGNOSIS — K219 Gastro-esophageal reflux disease without esophagitis: Secondary | ICD-10-CM | POA: Diagnosis not present

## 2023-09-10 DIAGNOSIS — F32A Depression, unspecified: Secondary | ICD-10-CM | POA: Diagnosis not present

## 2023-09-10 DIAGNOSIS — I2510 Atherosclerotic heart disease of native coronary artery without angina pectoris: Secondary | ICD-10-CM | POA: Diagnosis not present

## 2023-09-10 DIAGNOSIS — I10 Essential (primary) hypertension: Secondary | ICD-10-CM | POA: Diagnosis not present

## 2023-09-10 DIAGNOSIS — F419 Anxiety disorder, unspecified: Secondary | ICD-10-CM | POA: Diagnosis not present

## 2023-09-10 DIAGNOSIS — R911 Solitary pulmonary nodule: Secondary | ICD-10-CM | POA: Diagnosis not present

## 2023-09-10 DIAGNOSIS — S72041D Displaced fracture of base of neck of right femur, subsequent encounter for closed fracture with routine healing: Secondary | ICD-10-CM | POA: Diagnosis not present

## 2023-09-10 DIAGNOSIS — M8580 Other specified disorders of bone density and structure, unspecified site: Secondary | ICD-10-CM | POA: Diagnosis not present

## 2023-09-10 DIAGNOSIS — D62 Acute posthemorrhagic anemia: Secondary | ICD-10-CM | POA: Diagnosis not present

## 2023-09-13 ENCOUNTER — Emergency Department (HOSPITAL_BASED_OUTPATIENT_CLINIC_OR_DEPARTMENT_OTHER): Payer: Medicare Other

## 2023-09-13 ENCOUNTER — Encounter (HOSPITAL_BASED_OUTPATIENT_CLINIC_OR_DEPARTMENT_OTHER): Payer: Self-pay

## 2023-09-13 ENCOUNTER — Emergency Department (HOSPITAL_BASED_OUTPATIENT_CLINIC_OR_DEPARTMENT_OTHER)
Admission: EM | Admit: 2023-09-13 | Discharge: 2023-09-13 | Disposition: A | Discharge status: Home / Self Care | Payer: Medicare Other | Attending: Emergency Medicine | Admitting: Emergency Medicine

## 2023-09-13 ENCOUNTER — Other Ambulatory Visit: Payer: Self-pay

## 2023-09-13 DIAGNOSIS — K529 Noninfective gastroenteritis and colitis, unspecified: Secondary | ICD-10-CM | POA: Insufficient documentation

## 2023-09-13 DIAGNOSIS — R188 Other ascites: Secondary | ICD-10-CM | POA: Diagnosis not present

## 2023-09-13 DIAGNOSIS — N200 Calculus of kidney: Secondary | ICD-10-CM | POA: Diagnosis not present

## 2023-09-13 DIAGNOSIS — N3000 Acute cystitis without hematuria: Secondary | ICD-10-CM | POA: Diagnosis not present

## 2023-09-13 DIAGNOSIS — K5900 Constipation, unspecified: Secondary | ICD-10-CM | POA: Diagnosis not present

## 2023-09-13 DIAGNOSIS — K5989 Other specified functional intestinal disorders: Secondary | ICD-10-CM | POA: Diagnosis not present

## 2023-09-13 LAB — LIPASE, BLOOD: Lipase: 16 U/L (ref 11–51)

## 2023-09-13 LAB — CBC WITH DIFFERENTIAL/PLATELET
Abs Immature Granulocytes: 0.03 10*3/uL (ref 0.00–0.07)
Basophils Absolute: 0 10*3/uL (ref 0.0–0.1)
Basophils Relative: 0 %
Eosinophils Absolute: 0.1 10*3/uL (ref 0.0–0.5)
Eosinophils Relative: 1 %
HCT: 38.1 % (ref 36.0–46.0)
Hemoglobin: 12.1 g/dL (ref 12.0–15.0)
Immature Granulocytes: 0 %
Lymphocytes Relative: 16 %
Lymphs Abs: 1.2 10*3/uL (ref 0.7–4.0)
MCH: 29 pg (ref 26.0–34.0)
MCHC: 31.8 g/dL (ref 30.0–36.0)
MCV: 91.4 fL (ref 80.0–100.0)
Monocytes Absolute: 0.6 10*3/uL (ref 0.1–1.0)
Monocytes Relative: 8 %
Neutro Abs: 5.5 10*3/uL (ref 1.7–7.7)
Neutrophils Relative %: 75 %
Platelets: 421 10*3/uL — ABNORMAL HIGH (ref 150–400)
RBC: 4.17 MIL/uL (ref 3.87–5.11)
RDW: 13.6 % (ref 11.5–15.5)
WBC: 7.4 10*3/uL (ref 4.0–10.5)
nRBC: 0 % (ref 0.0–0.2)

## 2023-09-13 LAB — COMPREHENSIVE METABOLIC PANEL
ALT: 11 U/L (ref 0–44)
AST: 22 U/L (ref 15–41)
Albumin: 4.3 g/dL (ref 3.5–5.0)
Alkaline Phosphatase: 72 U/L (ref 38–126)
Anion gap: 10 (ref 5–15)
BUN: 27 mg/dL — ABNORMAL HIGH (ref 8–23)
CO2: 27 mmol/L (ref 22–32)
Calcium: 9.1 mg/dL (ref 8.9–10.3)
Chloride: 98 mmol/L (ref 98–111)
Creatinine, Ser: 0.5 mg/dL (ref 0.44–1.00)
GFR, Estimated: >60 mL/min (ref >60)
Glucose, Bld: 95 mg/dL (ref 70–99)
Potassium: 3.9 mmol/L (ref 3.5–5.1)
Sodium: 135 mmol/L (ref 135–145)
Total Bilirubin: 0.4 mg/dL (ref <1.2)
Total Protein: 7.5 g/dL (ref 6.5–8.1)

## 2023-09-13 LAB — URINALYSIS, ROUTINE W REFLEX MICROSCOPIC
Bilirubin Urine: NEGATIVE
Glucose, UA: NEGATIVE mg/dL
Hgb urine dipstick: NEGATIVE
Ketones, ur: 40 mg/dL — AB
Nitrite: POSITIVE — AB
Protein, ur: 30 mg/dL — AB
Specific Gravity, Urine: 1.025 (ref 1.005–1.030)
pH: 5.5 (ref 5.0–8.0)

## 2023-09-13 MED ORDER — METRONIDAZOLE 500 MG PO TABS: 500.0000 mg | ORAL_TABLET | Freq: Two times a day (BID) | ORAL | 0 refills | Status: DC

## 2023-09-13 MED ORDER — METRONIDAZOLE 500 MG PO TABS
500.0000 mg | ORAL_TABLET | Freq: Once | ORAL | Status: AC
Start: 2023-09-13 — End: 2023-09-13
  Administered 2023-09-13: 500 mg via ORAL
  Filled 2023-09-13: qty 1

## 2023-09-13 MED ORDER — IOHEXOL 300 MG/ML  SOLN
100.0000 mL | Freq: Once | INTRAMUSCULAR | Status: AC | PRN
  Administered 2023-09-13: 100 mL via INTRAVENOUS

## 2023-09-13 MED ORDER — ONDANSETRON 4 MG PO TBDP: 4.0000 mg | ORAL_TABLET | Freq: Three times a day (TID) | ORAL | 0 refills | Status: DC | PRN

## 2023-09-13 MED ORDER — CIPROFLOXACIN HCL 500 MG PO TABS
500.0000 mg | ORAL_TABLET | Freq: Once | ORAL | Status: AC
Start: 2023-09-13 — End: 2023-09-13
  Administered 2023-09-13: 500 mg via ORAL
  Filled 2023-09-13: qty 1

## 2023-09-13 MED ORDER — CIPROFLOXACIN HCL 500 MG PO TABS
500.0000 mg | ORAL_TABLET | Freq: Two times a day (BID) | ORAL | 0 refills | Status: AC
Start: 2023-09-13 — End: 2023-09-20

## 2023-09-13 NOTE — Discharge Instructions (Signed)
Take antibiotics as prescribed.  Please return if symptoms worsen including fever, nausea and vomiting, worsening pain.  Overall it looks like you have a urinary tract infection and some inflammation of your colon.  Please return if symptoms worsen as discussed.  Follow-up closely with your primary care doctor.  Please avoid any stool softeners or enemas or anything that could cause diarrhea.  I recommend stopping any suppositories or Colace or Metamucil for the time being.

## 2023-09-13 NOTE — ED Provider Notes (Signed)
[place] EMERGENCY DEPARTMENT AT [facility] Provider Note   CSN: [id number] Arrival date & time: 09/13/23  1549     History  Chief Complaint  Patient presents with   Constipation    Frances Ortiz is a 89 y.o. female.  Here with abdominal pain.  Had some loose stools yesterday maybe 1 today but was concerned that she might be constipated maybe has a bowel obstruction.  She has had her gallbladder removed in the past and has had some C-sections.  She denies any nausea or vomiting.  She denies any difficulty urinating.  Denies any back pain weakness numbness tingling.  Denies any chest pain or shortness of breath.  The history is provided by the patient.       Home Medications Prior to Admission medications   Medication Sig Start Date End Date Taking? Authorizing Provider  ciprofloxacin (CIPRO) 500 MG tablet Take 1 tablet (500 mg total) by mouth every 12 (twelve) hours for 7 days. 09/13/23 09/20/23 Yes Cardale Dorer, DO  metroNIDAZOLE (FLAGYL) 500 MG tablet Take 1 tablet (500 mg total) by mouth 2 (two) times daily. 09/13/23  Yes Lashelle Koy, DO  ondansetron (ZOFRAN-ODT) 4 MG disintegrating tablet Take 1 tablet (4 mg total) by mouth every 8 (eight) hours as needed. 09/13/23  Yes Niam Nepomuceno, DO  acetaminophen (TYLENOL) 650 MG CR tablet Take 650 mg by mouth 3 (three) times daily as needed for pain.    [provider]  bisacodyl (DULCOLAX) 10 MG suppository Place 1 suppository (10 mg total) rectally daily as needed for moderate constipation. 06/25/23   Leroy Sea, MD  docusate sodium (COLACE) 100 MG capsule Take 1 capsule (100 mg total) by mouth 2 (two) times daily. 06/25/23   Leroy Sea, MD  enoxaparin (LOVENOX) 40 MG/0.4ML injection Inject 0.4 mLs (40 mg total) into the skin daily for 30 doses. For 30 days post op for DVT prophylaxis 06/23/23 07/23/23  Cecil Cobbs, PA-C  ferrous sulfate 325 (65 FE) MG tablet Take 1 tablet (325 mg total) by  mouth daily with breakfast. 06/25/23   Leroy Sea, MD  Metamucil Fiber CHEW Chew 3 tablets by mouth daily.    [provider]  pantoprazole (PROTONIX) 40 MG tablet Take 1 tablet (40 mg total) by mouth 2 (two) times daily. 07/16/23   Azucena Fallen, MD  polyethylene glycol (MIRALAX / GLYCOLAX) 17 g packet Take 17 g by mouth daily as needed for mild constipation. 06/25/23   Leroy Sea, MD  sertraline (ZOLOFT) 50 MG tablet Take 50 mg by mouth daily.    [provider]  traMADol (ULTRAM) 50 MG tablet Take 1 tablet (50 mg total) by mouth every 8 (eight) hours as needed for moderate pain. 06/23/23   Cecil Cobbs, PA-C      Allergies    Aristocort [triamcinolone], Cortisone, Erythromycin, Other, and Hydrocodone-acetaminophen    Review of Systems   Review of Systems  Physical Exam Updated Vital Signs BP (!) 143/69   Pulse 94   Temp 98.1 F (36.7 C)   Resp 20   Ht 4\' 10"  (1.473 m)   Wt 54.4 kg   SpO2 95%   BMI 25.07 kg/m  Physical Exam Vitals and nursing note reviewed.  Constitutional:      General: She is not in acute distress.    Appearance: She is well-developed. She is not ill-appearing.  HENT:     Head: Normocephalic and atraumatic.  Nose: Nose normal.     Mouth/Throat:     Mouth: Mucous membranes are moist.  Eyes:     Extraocular Movements: Extraocular movements intact.     Conjunctiva/sclera: Conjunctivae normal.     Pupils: Pupils are equal, round, and reactive to light.  Cardiovascular:     Rate and Rhythm: Normal rate and regular rhythm.     Heart sounds: No murmur heard. Pulmonary:     Effort: Pulmonary effort is normal. No respiratory distress.     Breath sounds: Normal breath sounds.  Abdominal:     General: There is distension.     Palpations: Abdomen is soft.     Tenderness: There is abdominal tenderness.  Musculoskeletal:        General: No swelling.     Cervical back: Normal range of motion and neck supple.  Skin:     General: Skin is warm and dry.     Capillary Refill: Capillary refill takes less than 2 seconds.  Neurological:     General: No focal deficit present.     Mental Status: She is alert.  Psychiatric:        Mood and Affect: Mood normal.     ED Results / Procedures / Treatments   Labs (all labs ordered are listed, but only abnormal results are displayed) Labs Reviewed  CBC WITH DIFFERENTIAL/PLATELET - Abnormal; Notable for the following components:      Result Value   Platelets 421 (*)    All other components within normal limits  COMPREHENSIVE METABOLIC PANEL - Abnormal; Notable for the following components:   BUN 27 (*)    All other components within normal limits  URINALYSIS, ROUTINE W REFLEX MICROSCOPIC - Abnormal; Notable for the following components:   Ketones, ur 40 (*)    Protein, ur 30 (*)    Nitrite POSITIVE (*)    Leukocytes,Ua LARGE (*)    Bacteria, UA MANY (*)    All other components within normal limits  URINE CULTURE  LIPASE, BLOOD    EKG None  Radiology CT ABDOMEN PELVIS W CONTRAST  Result Date: 09/13/2023 CLINICAL DATA:  Acute nonlocalized abdominal pain EXAM: CT ABDOMEN AND PELVIS WITH CONTRAST TECHNIQUE: Multidetector CT imaging of the abdomen and pelvis was performed using the standard protocol following bolus administration of intravenous contrast. RADIATION DOSE REDUCTION: This exam was performed according to the departmental dose-optimization program which includes automated exposure control, adjustment of the mA and/or kV according to patient size and/or use of iterative reconstruction technique. CONTRAST:  OMNIPAQUE IOHEXOL 300 MG/ML  SOLN COMPARISON:  07/12/2023 FINDINGS: Lower chest: No acute abnormality. Moderate coronary artery calcification. Small hiatal hernia. Hepatobiliary: Tiny probable cyst within the left hepatic lobe again noted. Liver otherwise unremarkable. No intra or extrahepatic biliary ductal dilation. Status post cholecystectomy.  Pancreas: Unremarkable. No pancreatic ductal dilatation or surrounding inflammatory changes. Spleen: Normal in size without focal abnormality. Adrenals/Urinary Tract: 18 mm nodule within the right adrenal gland appears stable since remote prior examination of 10/18/2017 and is compatible with a benign adrenal adenoma given its stability over time. No follow-up imaging is recommended for this lesion. Left adrenal gland is unremarkable. Multiple simple parapelvic cysts are seen bilaterally for which no follow-up imaging is recommended. Simple cortical cyst within the interpolar region of the left kidney are stable and no follow-up imaging is recommended for these lesions. Mild bilateral renal cortical atrophy. 5 mm nonobstructing calculus noted within the lower pole of the left kidney, unchanged. No hydronephrosis.  No ureteral calculi. The bladder is partially obscured by streak artifact, however, the visualized portion is unremarkable. Stomach/Bowel: Surgical changes of right hemicolectomy are identified. The mid and distal small bowel as well as the ascending and transverse colon are fluid-filled with mild distension of the small bowel and diffuse hyperemia suggesting changes of a diffuse infectious or inflammatory enterocolitis. There is no evidence of obstruction or perforation. No evidence of a bowel ischemia. No free intraperitoneal gas. Mild ascites. The descending and sigmoid colon are decompressed with superimposed moderate diverticulosis noted. Stomach is unremarkable. Vascular/Lymphatic: Aortic atherosclerosis. No enlarged abdominal or pelvic lymph nodes. Reproductive: Obscured by streak artifact Other: No abdominal wall hernia Musculoskeletal: Right hip bipolar hemiarthroplasty noted. The osseous structures are diffusely osteopenic with stable compression deformities of L1-L5 and evidence of prior vertebroplasty at L2 and L3. Advanced, superimposed degenerative changes within the lumbar spine with  multilevel vacuum disc phenomena and grade 1 anterolisthesis L4-5. No acute bone abnormality. No lytic or blastic bone lesion. IMPRESSION: 1. Fluid-filled mildly distended mid and distal small bowel as well as the ascending and transverse colon with diffuse hyperemia suggesting changes of a diffuse infectious or inflammatory enterocolitis. Mild ascites. No evidence of obstruction or perforation. 2. Surgical changes of right hemicolectomy. 3. Moderate coronary artery calcification. 4. Stable 18 mm right adrenal adenoma. 5. Stable nonobstructing left renal calculus. Aortic Atherosclerosis (ICD10-I70.0). Electronically Signed   By: Helyn Numbers M.D.   On: 09/13/2023 20:28    Procedures Procedures    Medications Ordered in ED Medications  ciprofloxacin (CIPRO) tablet 500 mg (has no administration in time range)  metroNIDAZOLE (FLAGYL) tablet 500 mg (has no administration in time range)  iohexol (OMNIPAQUE) 300 MG/ML solution 100 mL (100 mLs Intravenous Contrast Given 09/13/23 1920)    ED Course/ Medical Decision Making/ A&P                                 Medical Decision Making Amount and/or Complexity of Data Reviewed Labs: ordered. Radiology: ordered.  Risk Prescription drug management.   Frances Ortiz is here with abdominal pain.  Per chart review has a history of bowel obstruction, high cholesterol.  Multiple abdominal surgeries in the past.  Differential diagnosis includes bowel obstruction versus constipation versus less likely infectious process.  Will get CT scan abdomen pelvis check basic labs.  She is not having any urinary symptoms.  Urinalysis looks consistent with infection but patient has no leukocytosis anemia or electrolyte abnormality kidney injury per my review and interpretation of labs.  CT scan consistent with infectious or inflammatory enterocolitis.  She has been describing more bloating and diarrhea symptoms.  I do suspect that there is some  infectious/inflammatory process going on.  Overall she feels comfortable and would like to try to take antibiotics for home.  I offered her admission for pain control and maybe some antibiotics and make sure she is improving but clinically she appears well.  Vitals are reassuring and labs are reassuring.  Ultimately decision was made to give her ciprofloxacin and Flagyl as I think it would cover for both factious colitis as well as UTI.  Patient was also prescribed Zofran.  She understands return precautions.  Discharged in good condition.  This chart was dictated using voice recognition software.  Despite best efforts to proofread,  errors can occur which can change the documentation meaning.         Final Clinical Impression(s) /  ED Diagnoses Final diagnoses:  Colitis  Acute cystitis without hematuria    Rx / DC Orders ED Discharge Orders          Ordered    ciprofloxacin (CIPRO) 500 MG tablet  Every 12 hours        09/13/23 2101    metroNIDAZOLE (FLAGYL) 500 MG tablet  2 times daily        09/13/23 2101    ondansetron (ZOFRAN-ODT) 4 MG disintegrating tablet  Every 8 hours PRN        09/13/23 2112              Virgina Norfolk, DO 09/13/23 2116

## 2023-09-13 NOTE — ED Triage Notes (Signed)
Patient arrives with complaints of constipation x4 days. Patient took OTC and a suppository with no relief. She did have one watery stool yesterday. Patient was sent her to rule out an obstruction in her bowel.

## 2023-09-13 NOTE — ED Notes (Signed)
Called lab to add urine culture, spoke with Myriam Jacobson.

## 2023-09-13 NOTE — ED Notes (Signed)
FALL RISK BAND placed on pt's left wrist. Yellow grip socks placed on pt's feet and FALL RISK sign placed.

## 2023-09-16 LAB — URINE CULTURE: Culture: >=100000 — AB

## 2023-09-17 ENCOUNTER — Telehealth (HOSPITAL_BASED_OUTPATIENT_CLINIC_OR_DEPARTMENT_OTHER): Payer: Self-pay | Admitting: *Deleted

## 2023-09-17 DIAGNOSIS — R911 Solitary pulmonary nodule: Secondary | ICD-10-CM | POA: Diagnosis not present

## 2023-09-17 DIAGNOSIS — I2510 Atherosclerotic heart disease of native coronary artery without angina pectoris: Secondary | ICD-10-CM | POA: Diagnosis not present

## 2023-09-17 DIAGNOSIS — S72011D Unspecified intracapsular fracture of right femur, subsequent encounter for closed fracture with routine healing: Secondary | ICD-10-CM | POA: Diagnosis not present

## 2023-09-17 DIAGNOSIS — K922 Gastrointestinal hemorrhage, unspecified: Secondary | ICD-10-CM | POA: Diagnosis not present

## 2023-09-17 DIAGNOSIS — I700 Atherosclerosis of aorta: Secondary | ICD-10-CM | POA: Diagnosis not present

## 2023-09-17 DIAGNOSIS — F419 Anxiety disorder, unspecified: Secondary | ICD-10-CM | POA: Diagnosis not present

## 2023-09-17 DIAGNOSIS — I10 Essential (primary) hypertension: Secondary | ICD-10-CM | POA: Diagnosis not present

## 2023-09-17 DIAGNOSIS — Z85038 Personal history of other malignant neoplasm of large intestine: Secondary | ICD-10-CM | POA: Diagnosis not present

## 2023-09-17 DIAGNOSIS — M47812 Spondylosis without myelopathy or radiculopathy, cervical region: Secondary | ICD-10-CM | POA: Diagnosis not present

## 2023-09-17 DIAGNOSIS — K219 Gastro-esophageal reflux disease without esophagitis: Secondary | ICD-10-CM | POA: Diagnosis not present

## 2023-09-17 DIAGNOSIS — M8580 Other specified disorders of bone density and structure, unspecified site: Secondary | ICD-10-CM | POA: Diagnosis not present

## 2023-09-17 DIAGNOSIS — F32A Depression, unspecified: Secondary | ICD-10-CM | POA: Diagnosis not present

## 2023-09-17 DIAGNOSIS — D62 Acute posthemorrhagic anemia: Secondary | ICD-10-CM | POA: Diagnosis not present

## 2023-09-17 DIAGNOSIS — E785 Hyperlipidemia, unspecified: Secondary | ICD-10-CM | POA: Diagnosis not present

## 2023-09-17 NOTE — Telephone Encounter (Signed)
Post ED Visit - Positive Culture Follow-up  Culture report reviewed by antimicrobial stewardship pharmacist: [facility] [x]  Estill Batten, Pharm.D. []  Celedonio Miyamoto, [address].D., BCPS AQ-ID []  Garvin Fila, Pharm.D., BCPS []  Georgina Pillion, Pharm.D., BCPS []  [place], [address].D., BCPS, AAHIVP []  Estella Husk, Pharm.D., BCPS, AAHIVP []  Lysle Pearl, PharmD, BCPS []  Phillips Climes, PharmD, BCPS []  Agapito Games, PharmD, BCPS []  Verlan Friends, PharmD []  Mervyn Gay, PharmD, BCPS []  Vinnie Level, PharmD  [facility] []  Len Childs, PharmD []  Greer Pickerel, PharmD []  Adalberto Cole, PharmD []  Perlie Gold, Rph []  Lonell Face) Jean Rosenthal, PharmD []  Earl Many, PharmD []  Junita Push, PharmD []  Dorna Leitz, PharmD []  Terrilee Files, PharmD []  Lynann Beaver, PharmD []  Keturah Barre, PharmD []  Loralee Pacas, PharmD []  Bernadene Person, PharmD   Positive urine culture Treated with Ciprofloxacin, organism sensitive to the same and no further patient follow-up is required at this time.  Virl Axe [facility] 09/17/2023, 10:21 AM

## 2023-09-18 DIAGNOSIS — N390 Urinary tract infection, site not specified: Secondary | ICD-10-CM | POA: Diagnosis not present

## 2023-09-18 DIAGNOSIS — R600 Localized edema: Secondary | ICD-10-CM | POA: Diagnosis not present

## 2023-09-18 DIAGNOSIS — K5900 Constipation, unspecified: Secondary | ICD-10-CM | POA: Diagnosis not present

## 2023-09-18 DIAGNOSIS — K529 Noninfective gastroenteritis and colitis, unspecified: Secondary | ICD-10-CM | POA: Diagnosis not present

## 2023-09-23 DIAGNOSIS — K922 Gastrointestinal hemorrhage, unspecified: Secondary | ICD-10-CM | POA: Diagnosis not present

## 2023-09-23 DIAGNOSIS — S72011D Unspecified intracapsular fracture of right femur, subsequent encounter for closed fracture with routine healing: Secondary | ICD-10-CM | POA: Diagnosis not present

## 2023-09-23 DIAGNOSIS — F32A Depression, unspecified: Secondary | ICD-10-CM | POA: Diagnosis not present

## 2023-09-23 DIAGNOSIS — I700 Atherosclerosis of aorta: Secondary | ICD-10-CM | POA: Diagnosis not present

## 2023-09-23 DIAGNOSIS — F419 Anxiety disorder, unspecified: Secondary | ICD-10-CM | POA: Diagnosis not present

## 2023-09-23 DIAGNOSIS — E785 Hyperlipidemia, unspecified: Secondary | ICD-10-CM | POA: Diagnosis not present

## 2023-09-23 DIAGNOSIS — M8580 Other specified disorders of bone density and structure, unspecified site: Secondary | ICD-10-CM | POA: Diagnosis not present

## 2023-09-23 DIAGNOSIS — Z85038 Personal history of other malignant neoplasm of large intestine: Secondary | ICD-10-CM | POA: Diagnosis not present

## 2023-09-23 DIAGNOSIS — R911 Solitary pulmonary nodule: Secondary | ICD-10-CM | POA: Diagnosis not present

## 2023-09-23 DIAGNOSIS — M47812 Spondylosis without myelopathy or radiculopathy, cervical region: Secondary | ICD-10-CM | POA: Diagnosis not present

## 2023-09-23 DIAGNOSIS — K219 Gastro-esophageal reflux disease without esophagitis: Secondary | ICD-10-CM | POA: Diagnosis not present

## 2023-09-23 DIAGNOSIS — D62 Acute posthemorrhagic anemia: Secondary | ICD-10-CM | POA: Diagnosis not present

## 2023-09-23 DIAGNOSIS — I2510 Atherosclerotic heart disease of native coronary artery without angina pectoris: Secondary | ICD-10-CM | POA: Diagnosis not present

## 2023-09-23 DIAGNOSIS — I10 Essential (primary) hypertension: Secondary | ICD-10-CM | POA: Diagnosis not present

## 2023-09-25 DIAGNOSIS — M545 Low back pain, unspecified: Secondary | ICD-10-CM | POA: Diagnosis not present

## 2023-10-02 DIAGNOSIS — I10 Essential (primary) hypertension: Secondary | ICD-10-CM | POA: Diagnosis not present

## 2023-10-02 DIAGNOSIS — I2510 Atherosclerotic heart disease of native coronary artery without angina pectoris: Secondary | ICD-10-CM | POA: Diagnosis not present

## 2023-10-02 DIAGNOSIS — M47812 Spondylosis without myelopathy or radiculopathy, cervical region: Secondary | ICD-10-CM | POA: Diagnosis not present

## 2023-10-02 DIAGNOSIS — Z87440 Personal history of urinary (tract) infections: Secondary | ICD-10-CM | POA: Diagnosis not present

## 2023-10-02 DIAGNOSIS — F419 Anxiety disorder, unspecified: Secondary | ICD-10-CM | POA: Diagnosis not present

## 2023-10-02 DIAGNOSIS — S72011D Unspecified intracapsular fracture of right femur, subsequent encounter for closed fracture with routine healing: Secondary | ICD-10-CM | POA: Diagnosis not present

## 2023-10-02 DIAGNOSIS — I700 Atherosclerosis of aorta: Secondary | ICD-10-CM | POA: Diagnosis not present

## 2023-10-02 DIAGNOSIS — M8580 Other specified disorders of bone density and structure, unspecified site: Secondary | ICD-10-CM | POA: Diagnosis not present

## 2023-10-02 DIAGNOSIS — E785 Hyperlipidemia, unspecified: Secondary | ICD-10-CM | POA: Diagnosis not present

## 2023-10-02 DIAGNOSIS — Z85038 Personal history of other malignant neoplasm of large intestine: Secondary | ICD-10-CM | POA: Diagnosis not present

## 2023-10-02 DIAGNOSIS — F32A Depression, unspecified: Secondary | ICD-10-CM | POA: Diagnosis not present

## 2023-10-02 DIAGNOSIS — K219 Gastro-esophageal reflux disease without esophagitis: Secondary | ICD-10-CM | POA: Diagnosis not present

## 2023-10-08 DIAGNOSIS — F419 Anxiety disorder, unspecified: Secondary | ICD-10-CM | POA: Diagnosis not present

## 2023-10-08 DIAGNOSIS — I700 Atherosclerosis of aorta: Secondary | ICD-10-CM | POA: Diagnosis not present

## 2023-10-08 DIAGNOSIS — Z87440 Personal history of urinary (tract) infections: Secondary | ICD-10-CM | POA: Diagnosis not present

## 2023-10-08 DIAGNOSIS — I10 Essential (primary) hypertension: Secondary | ICD-10-CM | POA: Diagnosis not present

## 2023-10-08 DIAGNOSIS — I2510 Atherosclerotic heart disease of native coronary artery without angina pectoris: Secondary | ICD-10-CM | POA: Diagnosis not present

## 2023-10-08 DIAGNOSIS — K219 Gastro-esophageal reflux disease without esophagitis: Secondary | ICD-10-CM | POA: Diagnosis not present

## 2023-10-08 DIAGNOSIS — M47812 Spondylosis without myelopathy or radiculopathy, cervical region: Secondary | ICD-10-CM | POA: Diagnosis not present

## 2023-10-08 DIAGNOSIS — M8580 Other specified disorders of bone density and structure, unspecified site: Secondary | ICD-10-CM | POA: Diagnosis not present

## 2023-10-08 DIAGNOSIS — E785 Hyperlipidemia, unspecified: Secondary | ICD-10-CM | POA: Diagnosis not present

## 2023-10-08 DIAGNOSIS — S72011D Unspecified intracapsular fracture of right femur, subsequent encounter for closed fracture with routine healing: Secondary | ICD-10-CM | POA: Diagnosis not present

## 2023-10-08 DIAGNOSIS — F32A Depression, unspecified: Secondary | ICD-10-CM | POA: Diagnosis not present

## 2023-10-08 DIAGNOSIS — Z85038 Personal history of other malignant neoplasm of large intestine: Secondary | ICD-10-CM | POA: Diagnosis not present

## 2023-10-09 DIAGNOSIS — Z85038 Personal history of other malignant neoplasm of large intestine: Secondary | ICD-10-CM | POA: Diagnosis not present

## 2023-10-09 DIAGNOSIS — Z87440 Personal history of urinary (tract) infections: Secondary | ICD-10-CM | POA: Diagnosis not present

## 2023-10-09 DIAGNOSIS — I10 Essential (primary) hypertension: Secondary | ICD-10-CM | POA: Diagnosis not present

## 2023-10-09 DIAGNOSIS — I700 Atherosclerosis of aorta: Secondary | ICD-10-CM | POA: Diagnosis not present

## 2023-10-09 DIAGNOSIS — F419 Anxiety disorder, unspecified: Secondary | ICD-10-CM | POA: Diagnosis not present

## 2023-10-09 DIAGNOSIS — E785 Hyperlipidemia, unspecified: Secondary | ICD-10-CM | POA: Diagnosis not present

## 2023-10-09 DIAGNOSIS — S72011D Unspecified intracapsular fracture of right femur, subsequent encounter for closed fracture with routine healing: Secondary | ICD-10-CM | POA: Diagnosis not present

## 2023-10-09 DIAGNOSIS — M47812 Spondylosis without myelopathy or radiculopathy, cervical region: Secondary | ICD-10-CM | POA: Diagnosis not present

## 2023-10-09 DIAGNOSIS — I2510 Atherosclerotic heart disease of native coronary artery without angina pectoris: Secondary | ICD-10-CM | POA: Diagnosis not present

## 2023-10-09 DIAGNOSIS — F32A Depression, unspecified: Secondary | ICD-10-CM | POA: Diagnosis not present

## 2023-10-09 DIAGNOSIS — M8580 Other specified disorders of bone density and structure, unspecified site: Secondary | ICD-10-CM | POA: Diagnosis not present

## 2023-10-09 DIAGNOSIS — K219 Gastro-esophageal reflux disease without esophagitis: Secondary | ICD-10-CM | POA: Diagnosis not present

## 2023-10-11 DIAGNOSIS — I10 Essential (primary) hypertension: Secondary | ICD-10-CM | POA: Diagnosis not present

## 2023-10-11 DIAGNOSIS — K219 Gastro-esophageal reflux disease without esophagitis: Secondary | ICD-10-CM | POA: Diagnosis not present

## 2023-10-11 DIAGNOSIS — F419 Anxiety disorder, unspecified: Secondary | ICD-10-CM | POA: Diagnosis not present

## 2023-10-11 DIAGNOSIS — Z85038 Personal history of other malignant neoplasm of large intestine: Secondary | ICD-10-CM | POA: Diagnosis not present

## 2023-10-11 DIAGNOSIS — S72041D Displaced fracture of base of neck of right femur, subsequent encounter for closed fracture with routine healing: Secondary | ICD-10-CM | POA: Diagnosis not present

## 2023-10-11 DIAGNOSIS — I700 Atherosclerosis of aorta: Secondary | ICD-10-CM | POA: Diagnosis not present

## 2023-10-11 DIAGNOSIS — E785 Hyperlipidemia, unspecified: Secondary | ICD-10-CM | POA: Diagnosis not present

## 2023-10-11 DIAGNOSIS — S72011D Unspecified intracapsular fracture of right femur, subsequent encounter for closed fracture with routine healing: Secondary | ICD-10-CM | POA: Diagnosis not present

## 2023-10-11 DIAGNOSIS — F32A Depression, unspecified: Secondary | ICD-10-CM | POA: Diagnosis not present

## 2023-10-11 DIAGNOSIS — Z87440 Personal history of urinary (tract) infections: Secondary | ICD-10-CM | POA: Diagnosis not present

## 2023-10-11 DIAGNOSIS — M8580 Other specified disorders of bone density and structure, unspecified site: Secondary | ICD-10-CM | POA: Diagnosis not present

## 2023-10-11 DIAGNOSIS — M47812 Spondylosis without myelopathy or radiculopathy, cervical region: Secondary | ICD-10-CM | POA: Diagnosis not present

## 2023-10-11 DIAGNOSIS — I2510 Atherosclerotic heart disease of native coronary artery without angina pectoris: Secondary | ICD-10-CM | POA: Diagnosis not present

## 2023-10-15 DIAGNOSIS — F419 Anxiety disorder, unspecified: Secondary | ICD-10-CM | POA: Diagnosis not present

## 2023-10-15 DIAGNOSIS — E785 Hyperlipidemia, unspecified: Secondary | ICD-10-CM | POA: Diagnosis not present

## 2023-10-15 DIAGNOSIS — I2510 Atherosclerotic heart disease of native coronary artery without angina pectoris: Secondary | ICD-10-CM | POA: Diagnosis not present

## 2023-10-15 DIAGNOSIS — Z85038 Personal history of other malignant neoplasm of large intestine: Secondary | ICD-10-CM | POA: Diagnosis not present

## 2023-10-15 DIAGNOSIS — S72011D Unspecified intracapsular fracture of right femur, subsequent encounter for closed fracture with routine healing: Secondary | ICD-10-CM | POA: Diagnosis not present

## 2023-10-15 DIAGNOSIS — Z87440 Personal history of urinary (tract) infections: Secondary | ICD-10-CM | POA: Diagnosis not present

## 2023-10-15 DIAGNOSIS — K219 Gastro-esophageal reflux disease without esophagitis: Secondary | ICD-10-CM | POA: Diagnosis not present

## 2023-10-15 DIAGNOSIS — M8580 Other specified disorders of bone density and structure, unspecified site: Secondary | ICD-10-CM | POA: Diagnosis not present

## 2023-10-15 DIAGNOSIS — F32A Depression, unspecified: Secondary | ICD-10-CM | POA: Diagnosis not present

## 2023-10-15 DIAGNOSIS — I10 Essential (primary) hypertension: Secondary | ICD-10-CM | POA: Diagnosis not present

## 2023-10-15 DIAGNOSIS — M47812 Spondylosis without myelopathy or radiculopathy, cervical region: Secondary | ICD-10-CM | POA: Diagnosis not present

## 2023-10-15 DIAGNOSIS — I700 Atherosclerosis of aorta: Secondary | ICD-10-CM | POA: Diagnosis not present

## 2023-10-17 DIAGNOSIS — E785 Hyperlipidemia, unspecified: Secondary | ICD-10-CM | POA: Diagnosis not present

## 2023-10-17 DIAGNOSIS — Z87440 Personal history of urinary (tract) infections: Secondary | ICD-10-CM | POA: Diagnosis not present

## 2023-10-17 DIAGNOSIS — K219 Gastro-esophageal reflux disease without esophagitis: Secondary | ICD-10-CM | POA: Diagnosis not present

## 2023-10-17 DIAGNOSIS — F419 Anxiety disorder, unspecified: Secondary | ICD-10-CM | POA: Diagnosis not present

## 2023-10-17 DIAGNOSIS — I700 Atherosclerosis of aorta: Secondary | ICD-10-CM | POA: Diagnosis not present

## 2023-10-17 DIAGNOSIS — M47812 Spondylosis without myelopathy or radiculopathy, cervical region: Secondary | ICD-10-CM | POA: Diagnosis not present

## 2023-10-17 DIAGNOSIS — I2510 Atherosclerotic heart disease of native coronary artery without angina pectoris: Secondary | ICD-10-CM | POA: Diagnosis not present

## 2023-10-17 DIAGNOSIS — Z85038 Personal history of other malignant neoplasm of large intestine: Secondary | ICD-10-CM | POA: Diagnosis not present

## 2023-10-17 DIAGNOSIS — S72011D Unspecified intracapsular fracture of right femur, subsequent encounter for closed fracture with routine healing: Secondary | ICD-10-CM | POA: Diagnosis not present

## 2023-10-17 DIAGNOSIS — M8580 Other specified disorders of bone density and structure, unspecified site: Secondary | ICD-10-CM | POA: Diagnosis not present

## 2023-10-17 DIAGNOSIS — F32A Depression, unspecified: Secondary | ICD-10-CM | POA: Diagnosis not present

## 2023-10-17 DIAGNOSIS — I10 Essential (primary) hypertension: Secondary | ICD-10-CM | POA: Diagnosis not present

## 2023-10-18 DIAGNOSIS — M8580 Other specified disorders of bone density and structure, unspecified site: Secondary | ICD-10-CM | POA: Diagnosis not present

## 2023-10-18 DIAGNOSIS — F32A Depression, unspecified: Secondary | ICD-10-CM | POA: Diagnosis not present

## 2023-10-18 DIAGNOSIS — S72011D Unspecified intracapsular fracture of right femur, subsequent encounter for closed fracture with routine healing: Secondary | ICD-10-CM | POA: Diagnosis not present

## 2023-10-18 DIAGNOSIS — I10 Essential (primary) hypertension: Secondary | ICD-10-CM | POA: Diagnosis not present

## 2023-10-18 DIAGNOSIS — E785 Hyperlipidemia, unspecified: Secondary | ICD-10-CM | POA: Diagnosis not present

## 2023-10-18 DIAGNOSIS — K219 Gastro-esophageal reflux disease without esophagitis: Secondary | ICD-10-CM | POA: Diagnosis not present

## 2023-10-18 DIAGNOSIS — I700 Atherosclerosis of aorta: Secondary | ICD-10-CM | POA: Diagnosis not present

## 2023-10-18 DIAGNOSIS — M47812 Spondylosis without myelopathy or radiculopathy, cervical region: Secondary | ICD-10-CM | POA: Diagnosis not present

## 2023-10-18 DIAGNOSIS — Z85038 Personal history of other malignant neoplasm of large intestine: Secondary | ICD-10-CM | POA: Diagnosis not present

## 2023-10-18 DIAGNOSIS — Z87440 Personal history of urinary (tract) infections: Secondary | ICD-10-CM | POA: Diagnosis not present

## 2023-10-18 DIAGNOSIS — F419 Anxiety disorder, unspecified: Secondary | ICD-10-CM | POA: Diagnosis not present

## 2023-10-18 DIAGNOSIS — I2510 Atherosclerotic heart disease of native coronary artery without angina pectoris: Secondary | ICD-10-CM | POA: Diagnosis not present

## 2023-10-21 DIAGNOSIS — F32A Depression, unspecified: Secondary | ICD-10-CM | POA: Diagnosis not present

## 2023-10-21 DIAGNOSIS — M8580 Other specified disorders of bone density and structure, unspecified site: Secondary | ICD-10-CM | POA: Diagnosis not present

## 2023-10-21 DIAGNOSIS — M47812 Spondylosis without myelopathy or radiculopathy, cervical region: Secondary | ICD-10-CM | POA: Diagnosis not present

## 2023-10-21 DIAGNOSIS — I700 Atherosclerosis of aorta: Secondary | ICD-10-CM | POA: Diagnosis not present

## 2023-10-21 DIAGNOSIS — I2510 Atherosclerotic heart disease of native coronary artery without angina pectoris: Secondary | ICD-10-CM | POA: Diagnosis not present

## 2023-10-21 DIAGNOSIS — E785 Hyperlipidemia, unspecified: Secondary | ICD-10-CM | POA: Diagnosis not present

## 2023-10-21 DIAGNOSIS — F419 Anxiety disorder, unspecified: Secondary | ICD-10-CM | POA: Diagnosis not present

## 2023-10-21 DIAGNOSIS — I10 Essential (primary) hypertension: Secondary | ICD-10-CM | POA: Diagnosis not present

## 2023-10-21 DIAGNOSIS — Z87440 Personal history of urinary (tract) infections: Secondary | ICD-10-CM | POA: Diagnosis not present

## 2023-10-21 DIAGNOSIS — K219 Gastro-esophageal reflux disease without esophagitis: Secondary | ICD-10-CM | POA: Diagnosis not present

## 2023-10-21 DIAGNOSIS — S72011D Unspecified intracapsular fracture of right femur, subsequent encounter for closed fracture with routine healing: Secondary | ICD-10-CM | POA: Diagnosis not present

## 2023-10-21 DIAGNOSIS — Z85038 Personal history of other malignant neoplasm of large intestine: Secondary | ICD-10-CM | POA: Diagnosis not present

## 2023-10-22 DIAGNOSIS — F419 Anxiety disorder, unspecified: Secondary | ICD-10-CM | POA: Diagnosis not present

## 2023-10-22 DIAGNOSIS — Z85038 Personal history of other malignant neoplasm of large intestine: Secondary | ICD-10-CM | POA: Diagnosis not present

## 2023-10-22 DIAGNOSIS — Z87440 Personal history of urinary (tract) infections: Secondary | ICD-10-CM | POA: Diagnosis not present

## 2023-10-22 DIAGNOSIS — S72011D Unspecified intracapsular fracture of right femur, subsequent encounter for closed fracture with routine healing: Secondary | ICD-10-CM | POA: Diagnosis not present

## 2023-10-22 DIAGNOSIS — E785 Hyperlipidemia, unspecified: Secondary | ICD-10-CM | POA: Diagnosis not present

## 2023-10-22 DIAGNOSIS — F32A Depression, unspecified: Secondary | ICD-10-CM | POA: Diagnosis not present

## 2023-10-22 DIAGNOSIS — K219 Gastro-esophageal reflux disease without esophagitis: Secondary | ICD-10-CM | POA: Diagnosis not present

## 2023-10-22 DIAGNOSIS — M8580 Other specified disorders of bone density and structure, unspecified site: Secondary | ICD-10-CM | POA: Diagnosis not present

## 2023-10-22 DIAGNOSIS — M47812 Spondylosis without myelopathy or radiculopathy, cervical region: Secondary | ICD-10-CM | POA: Diagnosis not present

## 2023-10-22 DIAGNOSIS — I10 Essential (primary) hypertension: Secondary | ICD-10-CM | POA: Diagnosis not present

## 2023-10-22 DIAGNOSIS — I700 Atherosclerosis of aorta: Secondary | ICD-10-CM | POA: Diagnosis not present

## 2023-10-22 DIAGNOSIS — I2510 Atherosclerotic heart disease of native coronary artery without angina pectoris: Secondary | ICD-10-CM | POA: Diagnosis not present

## 2023-10-29 DIAGNOSIS — S72011D Unspecified intracapsular fracture of right femur, subsequent encounter for closed fracture with routine healing: Secondary | ICD-10-CM | POA: Diagnosis not present

## 2023-10-29 DIAGNOSIS — F419 Anxiety disorder, unspecified: Secondary | ICD-10-CM | POA: Diagnosis not present

## 2023-10-29 DIAGNOSIS — Z85038 Personal history of other malignant neoplasm of large intestine: Secondary | ICD-10-CM | POA: Diagnosis not present

## 2023-10-29 DIAGNOSIS — F32A Depression, unspecified: Secondary | ICD-10-CM | POA: Diagnosis not present

## 2023-10-29 DIAGNOSIS — E785 Hyperlipidemia, unspecified: Secondary | ICD-10-CM | POA: Diagnosis not present

## 2023-10-29 DIAGNOSIS — Z87440 Personal history of urinary (tract) infections: Secondary | ICD-10-CM | POA: Diagnosis not present

## 2023-10-29 DIAGNOSIS — M8580 Other specified disorders of bone density and structure, unspecified site: Secondary | ICD-10-CM | POA: Diagnosis not present

## 2023-10-29 DIAGNOSIS — I700 Atherosclerosis of aorta: Secondary | ICD-10-CM | POA: Diagnosis not present

## 2023-10-29 DIAGNOSIS — K219 Gastro-esophageal reflux disease without esophagitis: Secondary | ICD-10-CM | POA: Diagnosis not present

## 2023-10-29 DIAGNOSIS — M47812 Spondylosis without myelopathy or radiculopathy, cervical region: Secondary | ICD-10-CM | POA: Diagnosis not present

## 2023-10-29 DIAGNOSIS — I10 Essential (primary) hypertension: Secondary | ICD-10-CM | POA: Diagnosis not present

## 2023-10-29 DIAGNOSIS — I2510 Atherosclerotic heart disease of native coronary artery without angina pectoris: Secondary | ICD-10-CM | POA: Diagnosis not present

## 2023-10-30 DIAGNOSIS — M47816 Spondylosis without myelopathy or radiculopathy, lumbar region: Secondary | ICD-10-CM | POA: Diagnosis not present

## 2023-10-30 DIAGNOSIS — Z79891 Long term (current) use of opiate analgesic: Secondary | ICD-10-CM | POA: Diagnosis not present

## 2023-11-01 DIAGNOSIS — I10 Essential (primary) hypertension: Secondary | ICD-10-CM | POA: Diagnosis not present

## 2023-11-01 DIAGNOSIS — E785 Hyperlipidemia, unspecified: Secondary | ICD-10-CM | POA: Diagnosis not present

## 2023-11-01 DIAGNOSIS — I700 Atherosclerosis of aorta: Secondary | ICD-10-CM | POA: Diagnosis not present

## 2023-11-01 DIAGNOSIS — Z85038 Personal history of other malignant neoplasm of large intestine: Secondary | ICD-10-CM | POA: Diagnosis not present

## 2023-11-01 DIAGNOSIS — M47812 Spondylosis without myelopathy or radiculopathy, cervical region: Secondary | ICD-10-CM | POA: Diagnosis not present

## 2023-11-01 DIAGNOSIS — M8580 Other specified disorders of bone density and structure, unspecified site: Secondary | ICD-10-CM | POA: Diagnosis not present

## 2023-11-01 DIAGNOSIS — S72041D Displaced fracture of base of neck of right femur, subsequent encounter for closed fracture with routine healing: Secondary | ICD-10-CM | POA: Diagnosis not present

## 2023-11-01 DIAGNOSIS — I2510 Atherosclerotic heart disease of native coronary artery without angina pectoris: Secondary | ICD-10-CM | POA: Diagnosis not present

## 2023-11-01 DIAGNOSIS — F32A Depression, unspecified: Secondary | ICD-10-CM | POA: Diagnosis not present

## 2023-11-01 DIAGNOSIS — K219 Gastro-esophageal reflux disease without esophagitis: Secondary | ICD-10-CM | POA: Diagnosis not present

## 2023-11-01 DIAGNOSIS — F419 Anxiety disorder, unspecified: Secondary | ICD-10-CM | POA: Diagnosis not present

## 2023-11-01 DIAGNOSIS — S72011D Unspecified intracapsular fracture of right femur, subsequent encounter for closed fracture with routine healing: Secondary | ICD-10-CM | POA: Diagnosis not present

## 2023-11-01 DIAGNOSIS — Z87440 Personal history of urinary (tract) infections: Secondary | ICD-10-CM | POA: Diagnosis not present

## 2023-11-05 DIAGNOSIS — Z85038 Personal history of other malignant neoplasm of large intestine: Secondary | ICD-10-CM | POA: Diagnosis not present

## 2023-11-05 DIAGNOSIS — M8580 Other specified disorders of bone density and structure, unspecified site: Secondary | ICD-10-CM | POA: Diagnosis not present

## 2023-11-05 DIAGNOSIS — S72011D Unspecified intracapsular fracture of right femur, subsequent encounter for closed fracture with routine healing: Secondary | ICD-10-CM | POA: Diagnosis not present

## 2023-11-05 DIAGNOSIS — Z87440 Personal history of urinary (tract) infections: Secondary | ICD-10-CM | POA: Diagnosis not present

## 2023-11-05 DIAGNOSIS — I10 Essential (primary) hypertension: Secondary | ICD-10-CM | POA: Diagnosis not present

## 2023-11-05 DIAGNOSIS — F419 Anxiety disorder, unspecified: Secondary | ICD-10-CM | POA: Diagnosis not present

## 2023-11-05 DIAGNOSIS — I700 Atherosclerosis of aorta: Secondary | ICD-10-CM | POA: Diagnosis not present

## 2023-11-05 DIAGNOSIS — M47812 Spondylosis without myelopathy or radiculopathy, cervical region: Secondary | ICD-10-CM | POA: Diagnosis not present

## 2023-11-05 DIAGNOSIS — K219 Gastro-esophageal reflux disease without esophagitis: Secondary | ICD-10-CM | POA: Diagnosis not present

## 2023-11-05 DIAGNOSIS — F32A Depression, unspecified: Secondary | ICD-10-CM | POA: Diagnosis not present

## 2023-11-05 DIAGNOSIS — I2510 Atherosclerotic heart disease of native coronary artery without angina pectoris: Secondary | ICD-10-CM | POA: Diagnosis not present

## 2023-11-05 DIAGNOSIS — E785 Hyperlipidemia, unspecified: Secondary | ICD-10-CM | POA: Diagnosis not present

## 2023-11-06 DIAGNOSIS — F32A Depression, unspecified: Secondary | ICD-10-CM | POA: Diagnosis not present

## 2023-11-06 DIAGNOSIS — E785 Hyperlipidemia, unspecified: Secondary | ICD-10-CM | POA: Diagnosis not present

## 2023-11-06 DIAGNOSIS — I700 Atherosclerosis of aorta: Secondary | ICD-10-CM | POA: Diagnosis not present

## 2023-11-06 DIAGNOSIS — F419 Anxiety disorder, unspecified: Secondary | ICD-10-CM | POA: Diagnosis not present

## 2023-11-06 DIAGNOSIS — Z87440 Personal history of urinary (tract) infections: Secondary | ICD-10-CM | POA: Diagnosis not present

## 2023-11-06 DIAGNOSIS — Z85038 Personal history of other malignant neoplasm of large intestine: Secondary | ICD-10-CM | POA: Diagnosis not present

## 2023-11-06 DIAGNOSIS — M8580 Other specified disorders of bone density and structure, unspecified site: Secondary | ICD-10-CM | POA: Diagnosis not present

## 2023-11-06 DIAGNOSIS — S72011D Unspecified intracapsular fracture of right femur, subsequent encounter for closed fracture with routine healing: Secondary | ICD-10-CM | POA: Diagnosis not present

## 2023-11-06 DIAGNOSIS — I10 Essential (primary) hypertension: Secondary | ICD-10-CM | POA: Diagnosis not present

## 2023-11-06 DIAGNOSIS — M47812 Spondylosis without myelopathy or radiculopathy, cervical region: Secondary | ICD-10-CM | POA: Diagnosis not present

## 2023-11-06 DIAGNOSIS — I2510 Atherosclerotic heart disease of native coronary artery without angina pectoris: Secondary | ICD-10-CM | POA: Diagnosis not present

## 2023-11-06 DIAGNOSIS — K219 Gastro-esophageal reflux disease without esophagitis: Secondary | ICD-10-CM | POA: Diagnosis not present

## 2023-11-11 DIAGNOSIS — S22080A Wedge compression fracture of T11-T12 vertebra, initial encounter for closed fracture: Secondary | ICD-10-CM | POA: Diagnosis not present

## 2023-11-13 DIAGNOSIS — F419 Anxiety disorder, unspecified: Secondary | ICD-10-CM | POA: Diagnosis not present

## 2023-11-13 DIAGNOSIS — I700 Atherosclerosis of aorta: Secondary | ICD-10-CM | POA: Diagnosis not present

## 2023-11-13 DIAGNOSIS — K219 Gastro-esophageal reflux disease without esophagitis: Secondary | ICD-10-CM | POA: Diagnosis not present

## 2023-11-13 DIAGNOSIS — I10 Essential (primary) hypertension: Secondary | ICD-10-CM | POA: Diagnosis not present

## 2023-11-13 DIAGNOSIS — E785 Hyperlipidemia, unspecified: Secondary | ICD-10-CM | POA: Diagnosis not present

## 2023-11-13 DIAGNOSIS — S72011D Unspecified intracapsular fracture of right femur, subsequent encounter for closed fracture with routine healing: Secondary | ICD-10-CM | POA: Diagnosis not present

## 2023-11-13 DIAGNOSIS — I2510 Atherosclerotic heart disease of native coronary artery without angina pectoris: Secondary | ICD-10-CM | POA: Diagnosis not present

## 2023-11-13 DIAGNOSIS — Z87440 Personal history of urinary (tract) infections: Secondary | ICD-10-CM | POA: Diagnosis not present

## 2023-11-13 DIAGNOSIS — M47812 Spondylosis without myelopathy or radiculopathy, cervical region: Secondary | ICD-10-CM | POA: Diagnosis not present

## 2023-11-13 DIAGNOSIS — Z85038 Personal history of other malignant neoplasm of large intestine: Secondary | ICD-10-CM | POA: Diagnosis not present

## 2023-11-13 DIAGNOSIS — F32A Depression, unspecified: Secondary | ICD-10-CM | POA: Diagnosis not present

## 2023-11-13 DIAGNOSIS — M8580 Other specified disorders of bone density and structure, unspecified site: Secondary | ICD-10-CM | POA: Diagnosis not present

## 2023-11-14 DIAGNOSIS — M47812 Spondylosis without myelopathy or radiculopathy, cervical region: Secondary | ICD-10-CM | POA: Diagnosis not present

## 2023-11-14 DIAGNOSIS — K219 Gastro-esophageal reflux disease without esophagitis: Secondary | ICD-10-CM | POA: Diagnosis not present

## 2023-11-14 DIAGNOSIS — F419 Anxiety disorder, unspecified: Secondary | ICD-10-CM | POA: Diagnosis not present

## 2023-11-14 DIAGNOSIS — Z85038 Personal history of other malignant neoplasm of large intestine: Secondary | ICD-10-CM | POA: Diagnosis not present

## 2023-11-14 DIAGNOSIS — Z87440 Personal history of urinary (tract) infections: Secondary | ICD-10-CM | POA: Diagnosis not present

## 2023-11-14 DIAGNOSIS — E785 Hyperlipidemia, unspecified: Secondary | ICD-10-CM | POA: Diagnosis not present

## 2023-11-14 DIAGNOSIS — I10 Essential (primary) hypertension: Secondary | ICD-10-CM | POA: Diagnosis not present

## 2023-11-14 DIAGNOSIS — M8580 Other specified disorders of bone density and structure, unspecified site: Secondary | ICD-10-CM | POA: Diagnosis not present

## 2023-11-14 DIAGNOSIS — I2510 Atherosclerotic heart disease of native coronary artery without angina pectoris: Secondary | ICD-10-CM | POA: Diagnosis not present

## 2023-11-14 DIAGNOSIS — S72011D Unspecified intracapsular fracture of right femur, subsequent encounter for closed fracture with routine healing: Secondary | ICD-10-CM | POA: Diagnosis not present

## 2023-11-14 DIAGNOSIS — F32A Depression, unspecified: Secondary | ICD-10-CM | POA: Diagnosis not present

## 2023-11-14 DIAGNOSIS — I700 Atherosclerosis of aorta: Secondary | ICD-10-CM | POA: Diagnosis not present

## 2023-11-18 DIAGNOSIS — M545 Low back pain, unspecified: Secondary | ICD-10-CM | POA: Diagnosis not present

## 2023-11-19 DIAGNOSIS — M4854XA Collapsed vertebra, not elsewhere classified, thoracic region, initial encounter for fracture: Secondary | ICD-10-CM | POA: Diagnosis not present

## 2023-11-19 DIAGNOSIS — M810 Age-related osteoporosis without current pathological fracture: Secondary | ICD-10-CM | POA: Diagnosis not present

## 2023-11-20 DIAGNOSIS — M8580 Other specified disorders of bone density and structure, unspecified site: Secondary | ICD-10-CM | POA: Diagnosis not present

## 2023-11-20 DIAGNOSIS — Z85038 Personal history of other malignant neoplasm of large intestine: Secondary | ICD-10-CM | POA: Diagnosis not present

## 2023-11-20 DIAGNOSIS — M47812 Spondylosis without myelopathy or radiculopathy, cervical region: Secondary | ICD-10-CM | POA: Diagnosis not present

## 2023-11-20 DIAGNOSIS — I10 Essential (primary) hypertension: Secondary | ICD-10-CM | POA: Diagnosis not present

## 2023-11-20 DIAGNOSIS — E785 Hyperlipidemia, unspecified: Secondary | ICD-10-CM | POA: Diagnosis not present

## 2023-11-20 DIAGNOSIS — I2510 Atherosclerotic heart disease of native coronary artery without angina pectoris: Secondary | ICD-10-CM | POA: Diagnosis not present

## 2023-11-20 DIAGNOSIS — I700 Atherosclerosis of aorta: Secondary | ICD-10-CM | POA: Diagnosis not present

## 2023-11-20 DIAGNOSIS — F419 Anxiety disorder, unspecified: Secondary | ICD-10-CM | POA: Diagnosis not present

## 2023-11-20 DIAGNOSIS — S72011D Unspecified intracapsular fracture of right femur, subsequent encounter for closed fracture with routine healing: Secondary | ICD-10-CM | POA: Diagnosis not present

## 2023-11-20 DIAGNOSIS — F32A Depression, unspecified: Secondary | ICD-10-CM | POA: Diagnosis not present

## 2023-11-20 DIAGNOSIS — Z87440 Personal history of urinary (tract) infections: Secondary | ICD-10-CM | POA: Diagnosis not present

## 2023-11-20 DIAGNOSIS — K219 Gastro-esophageal reflux disease without esophagitis: Secondary | ICD-10-CM | POA: Diagnosis not present

## 2023-11-22 DIAGNOSIS — I10 Essential (primary) hypertension: Secondary | ICD-10-CM | POA: Diagnosis not present

## 2023-11-22 DIAGNOSIS — K219 Gastro-esophageal reflux disease without esophagitis: Secondary | ICD-10-CM | POA: Diagnosis not present

## 2023-11-22 DIAGNOSIS — Z87440 Personal history of urinary (tract) infections: Secondary | ICD-10-CM | POA: Diagnosis not present

## 2023-11-22 DIAGNOSIS — S22080G Wedge compression fracture of T11-T12 vertebra, subsequent encounter for fracture with delayed healing: Secondary | ICD-10-CM | POA: Diagnosis not present

## 2023-11-22 DIAGNOSIS — M47812 Spondylosis without myelopathy or radiculopathy, cervical region: Secondary | ICD-10-CM | POA: Diagnosis not present

## 2023-11-22 DIAGNOSIS — I700 Atherosclerosis of aorta: Secondary | ICD-10-CM | POA: Diagnosis not present

## 2023-11-22 DIAGNOSIS — E785 Hyperlipidemia, unspecified: Secondary | ICD-10-CM | POA: Diagnosis not present

## 2023-11-22 DIAGNOSIS — Z85038 Personal history of other malignant neoplasm of large intestine: Secondary | ICD-10-CM | POA: Diagnosis not present

## 2023-11-22 DIAGNOSIS — M810 Age-related osteoporosis without current pathological fracture: Secondary | ICD-10-CM | POA: Diagnosis not present

## 2023-11-22 DIAGNOSIS — S72011D Unspecified intracapsular fracture of right femur, subsequent encounter for closed fracture with routine healing: Secondary | ICD-10-CM | POA: Diagnosis not present

## 2023-11-22 DIAGNOSIS — M8580 Other specified disorders of bone density and structure, unspecified site: Secondary | ICD-10-CM | POA: Diagnosis not present

## 2023-11-22 DIAGNOSIS — F32A Depression, unspecified: Secondary | ICD-10-CM | POA: Diagnosis not present

## 2023-11-22 DIAGNOSIS — F419 Anxiety disorder, unspecified: Secondary | ICD-10-CM | POA: Diagnosis not present

## 2023-11-22 DIAGNOSIS — I2510 Atherosclerotic heart disease of native coronary artery without angina pectoris: Secondary | ICD-10-CM | POA: Diagnosis not present

## 2023-11-26 DIAGNOSIS — K219 Gastro-esophageal reflux disease without esophagitis: Secondary | ICD-10-CM | POA: Diagnosis not present

## 2023-11-26 DIAGNOSIS — M47812 Spondylosis without myelopathy or radiculopathy, cervical region: Secondary | ICD-10-CM | POA: Diagnosis not present

## 2023-11-26 DIAGNOSIS — I2510 Atherosclerotic heart disease of native coronary artery without angina pectoris: Secondary | ICD-10-CM | POA: Diagnosis not present

## 2023-11-26 DIAGNOSIS — I700 Atherosclerosis of aorta: Secondary | ICD-10-CM | POA: Diagnosis not present

## 2023-11-26 DIAGNOSIS — F419 Anxiety disorder, unspecified: Secondary | ICD-10-CM | POA: Diagnosis not present

## 2023-11-26 DIAGNOSIS — M8580 Other specified disorders of bone density and structure, unspecified site: Secondary | ICD-10-CM | POA: Diagnosis not present

## 2023-11-26 DIAGNOSIS — E785 Hyperlipidemia, unspecified: Secondary | ICD-10-CM | POA: Diagnosis not present

## 2023-11-26 DIAGNOSIS — F32A Depression, unspecified: Secondary | ICD-10-CM | POA: Diagnosis not present

## 2023-11-26 DIAGNOSIS — Z87440 Personal history of urinary (tract) infections: Secondary | ICD-10-CM | POA: Diagnosis not present

## 2023-11-26 DIAGNOSIS — I10 Essential (primary) hypertension: Secondary | ICD-10-CM | POA: Diagnosis not present

## 2023-11-26 DIAGNOSIS — Z85038 Personal history of other malignant neoplasm of large intestine: Secondary | ICD-10-CM | POA: Diagnosis not present

## 2023-11-26 DIAGNOSIS — S72011D Unspecified intracapsular fracture of right femur, subsequent encounter for closed fracture with routine healing: Secondary | ICD-10-CM | POA: Diagnosis not present

## 2023-11-26 DIAGNOSIS — Z9181 History of falling: Secondary | ICD-10-CM | POA: Diagnosis not present

## 2023-12-02 DIAGNOSIS — M545 Low back pain, unspecified: Secondary | ICD-10-CM | POA: Diagnosis not present

## 2023-12-05 DIAGNOSIS — S22080D Wedge compression fracture of T11-T12 vertebra, subsequent encounter for fracture with routine healing: Secondary | ICD-10-CM | POA: Diagnosis not present

## 2023-12-11 DIAGNOSIS — F419 Anxiety disorder, unspecified: Secondary | ICD-10-CM | POA: Diagnosis not present

## 2023-12-11 DIAGNOSIS — I700 Atherosclerosis of aorta: Secondary | ICD-10-CM | POA: Diagnosis not present

## 2023-12-11 DIAGNOSIS — Z87440 Personal history of urinary (tract) infections: Secondary | ICD-10-CM | POA: Diagnosis not present

## 2023-12-11 DIAGNOSIS — M8580 Other specified disorders of bone density and structure, unspecified site: Secondary | ICD-10-CM | POA: Diagnosis not present

## 2023-12-11 DIAGNOSIS — Z85038 Personal history of other malignant neoplasm of large intestine: Secondary | ICD-10-CM | POA: Diagnosis not present

## 2023-12-11 DIAGNOSIS — I2510 Atherosclerotic heart disease of native coronary artery without angina pectoris: Secondary | ICD-10-CM | POA: Diagnosis not present

## 2023-12-11 DIAGNOSIS — I10 Essential (primary) hypertension: Secondary | ICD-10-CM | POA: Diagnosis not present

## 2023-12-11 DIAGNOSIS — S72011D Unspecified intracapsular fracture of right femur, subsequent encounter for closed fracture with routine healing: Secondary | ICD-10-CM | POA: Diagnosis not present

## 2023-12-11 DIAGNOSIS — E785 Hyperlipidemia, unspecified: Secondary | ICD-10-CM | POA: Diagnosis not present

## 2023-12-11 DIAGNOSIS — K219 Gastro-esophageal reflux disease without esophagitis: Secondary | ICD-10-CM | POA: Diagnosis not present

## 2023-12-11 DIAGNOSIS — F32A Depression, unspecified: Secondary | ICD-10-CM | POA: Diagnosis not present

## 2023-12-11 DIAGNOSIS — M47812 Spondylosis without myelopathy or radiculopathy, cervical region: Secondary | ICD-10-CM | POA: Diagnosis not present

## 2023-12-11 DIAGNOSIS — Z9181 History of falling: Secondary | ICD-10-CM | POA: Diagnosis not present

## 2023-12-13 DIAGNOSIS — I2510 Atherosclerotic heart disease of native coronary artery without angina pectoris: Secondary | ICD-10-CM | POA: Diagnosis not present

## 2023-12-13 DIAGNOSIS — F419 Anxiety disorder, unspecified: Secondary | ICD-10-CM | POA: Diagnosis not present

## 2023-12-13 DIAGNOSIS — M8580 Other specified disorders of bone density and structure, unspecified site: Secondary | ICD-10-CM | POA: Diagnosis not present

## 2023-12-13 DIAGNOSIS — S72011D Unspecified intracapsular fracture of right femur, subsequent encounter for closed fracture with routine healing: Secondary | ICD-10-CM | POA: Diagnosis not present

## 2023-12-13 DIAGNOSIS — M47812 Spondylosis without myelopathy or radiculopathy, cervical region: Secondary | ICD-10-CM | POA: Diagnosis not present

## 2023-12-13 DIAGNOSIS — Z87440 Personal history of urinary (tract) infections: Secondary | ICD-10-CM | POA: Diagnosis not present

## 2023-12-13 DIAGNOSIS — I10 Essential (primary) hypertension: Secondary | ICD-10-CM | POA: Diagnosis not present

## 2023-12-13 DIAGNOSIS — I700 Atherosclerosis of aorta: Secondary | ICD-10-CM | POA: Diagnosis not present

## 2023-12-13 DIAGNOSIS — F32A Depression, unspecified: Secondary | ICD-10-CM | POA: Diagnosis not present

## 2023-12-13 DIAGNOSIS — E785 Hyperlipidemia, unspecified: Secondary | ICD-10-CM | POA: Diagnosis not present

## 2023-12-13 DIAGNOSIS — Z9181 History of falling: Secondary | ICD-10-CM | POA: Diagnosis not present

## 2023-12-13 DIAGNOSIS — K219 Gastro-esophageal reflux disease without esophagitis: Secondary | ICD-10-CM | POA: Diagnosis not present

## 2023-12-13 DIAGNOSIS — Z85038 Personal history of other malignant neoplasm of large intestine: Secondary | ICD-10-CM | POA: Diagnosis not present

## 2023-12-17 DIAGNOSIS — M47812 Spondylosis without myelopathy or radiculopathy, cervical region: Secondary | ICD-10-CM | POA: Diagnosis not present

## 2023-12-17 DIAGNOSIS — F419 Anxiety disorder, unspecified: Secondary | ICD-10-CM | POA: Diagnosis not present

## 2023-12-17 DIAGNOSIS — E785 Hyperlipidemia, unspecified: Secondary | ICD-10-CM | POA: Diagnosis not present

## 2023-12-17 DIAGNOSIS — Z85038 Personal history of other malignant neoplasm of large intestine: Secondary | ICD-10-CM | POA: Diagnosis not present

## 2023-12-17 DIAGNOSIS — F32A Depression, unspecified: Secondary | ICD-10-CM | POA: Diagnosis not present

## 2023-12-17 DIAGNOSIS — I2510 Atherosclerotic heart disease of native coronary artery without angina pectoris: Secondary | ICD-10-CM | POA: Diagnosis not present

## 2023-12-17 DIAGNOSIS — I700 Atherosclerosis of aorta: Secondary | ICD-10-CM | POA: Diagnosis not present

## 2023-12-17 DIAGNOSIS — I10 Essential (primary) hypertension: Secondary | ICD-10-CM | POA: Diagnosis not present

## 2023-12-17 DIAGNOSIS — M8580 Other specified disorders of bone density and structure, unspecified site: Secondary | ICD-10-CM | POA: Diagnosis not present

## 2023-12-17 DIAGNOSIS — K219 Gastro-esophageal reflux disease without esophagitis: Secondary | ICD-10-CM | POA: Diagnosis not present

## 2023-12-17 DIAGNOSIS — S72011D Unspecified intracapsular fracture of right femur, subsequent encounter for closed fracture with routine healing: Secondary | ICD-10-CM | POA: Diagnosis not present

## 2023-12-17 DIAGNOSIS — Z87440 Personal history of urinary (tract) infections: Secondary | ICD-10-CM | POA: Diagnosis not present

## 2023-12-17 DIAGNOSIS — Z9181 History of falling: Secondary | ICD-10-CM | POA: Diagnosis not present

## 2023-12-19 DIAGNOSIS — I2510 Atherosclerotic heart disease of native coronary artery without angina pectoris: Secondary | ICD-10-CM | POA: Diagnosis not present

## 2023-12-19 DIAGNOSIS — Z85038 Personal history of other malignant neoplasm of large intestine: Secondary | ICD-10-CM | POA: Diagnosis not present

## 2023-12-19 DIAGNOSIS — E785 Hyperlipidemia, unspecified: Secondary | ICD-10-CM | POA: Diagnosis not present

## 2023-12-19 DIAGNOSIS — K219 Gastro-esophageal reflux disease without esophagitis: Secondary | ICD-10-CM | POA: Diagnosis not present

## 2023-12-19 DIAGNOSIS — Z87440 Personal history of urinary (tract) infections: Secondary | ICD-10-CM | POA: Diagnosis not present

## 2023-12-19 DIAGNOSIS — M8580 Other specified disorders of bone density and structure, unspecified site: Secondary | ICD-10-CM | POA: Diagnosis not present

## 2023-12-19 DIAGNOSIS — F32A Depression, unspecified: Secondary | ICD-10-CM | POA: Diagnosis not present

## 2023-12-19 DIAGNOSIS — F419 Anxiety disorder, unspecified: Secondary | ICD-10-CM | POA: Diagnosis not present

## 2023-12-19 DIAGNOSIS — S72011D Unspecified intracapsular fracture of right femur, subsequent encounter for closed fracture with routine healing: Secondary | ICD-10-CM | POA: Diagnosis not present

## 2023-12-19 DIAGNOSIS — I10 Essential (primary) hypertension: Secondary | ICD-10-CM | POA: Diagnosis not present

## 2023-12-19 DIAGNOSIS — I700 Atherosclerosis of aorta: Secondary | ICD-10-CM | POA: Diagnosis not present

## 2023-12-19 DIAGNOSIS — M47812 Spondylosis without myelopathy or radiculopathy, cervical region: Secondary | ICD-10-CM | POA: Diagnosis not present

## 2023-12-19 DIAGNOSIS — Z9181 History of falling: Secondary | ICD-10-CM | POA: Diagnosis not present

## 2023-12-24 DIAGNOSIS — I2510 Atherosclerotic heart disease of native coronary artery without angina pectoris: Secondary | ICD-10-CM | POA: Diagnosis not present

## 2023-12-24 DIAGNOSIS — F419 Anxiety disorder, unspecified: Secondary | ICD-10-CM | POA: Diagnosis not present

## 2023-12-24 DIAGNOSIS — Z87440 Personal history of urinary (tract) infections: Secondary | ICD-10-CM | POA: Diagnosis not present

## 2023-12-24 DIAGNOSIS — M8580 Other specified disorders of bone density and structure, unspecified site: Secondary | ICD-10-CM | POA: Diagnosis not present

## 2023-12-24 DIAGNOSIS — E785 Hyperlipidemia, unspecified: Secondary | ICD-10-CM | POA: Diagnosis not present

## 2023-12-24 DIAGNOSIS — F32A Depression, unspecified: Secondary | ICD-10-CM | POA: Diagnosis not present

## 2023-12-24 DIAGNOSIS — M47812 Spondylosis without myelopathy or radiculopathy, cervical region: Secondary | ICD-10-CM | POA: Diagnosis not present

## 2023-12-24 DIAGNOSIS — K219 Gastro-esophageal reflux disease without esophagitis: Secondary | ICD-10-CM | POA: Diagnosis not present

## 2023-12-24 DIAGNOSIS — I700 Atherosclerosis of aorta: Secondary | ICD-10-CM | POA: Diagnosis not present

## 2023-12-24 DIAGNOSIS — S72011D Unspecified intracapsular fracture of right femur, subsequent encounter for closed fracture with routine healing: Secondary | ICD-10-CM | POA: Diagnosis not present

## 2023-12-24 DIAGNOSIS — I10 Essential (primary) hypertension: Secondary | ICD-10-CM | POA: Diagnosis not present

## 2023-12-24 DIAGNOSIS — Z85038 Personal history of other malignant neoplasm of large intestine: Secondary | ICD-10-CM | POA: Diagnosis not present

## 2023-12-24 DIAGNOSIS — Z9181 History of falling: Secondary | ICD-10-CM | POA: Diagnosis not present

## 2023-12-25 ENCOUNTER — Other Ambulatory Visit: Payer: Self-pay | Admitting: Family Medicine

## 2023-12-25 DIAGNOSIS — K219 Gastro-esophageal reflux disease without esophagitis: Secondary | ICD-10-CM | POA: Diagnosis not present

## 2023-12-25 DIAGNOSIS — E785 Hyperlipidemia, unspecified: Secondary | ICD-10-CM | POA: Diagnosis not present

## 2023-12-25 DIAGNOSIS — Z85038 Personal history of other malignant neoplasm of large intestine: Secondary | ICD-10-CM | POA: Diagnosis not present

## 2023-12-25 DIAGNOSIS — Z87440 Personal history of urinary (tract) infections: Secondary | ICD-10-CM | POA: Diagnosis not present

## 2023-12-25 DIAGNOSIS — M8580 Other specified disorders of bone density and structure, unspecified site: Secondary | ICD-10-CM | POA: Diagnosis not present

## 2023-12-25 DIAGNOSIS — S72011D Unspecified intracapsular fracture of right femur, subsequent encounter for closed fracture with routine healing: Secondary | ICD-10-CM | POA: Diagnosis not present

## 2023-12-25 DIAGNOSIS — I700 Atherosclerosis of aorta: Secondary | ICD-10-CM | POA: Diagnosis not present

## 2023-12-25 DIAGNOSIS — I2510 Atherosclerotic heart disease of native coronary artery without angina pectoris: Secondary | ICD-10-CM | POA: Diagnosis not present

## 2023-12-25 DIAGNOSIS — F32A Depression, unspecified: Secondary | ICD-10-CM | POA: Diagnosis not present

## 2023-12-25 DIAGNOSIS — R911 Solitary pulmonary nodule: Secondary | ICD-10-CM

## 2023-12-25 DIAGNOSIS — Z9181 History of falling: Secondary | ICD-10-CM | POA: Diagnosis not present

## 2023-12-25 DIAGNOSIS — F419 Anxiety disorder, unspecified: Secondary | ICD-10-CM | POA: Diagnosis not present

## 2023-12-25 DIAGNOSIS — M47812 Spondylosis without myelopathy or radiculopathy, cervical region: Secondary | ICD-10-CM | POA: Diagnosis not present

## 2023-12-25 DIAGNOSIS — I10 Essential (primary) hypertension: Secondary | ICD-10-CM | POA: Diagnosis not present

## 2024-01-01 DIAGNOSIS — S72011D Unspecified intracapsular fracture of right femur, subsequent encounter for closed fracture with routine healing: Secondary | ICD-10-CM | POA: Diagnosis not present

## 2024-01-01 DIAGNOSIS — K219 Gastro-esophageal reflux disease without esophagitis: Secondary | ICD-10-CM | POA: Diagnosis not present

## 2024-01-01 DIAGNOSIS — F32A Depression, unspecified: Secondary | ICD-10-CM | POA: Diagnosis not present

## 2024-01-01 DIAGNOSIS — Z9181 History of falling: Secondary | ICD-10-CM | POA: Diagnosis not present

## 2024-01-01 DIAGNOSIS — Z87440 Personal history of urinary (tract) infections: Secondary | ICD-10-CM | POA: Diagnosis not present

## 2024-01-01 DIAGNOSIS — I700 Atherosclerosis of aorta: Secondary | ICD-10-CM | POA: Diagnosis not present

## 2024-01-01 DIAGNOSIS — F419 Anxiety disorder, unspecified: Secondary | ICD-10-CM | POA: Diagnosis not present

## 2024-01-01 DIAGNOSIS — I2510 Atherosclerotic heart disease of native coronary artery without angina pectoris: Secondary | ICD-10-CM | POA: Diagnosis not present

## 2024-01-01 DIAGNOSIS — E785 Hyperlipidemia, unspecified: Secondary | ICD-10-CM | POA: Diagnosis not present

## 2024-01-01 DIAGNOSIS — I10 Essential (primary) hypertension: Secondary | ICD-10-CM | POA: Diagnosis not present

## 2024-01-01 DIAGNOSIS — M8580 Other specified disorders of bone density and structure, unspecified site: Secondary | ICD-10-CM | POA: Diagnosis not present

## 2024-01-01 DIAGNOSIS — M47812 Spondylosis without myelopathy or radiculopathy, cervical region: Secondary | ICD-10-CM | POA: Diagnosis not present

## 2024-01-01 DIAGNOSIS — Z85038 Personal history of other malignant neoplasm of large intestine: Secondary | ICD-10-CM | POA: Diagnosis not present

## 2024-01-06 DIAGNOSIS — F32A Depression, unspecified: Secondary | ICD-10-CM | POA: Diagnosis not present

## 2024-01-06 DIAGNOSIS — Z85038 Personal history of other malignant neoplasm of large intestine: Secondary | ICD-10-CM | POA: Diagnosis not present

## 2024-01-06 DIAGNOSIS — Z9181 History of falling: Secondary | ICD-10-CM | POA: Diagnosis not present

## 2024-01-06 DIAGNOSIS — K219 Gastro-esophageal reflux disease without esophagitis: Secondary | ICD-10-CM | POA: Diagnosis not present

## 2024-01-06 DIAGNOSIS — S72011D Unspecified intracapsular fracture of right femur, subsequent encounter for closed fracture with routine healing: Secondary | ICD-10-CM | POA: Diagnosis not present

## 2024-01-06 DIAGNOSIS — Z87440 Personal history of urinary (tract) infections: Secondary | ICD-10-CM | POA: Diagnosis not present

## 2024-01-06 DIAGNOSIS — F419 Anxiety disorder, unspecified: Secondary | ICD-10-CM | POA: Diagnosis not present

## 2024-01-06 DIAGNOSIS — I10 Essential (primary) hypertension: Secondary | ICD-10-CM | POA: Diagnosis not present

## 2024-01-06 DIAGNOSIS — I700 Atherosclerosis of aorta: Secondary | ICD-10-CM | POA: Diagnosis not present

## 2024-01-06 DIAGNOSIS — E785 Hyperlipidemia, unspecified: Secondary | ICD-10-CM | POA: Diagnosis not present

## 2024-01-06 DIAGNOSIS — I2510 Atherosclerotic heart disease of native coronary artery without angina pectoris: Secondary | ICD-10-CM | POA: Diagnosis not present

## 2024-01-06 DIAGNOSIS — M8580 Other specified disorders of bone density and structure, unspecified site: Secondary | ICD-10-CM | POA: Diagnosis not present

## 2024-01-06 DIAGNOSIS — M47812 Spondylosis without myelopathy or radiculopathy, cervical region: Secondary | ICD-10-CM | POA: Diagnosis not present

## 2024-01-07 DIAGNOSIS — F32A Depression, unspecified: Secondary | ICD-10-CM | POA: Diagnosis not present

## 2024-01-07 DIAGNOSIS — I2510 Atherosclerotic heart disease of native coronary artery without angina pectoris: Secondary | ICD-10-CM | POA: Diagnosis not present

## 2024-01-07 DIAGNOSIS — I10 Essential (primary) hypertension: Secondary | ICD-10-CM | POA: Diagnosis not present

## 2024-01-07 DIAGNOSIS — Z85038 Personal history of other malignant neoplasm of large intestine: Secondary | ICD-10-CM | POA: Diagnosis not present

## 2024-01-07 DIAGNOSIS — Z9181 History of falling: Secondary | ICD-10-CM | POA: Diagnosis not present

## 2024-01-07 DIAGNOSIS — Z87440 Personal history of urinary (tract) infections: Secondary | ICD-10-CM | POA: Diagnosis not present

## 2024-01-07 DIAGNOSIS — E785 Hyperlipidemia, unspecified: Secondary | ICD-10-CM | POA: Diagnosis not present

## 2024-01-07 DIAGNOSIS — I700 Atherosclerosis of aorta: Secondary | ICD-10-CM | POA: Diagnosis not present

## 2024-01-07 DIAGNOSIS — M8580 Other specified disorders of bone density and structure, unspecified site: Secondary | ICD-10-CM | POA: Diagnosis not present

## 2024-01-07 DIAGNOSIS — M47812 Spondylosis without myelopathy or radiculopathy, cervical region: Secondary | ICD-10-CM | POA: Diagnosis not present

## 2024-01-07 DIAGNOSIS — F419 Anxiety disorder, unspecified: Secondary | ICD-10-CM | POA: Diagnosis not present

## 2024-01-07 DIAGNOSIS — K219 Gastro-esophageal reflux disease without esophagitis: Secondary | ICD-10-CM | POA: Diagnosis not present

## 2024-01-07 DIAGNOSIS — S72011D Unspecified intracapsular fracture of right femur, subsequent encounter for closed fracture with routine healing: Secondary | ICD-10-CM | POA: Diagnosis not present

## 2024-01-17 ENCOUNTER — Ambulatory Visit
Admission: RE | Admit: 2024-01-17 | Discharge: 2024-01-17 | Disposition: A | Discharge status: Home / Self Care | Source: Ambulatory Visit | Attending: Family Medicine | Admitting: Family Medicine

## 2024-01-17 DIAGNOSIS — F32A Depression, unspecified: Secondary | ICD-10-CM | POA: Diagnosis not present

## 2024-01-17 DIAGNOSIS — R911 Solitary pulmonary nodule: Secondary | ICD-10-CM

## 2024-01-17 DIAGNOSIS — M47812 Spondylosis without myelopathy or radiculopathy, cervical region: Secondary | ICD-10-CM | POA: Diagnosis not present

## 2024-01-17 DIAGNOSIS — I700 Atherosclerosis of aorta: Secondary | ICD-10-CM | POA: Diagnosis not present

## 2024-01-17 DIAGNOSIS — I2510 Atherosclerotic heart disease of native coronary artery without angina pectoris: Secondary | ICD-10-CM | POA: Diagnosis not present

## 2024-01-17 DIAGNOSIS — Z85038 Personal history of other malignant neoplasm of large intestine: Secondary | ICD-10-CM | POA: Diagnosis not present

## 2024-01-17 DIAGNOSIS — E785 Hyperlipidemia, unspecified: Secondary | ICD-10-CM | POA: Diagnosis not present

## 2024-01-17 DIAGNOSIS — I10 Essential (primary) hypertension: Secondary | ICD-10-CM | POA: Diagnosis not present

## 2024-01-17 DIAGNOSIS — M8580 Other specified disorders of bone density and structure, unspecified site: Secondary | ICD-10-CM | POA: Diagnosis not present

## 2024-01-17 DIAGNOSIS — K219 Gastro-esophageal reflux disease without esophagitis: Secondary | ICD-10-CM | POA: Diagnosis not present

## 2024-01-17 DIAGNOSIS — F419 Anxiety disorder, unspecified: Secondary | ICD-10-CM | POA: Diagnosis not present

## 2024-01-17 DIAGNOSIS — Z9181 History of falling: Secondary | ICD-10-CM | POA: Diagnosis not present

## 2024-01-17 DIAGNOSIS — S72011D Unspecified intracapsular fracture of right femur, subsequent encounter for closed fracture with routine healing: Secondary | ICD-10-CM | POA: Diagnosis not present

## 2024-01-17 DIAGNOSIS — Z87440 Personal history of urinary (tract) infections: Secondary | ICD-10-CM | POA: Diagnosis not present

## 2024-01-18 DIAGNOSIS — K219 Gastro-esophageal reflux disease without esophagitis: Secondary | ICD-10-CM | POA: Diagnosis not present

## 2024-01-18 DIAGNOSIS — Z87440 Personal history of urinary (tract) infections: Secondary | ICD-10-CM | POA: Diagnosis not present

## 2024-01-18 DIAGNOSIS — E785 Hyperlipidemia, unspecified: Secondary | ICD-10-CM | POA: Diagnosis not present

## 2024-01-18 DIAGNOSIS — F32A Depression, unspecified: Secondary | ICD-10-CM | POA: Diagnosis not present

## 2024-01-18 DIAGNOSIS — Z85038 Personal history of other malignant neoplasm of large intestine: Secondary | ICD-10-CM | POA: Diagnosis not present

## 2024-01-18 DIAGNOSIS — M8580 Other specified disorders of bone density and structure, unspecified site: Secondary | ICD-10-CM | POA: Diagnosis not present

## 2024-01-18 DIAGNOSIS — I700 Atherosclerosis of aorta: Secondary | ICD-10-CM | POA: Diagnosis not present

## 2024-01-18 DIAGNOSIS — I10 Essential (primary) hypertension: Secondary | ICD-10-CM | POA: Diagnosis not present

## 2024-01-18 DIAGNOSIS — S72011D Unspecified intracapsular fracture of right femur, subsequent encounter for closed fracture with routine healing: Secondary | ICD-10-CM | POA: Diagnosis not present

## 2024-01-18 DIAGNOSIS — I2510 Atherosclerotic heart disease of native coronary artery without angina pectoris: Secondary | ICD-10-CM | POA: Diagnosis not present

## 2024-01-18 DIAGNOSIS — M47812 Spondylosis without myelopathy or radiculopathy, cervical region: Secondary | ICD-10-CM | POA: Diagnosis not present

## 2024-01-18 DIAGNOSIS — F419 Anxiety disorder, unspecified: Secondary | ICD-10-CM | POA: Diagnosis not present

## 2024-01-18 DIAGNOSIS — Z9181 History of falling: Secondary | ICD-10-CM | POA: Diagnosis not present

## 2024-01-21 DIAGNOSIS — K219 Gastro-esophageal reflux disease without esophagitis: Secondary | ICD-10-CM | POA: Diagnosis not present

## 2024-01-21 DIAGNOSIS — M810 Age-related osteoporosis without current pathological fracture: Secondary | ICD-10-CM | POA: Diagnosis not present

## 2024-01-21 DIAGNOSIS — E7800 Pure hypercholesterolemia, unspecified: Secondary | ICD-10-CM | POA: Diagnosis not present

## 2024-01-23 DIAGNOSIS — F322 Major depressive disorder, single episode, severe without psychotic features: Secondary | ICD-10-CM | POA: Diagnosis not present

## 2024-01-23 DIAGNOSIS — M810 Age-related osteoporosis without current pathological fracture: Secondary | ICD-10-CM | POA: Diagnosis not present

## 2024-01-23 DIAGNOSIS — I351 Nonrheumatic aortic (valve) insufficiency: Secondary | ICD-10-CM | POA: Diagnosis not present

## 2024-01-23 DIAGNOSIS — Z0000 Encounter for general adult medical examination without abnormal findings: Secondary | ICD-10-CM | POA: Diagnosis not present

## 2024-01-23 DIAGNOSIS — K219 Gastro-esophageal reflux disease without esophagitis: Secondary | ICD-10-CM | POA: Diagnosis not present

## 2024-02-13 DIAGNOSIS — M47816 Spondylosis without myelopathy or radiculopathy, lumbar region: Secondary | ICD-10-CM | POA: Diagnosis not present

## 2024-02-13 DIAGNOSIS — M4807 Spinal stenosis, lumbosacral region: Secondary | ICD-10-CM | POA: Diagnosis not present

## 2024-02-20 DIAGNOSIS — M5416 Radiculopathy, lumbar region: Secondary | ICD-10-CM | POA: Diagnosis not present

## 2024-02-23 ENCOUNTER — Emergency Department (HOSPITAL_COMMUNITY)

## 2024-02-23 ENCOUNTER — Encounter (HOSPITAL_COMMUNITY): Payer: Self-pay

## 2024-02-23 ENCOUNTER — Inpatient Hospital Stay (HOSPITAL_COMMUNITY)
Admission: EM | Admit: 2024-02-23 | Discharge: 2024-02-27 | DRG: 482 | Disposition: A | Discharge status: Skilled Nursing Facility | Attending: Internal Medicine | Admitting: Internal Medicine

## 2024-02-23 DIAGNOSIS — Z85828 Personal history of other malignant neoplasm of skin: Secondary | ICD-10-CM

## 2024-02-23 DIAGNOSIS — Z85038 Personal history of other malignant neoplasm of large intestine: Secondary | ICD-10-CM

## 2024-02-23 DIAGNOSIS — C180 Malignant neoplasm of cecum: Secondary | ICD-10-CM | POA: Diagnosis not present

## 2024-02-23 DIAGNOSIS — S42032A Displaced fracture of lateral end of left clavicle, initial encounter for closed fracture: Secondary | ICD-10-CM

## 2024-02-23 DIAGNOSIS — R262 Difficulty in walking, not elsewhere classified: Secondary | ICD-10-CM | POA: Diagnosis present

## 2024-02-23 DIAGNOSIS — R9082 White matter disease, unspecified: Secondary | ICD-10-CM | POA: Diagnosis not present

## 2024-02-23 DIAGNOSIS — S72142A Displaced intertrochanteric fracture of left femur, initial encounter for closed fracture: Secondary | ICD-10-CM

## 2024-02-23 DIAGNOSIS — Z9842 Cataract extraction status, left eye: Secondary | ICD-10-CM | POA: Diagnosis not present

## 2024-02-23 DIAGNOSIS — Z9841 Cataract extraction status, right eye: Secondary | ICD-10-CM

## 2024-02-23 DIAGNOSIS — Z09 Encounter for follow-up examination after completed treatment for conditions other than malignant neoplasm: Secondary | ICD-10-CM | POA: Diagnosis not present

## 2024-02-23 DIAGNOSIS — I10 Essential (primary) hypertension: Secondary | ICD-10-CM | POA: Diagnosis not present

## 2024-02-23 DIAGNOSIS — S72009A Fracture of unspecified part of neck of unspecified femur, initial encounter for closed fracture: Secondary | ICD-10-CM | POA: Diagnosis present

## 2024-02-23 DIAGNOSIS — D72829 Elevated white blood cell count, unspecified: Secondary | ICD-10-CM | POA: Diagnosis not present

## 2024-02-23 DIAGNOSIS — W19XXXA Unspecified fall, initial encounter: Principal | ICD-10-CM

## 2024-02-23 DIAGNOSIS — S42035A Nondisplaced fracture of lateral end of left clavicle, initial encounter for closed fracture: Secondary | ICD-10-CM | POA: Diagnosis not present

## 2024-02-23 DIAGNOSIS — Z96641 Presence of right artificial hip joint: Secondary | ICD-10-CM | POA: Diagnosis present

## 2024-02-23 DIAGNOSIS — R030 Elevated blood-pressure reading, without diagnosis of hypertension: Secondary | ICD-10-CM | POA: Diagnosis not present

## 2024-02-23 DIAGNOSIS — Z961 Presence of intraocular lens: Secondary | ICD-10-CM | POA: Diagnosis not present

## 2024-02-23 DIAGNOSIS — Z79899 Other long term (current) drug therapy: Secondary | ICD-10-CM

## 2024-02-23 DIAGNOSIS — Z9049 Acquired absence of other specified parts of digestive tract: Secondary | ICD-10-CM | POA: Diagnosis not present

## 2024-02-23 DIAGNOSIS — Z888 Allergy status to other drugs, medicaments and biological substances status: Secondary | ICD-10-CM

## 2024-02-23 DIAGNOSIS — M80052A Age-related osteoporosis with current pathological fracture, left femur, initial encounter for fracture: Principal | ICD-10-CM | POA: Diagnosis present

## 2024-02-23 DIAGNOSIS — S42009A Fracture of unspecified part of unspecified clavicle, initial encounter for closed fracture: Secondary | ICD-10-CM | POA: Insufficient documentation

## 2024-02-23 DIAGNOSIS — M8000XA Age-related osteoporosis with current pathological fracture, unspecified site, initial encounter for fracture: Secondary | ICD-10-CM | POA: Diagnosis not present

## 2024-02-23 DIAGNOSIS — G319 Degenerative disease of nervous system, unspecified: Secondary | ICD-10-CM | POA: Diagnosis not present

## 2024-02-23 DIAGNOSIS — W010XXA Fall on same level from slipping, tripping and stumbling without subsequent striking against object, initial encounter: Secondary | ICD-10-CM | POA: Diagnosis not present

## 2024-02-23 DIAGNOSIS — M1990 Unspecified osteoarthritis, unspecified site: Secondary | ICD-10-CM | POA: Diagnosis not present

## 2024-02-23 DIAGNOSIS — Z85820 Personal history of malignant melanoma of skin: Secondary | ICD-10-CM

## 2024-02-23 DIAGNOSIS — Z881 Allergy status to other antibiotic agents status: Secondary | ICD-10-CM

## 2024-02-23 DIAGNOSIS — E785 Hyperlipidemia, unspecified: Secondary | ICD-10-CM | POA: Diagnosis not present

## 2024-02-23 DIAGNOSIS — Z043 Encounter for examination and observation following other accident: Secondary | ICD-10-CM | POA: Diagnosis not present

## 2024-02-23 DIAGNOSIS — Z7401 Bed confinement status: Secondary | ICD-10-CM | POA: Diagnosis not present

## 2024-02-23 DIAGNOSIS — S72002A Fracture of unspecified part of neck of left femur, initial encounter for closed fracture: Secondary | ICD-10-CM | POA: Diagnosis not present

## 2024-02-23 DIAGNOSIS — Z66 Do not resuscitate: Secondary | ICD-10-CM | POA: Diagnosis not present

## 2024-02-23 DIAGNOSIS — Z885 Allergy status to narcotic agent status: Secondary | ICD-10-CM | POA: Diagnosis not present

## 2024-02-23 DIAGNOSIS — F418 Other specified anxiety disorders: Secondary | ICD-10-CM | POA: Diagnosis not present

## 2024-02-23 DIAGNOSIS — R000 Tachycardia, unspecified: Secondary | ICD-10-CM | POA: Diagnosis not present

## 2024-02-23 DIAGNOSIS — G9389 Other specified disorders of brain: Secondary | ICD-10-CM | POA: Diagnosis not present

## 2024-02-23 DIAGNOSIS — M25552 Pain in left hip: Secondary | ICD-10-CM | POA: Diagnosis not present

## 2024-02-23 LAB — CBC WITH DIFFERENTIAL/PLATELET
Abs Immature Granulocytes: 0.09 10*3/uL — ABNORMAL HIGH (ref 0.00–0.07)
Basophils Absolute: 0.1 10*3/uL (ref 0.0–0.1)
Basophils Relative: 0 %
Eosinophils Absolute: 0.1 10*3/uL (ref 0.0–0.5)
Eosinophils Relative: 1 %
HCT: 40.9 % (ref 36.0–46.0)
Hemoglobin: 13.7 g/dL (ref 12.0–15.0)
Immature Granulocytes: 1 %
Lymphocytes Relative: 11 %
Lymphs Abs: 1.7 10*3/uL (ref 0.7–4.0)
MCH: 31.5 pg (ref 26.0–34.0)
MCHC: 33.5 g/dL (ref 30.0–36.0)
MCV: 94 fL (ref 80.0–100.0)
Monocytes Absolute: 0.6 10*3/uL (ref 0.1–1.0)
Monocytes Relative: 4 %
Neutro Abs: 12.2 10*3/uL — ABNORMAL HIGH (ref 1.7–7.7)
Neutrophils Relative %: 83 %
Platelets: 352 10*3/uL (ref 150–400)
RBC: 4.35 MIL/uL (ref 3.87–5.11)
RDW: 13.9 % (ref 11.5–15.5)
WBC: 14.7 10*3/uL — ABNORMAL HIGH (ref 4.0–10.5)
nRBC: 0 % (ref 0.0–0.2)

## 2024-02-23 LAB — BASIC METABOLIC PANEL WITH GFR
Anion gap: 13 (ref 5–15)
BUN: 20 mg/dL (ref 8–23)
CO2: 23 mmol/L (ref 22–32)
Calcium: 9 mg/dL (ref 8.9–10.3)
Chloride: 100 mmol/L (ref 98–111)
Creatinine, Ser: 0.52 mg/dL (ref 0.44–1.00)
GFR, Estimated: >60 mL/min (ref >60)
Glucose, Bld: 120 mg/dL — ABNORMAL HIGH (ref 70–99)
Potassium: 4 mmol/L (ref 3.5–5.1)
Sodium: 136 mmol/L (ref 135–145)

## 2024-02-23 LAB — TYPE AND SCREEN
ABO/RH(D): O POS
Antibody Screen: NEGATIVE

## 2024-02-23 LAB — PROTIME-INR
INR: 1.1 (ref 0.8–1.2)
Prothrombin Time: 14.2 s (ref 11.4–15.2)

## 2024-02-23 MED ORDER — FENTANYL CITRATE PF 50 MCG/ML IJ SOSY
50.0000 ug | PREFILLED_SYRINGE | INTRAMUSCULAR | Status: DC | PRN
  Administered 2024-02-23: 50 ug via INTRAVENOUS
  Filled 2024-02-23: qty 1

## 2024-02-23 MED ORDER — SODIUM CHLORIDE 0.9 % IV SOLN: INTRAVENOUS | Status: AC

## 2024-02-23 MED ORDER — LABETALOL HCL 5 MG/ML IV SOLN
5.0000 mg | INTRAVENOUS | Status: DC | PRN
  Administered 2024-02-23: 5 mg via INTRAVENOUS
  Filled 2024-02-23: qty 4

## 2024-02-23 MED ORDER — HYDROMORPHONE HCL 1 MG/ML IJ SOLN
1.0000 mg | Freq: Once | INTRAMUSCULAR | Status: AC
  Administered 2024-02-23: 1 mg via INTRAVENOUS
  Filled 2024-02-23: qty 1

## 2024-02-23 MED ORDER — FENTANYL CITRATE PF 50 MCG/ML IJ SOSY
25.0000 ug | PREFILLED_SYRINGE | INTRAMUSCULAR | Status: DC | PRN
  Administered 2024-02-24 (×2): 25 ug via INTRAVENOUS
  Filled 2024-02-23 (×2): qty 1

## 2024-02-23 MED ORDER — ONDANSETRON HCL 4 MG/2ML IJ SOLN
4.0000 mg | Freq: Once | INTRAMUSCULAR | Status: AC
  Administered 2024-02-23: 4 mg via INTRAVENOUS
  Filled 2024-02-23: qty 2

## 2024-02-23 NOTE — ED Provider Notes (Signed)
 Frances Ortiz   CSN: [id number] Arrival date & time: 02/23/24  1658     History  Chief Complaint  Patient presents with   Fall   Hip Pain    Frances Ortiz is a 89 y.o. female.  Patient is a 89 year old female with a history of GERD, hyperlipidemia with prior right hip replacement who is presenting today after a fall.  Patient reports she was going to open her refrigerator door and pulled a little too hard causing her to lose her balance and fall on the left side.  She landed on her hip, left shoulder and reports hitting her head on the ground.  She denies any loss of consciousness.  No vision changes or nausea vomiting.  She is complaining of severe pain in her left hip.  Also having some pain in the left arm.  She denies any chest pain, shortness of breath or abdominal pain.  She denies any anticoagulation.  Denies any numbness or tingling of the foot.  Reports feeling at her baseline today prior to this happening.  The history is provided by the patient, the EMS personnel and medical records.  Fall  Hip Pain       Home Medications Prior to Admission medications   Medication Sig Start Date End Date Taking? Authorizing Provider  acetaminophen  (TYLENOL ) 650 MG CR tablet Take 650 mg by mouth 3 (three) times daily as needed for pain.    [provider]  bisacodyl  (DULCOLAX) 10 MG suppository Place 1 suppository (10 mg total) rectally daily as needed for moderate constipation. 06/25/23   Singh, Prashant K, MD  docusate sodium  (COLACE) 100 MG capsule Take 1 capsule (100 mg total) by mouth 2 (two) times daily. 06/25/23   Singh, Prashant K, MD  enoxaparin  (LOVENOX ) 40 MG/0.4ML injection Inject 0.4 mLs (40 mg total) into the skin daily for 30 doses. For 30 days post op for DVT prophylaxis 06/23/23 07/23/23  Albertus Alt, PA-C  ferrous sulfate  325 (65 FE) MG tablet Take 1 tablet (325 mg total) by mouth daily with breakfast.  06/25/23   Singh, Prashant K, MD  Metamucil Fiber CHEW Chew 3 tablets by mouth daily.    [provider]  metroNIDAZOLE  (FLAGYL ) 500 MG tablet Take 1 tablet (500 mg total) by mouth 2 (two) times daily. 09/13/23   Curatolo, Adam, DO  ondansetron  (ZOFRAN -ODT) 4 MG disintegrating tablet Take 1 tablet (4 mg total) by mouth every 8 (eight) hours as needed. 09/13/23   Curatolo, Adam, DO  pantoprazole  (PROTONIX ) 40 MG tablet Take 1 tablet (40 mg total) by mouth 2 (two) times daily. 07/16/23   Haydee Lipa, MD  polyethylene glycol (MIRALAX  / GLYCOLAX ) 17 g packet Take 17 g by mouth daily as needed for mild constipation. 06/25/23   Singh, Prashant K, MD  sertraline  (ZOLOFT ) 50 MG tablet Take 50 mg by mouth daily.    [provider]  traMADol  (ULTRAM ) 50 MG tablet Take 1 tablet (50 mg total) by mouth every 8 (eight) hours as needed for moderate pain. 06/23/23   Cockerham, Alicia M, PA-C      Allergies    Aristocort  [triamcinolone ], Cortisone, Erythromycin, Other, and Hydrocodone -acetaminophen     Review of Systems   Review of Systems  Physical Exam Updated Vital Signs BP (!) 203/118   Pulse (!) 101   Temp 97.9 F (36.6 C) (Oral)   Resp 20   Ht 4\' 10"  (1.473 m)  Wt 54 kg   SpO2 100%   BMI 24.87 kg/m  Physical Exam Vitals and nursing Ortiz reviewed.  Constitutional:      General: She is not in acute distress.    Appearance: She is well-developed.  HENT:     Head: Normocephalic.   Eyes:     Pupils: Pupils are equal, round, and reactive to light.  Cardiovascular:     Rate and Rhythm: Regular rhythm. Tachycardia present.     Heart sounds: Normal heart sounds. No murmur heard.    No friction rub.  Pulmonary:     Effort: Pulmonary effort is normal.     Breath sounds: Normal breath sounds. No wheezing or rales.  Abdominal:     General: Bowel sounds are normal. There is no distension.     Palpations: Abdomen is soft.     Tenderness: There is no abdominal tenderness.  There is no guarding or rebound.  Musculoskeletal:        General: Tenderness present.     Left shoulder: Tenderness and bony tenderness present. Normal range of motion. Normal pulse.       Arms:     Cervical back: No tenderness. No spinous process tenderness or muscular tenderness.     Left hip: Deformity and tenderness present. Decreased range of motion.     Left upper leg: Tenderness and bony tenderness present.     Right knee: Normal.     Left knee: Normal.     Right ankle: Normal. Normal pulse.     Left ankle: Normal. Normal pulse.     Comments: No edema  Skin:    General: Skin is warm and dry.     Findings: No rash.  Neurological:     Mental Status: She is alert and oriented to person, place, and time.     Cranial Nerves: No cranial nerve deficit.     Sensory: No sensory deficit.  Psychiatric:        Behavior: Behavior normal.     ED Results / Procedures / Treatments   Labs (all labs ordered are listed, but only abnormal results are displayed) Labs Reviewed  BASIC METABOLIC PANEL WITH GFR - Abnormal; Notable for the following components:      Result Value   Glucose, Bld 120 (*)    All other components within normal limits  CBC WITH DIFFERENTIAL/PLATELET - Abnormal; Notable for the following components:   WBC 14.7 (*)    Neutro Abs 12.2 (*)    Abs Immature Granulocytes 0.09 (*)    All other components within normal limits  PROTIME-INR  TYPE AND SCREEN    EKG EKG Interpretation Date/Time:  Sunday Feb 23 2024 18:01:18 EDT Ventricular Rate:  133 PR Interval:  159 QRS Duration:  94 QT Interval:  312 QTC Calculation: 447 R Axis:   -10  Text Interpretation: Sinus tachycardia with frequent Atrial premature complexes Confirmed by Almond Army ([id number]) on 02/23/2024 6:05:02 PM  Radiology DG Shoulder Left Portable Result Date: 02/23/2024 CLINICAL DATA:  Marvell Slider.  Left shoulder pain EXAM: LEFT SHOULDER COMPARISON:  None Available. FINDINGS: There is a nondisplaced  fracture of the distal clavicle. The [facility] joint is intact. The glenohumeral joint is maintained. No definite left-sided rib fractures or pneumothorax. IMPRESSION: Nondisplaced distal left clavicle fracture. Electronically Signed   By: Marrian Siva M.D.   On: 02/23/2024 19:24   DG Pelvis Portable Result Date: 02/23/2024 CLINICAL DATA:  Marvell Slider.  Left hip pain. EXAM: PORTABLE PELVIS 1-2 VIEWS COMPARISON:  None Available. FINDINGS: Displaced intertrochanteric fracture of the left hip with significant varus deformity. The pubic symphysis and SI joints are intact. No pelvic fractures. Right hip prosthesis is intact. IMPRESSION: Displaced intertrochanteric fracture of the left hip. Electronically Signed   By: Marrian Siva M.D.   On: 02/23/2024 19:23   DG FEMUR MIN 2 VIEWS LEFT Result Date: 02/23/2024 CLINICAL DATA:  Marvell Slider.  Left leg pain. EXAM: LEFT FEMUR 2 VIEWS COMPARISON:  None Available. FINDINGS: Displaced intertrochanteric fracture of the left hip with significant varus deformity. The femoral shaft is intact. No left-sided pelvic fractures are identified. IMPRESSION: Displaced intertrochanteric fracture of the left hip. Electronically Signed   By: Marrian Siva M.D.   On: 02/23/2024 19:23   CT HEAD WO CONTRAST Result Date: 02/23/2024 CLINICAL DATA:  Marvell Slider. EXAM: CT HEAD WITHOUT CONTRAST CT CERVICAL SPINE WITHOUT CONTRAST TECHNIQUE: Multidetector CT imaging of the head and cervical spine was performed following the standard protocol without intravenous contrast. Multiplanar CT image reconstructions of the cervical spine were also generated. RADIATION DOSE REDUCTION: This exam was performed according to the departmental dose-optimization program which includes automated exposure control, adjustment of the mA and/or kV according to patient size and/or use of iterative reconstruction technique. COMPARISON:  Cervical spine CT scan from 06/21/2023 FINDINGS: CT HEAD FINDINGS Brain: Age related cerebral atrophy,  ventriculomegaly and periventricular white matter disease. No extra-axial fluid collections are identified. No CT findings for acute hemispheric infarction or intracranial hemorrhage. No mass lesions. The brainstem and cerebellum are normal. Vascular: Vascular calcifications but no aneurysm or hyperdense vessels. Skull: Normal. Negative for fracture or focal lesion. Sinuses/Orbits: The paranasal sinuses and mastoid air cells are clear except for chronic sinusitis involving the right half of the sphenoid sinus. The globes are intact. Other: No scalp lesions or scalp hematoma. CT CERVICAL SPINE FINDINGS Alignment: Normal. Skull base and vertebrae: No acute fracture. No primary bone lesion or focal pathologic process. Stable sclerotic lesion involving the right aspect of C1 consistent with a benign bone island. Soft tissues and spinal canal: No prevertebral fluid or swelling. No visible canal hematoma. Disc levels: The spinal canal is quite generous. No significant canal stenosis. Advanced multilevel facet disease, unchanged. Mild multilevel bony foraminal narrowing. Upper chest: The lung apices are grossly clear. Other: Carotid artery calcifications are noted. No neck mass or hematoma. IMPRESSION: 1. Age related cerebral atrophy, ventriculomegaly and periventricular white matter disease. 2. No acute intracranial findings or skull fracture. 3. Chronic right half sphenoid sinusitis. 4. Normal alignment of the cervical vertebral bodies and no acute fracture. 5. Advanced multilevel facet disease. Electronically Signed   By: Marrian Siva M.D.   On: 02/23/2024 19:22   CT CERVICAL SPINE WO CONTRAST Result Date: 02/23/2024 CLINICAL DATA:  Marvell Slider. EXAM: CT HEAD WITHOUT CONTRAST CT CERVICAL SPINE WITHOUT CONTRAST TECHNIQUE: Multidetector CT imaging of the head and cervical spine was performed following the standard protocol without intravenous contrast. Multiplanar CT image reconstructions of the cervical spine were also  generated. RADIATION DOSE REDUCTION: This exam was performed according to the departmental dose-optimization program which includes automated exposure control, adjustment of the mA and/or kV according to patient size and/or use of iterative reconstruction technique. COMPARISON:  Cervical spine CT scan from 06/21/2023 FINDINGS: CT HEAD FINDINGS Brain: Age related cerebral atrophy, ventriculomegaly and periventricular white matter disease. No extra-axial fluid collections are identified. No CT findings for acute hemispheric infarction or intracranial hemorrhage. No mass lesions. The brainstem and cerebellum are normal. Vascular: Vascular calcifications but  no aneurysm or hyperdense vessels. Skull: Normal. Negative for fracture or focal lesion. Sinuses/Orbits: The paranasal sinuses and mastoid air cells are clear except for chronic sinusitis involving the right half of the sphenoid sinus. The globes are intact. Other: No scalp lesions or scalp hematoma. CT CERVICAL SPINE FINDINGS Alignment: Normal. Skull base and vertebrae: No acute fracture. No primary bone lesion or focal pathologic process. Stable sclerotic lesion involving the right aspect of C1 consistent with a benign bone island. Soft tissues and spinal canal: No prevertebral fluid or swelling. No visible canal hematoma. Disc levels: The spinal canal is quite generous. No significant canal stenosis. Advanced multilevel facet disease, unchanged. Mild multilevel bony foraminal narrowing. Upper chest: The lung apices are grossly clear. Other: Carotid artery calcifications are noted. No neck mass or hematoma. IMPRESSION: 1. Age related cerebral atrophy, ventriculomegaly and periventricular white matter disease. 2. No acute intracranial findings or skull fracture. 3. Chronic right half sphenoid sinusitis. 4. Normal alignment of the cervical vertebral bodies and no acute fracture. 5. Advanced multilevel facet disease. Electronically Signed   By: Marrian Siva M.D.    On: 02/23/2024 19:22   DG Chest Port 1 View Result Date: 02/23/2024 CLINICAL DATA:  Follow-up. EXAM: PORTABLE CHEST 1 VIEW COMPARISON:  09/02/2023 FINDINGS: The cardiac silhouette, mediastinal and hilar contours are within normal limits. No acute pulmonary process. No infiltrates, edema, effusions or pneumothorax. The bony thorax is intact. No definite rib fractures. IMPRESSION: No acute cardiopulmonary findings. Electronically Signed   By: Marrian Siva M.D.   On: 02/23/2024 19:16    Procedures Procedures    Medications Ordered in ED Medications  0.9 %  sodium chloride  infusion ( Intravenous New Bag/Given 02/23/24 1721)  fentaNYL  (SUBLIMAZE ) injection 50 mcg (50 mcg Intravenous Given 02/23/24 1722)  HYDROmorphone  (DILAUDID ) injection 1 mg (has no administration in time range)  ondansetron  (ZOFRAN ) injection 4 mg (4 mg Intravenous Given 02/23/24 1721)    ED Course/ Medical Decision Making/ A&P                                 Medical Decision Making Amount and/or Complexity of Data Reviewed Independent Historian: EMS External Data Reviewed: notes. Labs: ordered. Decision-making details documented in ED Course. Radiology: ordered and independent interpretation performed. Decision-making details documented in ED Course. ECG/medicine tests: ordered and independent interpretation performed. Decision-making details documented in ED Course.  Risk Prescription drug management. Decision regarding hospitalization.   Pt with multiple medical problems and comorbidities and presenting today with a complaint that caries a high risk for morbidity and mortality.  Here today after a fall.  Patient complaining of significant pain in her left hip and leg area.  Has tenderness with palpation down the femur.  Neurovascularly intact at this time.  Also ecchymosis and tenderness of the left shoulder.  Patient does not take anticoagulation.  No chest or abdominal symptoms.  No prodromal symptoms prior to falling  feel it was related to trying to open her refrigerator door.  Hip fracture protocol initiated.  Patient was noted to be mildly tachycardic but may be related to pain as she is having significant pain at this time.  I independently interpreted patient's labs and EKG.  EKG shows a sinus rhythm and then a second EKG done shows a sinus rhythm with frequent PACs.  CBC with leukocytosis of 14 which is most likely acute phase reaction, BMP without acute findings, hemoglobin is normal. I  have independently visualized and interpreted pt's images today.  Chest x-ray within normal limits, shoulder image with concern for distal clavicle fracture.  Pelvis images show a left displaced intertrochanteric femur fracture.  Distal femur is normal.  Head CT without acute injury and cervical spine was negative.  Radiology reports the nondisplaced left clavicle fracture, hip fracture and normal chest x-ray.  Age-related cerebral atrophy ventriculomegaly and periventricular white matter disease. Patient still having significant pain from her fracture.  Consulted orthopedics and Dr. Bernard Brick said NPO after midnight.  Consulted hospitalist for admission.  Patient was also was placed in a sling for her arm.         Final Clinical Impression(s) / ED Diagnoses Final diagnoses:  Fall, initial encounter  Traumatic closed fracture of distal clavicle with minimal displacement, left, initial encounter  Displaced intertrochanteric fracture of left femur, initial encounter for closed fracture [facility])    Rx / DC Orders ED Discharge Orders     None         Almond Army, MD 02/23/24 2027

## 2024-02-23 NOTE — H&P (Signed)
 History and Physical    [name]:[id number] DOB: 1930-12-22 DOA: 02/23/2024  Patient coming from: Home.  Chief Complaint: Fall.  HPI: Frances Ortiz is a 89 y.o. female with history of cecal cancer status post resection and in remission had a fall at home while trying to open the refrigerator.  Patient had complained of left hip pain since the fall.  Denies losing consciousness.  Had some skin tear on the extremities after the fall.  ED Course: In the ER patient had CT head C-spine and x-rays which revealed left hip and left clavicle fracture.  On-call orthopedic surgeon Dr. Bernard Brick was consulted and patient is being admitted for surgery.  Labs show mild leukocytosis.    Review of Systems: As per HPI, rest all negative.   Past Medical History:  Diagnosis Date   Anxiety    GERD (gastroesophageal reflux disease)    Heart murmur    HLD (hyperlipidemia)    Intestinal obstruction (HCC)    Osteoporosis    PONV (postoperative nausea and vomiting)    Skin cancer    melanoma, basal cell, and squamous    Past Surgical History:  Procedure Laterality Date   ANTERIOR APPROACH HEMI HIP ARTHROPLASTY Right 06/22/2023   Procedure: POSTERIOR APPROACH HEMI HIP ARTHROPLASTY;  Surgeon: Osa Blase, MD;  Location: [facility] OR;  Service: Orthopedics;  Laterality: Right;   BIOPSY  07/14/2023   Procedure: BIOPSY;  Surgeon: Felecia Hopper, MD;  Location: [facility] ENDOSCOPY;  Service: Gastroenterology;;   BREAST BIOPSY     bilateral   BREAST EXCISIONAL BIOPSY Right 30 years ago   fibroadenoma   BREAST EXCISIONAL BIOPSY Left 40 years ago   sclerosis thickening   CATARACT EXTRACTION W/ INTRAOCULAR LENS  IMPLANT, BILATERAL Bilateral    CESAREAN SECTION     x 2   CHOLECYSTECTOMY N/A 11/27/2012   Procedure: LAPAROSCOPIC CHOLECYSTECTOMY WITH INTRAOPERATIVE CHOLANGIOGRAM;  Surgeon: Mayme Spearman, MD;  Location: [facility] OR;  Service: General;  Laterality: N/A;   COLECTOMY Right 10/07/2017   right    COLONOSCOPY  2005   COLONOSCOPY  08/2017   "found mass in cecum"   DILATION AND CURETTAGE OF UTERUS     ESOPHAGOGASTRODUODENOSCOPY     ESOPHAGOGASTRODUODENOSCOPY (EGD) WITH PROPOFOL  N/A 07/14/2023   Procedure: ESOPHAGOGASTRODUODENOSCOPY (EGD) WITH PROPOFOL ;  Surgeon: Felecia Hopper, MD;  Location: [facility];  Service: Gastroenterology;  Laterality: N/A;   IR KYPHO EA ADDL LEVEL THORACIC OR LUMBAR  02/27/2023   IR KYPHO LUMBAR INC FX REDUCE BONE BX UNI/BIL CANNULATION INC/IMAGING  02/27/2023   LAPAROSCOPIC RIGHT COLECTOMY Right 10/07/2017   Procedure: LAPAROSCOPIC RIGHT COLECTOMY ERAS PATHWAY;  Surgeon: Caralyn Chandler, MD;  Location: [facility];  Service: General;  Laterality: Right;   SKIN CANCER EXCISION     TONSILLECTOMY       reports that she has never smoked. She has never used smokeless tobacco. She reports that she does not drink alcohol and does not use drugs.  Allergies  Allergen Reactions   Aristocort  [Triamcinolone ] Other (See Comments)    Emotional instability   Cortisone Other (See Comments)    Emotional instability   Erythromycin Other (See Comments)    Stomach pain    Other Other (See Comments)    Epinephrine  in novocain makes heart race.   Hydrocodone -Acetaminophen  Rash    Family History  Problem Relation Age of Onset   Pneumonia Mother    Colon cancer Neg Hx    Colon polyps Neg Hx  Rectal cancer Neg Hx    Stomach cancer Neg Hx     Prior to Admission medications   Medication Sig Start Date End Date Taking? Authorizing Provider  acetaminophen  (TYLENOL ) 650 MG CR tablet Take 650 mg by mouth in the morning, at noon, in the evening, and at bedtime.   Yes [provider]  Metamucil Fiber CHEW Chew 3 tablets by mouth daily.   Yes [provider]  methocarbamol  (ROBAXIN ) 500 MG tablet Take 500 mg by mouth 2 (two) times daily.   Yes [provider]  sertraline  (ZOLOFT ) 50 MG tablet Take 50 mg by mouth daily.   Yes [provider]   traMADol  (ULTRAM ) 50 MG tablet Take 1 tablet (50 mg total) by mouth every 8 (eight) hours as needed for moderate pain. 06/23/23  Yes Albertus Alt, PA-C  bisacodyl  (DULCOLAX) 10 MG suppository Place 1 suppository (10 mg total) rectally daily as needed for moderate constipation. Patient not taking: Reported on 02/23/2024 06/25/23   Singh, Prashant K, MD  docusate sodium  (COLACE) 100 MG capsule Take 1 capsule (100 mg total) by mouth 2 (two) times daily. Patient not taking: Reported on 02/23/2024 06/25/23   Singh, Prashant K, MD  enoxaparin  (LOVENOX ) 40 MG/0.4ML injection Inject 0.4 mLs (40 mg total) into the skin daily for 30 doses. For 30 days post op for DVT prophylaxis Patient not taking: Reported on 02/23/2024 06/23/23 07/23/23  Albertus Alt, PA-C  ferrous sulfate  325 (65 FE) MG tablet Take 1 tablet (325 mg total) by mouth daily with breakfast. Patient not taking: Reported on 02/23/2024 06/25/23   Singh, Prashant K, MD  metroNIDAZOLE  (FLAGYL ) 500 MG tablet Take 1 tablet (500 mg total) by mouth 2 (two) times daily. 09/13/23   Curatolo, Adam, DO  ondansetron  (ZOFRAN -ODT) 4 MG disintegrating tablet Take 1 tablet (4 mg total) by mouth every 8 (eight) hours as needed. Patient not taking: Reported on 02/23/2024 09/13/23   Lowery Rue, DO  pantoprazole  (PROTONIX ) 40 MG tablet Take 1 tablet (40 mg total) by mouth 2 (two) times daily. Patient not taking: Reported on 02/23/2024 07/16/23   Haydee Lipa, MD  polyethylene glycol (MIRALAX  / GLYCOLAX ) 17 g packet Take 17 g by mouth daily as needed for mild constipation. Patient not taking: Reported on 02/23/2024 06/25/23   Cala Castleman, MD    Physical Exam: Constitutional: Moderately built and nourished. Vitals:   02/23/24 1915 02/23/24 1952 02/23/24 2000 02/23/24 2107  BP:  (!) 198/98 (!) 172/77 (!) 167/81  Pulse: (!) 101   (!) 124  Resp: 20 19 17 18   Temp:  98 F (36.7 C)  98.5 F (36.9 C)  TempSrc:  Oral    SpO2: 100% 100% 100% 99%  Weight:       Height:       Eyes: Anicteric no pallor. ENMT: No discharge from the ears eyes nose or mouth. Neck: No mass felt.  No neck rigidity. Respiratory: No rhonchi or crepitations. Cardiovascular: S1-S2 heard. Abdomen: Soft nontender bowel sounds present. Musculoskeletal: Pain on my left hip and left upper extremity. Skin: No rash. Neurologic: Alert awake oriented to time place and person.  Moves all extremities. Psychiatric: Appears normal.  Normal affect.   Labs on Admission: I have personally reviewed following labs and imaging studies  CBC: Recent Labs  Lab 02/23/24 1706  WBC 14.7*  NEUTROABS 12.2*  HGB 13.7  HCT 40.9  MCV 94.0  PLT 352   Basic Metabolic Panel: Recent Labs  Lab 02/23/24 1706  NA 136  K 4.0  CL 100  CO2 23  GLUCOSE 120*  BUN 20  CREATININE 0.52  CALCIUM 9.0   GFR: Estimated Creatinine Clearance: 34 mL/min (by C-G formula based on SCr of 0.52 mg/dL). Liver Function Tests: No results for input(s): "AST", "ALT", "ALKPHOS", "BILITOT", "PROT", "ALBUMIN" in the last 168 hours. No results for input(s): "LIPASE", "AMYLASE" in the last 168 hours. No results for input(s): "AMMONIA" in the last 168 hours. Coagulation Profile: Recent Labs  Lab 02/23/24 1706  INR 1.1   Cardiac Enzymes: No results for input(s): "CKTOTAL", "CKMB", "CKMBINDEX", "TROPONINI" in the last 168 hours. BNP (last 3 results) No results for input(s): "PROBNP" in the last 8760 hours. HbA1C: No results for input(s): "HGBA1C" in the last 72 hours. CBG: No results for input(s): "GLUCAP" in the last 168 hours. Lipid Profile: No results for input(s): "CHOL", "HDL", "LDLCALC", "TRIG", "CHOLHDL", "LDLDIRECT" in the last 72 hours. Thyroid Function Tests: No results for input(s): "TSH", "T4TOTAL", "FREET4", "T3FREE", "THYROIDAB" in the last 72 hours. Anemia Panel: No results for input(s): "VITAMINB12", "FOLATE", "FERRITIN", "TIBC", "IRON", "RETICCTPCT" in the last 72 hours. Urine  analysis:    Component Value Date/Time   COLORURINE YELLOW 09/13/2023 1912   APPEARANCEUR CLEAR 09/13/2023 1912   LABSPEC 1.025 09/13/2023 1912   PHURINE 5.5 09/13/2023 1912   GLUCOSEU NEGATIVE 09/13/2023 1912   HGBUR NEGATIVE 09/13/2023 1912   BILIRUBINUR NEGATIVE 09/13/2023 1912   KETONESUR 40 (A) 09/13/2023 1912   PROTEINUR 30 (A) 09/13/2023 1912   NITRITE POSITIVE (A) 09/13/2023 1912   LEUKOCYTESUR LARGE (A) 09/13/2023 1912   Sepsis Labs: @LABRCNTIP (procalcitonin:4,lacticidven:4) )No results found for this or any previous visit (from the past 240 hours).   Radiological Exams on Admission: DG Shoulder Left Portable Result Date: 02/23/2024 CLINICAL DATA:  Marvell Slider.  Left shoulder pain EXAM: LEFT SHOULDER COMPARISON:  None Available. FINDINGS: There is a nondisplaced fracture of the distal clavicle. The [facility] joint is intact. The glenohumeral joint is maintained. No definite left-sided rib fractures or pneumothorax. IMPRESSION: Nondisplaced distal left clavicle fracture. Electronically Signed   By: Marrian Siva M.D.   On: 02/23/2024 19:24   DG Pelvis Portable Result Date: 02/23/2024 CLINICAL DATA:  Marvell Slider.  Left hip pain. EXAM: PORTABLE PELVIS 1-2 VIEWS COMPARISON:  None Available. FINDINGS: Displaced intertrochanteric fracture of the left hip with significant varus deformity. The pubic symphysis and SI joints are intact. No pelvic fractures. Right hip prosthesis is intact. IMPRESSION: Displaced intertrochanteric fracture of the left hip. Electronically Signed   By: Marrian Siva M.D.   On: 02/23/2024 19:23   DG FEMUR MIN 2 VIEWS LEFT Result Date: 02/23/2024 CLINICAL DATA:  Marvell Slider.  Left leg pain. EXAM: LEFT FEMUR 2 VIEWS COMPARISON:  None Available. FINDINGS: Displaced intertrochanteric fracture of the left hip with significant varus deformity. The femoral shaft is intact. No left-sided pelvic fractures are identified. IMPRESSION: Displaced intertrochanteric fracture of the left hip. Electronically  Signed   By: Marrian Siva M.D.   On: 02/23/2024 19:23   CT HEAD WO CONTRAST Result Date: 02/23/2024 CLINICAL DATA:  Marvell Slider. EXAM: CT HEAD WITHOUT CONTRAST CT CERVICAL SPINE WITHOUT CONTRAST TECHNIQUE: Multidetector CT imaging of the head and cervical spine was performed following the standard protocol without intravenous contrast. Multiplanar CT image reconstructions of the cervical spine were also generated. RADIATION DOSE REDUCTION: This exam was performed according to the departmental dose-optimization program which includes automated exposure control, adjustment of the mA and/or kV according to patient  size and/or use of iterative reconstruction technique. COMPARISON:  Cervical spine CT scan from 06/21/2023 FINDINGS: CT HEAD FINDINGS Brain: Age related cerebral atrophy, ventriculomegaly and periventricular white matter disease. No extra-axial fluid collections are identified. No CT findings for acute hemispheric infarction or intracranial hemorrhage. No mass lesions. The brainstem and cerebellum are normal. Vascular: Vascular calcifications but no aneurysm or hyperdense vessels. Skull: Normal. Negative for fracture or focal lesion. Sinuses/Orbits: The paranasal sinuses and mastoid air cells are clear except for chronic sinusitis involving the right half of the sphenoid sinus. The globes are intact. Other: No scalp lesions or scalp hematoma. CT CERVICAL SPINE FINDINGS Alignment: Normal. Skull base and vertebrae: No acute fracture. No primary bone lesion or focal pathologic process. Stable sclerotic lesion involving the right aspect of C1 consistent with a benign bone island. Soft tissues and spinal canal: No prevertebral fluid or swelling. No visible canal hematoma. Disc levels: The spinal canal is quite generous. No significant canal stenosis. Advanced multilevel facet disease, unchanged. Mild multilevel bony foraminal narrowing. Upper chest: The lung apices are grossly clear. Other: Carotid artery  calcifications are noted. No neck mass or hematoma. IMPRESSION: 1. Age related cerebral atrophy, ventriculomegaly and periventricular white matter disease. 2. No acute intracranial findings or skull fracture. 3. Chronic right half sphenoid sinusitis. 4. Normal alignment of the cervical vertebral bodies and no acute fracture. 5. Advanced multilevel facet disease. Electronically Signed   By: Marrian Siva M.D.   On: 02/23/2024 19:22   CT CERVICAL SPINE WO CONTRAST Result Date: 02/23/2024 CLINICAL DATA:  Marvell Slider. EXAM: CT HEAD WITHOUT CONTRAST CT CERVICAL SPINE WITHOUT CONTRAST TECHNIQUE: Multidetector CT imaging of the head and cervical spine was performed following the standard protocol without intravenous contrast. Multiplanar CT image reconstructions of the cervical spine were also generated. RADIATION DOSE REDUCTION: This exam was performed according to the departmental dose-optimization program which includes automated exposure control, adjustment of the mA and/or kV according to patient size and/or use of iterative reconstruction technique. COMPARISON:  Cervical spine CT scan from 06/21/2023 FINDINGS: CT HEAD FINDINGS Brain: Age related cerebral atrophy, ventriculomegaly and periventricular white matter disease. No extra-axial fluid collections are identified. No CT findings for acute hemispheric infarction or intracranial hemorrhage. No mass lesions. The brainstem and cerebellum are normal. Vascular: Vascular calcifications but no aneurysm or hyperdense vessels. Skull: Normal. Negative for fracture or focal lesion. Sinuses/Orbits: The paranasal sinuses and mastoid air cells are clear except for chronic sinusitis involving the right half of the sphenoid sinus. The globes are intact. Other: No scalp lesions or scalp hematoma. CT CERVICAL SPINE FINDINGS Alignment: Normal. Skull base and vertebrae: No acute fracture. No primary bone lesion or focal pathologic process. Stable sclerotic lesion involving the right  aspect of C1 consistent with a benign bone island. Soft tissues and spinal canal: No prevertebral fluid or swelling. No visible canal hematoma. Disc levels: The spinal canal is quite generous. No significant canal stenosis. Advanced multilevel facet disease, unchanged. Mild multilevel bony foraminal narrowing. Upper chest: The lung apices are grossly clear. Other: Carotid artery calcifications are noted. No neck mass or hematoma. IMPRESSION: 1. Age related cerebral atrophy, ventriculomegaly and periventricular white matter disease. 2. No acute intracranial findings or skull fracture. 3. Chronic right half sphenoid sinusitis. 4. Normal alignment of the cervical vertebral bodies and no acute fracture. 5. Advanced multilevel facet disease. Electronically Signed   By: Marrian Siva M.D.   On: 02/23/2024 19:22   DG Chest Port 1 View Result Date: 02/23/2024 CLINICAL  DATA:  Follow-up. EXAM: PORTABLE CHEST 1 VIEW COMPARISON:  09/02/2023 FINDINGS: The cardiac silhouette, mediastinal and hilar contours are within normal limits. No acute pulmonary process. No infiltrates, edema, effusions or pneumothorax. The bony thorax is intact. No definite rib fractures. IMPRESSION: No acute cardiopulmonary findings. Electronically Signed   By: Marrian Siva M.D.   On: 02/23/2024 19:16    EKG: Independently reviewed.  Normal sinus rhythm.  Assessment/Plan Principal Problem:   Hip fracture (HCC) Active Problems:   Cecal cancer (HCC)   S/P right colectomy   Elevated blood pressure reading   Clavicular fracture    Left hip and left clavicle fracture orthopedic surgery has been consulted.  Orthopedic planning surgery in the morning for the left hip.  Will keep patient on pain relief medications and n.p.o. past midnight. Elevated blood pressure readings -      patient states she does not have hypertension and has not taken any antihypertensives.  Follow blood pressure trends.  Will try pain control which may improve blood  pressure.  As needed IV labetalol  ordered.  Follow blood pressure trends. Leukocytosis could be reactionary. History of cecal cancer s/p resection and in remission.   Since patient has hip fracture will need close monitoring further workup and more than 2 midnight stay.  DVT prophylaxis: SCDs. Code Status: DNR. Family Communication: Patient's son at the bedside. Disposition Plan: Medical floor. Consults called: Orthopedics. Admission status: Inpatient.

## 2024-02-23 NOTE — ED Triage Notes (Signed)
 Per EMS, Pt, from home, c/o L hip pain and skin tear on L forearm r/t a stumble and fall this afternoon.  Pain score 10/10.  Pt reports she was trying to open her fridge and fell.   Pt is unable to straighten L leg.  50mcg Fentanyl  given IN.

## 2024-02-23 NOTE — Progress Notes (Signed)
 [name], female   DOB: [birth year], 89 y.o.   MRN: [id number]  Consult for left hip fracture Full consult to follow  Will need operative fixation of her left intertrochanteric hip fracture, likely tomorrow  NPO after MN for   Left distal clavicle fracture - sling for comfort Weight bearing will be established peri-operatively

## 2024-02-23 NOTE — ED Notes (Signed)
 Pt's oxygen noted to be in mid-80s and heart rate has increased to 120s.  Pt placed on 2L [place] and new EKG shot and given to EDP.

## 2024-02-24 ENCOUNTER — Inpatient Hospital Stay (HOSPITAL_COMMUNITY)

## 2024-02-24 ENCOUNTER — Other Ambulatory Visit: Payer: Self-pay

## 2024-02-24 ENCOUNTER — Inpatient Hospital Stay (HOSPITAL_COMMUNITY): Admitting: Anesthesiology

## 2024-02-24 ENCOUNTER — Encounter (HOSPITAL_COMMUNITY)
Admission: EM | Disposition: A | Discharge status: Skilled Nursing Facility | Payer: Self-pay | Source: Home / Self Care | Attending: Internal Medicine

## 2024-02-24 ENCOUNTER — Encounter (HOSPITAL_COMMUNITY): Payer: Self-pay | Admitting: Internal Medicine

## 2024-02-24 DIAGNOSIS — C180 Malignant neoplasm of cecum: Secondary | ICD-10-CM

## 2024-02-24 DIAGNOSIS — D72829 Elevated white blood cell count, unspecified: Secondary | ICD-10-CM | POA: Diagnosis not present

## 2024-02-24 DIAGNOSIS — S72002A Fracture of unspecified part of neck of left femur, initial encounter for closed fracture: Secondary | ICD-10-CM | POA: Diagnosis not present

## 2024-02-24 DIAGNOSIS — F418 Other specified anxiety disorders: Secondary | ICD-10-CM

## 2024-02-24 DIAGNOSIS — S72142A Displaced intertrochanteric fracture of left femur, initial encounter for closed fracture: Secondary | ICD-10-CM

## 2024-02-24 DIAGNOSIS — I10 Essential (primary) hypertension: Secondary | ICD-10-CM

## 2024-02-24 DIAGNOSIS — R030 Elevated blood-pressure reading, without diagnosis of hypertension: Secondary | ICD-10-CM | POA: Diagnosis not present

## 2024-02-24 LAB — CBC
HCT: 37.5 % (ref 36.0–46.0)
Hemoglobin: 12.4 g/dL (ref 12.0–15.0)
MCH: 31.2 pg (ref 26.0–34.0)
MCHC: 33.1 g/dL (ref 30.0–36.0)
MCV: 94.2 fL (ref 80.0–100.0)
Platelets: 293 10*3/uL (ref 150–400)
RBC: 3.98 MIL/uL (ref 3.87–5.11)
RDW: 13.8 % (ref 11.5–15.5)
WBC: 12.5 10*3/uL — ABNORMAL HIGH (ref 4.0–10.5)
nRBC: 0 % (ref 0.0–0.2)

## 2024-02-24 LAB — BASIC METABOLIC PANEL WITH GFR
Anion gap: 9 (ref 5–15)
BUN: 12 mg/dL (ref 8–23)
CO2: 25 mmol/L (ref 22–32)
Calcium: 8.6 mg/dL — ABNORMAL LOW (ref 8.9–10.3)
Chloride: 101 mmol/L (ref 98–111)
Creatinine, Ser: 0.45 mg/dL (ref 0.44–1.00)
GFR, Estimated: >60 mL/min (ref >60)
Glucose, Bld: 132 mg/dL — ABNORMAL HIGH (ref 70–99)
Potassium: 3.7 mmol/L (ref 3.5–5.1)
Sodium: 135 mmol/L (ref 135–145)

## 2024-02-24 LAB — SURGICAL PCR SCREEN
MRSA, PCR: NEGATIVE
Staphylococcus aureus: NEGATIVE

## 2024-02-24 SURGERY — FIXATION, FRACTURE, INTERTROCHANTERIC, WITH INTRAMEDULLARY ROD
Anesthesia: General | Laterality: Left

## 2024-02-24 MED ORDER — METOCLOPRAMIDE HCL 5 MG/ML IJ SOLN: 5.0000 mg | Freq: Three times a day (TID) | INTRAMUSCULAR | Status: DC | PRN

## 2024-02-24 MED ORDER — SUGAMMADEX SODIUM 200 MG/2ML IV SOLN
INTRAVENOUS | Status: DC | PRN
  Administered 2024-02-24: 200 mg via INTRAVENOUS

## 2024-02-24 MED ORDER — FENTANYL CITRATE (PF) 250 MCG/5ML IJ SOLN
INTRAMUSCULAR | Status: DC | PRN
  Administered 2024-02-24 (×2): 50 ug via INTRAVENOUS

## 2024-02-24 MED ORDER — PROPOFOL 10 MG/ML IV BOLUS
INTRAVENOUS | Status: AC
  Filled 2024-02-24: qty 20

## 2024-02-24 MED ORDER — CHLORHEXIDINE GLUCONATE 4 % EX SOLN: 60.0000 mL | Freq: Once | CUTANEOUS | Status: DC

## 2024-02-24 MED ORDER — CEFAZOLIN SODIUM-DEXTROSE 2-4 GM/100ML-% IV SOLN
2.0000 g | Freq: Four times a day (QID) | INTRAVENOUS | Status: AC
  Administered 2024-02-24 (×2): 2 g via INTRAVENOUS
  Filled 2024-02-24 (×2): qty 100

## 2024-02-24 MED ORDER — POVIDONE-IODINE 10 % EX SWAB
2.0000 | Freq: Once | CUTANEOUS | Status: AC
  Administered 2024-02-24: 2 via TOPICAL

## 2024-02-24 MED ORDER — HYDROMORPHONE HCL 1 MG/ML IJ SOLN
0.5000 mg | INTRAMUSCULAR | Status: DC | PRN
  Administered 2024-02-24 – 2024-02-27 (×8): 0.5 mg via INTRAVENOUS
  Filled 2024-02-24 (×8): qty 0.5

## 2024-02-24 MED ORDER — ONDANSETRON HCL 4 MG/2ML IJ SOLN
INTRAMUSCULAR | Status: AC
  Filled 2024-02-24: qty 4

## 2024-02-24 MED ORDER — PROPOFOL 500 MG/50ML IV EMUL
INTRAVENOUS | Status: DC | PRN
  Administered 2024-02-24: 50 ug/kg/min via INTRAVENOUS

## 2024-02-24 MED ORDER — PHENOL 1.4 % MT LIQD
1.0000 | OROMUCOSAL | Status: DC | PRN
  Filled 2024-02-24: qty 177

## 2024-02-24 MED ORDER — PHENYLEPHRINE HCL-NACL 20-0.9 MG/250ML-% IV SOLN
INTRAVENOUS | Status: DC | PRN
  Administered 2024-02-24: 75 ug/min via INTRAVENOUS

## 2024-02-24 MED ORDER — FENTANYL CITRATE (PF) 250 MCG/5ML IJ SOLN
INTRAMUSCULAR | Status: AC
  Filled 2024-02-24: qty 5

## 2024-02-24 MED ORDER — ENSURE ENLIVE PO LIQD
237.0000 mL | Freq: Two times a day (BID) | ORAL | Status: DC
  Administered 2024-02-24 – 2024-02-27 (×5): 237 mL via ORAL

## 2024-02-24 MED ORDER — DOCUSATE SODIUM 100 MG PO CAPS
100.0000 mg | ORAL_CAPSULE | Freq: Two times a day (BID) | ORAL | Status: DC
  Administered 2024-02-24 – 2024-02-27 (×5): 100 mg via ORAL
  Filled 2024-02-24 (×6): qty 1

## 2024-02-24 MED ORDER — ONDANSETRON HCL 4 MG/2ML IJ SOLN
4.0000 mg | Freq: Four times a day (QID) | INTRAMUSCULAR | Status: DC | PRN
  Administered 2024-02-25: 4 mg via INTRAVENOUS
  Filled 2024-02-24: qty 2

## 2024-02-24 MED ORDER — ROCURONIUM BROMIDE 10 MG/ML (PF) SYRINGE
PREFILLED_SYRINGE | INTRAVENOUS | Status: AC
  Filled 2024-02-24: qty 20

## 2024-02-24 MED ORDER — PHENYLEPHRINE 80 MCG/ML (10ML) SYRINGE FOR IV PUSH (FOR BLOOD PRESSURE SUPPORT)
PREFILLED_SYRINGE | INTRAVENOUS | Status: AC
  Filled 2024-02-24: qty 20

## 2024-02-24 MED ORDER — PHENYLEPHRINE 80 MCG/ML (10ML) SYRINGE FOR IV PUSH (FOR BLOOD PRESSURE SUPPORT)
PREFILLED_SYRINGE | INTRAVENOUS | Status: DC | PRN
Start: 2024-02-24 — End: 2024-02-24
  Administered 2024-02-24: 120 ug via INTRAVENOUS

## 2024-02-24 MED ORDER — 0.9 % SODIUM CHLORIDE (POUR BTL) OPTIME
TOPICAL | Status: DC | PRN
  Administered 2024-02-24: 1000 mL

## 2024-02-24 MED ORDER — ACETAMINOPHEN 500 MG PO TABS
1000.0000 mg | ORAL_TABLET | Freq: Three times a day (TID) | ORAL | Status: AC
  Administered 2024-02-25: 1000 mg via ORAL
  Filled 2024-02-24 (×3): qty 2

## 2024-02-24 MED ORDER — LIDOCAINE 2% (20 MG/ML) 5 ML SYRINGE
INTRAMUSCULAR | Status: DC | PRN
  Administered 2024-02-24: 40 mg via INTRAVENOUS

## 2024-02-24 MED ORDER — ORAL CARE MOUTH RINSE: 15.0000 mL | Freq: Once | OROMUCOSAL | Status: AC

## 2024-02-24 MED ORDER — LIDOCAINE 2% (20 MG/ML) 5 ML SYRINGE
INTRAMUSCULAR | Status: AC
  Filled 2024-02-24: qty 10

## 2024-02-24 MED ORDER — MENTHOL 3 MG MT LOZG: 1.0000 | LOZENGE | OROMUCOSAL | Status: DC | PRN

## 2024-02-24 MED ORDER — TRANEXAMIC ACID-NACL 1000-0.7 MG/100ML-% IV SOLN
1000.0000 mg | Freq: Once | INTRAVENOUS | Status: AC
  Administered 2024-02-24: 1000 mg via INTRAVENOUS
  Filled 2024-02-24: qty 100

## 2024-02-24 MED ORDER — ROCURONIUM BROMIDE 10 MG/ML (PF) SYRINGE
PREFILLED_SYRINGE | INTRAVENOUS | Status: DC | PRN
  Administered 2024-02-24: 50 mg via INTRAVENOUS

## 2024-02-24 MED ORDER — CHLORHEXIDINE GLUCONATE 0.12 % MT SOLN: 15.0000 mL | Freq: Once | OROMUCOSAL | Status: AC

## 2024-02-24 MED ORDER — DEXAMETHASONE SODIUM PHOSPHATE 10 MG/ML IJ SOLN
INTRAMUSCULAR | Status: AC
  Filled 2024-02-24: qty 2

## 2024-02-24 MED ORDER — PROPOFOL 10 MG/ML IV BOLUS
INTRAVENOUS | Status: DC | PRN
  Administered 2024-02-24: 60 mg via INTRAVENOUS

## 2024-02-24 MED ORDER — OXYCODONE HCL 5 MG PO TABS
5.0000 mg | ORAL_TABLET | Freq: Four times a day (QID) | ORAL | Status: DC | PRN
  Administered 2024-02-26 – 2024-02-27 (×5): 5 mg via ORAL
  Filled 2024-02-24 (×7): qty 1

## 2024-02-24 MED ORDER — ADULT MULTIVITAMIN W/MINERALS CH
1.0000 | ORAL_TABLET | Freq: Every day | ORAL | Status: DC
  Administered 2024-02-24 – 2024-02-27 (×4): 1 via ORAL
  Filled 2024-02-24 (×4): qty 1

## 2024-02-24 MED ORDER — ACETAMINOPHEN 10 MG/ML IV SOLN
INTRAVENOUS | Status: DC | PRN
  Administered 2024-02-24: 1000 mg via INTRAVENOUS

## 2024-02-24 MED ORDER — CHLORHEXIDINE GLUCONATE 0.12 % MT SOLN
OROMUCOSAL | Status: AC
  Administered 2024-02-24: 15 mL via OROMUCOSAL
  Filled 2024-02-24: qty 15

## 2024-02-24 MED ORDER — ONDANSETRON HCL 4 MG PO TABS: 4.0000 mg | ORAL_TABLET | Freq: Four times a day (QID) | ORAL | Status: DC | PRN

## 2024-02-24 MED ORDER — FENTANYL CITRATE (PF) 100 MCG/2ML IJ SOLN
INTRAMUSCULAR | Status: AC
  Filled 2024-02-24: qty 2

## 2024-02-24 MED ORDER — FENTANYL CITRATE (PF) 100 MCG/2ML IJ SOLN
25.0000 ug | INTRAMUSCULAR | Status: DC | PRN
  Administered 2024-02-24: 50 ug via INTRAVENOUS

## 2024-02-24 MED ORDER — CEFAZOLIN SODIUM-DEXTROSE 2-4 GM/100ML-% IV SOLN
INTRAVENOUS | Status: AC
  Filled 2024-02-24: qty 100

## 2024-02-24 MED ORDER — LACTATED RINGERS IV SOLN: INTRAVENOUS | Status: DC

## 2024-02-24 MED ORDER — ACETAMINOPHEN 10 MG/ML IV SOLN
INTRAVENOUS | Status: AC
  Filled 2024-02-24: qty 100

## 2024-02-24 MED ORDER — ONDANSETRON HCL 4 MG/2ML IJ SOLN
4.0000 mg | Freq: Once | INTRAMUSCULAR | Status: AC
  Administered 2024-02-24: 4 mg via INTRAVENOUS
  Filled 2024-02-24: qty 2

## 2024-02-24 MED ORDER — ENOXAPARIN SODIUM 40 MG/0.4ML IJ SOSY
40.0000 mg | PREFILLED_SYRINGE | INTRAMUSCULAR | Status: DC
  Administered 2024-02-25 – 2024-02-27 (×3): 40 mg via SUBCUTANEOUS
  Filled 2024-02-24 (×3): qty 0.4

## 2024-02-24 MED ORDER — CEFAZOLIN SODIUM-DEXTROSE 2-4 GM/100ML-% IV SOLN
2.0000 g | INTRAVENOUS | Status: AC
  Administered 2024-02-24: 2 g via INTRAVENOUS

## 2024-02-24 MED ORDER — ONDANSETRON HCL 4 MG/2ML IJ SOLN
INTRAMUSCULAR | Status: DC | PRN
  Administered 2024-02-24: 4 mg via INTRAVENOUS

## 2024-02-24 MED ORDER — METOCLOPRAMIDE HCL 5 MG PO TABS: 5.0000 mg | ORAL_TABLET | Freq: Three times a day (TID) | ORAL | Status: DC | PRN

## 2024-02-24 SURGICAL SUPPLY — 43 items
BAG COUNTER SPONGE SURGICOUNT (BAG) ×1 IMPLANT
BIT DRILL CANN LG 4.3MM (BIT) IMPLANT
BNDG COHESIVE 6X5 TAN ST LF (GAUZE/BANDAGES/DRESSINGS) IMPLANT
BRUSH SCRUB EZ PLAIN DRY (MISCELLANEOUS) ×2 IMPLANT
COVER PERINEAL POST (MISCELLANEOUS) ×1 IMPLANT
COVER SURGICAL LIGHT HANDLE (MISCELLANEOUS) ×2 IMPLANT
DRAPE C-ARM 42X72 X-RAY (DRAPES) ×1 IMPLANT
DRAPE C-ARMOR (DRAPES) ×1 IMPLANT
DRAPE HALF SHEET 40X57 (DRAPES) IMPLANT
DRAPE INCISE IOBAN 66X45 STRL (DRAPES) ×1 IMPLANT
DRAPE SURG ORHT 6 SPLT 77X108 (DRAPES) IMPLANT
DRAPE U-SHAPE 47X51 STRL (DRAPES) ×1 IMPLANT
DRESSING MEPILEX FLEX 4X4 (GAUZE/BANDAGES/DRESSINGS) ×1 IMPLANT
DRSG EMULSION OIL 3X3 NADH (GAUZE/BANDAGES/DRESSINGS) ×1 IMPLANT
DRSG MEPILEX POST OP 4X8 (GAUZE/BANDAGES/DRESSINGS) ×1 IMPLANT
ELECTRODE REM PT RTRN 9FT ADLT (ELECTROSURGICAL) ×1 IMPLANT
GLOVE BIO SURGEON STRL SZ7.5 (GLOVE) ×1 IMPLANT
GLOVE BIO SURGEON STRL SZ8 (GLOVE) ×1 IMPLANT
GLOVE BIOGEL PI IND STRL 7.5 (GLOVE) ×1 IMPLANT
GLOVE BIOGEL PI IND STRL 8 (GLOVE) ×1 IMPLANT
GLOVE SURG ORTHO LTX SZ7.5 (GLOVE) ×2 IMPLANT
GOWN STRL REUS W/ TWL LRG LVL3 (GOWN DISPOSABLE) ×2 IMPLANT
GOWN STRL REUS W/ TWL XL LVL3 (GOWN DISPOSABLE) ×1 IMPLANT
GUIDEPIN VERSANAIL DSP 3.2X444 (ORTHOPEDIC DISPOSABLE SUPPLIES) IMPLANT
HFN A/R SCREW 70MM (Screw) IMPLANT
KIT BASIN OR (CUSTOM PROCEDURE TRAY) ×1 IMPLANT
KIT TURNOVER KIT B (KITS) ×1 IMPLANT
MANIFOLD NEPTUNE II (INSTRUMENTS) ×1 IMPLANT
NAIL HIP FRACT 130D 9X180 (Orthopedic Implant) IMPLANT
NS IRRIG 1000ML POUR BTL (IV SOLUTION) ×1 IMPLANT
PACK GENERAL/GYN (CUSTOM PROCEDURE TRAY) ×1 IMPLANT
PAD ARMBOARD POSITIONER FOAM (MISCELLANEOUS) ×2 IMPLANT
SCREW BONE CORTICAL 5.0X30 (Screw) IMPLANT
SCREW LAG 6.5X80X10.5XLRG ST (Screw) IMPLANT
STAPLER VISISTAT 35W (STAPLE) ×1 IMPLANT
STOCKINETTE IMPERVIOUS LG (DRAPES) IMPLANT
SUT ETHILON 2 0 PSLX (SUTURE) ×1 IMPLANT
SUT VIC AB 0 CT1 27XBRD ANBCTR (SUTURE) ×1 IMPLANT
SUT VIC AB 1 CT1 27XBRD ANBCTR (SUTURE) ×1 IMPLANT
SUT VIC AB 2-0 CT1 TAPERPNT 27 (SUTURE) ×1 IMPLANT
TOWEL GREEN STERILE (TOWEL DISPOSABLE) ×2 IMPLANT
TOWEL GREEN STERILE FF (TOWEL DISPOSABLE) ×1 IMPLANT
WATER STERILE IRR 1000ML POUR (IV SOLUTION) ×1 IMPLANT

## 2024-02-24 NOTE — Anesthesia Procedure Notes (Signed)
 Procedure Name: Intubation Date/Time: 02/24/2024 10:54 AM  Performed by: Dyanna Glasgow, CRNAPre-anesthesia Checklist: Patient identified, Emergency Drugs available, Suction available, Patient being monitored and Timeout performed Patient Re-evaluated:Patient Re-evaluated prior to induction Oxygen Delivery Method: Circle system utilized Preoxygenation: Pre-oxygenation with 100% oxygen Induction Type: IV induction Ventilation: Mask ventilation without difficulty Laryngoscope Size: Mac and 3 Grade View: Grade II Tube type: Oral Tube size: 7.0 mm Number of attempts: 1 Airway Equipment and Method: Stylet Placement Confirmation: ETT inserted through vocal cords under direct vision, positive ETCO2 and breath sounds checked- equal and bilateral Secured at: 21 cm Tube secured with: Tape Dental Injury: Teeth and Oropharynx as per pre-operative assessment

## 2024-02-24 NOTE — Transfer of Care (Signed)
 Immediate Anesthesia Transfer of Care Note  Patient: Frances Ortiz  Procedure(s) Performed: FIXATION, FRACTURE, INTERTROCHANTERIC, WITH INTRAMEDULLARY ROD (Left)  Patient Location: PACU  Anesthesia Type:General  Level of Consciousness: drowsy  Airway & Oxygen Therapy: Patient Spontanous Breathing and Patient connected to nasal cannula oxygen  Post-op Assessment: Report given to RN and Post -op Vital signs reviewed and stable  Post vital signs: Reviewed and stable  Last Vitals:  Vitals Value Taken Time  BP 155/98 02/24/24 1218  Temp    Pulse 94 02/24/24 1219  Resp    SpO2 96 % 02/24/24 1219  Vitals shown include unfiled device data.  Last Pain:  Vitals:   02/24/24 0925  TempSrc: Oral  PainSc:       Patients Stated Pain Goal: 3 (02/23/24 2107)  Complications: No notable events documented.

## 2024-02-24 NOTE — Anesthesia Preprocedure Evaluation (Signed)
 Anesthesia Evaluation  Patient identified by MRN, date of birth, ID band Patient awake    Reviewed: Allergy & Precautions, NPO status , Patient's Chart, lab work & pertinent test results  History of Anesthesia Complications (+) PONV and history of anesthetic complications  Airway Mallampati: I  TM Distance: >3 FB Neck ROM: Full    Dental  (+) Teeth Intact, Dental Advisory Given   Pulmonary neg pulmonary ROS   breath sounds clear to auscultation       Cardiovascular hypertension, + Valvular Problems/Murmurs  Rhythm:Regular Rate:Normal     Neuro/Psych  PSYCHIATRIC DISORDERS Anxiety Depression    negative neurological ROS     GI/Hepatic Neg liver ROS,GERD  Medicated,,  Endo/Other  negative endocrine ROS    Renal/GU negative Renal ROS     Musculoskeletal negative musculoskeletal ROS (+)    Abdominal   Peds  Hematology  (+) Blood dyscrasia, anemia   Anesthesia Other Findings   Reproductive/Obstetrics                             Anesthesia Physical Anesthesia Plan  ASA: 3  Anesthesia Plan: General   Post-op Pain Management: Ofirmev  IV (intra-op)*   Induction: Intravenous  PONV Risk Score and Plan: 4 or greater and Ondansetron , Treatment may vary due to age or medical condition, TIVA and Propofol  infusion  Airway Management Planned: Oral ETT  Additional Equipment: None  Intra-op Plan:   Post-operative Plan: Extubation in OR  Informed Consent: I have reviewed the patients History and Physical, chart, labs and discussed the procedure including the risks, benefits and alternatives for the proposed anesthesia with the patient or authorized representative who has indicated his/her understanding and acceptance.   Patient has DNR.  Discussed DNR with patient and Suspend DNR.   Dental advisory given  Plan Discussed with: CRNA  Anesthesia Plan Comments:        Anesthesia Quick  Evaluation

## 2024-02-24 NOTE — Op Note (Addendum)
 02/24/2024  PATIENT:  Frances Ortiz  [birth year] female   MEDICAL RECORD NUMBER: [id number]  PRE-OPERATIVE DIAGNOSIS: 1. Left intertrochanteric hip fracture 2. Left distal clavicle fracture  POST-OPERATIVE DIAGNOSIS:  1. Left intertrochanteric hip fracture 2. Left distal clavicle fracture  PROCEDURE:   1. INTRAMEDULLARY NAILING OF THE LEFT HIP using a statically locked [employer] Affixus nail 9 x 180 mm short. 2. Closed treatment of left distal clavicle fracture  SURGEON:  Homero Luster. Guyann Leitz, M.D.  ASSISTANT:  PA Student.  ANESTHESIA:  General.  COMPLICATIONS:  None.  ESTIMATED BLOOD LOSS:  Less than 150 mL.  DISPOSITION:  To PACU.  CONDITION:  Stable.  DELAY START OF DVT PROPHYLAXIS BECAUSE OF BLEEDING RISK: NO  BRIEF SUMMARY AND INDICATION OF PROCEDURE:  Frances Ortiz is a 89 y.o. year- old with multiple medical problems.  I discussed with the patient and family risks and benefits of surgical treatment including the potential for malunion, nonunion, symptomatic hardware, heart attack, stroke, neurovascular injury, bleeding, and others.  After acknowledging these risks, consent was provided consent to proceed.  BRIEF SUMMARY OF PROCEDURE:  The patient was taken to the operating room where general anesthesia was induced.  She was positioned supine on the fracture table.  A closed reduction maneuver was performed of the fractured proximal femur and this was confirmed on both AP and lateral xray views. A thorough scrub and wash with chlorhexidine  and then Betadine  scrub and paint was performed.  After sterile drapes and time-out, a long instrument was used to identify the appropriate starting position under C-arm on both AP and lateral images.  A 3 cm incision was made proximal to the greater trochanter.  The curved cannulated awl was inserted just medial to the tip of the lateral trochanter and then the starting guidewire advanced into the proximal femur.  This was checked on AP and  lateral views.  The starting reamer was engaged with the soft tissue protected by a sleeve.  The nail was inserted to the appropriate depth.  The guidewire for the lag screw was then inserted with the appropriate anteversion to make sure it was in a center-center position.  An additional guidepin was placed as an antirotation device and eventually changed for a screw.  We first placed the lag screw checking position on both views.  The set screw was then engaged within the groove of the lag screw, which was allowed to telescope.  Traction was released and compression achieved with the compression device over the lag screw.  This was followed by placement of one distal locking screw using the jig.  This was confirmed on AP and lateral images. Wounds were irrigated thoroughly, closed in a standard layered fashion. Sterile gently compressive dressings were applied.  Marisela Sicks, PA-C, assisted throughout.  The patient was awakened from anesthesia and transported to the PACU in stable condition.  PROGNOSIS:  The patient will be weightbearing as tolerated through both the hip and the left clavicle with physical therapy beginning DVT prophylaxis as soon as deemed stable by the Primary Care Service.  She has no range of motion precautions.  We will continue to follow through at the hospital.  Anticipate follow up in the office in 2 weeks for removal of sutures and further evaluation.     Homero Luster. Guyann Leitz, M.D.

## 2024-02-24 NOTE — Anesthesia Postprocedure Evaluation (Signed)
 Anesthesia Post Note  Patient: Frances Ortiz  Procedure(s) Performed: FIXATION, FRACTURE, INTERTROCHANTERIC, WITH INTRAMEDULLARY ROD (Left)     Patient location during evaluation: PACU Anesthesia Type: General Level of consciousness: awake and alert Pain management: pain level controlled Vital Signs Assessment: post-procedure vital signs reviewed and stable Respiratory status: spontaneous breathing, nonlabored ventilation, respiratory function stable and patient connected to nasal cannula oxygen Cardiovascular status: blood pressure returned to baseline and stable Postop Assessment: no apparent nausea or vomiting Anesthetic complications: no  No notable events documented.  Last Vitals:  Vitals:   02/24/24 1315 02/24/24 1332  BP: (!) 140/65 125/61  Pulse: 93 99  Resp: 12 12  Temp: 36.6 C   SpO2: 98% 93%    Last Pain:  Vitals:   02/24/24 1332  TempSrc: Oral  PainSc: 5                  Willian Harrow

## 2024-02-24 NOTE — Consult Note (Signed)
 Reason for Consult:Left hip fx Referring Physician: Aisha Hove Time called: 0730 Time at bedside: 0822   Frances Ortiz is an 89 y.o. female.  HPI: Frances Ortiz was trying to open her freezer when she lost the grip on the handle and fell. She had immediate pain in the left hip and could not get up. She was brought to the ED where x-rays showed a hip fx and orthopedic surgery was consulted. She lives at home alone and uses a RW to ambulate.  Past Medical History:  Diagnosis Date   Anxiety    GERD (gastroesophageal reflux disease)    Heart murmur    HLD (hyperlipidemia)    Intestinal obstruction (HCC)    Osteoporosis    PONV (postoperative nausea and vomiting)    Skin cancer    melanoma, basal cell, and squamous    Past Surgical History:  Procedure Laterality Date   ANTERIOR APPROACH HEMI HIP ARTHROPLASTY Right 06/22/2023   Procedure: POSTERIOR APPROACH HEMI HIP ARTHROPLASTY;  Surgeon: Osa Blase, MD;  Location: [facility] OR;  Service: Orthopedics;  Laterality: Right;   BIOPSY  07/14/2023   Procedure: BIOPSY;  Surgeon: Felecia Hopper, MD;  Location: [facility];  Service: Gastroenterology;;   BREAST BIOPSY     bilateral   BREAST EXCISIONAL BIOPSY Right 30 years ago   fibroadenoma   BREAST EXCISIONAL BIOPSY Left 40 years ago   sclerosis thickening   CATARACT EXTRACTION W/ INTRAOCULAR LENS  IMPLANT, BILATERAL Bilateral    CESAREAN SECTION     x 2   CHOLECYSTECTOMY N/A 11/27/2012   Procedure: LAPAROSCOPIC CHOLECYSTECTOMY WITH INTRAOPERATIVE CHOLANGIOGRAM;  Surgeon: Mayme Spearman, MD;  Location: [facility] OR;  Service: General;  Laterality: N/A;   COLECTOMY Right 10/07/2017   right   COLONOSCOPY  2005   COLONOSCOPY  08/2017   "found mass in cecum"   DILATION AND CURETTAGE OF UTERUS     ESOPHAGOGASTRODUODENOSCOPY     ESOPHAGOGASTRODUODENOSCOPY (EGD) WITH PROPOFOL  N/A 07/14/2023   Procedure: ESOPHAGOGASTRODUODENOSCOPY (EGD) WITH PROPOFOL ;  Surgeon: Felecia Hopper, MD;   Location: [facility];  Service: Gastroenterology;  Laterality: N/A;   IR KYPHO EA ADDL LEVEL THORACIC OR LUMBAR  02/27/2023   IR KYPHO LUMBAR INC FX REDUCE BONE BX UNI/BIL CANNULATION INC/IMAGING  02/27/2023   LAPAROSCOPIC RIGHT COLECTOMY Right 10/07/2017   Procedure: LAPAROSCOPIC RIGHT COLECTOMY ERAS PATHWAY;  Surgeon: Caralyn Chandler, MD;  Location: [facility];  Service: General;  Laterality: Right;   SKIN CANCER EXCISION     TONSILLECTOMY      Family History  Problem Relation Age of Onset   Pneumonia Mother    Colon cancer Neg Hx    Colon polyps Neg Hx    Rectal cancer Neg Hx    Stomach cancer Neg Hx     Social History:  reports that she has never smoked. She has never used smokeless tobacco. She reports that she does not drink alcohol and does not use drugs.  Allergies:  Allergies  Allergen Reactions   Aristocort  [Triamcinolone ] Other (See Comments)    Emotional instability   Cortisone Other (See Comments)    Emotional instability   Erythromycin Other (See Comments)    Stomach pain    Other Other (See Comments)    Epinephrine  in novocain makes heart race.   Hydrocodone -Acetaminophen  Rash    Medications: I have reviewed the patient's current medications.  Results for orders placed or performed during the hospital encounter of 02/23/24 (from the past 48 hours)  Basic metabolic  panel     Status: Abnormal   Collection Time: 02/23/24  5:06 PM  Result Value Ref Range   Sodium 136 135 - 145 mmol/L   Potassium 4.0 3.5 - 5.1 mmol/L   Chloride 100 98 - 111 mmol/L   CO2 23 22 - 32 mmol/L   Glucose, Bld 120 (H) 70 - 99 mg/dL    Comment: Glucose reference range applies only to samples taken after fasting for at least 8 hours.   BUN 20 8 - 23 mg/dL   Creatinine, Ser 1.61 0.44 - 1.00 mg/dL   Calcium 9.0 8.9 - 09.6 mg/dL   GFR, Estimated >04 >54 mL/min    Comment: (NOTE) Calculated using the CKD-EPI Creatinine Equation (2021)    Anion gap 13 5 - 15    Comment: Performed at [facility], [address], [place], [place] [address]  CBC with Differential     Status: Abnormal   Collection Time: 02/23/24  5:06 PM  Result Value Ref Range   WBC 14.7 (H) 4.0 - 10.5 K/uL   RBC 4.35 3.87 - 5.11 MIL/uL   Hemoglobin 13.7 12.0 - 15.0 g/dL   HCT 91.4 78.2 - 95.6 %   MCV 94.0 80.0 - 100.0 fL   MCH 31.5 26.0 - 34.0 pg   MCHC 33.5 30.0 - 36.0 g/dL   RDW 21.3 08.6 - 57.8 %   Platelets 352 150 - 400 K/uL   nRBC 0.0 0.0 - 0.2 %   Neutrophils Relative % 83 %   Neutro Abs 12.2 (H) 1.7 - 7.7 K/uL   Lymphocytes Relative 11 %   Lymphs Abs 1.7 0.7 - 4.0 K/uL   Monocytes Relative 4 %   Monocytes Absolute 0.6 0.1 - 1.0 K/uL   Eosinophils Relative 1 %   Eosinophils Absolute 0.1 0.0 - 0.5 K/uL   Basophils Relative 0 %   Basophils Absolute 0.1 0.0 - 0.1 K/uL   Immature Granulocytes 1 %   Abs Immature Granulocytes 0.09 (H) 0.00 - 0.07 K/uL    Comment: Performed at [facility], [address], [place], [place] [address]  Protime-INR     Status: None   Collection Time: 02/23/24  5:06 PM  Result Value Ref Range   Prothrombin Time 14.2 11.4 - 15.2 seconds   INR 1.1 0.8 - 1.2    Comment: (NOTE) INR goal varies based on device and disease states. Performed at [facility], [address] [place], [place] [address]   Type and screen [facility]     Status: None   Collection Time: 02/23/24  5:24 PM  Result Value Ref Range   ABO/RH(D) O POS    Antibody Screen NEG    Sample Expiration      02/26/2024,2359 Performed at [facility], [address] [place], [place] [address]   Surgical pcr screen     Status: None   Collection Time: 02/23/24 11:19 PM   Specimen: Nasal Mucosa; Nasal Swab  Result Value Ref Range   MRSA, PCR NEGATIVE NEGATIVE   Staphylococcus aureus NEGATIVE NEGATIVE    Comment: (NOTE) The Xpert SA Assay (FDA approved for NASAL specimens in patients 50 years of age and older), is one component of a  comprehensive surveillance program. It is not intended to diagnose infection nor to guide or monitor treatment. Performed at [facility], [address], [place], [place] [address]   CBC  Status: Abnormal   Collection Time: 02/24/24  6:32 AM  Result Value Ref Range   WBC 12.5 (H) 4.0 - 10.5 K/uL   RBC 3.98 3.87 - 5.11 MIL/uL   Hemoglobin 12.4 12.0 - 15.0 g/dL   HCT 16.1 09.6 - 04.5 %   MCV 94.2 80.0 - 100.0 fL   MCH 31.2 26.0 - 34.0 pg   MCHC 33.1 30.0 - 36.0 g/dL   RDW 40.9 81.1 - 91.4 %   Platelets 293 150 - 400 K/uL   nRBC 0.0 0.0 - 0.2 %    Comment: Performed at [facility], [address], [place], [place] [address]  Basic metabolic panel     Status: Abnormal   Collection Time: 02/24/24  6:32 AM  Result Value Ref Range   Sodium 135 135 - 145 mmol/L   Potassium 3.7 3.5 - 5.1 mmol/L   Chloride 101 98 - 111 mmol/L   CO2 25 22 - 32 mmol/L   Glucose, Bld 132 (H) 70 - 99 mg/dL    Comment: Glucose reference range applies only to samples taken after fasting for at least 8 hours.   BUN 12 8 - 23 mg/dL   Creatinine, Ser 6.21 0.44 - 1.00 mg/dL   Calcium 8.6 (L) 8.9 - 10.3 mg/dL   GFR, Estimated >30 >86 mL/min    Comment: (NOTE) Calculated using the CKD-EPI Creatinine Equation (2021)    Anion gap 9 5 - 15    Comment: Performed at [facility], [address], [place], [place] [address]    DG Shoulder Left Portable Result Date: 02/23/2024 CLINICAL DATA:  Marvell Slider.  Left shoulder pain EXAM: LEFT SHOULDER COMPARISON:  None Available. FINDINGS: There is a nondisplaced fracture of the distal clavicle. The [facility] joint is intact. The glenohumeral joint is maintained. No definite left-sided rib fractures or pneumothorax. IMPRESSION: Nondisplaced distal left clavicle fracture. Electronically Signed   By: Marrian Siva M.D.   On: 02/23/2024 19:24   DG Pelvis Portable Result Date: 02/23/2024 CLINICAL DATA:  Marvell Slider.  Left hip pain. EXAM: PORTABLE PELVIS 1-2 VIEWS COMPARISON:   None Available. FINDINGS: Displaced intertrochanteric fracture of the left hip with significant varus deformity. The pubic symphysis and SI joints are intact. No pelvic fractures. Right hip prosthesis is intact. IMPRESSION: Displaced intertrochanteric fracture of the left hip. Electronically Signed   By: Marrian Siva M.D.   On: 02/23/2024 19:23   DG FEMUR MIN 2 VIEWS LEFT Result Date: 02/23/2024 CLINICAL DATA:  Marvell Slider.  Left leg pain. EXAM: LEFT FEMUR 2 VIEWS COMPARISON:  None Available. FINDINGS: Displaced intertrochanteric fracture of the left hip with significant varus deformity. The femoral shaft is intact. No left-sided pelvic fractures are identified. IMPRESSION: Displaced intertrochanteric fracture of the left hip. Electronically Signed   By: Marrian Siva M.D.   On: 02/23/2024 19:23   CT HEAD WO CONTRAST Result Date: 02/23/2024 CLINICAL DATA:  Marvell Slider. EXAM: CT HEAD WITHOUT CONTRAST CT CERVICAL SPINE WITHOUT CONTRAST TECHNIQUE: Multidetector CT imaging of the head and cervical spine was performed following the standard protocol without intravenous contrast. Multiplanar CT image reconstructions of the cervical spine were also generated. RADIATION DOSE REDUCTION: This exam was performed according to the departmental dose-optimization program which includes automated exposure control, adjustment of the mA and/or kV according to patient size and/or use of iterative reconstruction technique. COMPARISON:  Cervical spine CT scan from 06/21/2023 FINDINGS: CT HEAD FINDINGS Brain: Age related cerebral atrophy, ventriculomegaly and periventricular white matter disease. No  extra-axial fluid collections are identified. No CT findings for acute hemispheric infarction or intracranial hemorrhage. No mass lesions. The brainstem and cerebellum are normal. Vascular: Vascular calcifications but no aneurysm or hyperdense vessels. Skull: Normal. Negative for fracture or focal lesion. Sinuses/Orbits: The paranasal sinuses and  mastoid air cells are clear except for chronic sinusitis involving the right half of the sphenoid sinus. The globes are intact. Other: No scalp lesions or scalp hematoma. CT CERVICAL SPINE FINDINGS Alignment: Normal. Skull base and vertebrae: No acute fracture. No primary bone lesion or focal pathologic process. Stable sclerotic lesion involving the right aspect of C1 consistent with a benign bone island. Soft tissues and spinal canal: No prevertebral fluid or swelling. No visible canal hematoma. Disc levels: The spinal canal is quite generous. No significant canal stenosis. Advanced multilevel facet disease, unchanged. Mild multilevel bony foraminal narrowing. Upper chest: The lung apices are grossly clear. Other: Carotid artery calcifications are noted. No neck mass or hematoma. IMPRESSION: 1. Age related cerebral atrophy, ventriculomegaly and periventricular white matter disease. 2. No acute intracranial findings or skull fracture. 3. Chronic right half sphenoid sinusitis. 4. Normal alignment of the cervical vertebral bodies and no acute fracture. 5. Advanced multilevel facet disease. Electronically Signed   By: Marrian Siva M.D.   On: 02/23/2024 19:22   CT CERVICAL SPINE WO CONTRAST Result Date: 02/23/2024 CLINICAL DATA:  Marvell Slider. EXAM: CT HEAD WITHOUT CONTRAST CT CERVICAL SPINE WITHOUT CONTRAST TECHNIQUE: Multidetector CT imaging of the head and cervical spine was performed following the standard protocol without intravenous contrast. Multiplanar CT image reconstructions of the cervical spine were also generated. RADIATION DOSE REDUCTION: This exam was performed according to the departmental dose-optimization program which includes automated exposure control, adjustment of the mA and/or kV according to patient size and/or use of iterative reconstruction technique. COMPARISON:  Cervical spine CT scan from 06/21/2023 FINDINGS: CT HEAD FINDINGS Brain: Age related cerebral atrophy, ventriculomegaly and  periventricular white matter disease. No extra-axial fluid collections are identified. No CT findings for acute hemispheric infarction or intracranial hemorrhage. No mass lesions. The brainstem and cerebellum are normal. Vascular: Vascular calcifications but no aneurysm or hyperdense vessels. Skull: Normal. Negative for fracture or focal lesion. Sinuses/Orbits: The paranasal sinuses and mastoid air cells are clear except for chronic sinusitis involving the right half of the sphenoid sinus. The globes are intact. Other: No scalp lesions or scalp hematoma. CT CERVICAL SPINE FINDINGS Alignment: Normal. Skull base and vertebrae: No acute fracture. No primary bone lesion or focal pathologic process. Stable sclerotic lesion involving the right aspect of C1 consistent with a benign bone island. Soft tissues and spinal canal: No prevertebral fluid or swelling. No visible canal hematoma. Disc levels: The spinal canal is quite generous. No significant canal stenosis. Advanced multilevel facet disease, unchanged. Mild multilevel bony foraminal narrowing. Upper chest: The lung apices are grossly clear. Other: Carotid artery calcifications are noted. No neck mass or hematoma. IMPRESSION: 1. Age related cerebral atrophy, ventriculomegaly and periventricular white matter disease. 2. No acute intracranial findings or skull fracture. 3. Chronic right half sphenoid sinusitis. 4. Normal alignment of the cervical vertebral bodies and no acute fracture. 5. Advanced multilevel facet disease. Electronically Signed   By: Marrian Siva M.D.   On: 02/23/2024 19:22   DG Chest Port 1 View Result Date: 02/23/2024 CLINICAL DATA:  Follow-up. EXAM: PORTABLE CHEST 1 VIEW COMPARISON:  09/02/2023 FINDINGS: The cardiac silhouette, mediastinal and hilar contours are within normal limits. No acute pulmonary process. No infiltrates, edema, effusions  or pneumothorax. The bony thorax is intact. No definite rib fractures. IMPRESSION: No acute  cardiopulmonary findings. Electronically Signed   By: Marrian Siva M.D.   On: 02/23/2024 19:16    Review of Systems  HENT:  Negative for ear discharge, ear pain, hearing loss and tinnitus.   Eyes:  Negative for photophobia and pain.  Respiratory:  Negative for cough and shortness of breath.   Cardiovascular:  Negative for chest pain.  Gastrointestinal:  Negative for abdominal pain, nausea and vomiting.  Genitourinary:  Negative for dysuria, flank pain, frequency and urgency.  Musculoskeletal:  Positive for arthralgias (Left hip). Negative for back pain, myalgias and neck pain.  Neurological:  Negative for dizziness and headaches.  Hematological:  Does not bruise/bleed easily.  Psychiatric/Behavioral:  The patient is not nervous/anxious.    Blood pressure (!) 155/82, pulse 100, temperature 98.4 F (36.9 C), temperature source Oral, resp. rate 19, height 4\' 10"  (1.473 m), weight 54 kg, SpO2 99%. Physical Exam Constitutional:      General: She is not in acute distress.    Appearance: She is well-developed. She is not diaphoretic.  HENT:     Head: Normocephalic and atraumatic.  Eyes:     General: No scleral icterus.       Right eye: No discharge.        Left eye: No discharge.     Conjunctiva/sclera: Conjunctivae normal.  Cardiovascular:     Rate and Rhythm: Normal rate and regular rhythm.  Pulmonary:     Effort: Pulmonary effort is normal. No respiratory distress.  Musculoskeletal:     Cervical back: Normal range of motion.     Comments: LLE No traumatic wounds, ecchymosis, or rash  Severe TTP hip/knee  No knee or ankle effusion  Knee stable to varus/ valgus and anterior/posterior stress  Sens DPN, SPN, TN intact  Motor EHL, ext, flex, evers 5/5  DP 1+, PT 1+, No significant edema  Skin:    General: Skin is warm and dry.  Neurological:     Mental Status: She is alert.  Psychiatric:        Mood and Affect: Mood normal.        Behavior: Behavior normal.      Assessment/Plan: Left hip fx -- Plan IMN today with Dr. Guyann Leitz. Please keep NPO. Multiple medical problems including hx/o cecal ca in remission, GERD, and HLD -- per primary service    Georganna Kin, PA-C Orthopedic Surgery [phone] 02/24/2024, 8:44 AM

## 2024-02-24 NOTE — Progress Notes (Signed)
 Initial Nutrition Assessment  DOCUMENTATION CODES:   Not applicable  INTERVENTION:   - Recommend Regular diet (not Heart Healthy diet) once diet advanced post-op  - Ensure Enlive/Ensure Plus High Protein po BID, each supplement provides 350 kcal and 20 grams of protein  - MVI with minerals daily  NUTRITION DIAGNOSIS:   Increased nutrient needs related to post-op healing, hip fracture as evidenced by estimated needs.  GOAL:   Patient will meet greater than or equal to 90% of their needs  MONITOR:   PO intake, Supplement acceptance, Weight trends  REASON FOR ASSESSMENT:   Consult Hip fracture protocol  ASSESSMENT:   89 year old female who presented to the ED with hip pain after a fall. PMH of GERD, HLD, prior R hip replacement, cecal cancer s/p resection. Pt admitted with L distal clavicle fracture, L hip fracture.  02/23/24 - Heart Healthy diet 02/24/24 - NPO  Pt currently NPO for IMN today with Orthopedics. Unable to speak with pt at bedside. Pt currently in OR.  Reviewed weight history of chart. Weight on admission of 54 kg (119 lbs) appears to be stated rather than measured. If accurate, pt has experienced a 7 kg (11.5%) weight loss  since 06/22/23 (8 months) which is not clinically significant for timeframe but is concerning.  Recommend Regular diet once diet advanced post-op. Pt with increased nutrient needs related to hip fracture and post-op healing. RD to order Ensure Enlive/Ensure Plus High Protein BID and MVI with minerals daily.  Meal Completion: none documented  Medications reviewed. IVF: NS @ 75 mL/hr  Labs reviewed: WBC 12.5  NUTRITION - FOCUSED PHYSICAL EXAM:  Unable to complete. Pt currently in the OR.  Diet Order:   Diet Order             Diet NPO time specified Except for: Sips with Meds, Ice Chips  Diet effective now                   EDUCATION NEEDS:   Not appropriate for education at this time  Skin:  Skin Assessment: Reviewed RN  Assessment  Last BM:  02/22/24  Height:   Ht Readings from Last 1 Encounters:  02/24/24 4\' 10"  (1.473 m)    Weight:   Wt Readings from Last 1 Encounters:  02/24/24 54 kg    BMI:  Body mass index is 24.87 kg/m.  Estimated Nutritional Needs:   Kcal:  1400-1600  Protein:  65-80 grams  Fluid:  1.4-1.6    Ernestina Headland, MS, RD, LDN Registered Dietitian II Please see AMiON for contact information.

## 2024-02-24 NOTE — Progress Notes (Signed)
 Progress Note   Patient: Frances Ortiz NGE:[id number] DOB: 1931/08/11 DOA: 02/23/2024     1 DOS: the patient was seen and examined on 02/24/2024   Brief hospital course: Frances Ortiz is a 89 y.o. female with history of cecal cancer status post resection and in remission had a fall at home while trying to open the refrigerator.  Patient had complained of left hip pain since the fall.  Denies losing consciousness.  Had some skin tear on the extremities after the fall. CT head C-spine and x-rays which revealed left hip and left clavicle fracture.  On-call orthopedic surgeon Dr. Bernard Brick was consulted and patient is being admitted to [facility] service for orthopedic surgical intervention plan.    Assessment and Plan: Left Hip and left clavicle fracture Orthopedic surgery evaluation appreciated. S/p Intra medullary nail proximal femur fracture. She tolerated procedure well. No swelling, tenderness over surgical site. Check H/H tomorrow and transfuse if Hb less than 7. Continue pain control. PT/ OT eval per orthopedic recommendations.  Elevated BP without h/o hypertension- Possibly in the setting of fracture pain. BP better today. Continue pain control. IV Labetolol for high BP ordered.  Leukocytosis- reactive. Trend CBC.  H/o cecal cancer-  S/p resection in remission. No active issue.      Out of bed to chair. Incentive spirometry. Nursing supportive care. Fall, aspiration precautions. Diet:  Diet Orders (From admission, onward)     Start     Ordered   02/24/24 1248  Diet regular Room service appropriate? Yes; Fluid consistency: Thin  Diet effective now       Question Answer Comment  Room service appropriate? Yes   Fluid consistency: Thin      02/24/24 1247           DVT prophylaxis: enoxaparin  (LOVENOX ) injection 40 mg Start: 02/25/24 0800 SCDs Start: 02/24/24 1338 SCDs Start: 02/23/24 2252  Level of care: Med-Surg   Code Status: Limited: Do not attempt resuscitation  (DNR) -DNR-LIMITED -Do Not Intubate/DNI   Subjective: Patient is seen and examined today after she came from OR. She is slow but able to answer me. Denies pain. Eating poor, poor appetite since fall.  Physical Exam: Vitals:   02/24/24 1248 02/24/24 1300 02/24/24 1315 02/24/24 1332  BP: (!) 144/70 (!) 145/75 (!) 140/65 125/61  Pulse:  94 93 99  Resp:  12 12 12   Temp: (!) 97.5 F (36.4 C)  97.8 F (36.6 C)   TempSrc:    Oral  SpO2:  90% 98% 93%  Weight:      Height:        General - Elderly Caucasian female, slow mentation, no apparent distress HEENT - PERRLA, EOMI, atraumatic head, non tender sinuses. Lung - Clear, basal rhonchi, no wheezes. Heart - S1, S2 heard, no murmurs, rubs, trace pedal edema. Abdomen - Soft, non tender, bowel sounds good Neuro - Alert, awake and oriented, able to answer appropriately, non focal exam. Skin - Warm and dry.  Data Reviewed:      Latest Ref Rng & Units 02/24/2024    6:32 AM 02/23/2024    5:06 PM 09/13/2023    5:19 PM  CBC  WBC 4.0 - 10.5 K/uL 12.5  14.7  7.4   Hemoglobin 12.0 - 15.0 g/dL 40.1  02.7  25.3   Hematocrit 36.0 - 46.0 % 37.5  40.9  38.1   Platelets 150 - 400 K/uL 293  352  421       Latest Ref Rng &  Units 02/24/2024    6:32 AM 02/23/2024    5:06 PM 09/13/2023    5:19 PM  BMP  Glucose 70 - 99 mg/dL 161  096  95   BUN 8 - 23 mg/dL 12  20  27    Creatinine 0.44 - 1.00 mg/dL 0.45  4.09  8.11   Sodium 135 - 145 mmol/L 135  136  135   Potassium 3.5 - 5.1 mmol/L 3.7  4.0  3.9   Chloride 98 - 111 mmol/L 101  100  98   CO2 22 - 32 mmol/L 25  23  27    Calcium 8.9 - 10.3 mg/dL 8.6  9.0  9.1    DG FEMUR PORT MIN 2 VIEWS LEFT Result Date: 02/24/2024 CLINICAL DATA:  Fracture.  Status post intramedullary nail. EXAM: LEFT FEMUR PORTABLE 2 VIEWS COMPARISON:  Left femoral radiographs 02/23/2024 FINDINGS: There is diffuse decreased bone mineralization. Interval cephalo medullary nail fixation of the previously seen proximal left femoral  intertrochanteric fracture including two trans trochanteric screws. Improved alignment. Persistent fracture line lucency. Mild left femoroacetabular joint space narrowing. Moderate medial and patellofemoral knee compartment joint space narrowing with moderate superior patellar degenerative spurring. No joint effusion. Expected lateral left hip subcutaneous air. IMPRESSION: Interval cephalomedullary nail fixation of the previously seen proximal left femoral intertrochanteric fracture with improved alignment. Electronically Signed   By: Bertina Broccoli M.D.   On: 02/24/2024 14:38   DG HIP UNILAT WITH PELVIS 2-3 VIEWS LEFT Result Date: 02/24/2024 CLINICAL DATA:  Elective surgery. EXAM: DG HIP (WITH OR WITHOUT PELVIS) 2-3V LEFT COMPARISON:  Preoperative imaging FINDINGS: Eight fluoroscopic spot views of the left hip submitted from the operating room. Femoral intramedullary nail with trans trochanteric and distal locking screw fixation traverse proximal femur fracture. Fluoroscopy time 46 seconds. Dose 8.96 mGy. IMPRESSION: Intraoperative fluoroscopy during proximal femur fracture fixation. Electronically Signed   By: Chadwick Colonel M.D.   On: 02/24/2024 12:36   DG C-Arm 1-60 Min-No Report Result Date: 02/24/2024 Fluoroscopy was utilized by the requesting physician.  No radiographic interpretation.   DG Shoulder Left Portable Result Date: 02/23/2024 CLINICAL DATA:  Marvell Slider.  Left shoulder pain EXAM: LEFT SHOULDER COMPARISON:  None Available. FINDINGS: There is a nondisplaced fracture of the distal clavicle. The [facility] joint is intact. The glenohumeral joint is maintained. No definite left-sided rib fractures or pneumothorax. IMPRESSION: Nondisplaced distal left clavicle fracture. Electronically Signed   By: Marrian Siva M.D.   On: 02/23/2024 19:24   DG Pelvis Portable Result Date: 02/23/2024 CLINICAL DATA:  Marvell Slider.  Left hip pain. EXAM: PORTABLE PELVIS 1-2 VIEWS COMPARISON:  None Available. FINDINGS: Displaced  intertrochanteric fracture of the left hip with significant varus deformity. The pubic symphysis and SI joints are intact. No pelvic fractures. Right hip prosthesis is intact. IMPRESSION: Displaced intertrochanteric fracture of the left hip. Electronically Signed   By: Marrian Siva M.D.   On: 02/23/2024 19:23   DG FEMUR MIN 2 VIEWS LEFT Result Date: 02/23/2024 CLINICAL DATA:  Marvell Slider.  Left leg pain. EXAM: LEFT FEMUR 2 VIEWS COMPARISON:  None Available. FINDINGS: Displaced intertrochanteric fracture of the left hip with significant varus deformity. The femoral shaft is intact. No left-sided pelvic fractures are identified. IMPRESSION: Displaced intertrochanteric fracture of the left hip. Electronically Signed   By: Marrian Siva M.D.   On: 02/23/2024 19:23   CT HEAD WO CONTRAST Result Date: 02/23/2024 CLINICAL DATA:  Marvell Slider. EXAM: CT HEAD WITHOUT CONTRAST CT CERVICAL SPINE WITHOUT CONTRAST TECHNIQUE: Multidetector CT  imaging of the head and cervical spine was performed following the standard protocol without intravenous contrast. Multiplanar CT image reconstructions of the cervical spine were also generated. RADIATION DOSE REDUCTION: This exam was performed according to the departmental dose-optimization program which includes automated exposure control, adjustment of the mA and/or kV according to patient size and/or use of iterative reconstruction technique. COMPARISON:  Cervical spine CT scan from 06/21/2023 FINDINGS: CT HEAD FINDINGS Brain: Age related cerebral atrophy, ventriculomegaly and periventricular white matter disease. No extra-axial fluid collections are identified. No CT findings for acute hemispheric infarction or intracranial hemorrhage. No mass lesions. The brainstem and cerebellum are normal. Vascular: Vascular calcifications but no aneurysm or hyperdense vessels. Skull: Normal. Negative for fracture or focal lesion. Sinuses/Orbits: The paranasal sinuses and mastoid air cells are clear except for  chronic sinusitis involving the right half of the sphenoid sinus. The globes are intact. Other: No scalp lesions or scalp hematoma. CT CERVICAL SPINE FINDINGS Alignment: Normal. Skull base and vertebrae: No acute fracture. No primary bone lesion or focal pathologic process. Stable sclerotic lesion involving the right aspect of C1 consistent with a benign bone island. Soft tissues and spinal canal: No prevertebral fluid or swelling. No visible canal hematoma. Disc levels: The spinal canal is quite generous. No significant canal stenosis. Advanced multilevel facet disease, unchanged. Mild multilevel bony foraminal narrowing. Upper chest: The lung apices are grossly clear. Other: Carotid artery calcifications are noted. No neck mass or hematoma. IMPRESSION: 1. Age related cerebral atrophy, ventriculomegaly and periventricular white matter disease. 2. No acute intracranial findings or skull fracture. 3. Chronic right half sphenoid sinusitis. 4. Normal alignment of the cervical vertebral bodies and no acute fracture. 5. Advanced multilevel facet disease. Electronically Signed   By: Marrian Siva M.D.   On: 02/23/2024 19:22   CT CERVICAL SPINE WO CONTRAST Result Date: 02/23/2024 CLINICAL DATA:  Marvell Slider. EXAM: CT HEAD WITHOUT CONTRAST CT CERVICAL SPINE WITHOUT CONTRAST TECHNIQUE: Multidetector CT imaging of the head and cervical spine was performed following the standard protocol without intravenous contrast. Multiplanar CT image reconstructions of the cervical spine were also generated. RADIATION DOSE REDUCTION: This exam was performed according to the departmental dose-optimization program which includes automated exposure control, adjustment of the mA and/or kV according to patient size and/or use of iterative reconstruction technique. COMPARISON:  Cervical spine CT scan from 06/21/2023 FINDINGS: CT HEAD FINDINGS Brain: Age related cerebral atrophy, ventriculomegaly and periventricular white matter disease. No  extra-axial fluid collections are identified. No CT findings for acute hemispheric infarction or intracranial hemorrhage. No mass lesions. The brainstem and cerebellum are normal. Vascular: Vascular calcifications but no aneurysm or hyperdense vessels. Skull: Normal. Negative for fracture or focal lesion. Sinuses/Orbits: The paranasal sinuses and mastoid air cells are clear except for chronic sinusitis involving the right half of the sphenoid sinus. The globes are intact. Other: No scalp lesions or scalp hematoma. CT CERVICAL SPINE FINDINGS Alignment: Normal. Skull base and vertebrae: No acute fracture. No primary bone lesion or focal pathologic process. Stable sclerotic lesion involving the right aspect of C1 consistent with a benign bone island. Soft tissues and spinal canal: No prevertebral fluid or swelling. No visible canal hematoma. Disc levels: The spinal canal is quite generous. No significant canal stenosis. Advanced multilevel facet disease, unchanged. Mild multilevel bony foraminal narrowing. Upper chest: The lung apices are grossly clear. Other: Carotid artery calcifications are noted. No neck mass or hematoma. IMPRESSION: 1. Age related cerebral atrophy, ventriculomegaly and periventricular white matter disease. 2. No  acute intracranial findings or skull fracture. 3. Chronic right half sphenoid sinusitis. 4. Normal alignment of the cervical vertebral bodies and no acute fracture. 5. Advanced multilevel facet disease. Electronically Signed   By: Marrian Siva M.D.   On: 02/23/2024 19:22   DG Chest Port 1 View Result Date: 02/23/2024 CLINICAL DATA:  Follow-up. EXAM: PORTABLE CHEST 1 VIEW COMPARISON:  09/02/2023 FINDINGS: The cardiac silhouette, mediastinal and hilar contours are within normal limits. No acute pulmonary process. No infiltrates, edema, effusions or pneumothorax. The bony thorax is intact. No definite rib fractures. IMPRESSION: No acute cardiopulmonary findings. Electronically Signed   By:  Marrian Siva M.D.   On: 02/23/2024 19:16    Family Communication: Discussed with patient, family at bedside. They understand and agree. All questions answered.  Disposition: Status is: Inpatient Remains inpatient appropriate because: orthopedic surgery, PT/ OT eval.  Planned Discharge Destination: Rehab     Time spent: 39 minutes  Author: Aisha Hove, MD 02/24/2024 3:46 PM Secure chat 7am to 7pm For on call review [email or web address].

## 2024-02-25 ENCOUNTER — Encounter (HOSPITAL_COMMUNITY): Payer: Self-pay | Admitting: Orthopedic Surgery

## 2024-02-25 DIAGNOSIS — M80052A Age-related osteoporosis with current pathological fracture, left femur, initial encounter for fracture: Secondary | ICD-10-CM | POA: Diagnosis not present

## 2024-02-25 LAB — CBC
HCT: 31.8 % — ABNORMAL LOW (ref 36.0–46.0)
Hemoglobin: 10.7 g/dL — ABNORMAL LOW (ref 12.0–15.0)
MCH: 31.9 pg (ref 26.0–34.0)
MCHC: 33.6 g/dL (ref 30.0–36.0)
MCV: 94.9 fL (ref 80.0–100.0)
Platelets: 247 10*3/uL (ref 150–400)
RBC: 3.35 MIL/uL — ABNORMAL LOW (ref 3.87–5.11)
RDW: 14 % (ref 11.5–15.5)
WBC: 11.3 10*3/uL — ABNORMAL HIGH (ref 4.0–10.5)
nRBC: 0 % (ref 0.0–0.2)

## 2024-02-25 LAB — BASIC METABOLIC PANEL WITH GFR
Anion gap: 10 (ref 5–15)
BUN: 13 mg/dL (ref 8–23)
CO2: 23 mmol/L (ref 22–32)
Calcium: 8.4 mg/dL — ABNORMAL LOW (ref 8.9–10.3)
Chloride: 102 mmol/L (ref 98–111)
Creatinine, Ser: 0.48 mg/dL (ref 0.44–1.00)
GFR, Estimated: >60 mL/min (ref >60)
Glucose, Bld: 113 mg/dL — ABNORMAL HIGH (ref 70–99)
Potassium: 3.5 mmol/L (ref 3.5–5.1)
Sodium: 135 mmol/L (ref 135–145)

## 2024-02-25 LAB — VITAMIN D 25 HYDROXY (VIT D DEFICIENCY, FRACTURES): Vit D, 25-Hydroxy: 45.36 ng/mL (ref 30–100)

## 2024-02-25 NOTE — [place] FL2 (Signed)
   MEDICAID FL2 LEVEL OF CARE FORM     IDENTIFICATION  Patient Name: Frances Ortiz Birthdate: 01-18-1931 Sex: female Admission Date (Current Location): 02/23/2024  [facility] and [place] Number:  Producer, television/film/video and Address:  [facility], [address] [place], [place] [address]      Provider Number: [id number]  Attending Physician Name and Address:  Haydee Lipa, MD  Relative Name and Phone Number:  Shaffer,[name] POA/medical POA (Daughter)  [phone] (Mobile)    Current Level of Care: Hospital Recommended Level of Care: Skilled Nursing Facility Prior Approval Number:    Date Approved/Denied:   PASRR Number: [id number]  Discharge Plan: SNF    Current Diagnoses: Patient Active Problem List   Diagnosis Date Noted   Leucocytosis 02/24/2024   Clavicular fracture 02/23/2024   GI bleed 07/13/2023   Upper GI bleed 07/12/2023   Pulmonary nodule 07/12/2023   Essential hypertension 07/12/2023   Acute blood loss anemia 07/12/2023   Anxiety and depression 07/12/2023   Osteoporotic fracture of left hip, initial encounter (HCC) 06/21/2023   Sinus tachycardia 06/21/2023   Elevated blood pressure reading 06/21/2023   At risk for inadequate pain control 02/25/2023   Lumbar compression fracture (HCC) 02/25/2023   Lumbar spinal stenosis 02/25/2023   S/P right colectomy 02/25/2023   Inadequate pain control 02/25/2023   Cecal cancer (HCC) 10/07/2017   Cholelithiasis 11/13/2012    Orientation RESPIRATION BLADDER Height & Weight     Time, Self, Situation, Place  Normal Incontinent Weight: 119 lb (54 kg) Height:  4\' 10"  (147.3 cm)  BEHAVIORAL SYMPTOMS/MOOD NEUROLOGICAL BOWEL NUTRITION STATUS      Continent Diet (see d/c summary)  AMBULATORY STATUS COMMUNICATION OF NEEDS Skin   Extensive Assist Verbally Surgical wounds (incision left hip)                       Personal Care Assistance Level of Assistance  Bathing, Feeding,  Dressing Bathing Assistance: Maximum assistance Feeding assistance: Independent Dressing Assistance: Limited assistance     Functional Limitations Info  Sight, Speech, Hearing Sight Info: Adequate Hearing Info: Impaired Speech Info: Adequate    SPECIAL CARE FACTORS FREQUENCY  OT (By licensed OT), PT (By licensed PT)     PT Frequency: 5x/week OT Frequency: 5x/week            Contractures Contractures Info: Not present    Additional Factors Info  Code Status, Allergies Code Status Info: DNR Allergies Info: Aristocort  (triamcinolone ), cortisone, Erythromycin, Hydrocodone -acetaminophen            Current Medications (02/25/2024):  This is the current hospital active medication list Current Facility-Administered Medications  Medication Dose Route Frequency Provider Last Rate Last Admin   acetaminophen  (TYLENOL ) tablet 1,000 mg  1,000 mg Oral Q8H Marisela Sicks, PA-C   1,000 mg at 02/25/24 8413   docusate sodium  (COLACE) capsule 100 mg  100 mg Oral BID Marisela Sicks, PA-C   100 mg at 02/24/24 1404   enoxaparin  (LOVENOX ) injection 40 mg  40 mg Subcutaneous Q24H Marisela Sicks, PA-C   40 mg at 02/25/24 0827   feeding supplement (ENSURE ENLIVE / ENSURE PLUS) liquid 237 mL  237 mL Oral BID BM Marisela Sicks, PA-C   237 mL at 02/25/24 2440   HYDROmorphone  (DILAUDID ) injection 0.5 mg  0.5 mg Intravenous Q3H PRN Marisela Sicks, PA-C   0.5 mg at 02/25/24 1229   labetalol  (NORMODYNE ) injection 5 mg  5 mg Intravenous Q4H  PRN Marisela Sicks, PA-C   5 mg at 02/23/24 2309   menthol -cetylpyridinium (CEPACOL) lozenge 3 mg  1 lozenge Oral PRN Marisela Sicks, PA-C       Or   phenol (CHLORASEPTIC) mouth spray 1 spray  1 spray Mouth/Throat PRN Marisela Sicks, PA-C       metoCLOPramide (REGLAN) tablet 5-10 mg  5-10 mg Oral Q8H PRN Marisela Sicks, PA-C       Or   metoCLOPramide (REGLAN) injection 5-10 mg  5-10 mg Intravenous Q8H PRN Marisela Sicks, PA-C       multivitamin with minerals tablet 1 tablet  1 tablet Oral Daily  Marisela Sicks, PA-C   1 tablet at 02/25/24 4782   ondansetron  (ZOFRAN ) tablet 4 mg  4 mg Oral Q6H PRN Marisela Sicks, PA-C       Or   ondansetron  (ZOFRAN ) injection 4 mg  4 mg Intravenous Q6H PRN Marisela Sicks, PA-C   4 mg at 02/25/24 1226   oxyCODONE  (Oxy IR/ROXICODONE ) immediate release tablet 5 mg  5 mg Oral Q6H PRN Marisela Sicks, PA-C         Discharge Medications: Please see discharge summary for a list of discharge medications.  Relevant Imaging Results:  Relevant Lab Results:   Additional Information [id number]  Katrinka Parr, LCSW

## 2024-02-25 NOTE — TOC Initial Note (Signed)
 Transition of Care [facility]) - Initial/Assessment Note    Patient Details  Name: Frances Ortiz MRN: [id number] Date of Birth: 23-Jan-1931  Transition of [facility] CM/SW Contact:    Katrinka Parr, LCSW Phone Number: 02/25/2024, 2:34 PM  Clinical Narrative:                  CSW met with pt to discuss SNF recommendation. Pt agreeable to SNF. She has been to [facility] in the past but does not want to return there. She was at one that she liked but cannot remember the name. Fl2 completed and bed requests sent in hub. Pt consents to CSW updated daughter beth.   CSW spoke with [facility] who explained they strongly prefer [facility] where pt has been in the past. She does not want [facility] or [facility]. TOC wil lcontinue to follow.   Expected Discharge Plan: Skilled Nursing Facility Barriers to Discharge: Continued Medical Work up, English as a second language teacher, SNF Pending bed offer   Patient Goals and CMS Choice    SNF but doesn't want [facility]        Expected Discharge Plan and Services                                              Prior Living Arrangements/Services                       Activities of Daily Living      Permission Sought/Granted   Permission granted to share information with : Yes, Verbal Permission Granted              Emotional Assessment              Admission diagnosis:  Hip fracture (HCC) [S72.009A] Fall, initial encounter [W19.XXXA] Traumatic closed fracture of distal clavicle with minimal displacement, left, initial encounter [S42.032A] Displaced intertrochanteric fracture of left femur, initial encounter for closed fracture [facility]) [S72.142A] Patient Active Problem List   Diagnosis Date Noted   Leucocytosis 02/24/2024   Clavicular fracture 02/23/2024   GI bleed 07/13/2023   Upper GI bleed 07/12/2023   Pulmonary nodule 07/12/2023   Essential hypertension 07/12/2023   Acute blood loss anemia 07/12/2023   Anxiety and  depression 07/12/2023   Osteoporotic fracture of left hip, initial encounter (HCC) 06/21/2023   Sinus tachycardia 06/21/2023   Elevated blood pressure reading 06/21/2023   At risk for inadequate pain control 02/25/2023   Lumbar compression fracture (HCC) 02/25/2023   Lumbar spinal stenosis 02/25/2023   S/P right colectomy 02/25/2023   Inadequate pain control 02/25/2023   Cecal cancer (HCC) 10/07/2017   Cholelithiasis 11/13/2012   PCP:  Tita Form, MD Pharmacy:   [facility] [id number] - [place], [place] - [address] [place] [address] Phone: [phone] Fax: [phone]     Social Drivers of Health (SDOH) Social History: SDOH Screenings   Food Insecurity: No Food Insecurity (02/25/2024)  Housing: Low Risk  (02/25/2024)  Transportation Needs: No Transportation Needs (02/25/2024)  Utilities: Not At Risk (02/25/2024)  Social Connections: Moderately Isolated (02/25/2024)  Tobacco Use: Low Risk  (02/24/2024)   SDOH Interventions:     Readmission Risk Interventions     No data to display

## 2024-02-25 NOTE — Progress Notes (Signed)
 Occupational Therapy Treatment Patient Details Name: Frances Ortiz MRN: [id number] DOB: [birth year] Today's Date: 02/25/2024   History of present illness Pt is a 89 y.o. female admitted to [facility] via EMS for fall in home. CT showed L Intertrochanteric hip fx and L clavicle fx. Non-op management for L clavicle. IMN of L hip performed 5/5.   PMH: hyperlipidemia, anxiety/depression, cecal cancer s/p R colectomy 2018, GERD, osteoporosis.   OT comments  Pt seen at request of assistance from RN for chair to bed transfer. Pt in max pain on arrival and very fearful of mobility. Pt needing max redirection and step by step cues throughout session for both mobility and reducing fear/anxiety. Pt with fair initiation but ultimately max-total A for stand pivot back to bed and return to supine. Encouraged pt to perform composite flexion/extension and elbow flexion/extension of LUE to promote healing and reduce pain. Will continue to follow. Patient will benefit from continued inpatient follow up therapy, <3 hours/day       If plan is discharge home, recommend the following:  Two people to help with walking and/or transfers;Two people to help with bathing/dressing/bathroom;Supervision due to cognitive status;Assistance with feeding;Assistance with cooking/housework   Equipment Recommendations  Other (comment) (defer)    Recommendations for Other Services      Precautions / Restrictions Precautions Precautions: Fall Recall of Precautions/Restrictions: Impaired Required Braces or Orthoses: Sling (for comfort LUE) Restrictions Weight Bearing Restrictions Per Provider Order: Yes LUE Weight Bearing Per Provider Order: Weight bearing as tolerated LLE Weight Bearing Per Provider Order: Weight bearing as tolerated       Mobility Bed Mobility Overal bed mobility: Needs Assistance Bed Mobility: Sit to Supine       Sit to supine: Max assist, +2 for physical assistance, +2 for safety/equipment         Transfers Overall transfer level: Needs assistance Equipment used: 1 person hand held assist Transfers: Bed to chair/wheelchair/BSC   Stand pivot transfers: Max assist, Total assist         General transfer comment: Pt severely limited d/t pain, bracing in standing; poor tolerance of weightbearing     Balance Overall balance assessment: Needs assistance Sitting-balance support: Bilateral upper extremity supported, Feet supported Sitting balance-Leahy Scale: Fair Sitting balance - Comments: CGA statically Postural control: Right lateral lean Standing balance support: Bilateral upper extremity supported, During functional activity Standing balance-Leahy Scale: Zero Standing balance comment: Reliant on external support                           ADL either performed or assessed with clinical judgement   ADL Overall ADL's : Needs assistance/impaired                         Toilet Transfer: Maximal assistance;Stand-pivot Toilet Transfer Details (indicate cue type and reason): max A face to face stand pivot transfer back to bed with +1 A and RN present for +2/safety         Functional mobility during ADLs: Total assistance;+2 for physical assistance;+2 for safety/equipment General ADL Comments: Limited by pain    Extremity/Trunk Assessment Upper Extremity Assessment Upper Extremity Assessment: LUE deficits/detail LUE Deficits / Details: L clavicle fx, unable to achieve 50 degrees shoulder flex LUE Sensation: decreased light touch LUE Coordination: decreased fine motor;decreased gross motor   Lower Extremity Assessment Lower Extremity Assessment: Defer to PT evaluation        Vision  Vision Assessment?: No apparent visual deficits   Perception     Praxis     Communication Communication Communication: No apparent difficulties   Cognition Arousal: Alert Behavior During Therapy: Flat affect Cognition: Cognition impaired       Memory  impairment (select all impairments): Short-term memory   Executive functioning impairment (select all impairments): Organization, Sequencing, Problem solving                   Following commands: Impaired Following commands impaired: Follows one step commands with increased time      Cueing   Cueing Techniques: Verbal cues, Tactile cues  Exercises      Shoulder Instructions       General Comments      Pertinent Vitals/ Pain       Pain Assessment Pain Assessment: Faces Faces Pain Scale: Hurts whole lot Pain Location: LLE Pain Descriptors / Indicators: Aching, Discomfort, Guarding, Grimacing, Moaning Pain Intervention(s): Limited activity within patient's tolerance, Monitored during session  Home Living                                          Prior Functioning/Environment              Frequency  Min 2X/week        Progress Toward Goals  OT Goals(current goals can now be found in the care plan section)  Progress towards OT goals: Progressing toward goals  Acute Rehab OT Goals Patient Stated Goal: less pain OT Goal Formulation: With patient Time For Goal Achievement: 03/10/24 Potential to Achieve Goals: Good ADL Goals Pt Will Perform Grooming: with set-up;sitting (bil grooming task) Pt Will Perform Upper Body Dressing: with set-up;sitting Pt Will Transfer to Toilet: with mod assist;with +2 assist;stand pivot transfer;bedside commode Additional ADL Goal #1: Pt will complete bed mobility with min assist to aid in ADLs  Plan      Co-evaluation                 AM-PAC OT "6 Clicks" Daily Activity     Outcome Measure   Help from another person eating meals?: A Little Help from another person taking care of personal grooming?: A Little Help from another person toileting, which includes using toliet, bedpan, or urinal?: Total Help from another person bathing (including washing, rinsing, drying)?: A Lot Help from another  person to put on and taking off regular upper body clothing?: A Lot Help from another person to put on and taking off regular lower body clothing?: Total 6 Click Score: 12    End of Session Equipment Utilized During Treatment: Gait belt  OT Visit Diagnosis: Unsteadiness on feet (R26.81);Other abnormalities of gait and mobility (R26.89);Repeated falls (R29.6);Muscle weakness (generalized) (M62.81)   Activity Tolerance Patient limited by pain   Patient Left in chair;with call bell/phone within reach;with chair alarm set;with family/visitor present   Nurse Communication Mobility status        Time: 1550-1607 OT Time Calculation (min): 17 min  Charges: OT General Charges $OT Visit: 1 Visit OT Treatments $Therapeutic Activity: 8-22 mins  Karilyn Ouch, OTR/L [facility] Acute Rehabilitation Office: [phone]   Emery Hans 02/25/2024, 5:25 PM

## 2024-02-25 NOTE — TOC CAGE-AID Note (Signed)
 Transition of Care [facility]) - CAGE-AID Screening   Patient Details  Name: Frances Ortiz MRN: [id number] Date of Birth: [birth year]  [facility] CM/SW Contact:    Anaira Seay E Jimmey Hengel, LCSW Phone Number: 02/25/2024, 10:37 AM   Clinical Narrative: Denied substance use.   CAGE-AID Screening:    Have You Ever Felt You Ought to Cut Down on Your Drinking or Drug Use?: No Have People Annoyed You By Critizing Your Drinking Or Drug Use?: No Have You Felt Bad Or Guilty About Your Drinking Or Drug Use?: No Have You Ever Had a Drink or Used Drugs First Thing In The Morning to Steady Your Nerves or to Get Rid of a Hangover?: No CAGE-AID Score: 0  Substance Abuse Education Offered: No

## 2024-02-25 NOTE — Evaluation (Signed)
 Physical Therapy Evaluation Patient Details Name: Frances Ortiz MRN: [id number] DOB: 03/09/1931 Today's Date: 02/25/2024  History of Present Illness  Pt is a 89 y.o. female admitted to [facility] via EMS for fall in home. CT showed L Intertrochanteric hip fx and L clavicle fx. Non-op management for L clavicle. IMN of L hip performed 5/5.   PMH: hyperlipidemia, anxiety/depression, cecal cancer s/p R colectomy 2018, GERD, osteoporosis.  Clinical Impression   Pt admitted with above diagnosis. Lives at home, has caregivers; Prior to admission, pt was able to walk household distances with a RW and no physical assist; Presents to PT with a significant decline hfrom her functional baseline, incr pain, funcitonal dependencies; needs 2 person Max assist to sit up to EOB, and perform basice pivot bed to recliner on her R side; pt is familiar with post acute rehab and agreeable to SNF (prefers [facility]);  Pt currently with functional limitations due to the deficits listed below (see PT Problem List). Pt will benefit from skilled PT to increase their independence and safety with mobility to allow discharge to the venue listed below.           If plan is discharge home, recommend the following: Two people to help with walking and/or transfers;Two people to help with bathing/dressing/bathroom   Can travel by private vehicle   No    Equipment Recommendations None recommended by PT  Recommendations for Other Services       Functional Status Assessment Patient has had a recent decline in their functional status and demonstrates the ability to make significant improvements in function in a reasonable and predictable amount of time.     Precautions / Restrictions Precautions Precautions: Fall Recall of Precautions/Restrictions: Impaired Required Braces or Orthoses: Sling (For comfort LUE) Restrictions Weight Bearing Restrictions Per Provider Order: Yes LUE Weight Bearing Per Provider Order: Weight bearing as  tolerated LLE Weight Bearing Per Provider Order: Weight bearing as tolerated      Mobility  Bed Mobility Overal bed mobility: Needs Assistance Bed Mobility: Supine to Sit     Supine to sit: Max assist, +2 for physical assistance, +2 for safety/equipment, HOB elevated, Used rails     General bed mobility comments: use of rail with RUE, min to CGA to maintain sitting EOB    Transfers Overall transfer level: Needs assistance Equipment used: 2 person hand held assist Transfers: Bed to chair/wheelchair/BSC   Stand pivot transfers: Max assist, Total assist, +2 physical assistance, +2 safety/equipment         General transfer comment: Pt severely limited d/t pain, bracing in standing; Needs 2 person total assist for power up and for balance    Ambulation/Gait                  Stairs            Wheelchair Mobility     Tilt Bed    Modified Rankin (Stroke Patients Only)       Balance Overall balance assessment: Needs assistance Sitting-balance support: Bilateral upper extremity supported, Feet supported Sitting balance-Leahy Scale: Poor Sitting balance - Comments: Reliant on external support min to CGA sitting EOB Postural control: Right lateral lean Standing balance support: Bilateral upper extremity supported, During functional activity Standing balance-Leahy Scale: Zero Standing balance comment: Reliant on external support                             Pertinent Vitals/Pain Pain Assessment  Pain Assessment: Faces Faces Pain Scale: Hurts whole lot Pain Location: LLE Pain Descriptors / Indicators: Aching, Discomfort, Guarding, Grimacing, Moaning Pain Intervention(s): Limited activity within patient's tolerance, Repositioned    Home Living Family/patient expects to be discharged to:: Private residence Living Arrangements: Alone Available Help at Discharge: Family;Personal care attendant;Available PRN/intermittently (HH aid comes by x2 a  day) Type of Home: House Home Access: Level entry       Home Layout: One level Home Equipment: Shower seat;Cane - single point;Rollator (4 wheels);Rolling Walker (2 wheels);Lift chair Additional Comments: caregiver from 8:30-12:30 and again in the evening (pt not sure of times). caregivers 7 days/week    Prior Function Prior Level of Function : Needs assist;History of Falls (last six months)             Mobility Comments: Uses RW in home, rollator in community ADLs Comments: assist for shower transfer     Extremity/Trunk Assessment   Upper Extremity Assessment Upper Extremity Assessment: Defer to OT evaluation LUE Deficits / Details: L clavicle fx, unable to achieve 50 degrees shoulder flex LUE: Shoulder pain with ROM LUE Sensation: decreased light touch LUE Coordination: decreased fine motor;decreased gross motor    Lower Extremity Assessment Lower Extremity Assessment: Generalized weakness;LLE deficits/detail LLE Deficits / Details: Grossly decr AROM and strength, limited by pain post injury and surgical fixation of the hip    Cervical / Trunk Assessment Cervical / Trunk Assessment: Kyphotic  Communication   Communication Communication: No apparent difficulties    Cognition Arousal: Lethargic Behavior During Therapy: Flat affect                           PT - Cognition Comments: Slow to answer questions Following commands: Impaired Following commands impaired: Follows one step commands with increased time     Cueing Cueing Techniques: Verbal cues, Tactile cues     General Comments General comments (skin integrity, edema, etc.): Caregiver present during session, and helpful, VSS on RA    Exercises     Assessment/Plan    PT Assessment Patient needs continued PT services  PT Problem List Decreased strength;Decreased range of motion;Decreased activity tolerance;Decreased balance;Decreased mobility;Decreased cognition;Decreased  coordination;Decreased knowledge of use of DME;Decreased safety awareness;Decreased knowledge of precautions;Pain       PT Treatment Interventions DME instruction;Gait training;Functional mobility training;Therapeutic activities;Therapeutic exercise;Balance training;Neuromuscular re-education;Patient/family education;Cognitive remediation;Wheelchair mobility training;Manual techniques    PT Goals (Current goals can be found in the Care Plan section)  Acute Rehab PT Goals Patient Stated Goal: get back home PT Goal Formulation: With patient Time For Goal Achievement: 02/25/24 Potential to Achieve Goals: Good    Frequency Min 3X/week     Co-evaluation   Reason for Co-Treatment: Complexity of the patient's impairments (multi-system involvement);For patient/therapist safety;To address functional/ADL transfers   OT goals addressed during session: ADL's and self-care;Strengthening/ROM       AM-PAC PT "6 Clicks" Mobility  Outcome Measure Help needed turning from your back to your side while in a flat bed without using bedrails?: A Lot Help needed moving from lying on your back to sitting on the side of a flat bed without using bedrails?: Total Help needed moving to and from a bed to a chair (including a wheelchair)?: Total Help needed standing up from a chair using your arms (e.g., wheelchair or bedside chair)?: Total Help needed to walk in hospital room?: Total Help needed climbing 3-5 steps with a railing? : Total 6 Click Score: 7  End of Session Equipment Utilized During Treatment: Gait belt Activity Tolerance: Patient limited by pain Patient left: in chair;with call bell/phone within reach;with chair alarm set;with family/visitor present Nurse Communication: Mobility status;Precautions (considerations for back to bed) PT Visit Diagnosis: Unsteadiness on feet (R26.81);Other abnormalities of gait and mobility (R26.89);Pain;History of falling (Z91.81) Pain - Right/Left:  Right Pain - part of body: Leg;Shoulder (Clavicle)    Time: 4098-1191 PT Time Calculation (min) (ACUTE ONLY): 24 min   Charges:   PT Evaluation $PT Eval Moderate Complexity: 1 Mod   PT General Charges $$ ACUTE PT VISIT: 1 Visit         Darcus Eastern, PT  Acute Rehabilitation Services Office [phone] Secure Chat welcomed   Marcial Setting 02/25/2024, 11:57 AM

## 2024-02-25 NOTE — Progress Notes (Addendum)
 PROGRESS NOTE    FOLASHADE WINCEK  ZOX:[id number] DOB: 1930/11/24 DOA: 02/23/2024 PCP: Tita Form, MD   Brief Narrative:  IVELISSE BOGIN is a 89 y.o. female with history of cecal cancer status post resection and in remission had a mechanical fall, intake imaging confirmed left femur and left clavicle fracture.  Hospitalist called for admission, orthopedic surgery called in consult.  Assessment & Plan:   Principal Problem:   Hip fracture (HCC) Active Problems:   Cecal cancer (HCC)   S/P right colectomy   Elevated blood pressure reading   Clavicular fracture   Leucocytosis   Left Hip and left clavicle fracture Mechanical fall with ongoing acute on chronic ambulatory dysfunction Orthopedic surgery following - L hip IM nail 5/5, tolerated well Sling for L clavicular fracture - conservative management weightbearing per orthopedic surgery Pain currently well-controlled -defer to orthopedic surgery Perioperative DVT prophylaxis per surgery (SCD/Lovenox ) PT/ OT eval per orthopedic recommendations -likely dispositional SNF  Elevated BP without history of hypertension Secondary to pain - improving with pain control   Leukocytosis- reactive. Downtrending appropriately, no indication for antibiotics   History of cecal cancer-  S/p resection in remission. No active issue.  DVT prophylaxis: enoxaparin  (LOVENOX ) injection 40 mg Start: 02/25/24 0800 SCDs Start: 02/24/24 1338 SCDs Start: 02/23/24 2252 Code Status:   Code Status: Limited: Do not attempt resuscitation (DNR) -DNR-LIMITED -Do Not Intubate/DNI  Family Communication: Caretaker at bedside  Status is: Inpatient  Dispo: The patient is from: Home              Anticipated d/c is to: SNF              Anticipated d/c date is: 40 to 72 hours              Patient currently not medically stable for discharge  Consultants:  Orthopedic surgery  Procedures:  Left hip IM nail 5/5  Antimicrobials:  None   Subjective: No acute  issues or events overnight, pain currently well-controlled denies nausea vomiting diarrhea constipation any fevers chills or chest pain  Objective: Vitals:   02/24/24 1530 02/24/24 2103 02/25/24 0421 02/25/24 0724  BP: 132/64 129/71 127/78 (!) 134/97  Pulse: 93 96 100 80  Resp: 16 18 16 17   Temp: 97.6 F (36.4 C) 98.7 F (37.1 C) 98.2 F (36.8 C) 98 F (36.7 C)  TempSrc: Oral Oral Oral   SpO2: 98% 100% 100% 100%  Weight:      Height:        Intake/Output Summary (Last 24 hours) at 02/25/2024 0731 Last data filed at 02/24/2024 1332 Gross per 24 hour  Intake 1857.26 ml  Output 430 ml  Net 1427.26 ml   Filed Weights   02/23/24 1702 02/24/24 0925  Weight: 54 kg 54 kg    Examination:  General: Sitting up in bed, somewhat somnolent but easily arousable HEENT:  Normocephalic atraumatic.  Sclerae nonicteric, noninjected.  Extraocular movements intact bilaterally. Neck:  Without mass or deformity.  Trachea is midline. Lungs:  Clear to auscultate bilaterally without rhonchi, wheeze, or rales. Heart:  Regular rate and rhythm.  Without murmurs, rubs, or gallops. Abdomen:  Soft, nontender, nondistended.  Without guarding or rebound. Extremities: Left arm in sling, right arm 5 out of 5 strength.  Left hip bandage clean dry intact Skin:  Warm and dry, no erythema.   Data Reviewed: I have personally reviewed following labs and imaging studies  CBC: Recent Labs  Lab 02/23/24 1706 02/24/24 8119  WBC 14.7* 12.5*  NEUTROABS 12.2*  --   HGB 13.7 12.4  HCT 40.9 37.5  MCV 94.0 94.2  PLT 352 293   Basic Metabolic Panel: Recent Labs  Lab 02/23/24 1706 02/24/24 0632  NA 136 135  K 4.0 3.7  CL 100 101  CO2 23 25  GLUCOSE 120* 132*  BUN 20 12  CREATININE 0.52 0.45  CALCIUM 9.0 8.6*   GFR: Estimated Creatinine Clearance: 34 mL/min (by C-G formula based on SCr of 0.45 mg/dL).  Coagulation Profile: Recent Labs  Lab 02/23/24 1706  INR 1.1    Radiology Studies: DG FEMUR  PORT MIN 2 VIEWS LEFT Result Date: 02/24/2024 CLINICAL DATA:  Fracture.  Status post intramedullary nail. EXAM: LEFT FEMUR PORTABLE 2 VIEWS COMPARISON:  Left femoral radiographs 02/23/2024 FINDINGS: There is diffuse decreased bone mineralization. Interval cephalo medullary nail fixation of the previously seen proximal left femoral intertrochanteric fracture including two trans trochanteric screws. Improved alignment. Persistent fracture line lucency. Mild left femoroacetabular joint space narrowing. Moderate medial and patellofemoral knee compartment joint space narrowing with moderate superior patellar degenerative spurring. No joint effusion. Expected lateral left hip subcutaneous air. IMPRESSION: Interval cephalomedullary nail fixation of the previously seen proximal left femoral intertrochanteric fracture with improved alignment. Electronically Signed   By: Bertina Broccoli M.D.   On: 02/24/2024 14:38   DG HIP UNILAT WITH PELVIS 2-3 VIEWS LEFT Result Date: 02/24/2024 CLINICAL DATA:  Elective surgery. EXAM: DG HIP (WITH OR WITHOUT PELVIS) 2-3V LEFT COMPARISON:  Preoperative imaging FINDINGS: Eight fluoroscopic spot views of the left hip submitted from the operating room. Femoral intramedullary nail with trans trochanteric and distal locking screw fixation traverse proximal femur fracture. Fluoroscopy time 46 seconds. Dose 8.96 mGy. IMPRESSION: Intraoperative fluoroscopy during proximal femur fracture fixation. Electronically Signed   By: Chadwick Colonel M.D.   On: 02/24/2024 12:36   DG C-Arm 1-60 Min-No Report Result Date: 02/24/2024 Fluoroscopy was utilized by the requesting physician.  No radiographic interpretation.   DG Shoulder Left Portable Result Date: 02/23/2024 CLINICAL DATA:  Marvell Slider.  Left shoulder pain EXAM: LEFT SHOULDER COMPARISON:  None Available. FINDINGS: There is a nondisplaced fracture of the distal clavicle. The [facility] joint is intact. The glenohumeral joint is maintained. No definite left-sided  rib fractures or pneumothorax. IMPRESSION: Nondisplaced distal left clavicle fracture. Electronically Signed   By: Marrian Siva M.D.   On: 02/23/2024 19:24   DG Pelvis Portable Result Date: 02/23/2024 CLINICAL DATA:  Marvell Slider.  Left hip pain. EXAM: PORTABLE PELVIS 1-2 VIEWS COMPARISON:  None Available. FINDINGS: Displaced intertrochanteric fracture of the left hip with significant varus deformity. The pubic symphysis and SI joints are intact. No pelvic fractures. Right hip prosthesis is intact. IMPRESSION: Displaced intertrochanteric fracture of the left hip. Electronically Signed   By: Marrian Siva M.D.   On: 02/23/2024 19:23   DG FEMUR MIN 2 VIEWS LEFT Result Date: 02/23/2024 CLINICAL DATA:  Marvell Slider.  Left leg pain. EXAM: LEFT FEMUR 2 VIEWS COMPARISON:  None Available. FINDINGS: Displaced intertrochanteric fracture of the left hip with significant varus deformity. The femoral shaft is intact. No left-sided pelvic fractures are identified. IMPRESSION: Displaced intertrochanteric fracture of the left hip. Electronically Signed   By: Marrian Siva M.D.   On: 02/23/2024 19:23   CT HEAD WO CONTRAST Result Date: 02/23/2024 CLINICAL DATA:  Marvell Slider. EXAM: CT HEAD WITHOUT CONTRAST CT CERVICAL SPINE WITHOUT CONTRAST TECHNIQUE: Multidetector CT imaging of the head and cervical spine was performed following the standard protocol without intravenous  contrast. Multiplanar CT image reconstructions of the cervical spine were also generated. RADIATION DOSE REDUCTION: This exam was performed according to the departmental dose-optimization program which includes automated exposure control, adjustment of the mA and/or kV according to patient size and/or use of iterative reconstruction technique. COMPARISON:  Cervical spine CT scan from 06/21/2023 FINDINGS: CT HEAD FINDINGS Brain: Age related cerebral atrophy, ventriculomegaly and periventricular white matter disease. No extra-axial fluid collections are identified. No CT findings for  acute hemispheric infarction or intracranial hemorrhage. No mass lesions. The brainstem and cerebellum are normal. Vascular: Vascular calcifications but no aneurysm or hyperdense vessels. Skull: Normal. Negative for fracture or focal lesion. Sinuses/Orbits: The paranasal sinuses and mastoid air cells are clear except for chronic sinusitis involving the right half of the sphenoid sinus. The globes are intact. Other: No scalp lesions or scalp hematoma. CT CERVICAL SPINE FINDINGS Alignment: Normal. Skull base and vertebrae: No acute fracture. No primary bone lesion or focal pathologic process. Stable sclerotic lesion involving the right aspect of C1 consistent with a benign bone island. Soft tissues and spinal canal: No prevertebral fluid or swelling. No visible canal hematoma. Disc levels: The spinal canal is quite generous. No significant canal stenosis. Advanced multilevel facet disease, unchanged. Mild multilevel bony foraminal narrowing. Upper chest: The lung apices are grossly clear. Other: Carotid artery calcifications are noted. No neck mass or hematoma. IMPRESSION: 1. Age related cerebral atrophy, ventriculomegaly and periventricular white matter disease. 2. No acute intracranial findings or skull fracture. 3. Chronic right half sphenoid sinusitis. 4. Normal alignment of the cervical vertebral bodies and no acute fracture. 5. Advanced multilevel facet disease. Electronically Signed   By: Marrian Siva M.D.   On: 02/23/2024 19:22   CT CERVICAL SPINE WO CONTRAST Result Date: 02/23/2024 CLINICAL DATA:  Marvell Slider. EXAM: CT HEAD WITHOUT CONTRAST CT CERVICAL SPINE WITHOUT CONTRAST TECHNIQUE: Multidetector CT imaging of the head and cervical spine was performed following the standard protocol without intravenous contrast. Multiplanar CT image reconstructions of the cervical spine were also generated. RADIATION DOSE REDUCTION: This exam was performed according to the departmental dose-optimization program which includes  automated exposure control, adjustment of the mA and/or kV according to patient size and/or use of iterative reconstruction technique. COMPARISON:  Cervical spine CT scan from 06/21/2023 FINDINGS: CT HEAD FINDINGS Brain: Age related cerebral atrophy, ventriculomegaly and periventricular white matter disease. No extra-axial fluid collections are identified. No CT findings for acute hemispheric infarction or intracranial hemorrhage. No mass lesions. The brainstem and cerebellum are normal. Vascular: Vascular calcifications but no aneurysm or hyperdense vessels. Skull: Normal. Negative for fracture or focal lesion. Sinuses/Orbits: The paranasal sinuses and mastoid air cells are clear except for chronic sinusitis involving the right half of the sphenoid sinus. The globes are intact. Other: No scalp lesions or scalp hematoma. CT CERVICAL SPINE FINDINGS Alignment: Normal. Skull base and vertebrae: No acute fracture. No primary bone lesion or focal pathologic process. Stable sclerotic lesion involving the right aspect of C1 consistent with a benign bone island. Soft tissues and spinal canal: No prevertebral fluid or swelling. No visible canal hematoma. Disc levels: The spinal canal is quite generous. No significant canal stenosis. Advanced multilevel facet disease, unchanged. Mild multilevel bony foraminal narrowing. Upper chest: The lung apices are grossly clear. Other: Carotid artery calcifications are noted. No neck mass or hematoma. IMPRESSION: 1. Age related cerebral atrophy, ventriculomegaly and periventricular white matter disease. 2. No acute intracranial findings or skull fracture. 3. Chronic right half sphenoid sinusitis. 4. Normal alignment  of the cervical vertebral bodies and no acute fracture. 5. Advanced multilevel facet disease. Electronically Signed   By: Marrian Siva M.D.   On: 02/23/2024 19:22   DG Chest Port 1 View Result Date: 02/23/2024 CLINICAL DATA:  Follow-up. EXAM: PORTABLE CHEST 1 VIEW  COMPARISON:  09/02/2023 FINDINGS: The cardiac silhouette, mediastinal and hilar contours are within normal limits. No acute pulmonary process. No infiltrates, edema, effusions or pneumothorax. The bony thorax is intact. No definite rib fractures. IMPRESSION: No acute cardiopulmonary findings. Electronically Signed   By: Marrian Siva M.D.   On: 02/23/2024 19:16        Scheduled Meds:  acetaminophen   1,000 mg Oral Q8H   docusate sodium   100 mg Oral BID   enoxaparin  (LOVENOX ) injection  40 mg Subcutaneous Q24H   feeding supplement  237 mL Oral BID BM   multivitamin with minerals  1 tablet Oral Daily   Continuous Infusions:   LOS: 2 days   Time spent:  Haydee Lipa, DO [facility]  If 7PM-7AM, please contact night-coverage [email or web address]  02/25/2024, 7:31 AM

## 2024-02-25 NOTE — Evaluation (Signed)
 Clinical/Bedside Swallow Evaluation Patient Details  Name: Frances Ortiz MRN: [id number] Date of Birth: [birth year]  Today's Date: 02/25/2024 Time: SLP Start Time (ACUTE ONLY): 1000 SLP Stop Time (ACUTE ONLY): 1013 SLP Time Calculation (min) (ACUTE ONLY): 13 min  Past Medical History:  Past Medical History:  Diagnosis Date   Anxiety    GERD (gastroesophageal reflux disease)    Heart murmur    HLD (hyperlipidemia)    Intestinal obstruction (HCC)    Osteoporosis    PONV (postoperative nausea and vomiting)    Skin cancer    melanoma, basal cell, and squamous   Past Surgical History:  Past Surgical History:  Procedure Laterality Date   ANTERIOR APPROACH HEMI HIP ARTHROPLASTY Right 06/22/2023   Procedure: POSTERIOR APPROACH HEMI HIP ARTHROPLASTY;  Surgeon: Osa Blase, MD;  Location: [facility] OR;  Service: Orthopedics;  Laterality: Right;   BIOPSY  07/14/2023   Procedure: BIOPSY;  Surgeon: Felecia Hopper, MD;  Location: [facility] ENDOSCOPY;  Service: Gastroenterology;;   BREAST BIOPSY     bilateral   BREAST EXCISIONAL BIOPSY Right 30 years ago   fibroadenoma   BREAST EXCISIONAL BIOPSY Left 40 years ago   sclerosis thickening   CATARACT EXTRACTION W/ INTRAOCULAR LENS  IMPLANT, BILATERAL Bilateral    CESAREAN SECTION     x 2   CHOLECYSTECTOMY N/A 11/27/2012   Procedure: LAPAROSCOPIC CHOLECYSTECTOMY WITH INTRAOPERATIVE CHOLANGIOGRAM;  Surgeon: Mayme Spearman, MD;  Location: [facility] OR;  Service: General;  Laterality: N/A;   COLECTOMY Right 10/07/2017   right   COLONOSCOPY  2005   COLONOSCOPY  08/2017   "found mass in cecum"   DILATION AND CURETTAGE OF UTERUS     ESOPHAGOGASTRODUODENOSCOPY     ESOPHAGOGASTRODUODENOSCOPY (EGD) WITH PROPOFOL  N/A 07/14/2023   Procedure: ESOPHAGOGASTRODUODENOSCOPY (EGD) WITH PROPOFOL ;  Surgeon: Felecia Hopper, MD;  Location: [facility] END[facility];  Service: Gastroenterology;  Laterality: N/A;   INTRAMEDULLARY (IM) NAIL INTERTROCHANTERIC Left 02/24/2024   Procedure:  FIXATION, FRACTURE, INTERTROCHANTERIC, WITH INTRAMEDULLARY ROD;  Surgeon: Hardy Lia, MD;  Location: [facility] OR;  Service: Orthopedics;  Laterality: Left;   IR KYPHO EA ADDL LEVEL THORACIC OR LUMBAR  02/27/2023   IR KYPHO LUMBAR INC FX REDUCE BONE BX UNI/BIL CANNULATION INC/IMAGING  02/27/2023   LAPAROSCOPIC RIGHT COLECTOMY Right 10/07/2017   Procedure: LAPAROSCOPIC RIGHT COLECTOMY ERAS PATHWAY;  Surgeon: Caralyn Chandler, MD;  Location: [facility] OR;  Service: General;  Laterality: Right;   SKIN CANCER EXCISION     TONSILLECTOMY     HPI:  89 yo female presenting 5/4 with hip pain s/p fall. Pt admitted with L distal clavicle fracture, L hip fracture. IMN of L hip performed 5/5    PMH includes: GERD, HLD, prior R hip replacement, cecal cancer s/p resection, anxiety    Assessment / Plan / Recommendation  Clinical Impression  Pt was seen while upright in her chair after working with PT/OT. She is a little tired and slow to respond at times, but following commands consistently and answering questions appropriately. She has no overt evidence of dysphagia or aspiration. Her voice is soft, but at her baseline per report of her and her private caregiver who is with her every day at home. They both also deny any h/o dysphagia. Recommend staying on regular solids and thin liquids. Education was provided about precautions to take in light of some AMS and deconditioning s/p surgery. SLP to f/u briefly but do not anticipate ongoing needs post-discharge.  SLP Visit Diagnosis: Dysphagia, unspecified (R13.10)  Aspiration Risk       Diet Recommendation Regular;Thin liquid    Liquid Administration via: Cup;Straw Medication Administration: Whole meds with liquid Supervision: Patient able to self feed;Intermittent supervision to cue for compensatory strategies Compensations: Slow rate;Small sips/bites;Minimize environmental distractions Postural Changes: Seated upright at 90 degrees;Remain upright for at least 30 minutes  after po intake    Other  Recommendations Oral Care Recommendations: Oral care BID    Recommendations for follow up therapy are one component of a multi-disciplinary discharge planning process, led by the attending physician.  Recommendations may be updated based on patient status, additional functional criteria and insurance authorization.  Follow up Recommendations No SLP follow up      Assistance Recommended at Discharge    Functional Status Assessment Patient has had a recent decline in their functional status and demonstrates the ability to make significant improvements in function in a reasonable and predictable amount of time.  Frequency and Duration min 1 x/week  1 week       Prognosis Prognosis for improved oropharyngeal function: Good      Swallow Study   General HPI: 89 yo female presenting 5/4 with hip pain s/p fall. Pt admitted with L distal clavicle fracture, L hip fracture. IMN of L hip performed 5/5    PMH includes: GERD, HLD, prior R hip replacement, cecal cancer s/p resection, anxiety Type of Study: Bedside Swallow Evaluation Previous Swallow Assessment: none in chart Diet Prior to this Study: Regular;Thin liquids (Level 0) Temperature Spikes Noted: No Respiratory Status: Room air History of Recent Intubation: No Behavior/Cognition: Alert;Cooperative;Pleasant mood;Other (Comment) (slow to respond at times) Oral Cavity Assessment: Within Functional Limits Oral Care Completed by SLP: No Oral Cavity - Dentition: Adequate natural dentition Vision: Functional for self-feeding Self-Feeding Abilities: Able to feed self Patient Positioning: Upright in chair Baseline Vocal Quality: Low vocal intensity (but at baseline per pt and private caregiver) Volitional Cough: Weak Volitional Swallow: Able to elicit (with delay)    Oral/Motor/Sensory Function Overall Oral Motor/Sensory Function: Within functional limits   Ice Chips Ice chips: Not tested   Thin Liquid Thin  Liquid: Within functional limits Presentation: Self Fed;Straw    Nectar Thick Nectar Thick Liquid: Not tested   Honey Thick Honey Thick Liquid: Not tested   Puree Puree: Not tested (pt declined)   Solid     Solid: Within functional limits Presentation: Self Fed      [name] Brooke., M.A. CCC-SLP Acute Rehabilitation Services Office: [phone]  Secure chat preferred  02/25/2024,10:42 AM

## 2024-02-25 NOTE — Evaluation (Signed)
 Occupational Therapy Evaluation Patient Details Name: Frances Ortiz MRN: [id number] DOB: November 21, 1930 Today's Date: 02/25/2024   History of Present Illness   Pt is a 89 y.o. female admitted to [facility] via EMS for fall in home. CT showed L Intertrochanteric hip fx and L clavicle fx. Non-op management for L clavicle. IMN of L hip performed 5/5.   PMH: hyperlipidemia, anxiety/depression, cecal cancer s/p R colectomy 2018, GERD, osteoporosis.     Clinical Impressions Pt admitted based on above, and was seen based on problem list below. PTA pt was living alone with a caregiver that comes 2x a day. Pt was independent with BADLs, but required assistance for shower transfers. Today pt is requiring set up  to total +2 for ADLs. Bed mobility and functional transfers are  max to total assist +2. Pt bracing for pain during mobility, unable to maintain sitting EOB without min to CGA. Pt presenting with delayed processing speed, potentially d/t pain meds. Recommendation of <3 hours of skilled rehab daily. OT will continue to follow acutely to maximize functional independence.        If plan is discharge home, recommend the following:   Two people to help with walking and/or transfers;Two people to help with bathing/dressing/bathroom;Supervision due to cognitive status;Assistance with feeding;Assistance with cooking/housework     Functional Status Assessment   Patient has had a recent decline in their functional status and demonstrates the ability to make significant improvements in function in a reasonable and predictable amount of time.     Equipment Recommendations   Other (comment) (Defer to next venue)     Recommendations for Other Services         Precautions/Restrictions   Precautions Precautions: Fall Recall of Precautions/Restrictions: Impaired Required Braces or Orthoses: Sling (For comfort LUE) Restrictions Weight Bearing Restrictions Per Provider Order: Yes LUE Weight  Bearing Per Provider Order: Weight bearing as tolerated LLE Weight Bearing Per Provider Order: Weight bearing as tolerated     Mobility Bed Mobility Overal bed mobility: Needs Assistance Bed Mobility: Supine to Sit     Supine to sit: Max assist, +2 for physical assistance, +2 for safety/equipment, HOB elevated, Used rails     General bed mobility comments: use of rail with RUE, min to CGA to maintain sitting EOB    Transfers Overall transfer level: Needs assistance Equipment used: 2 person hand held assist Transfers: Bed to chair/wheelchair/BSC   Stand pivot transfers: Max assist, Total assist, +2 physical assistance, +2 safety/equipment         General transfer comment: Pt severely limited d/t pain , bracing in standing      Balance Overall balance assessment: Needs assistance Sitting-balance support: Bilateral upper extremity supported, Feet supported Sitting balance-Leahy Scale: Poor Sitting balance - Comments: Reliant on external support min to CGA sitting EOB Postural control: Right lateral lean Standing balance support: Bilateral upper extremity supported, During functional activity Standing balance-Leahy Scale: Zero Standing balance comment: Reliant on external support           ADL either performed or assessed with clinical judgement   ADL Overall ADL's : Needs assistance/impaired Eating/Feeding: Set up;Bed level   Grooming: Minimal assistance;Bed level Grooming Details (indicate cue type and reason): Able to wash face with set up, would need min assist for bil tasks         Upper Body Dressing : Moderate assistance   Lower Body Dressing: Total assistance;+2 for physical assistance;Sit to/from stand;Sitting/lateral leans   Toilet Transfer: Maximal assistance;Total assistance;+2 for  physical assistance Toilet Transfer Details (indicate cue type and reason): +2 hand held assist         Functional mobility during ADLs: Total assistance;+2 for  physical assistance;+2 for safety/equipment General ADL Comments: Limited by pain     Vision Baseline Vision/History: 1 Wears glasses Vision Assessment?: No apparent visual deficits            Pertinent Vitals/Pain Pain Assessment Pain Assessment: Faces Faces Pain Scale: Hurts whole lot Pain Location: LLE Pain Descriptors / Indicators: Aching, Discomfort, Guarding, Grimacing, Moaning Pain Intervention(s): Repositioned, Limited activity within patient's tolerance     Extremity/Trunk Assessment Upper Extremity Assessment Upper Extremity Assessment: Generalized weakness;Right hand dominant;LUE deficits/detail LUE Deficits / Details: L clavicle fx, unable to achieve 50 degrees shoulder flex LUE: Shoulder pain with ROM LUE Sensation: decreased light touch LUE Coordination: decreased fine motor;decreased gross motor   Lower Extremity Assessment Lower Extremity Assessment: Defer to PT evaluation   Cervical / Trunk Assessment Cervical / Trunk Assessment: Kyphotic   Communication Communication Communication: No apparent difficulties   Cognition Arousal: Lethargic Behavior During Therapy: Flat affect Cognition: Cognition impaired     Executive functioning impairment (select all impairments): Organization, Sequencing, Problem solving OT - Cognition Comments: Delayed processing speed, possible d/t pain meds, per caregiver typically more alert       Following commands: Impaired Following commands impaired: Follows one step commands with increased time     Cueing  General Comments   Cueing Techniques: Verbal cues;Tactile cues  Caregiver present during session, and helpful, VSS on RA           Home Living Family/patient expects to be discharged to:: Private residence Living Arrangements: Alone Available Help at Discharge: Family;Personal care attendant;Available PRN/intermittently (HH aid comes by x2 a day) Type of Home: House Home Access: Level entry     Home  Layout: One level     Bathroom Shower/Tub: Producer, television/film/video: Standard Bathroom Accessibility: Yes How Accessible: Accessible via walker Home Equipment: Shower seat;Cane - single point;Rollator (4 wheels);Rolling Walker (2 wheels);Lift chair   Additional Comments: caregiver from 8:30-12:30 and again in the evening (pt not sure of times). caregivers 7 days/week      Prior Functioning/Environment Prior Level of Function : Needs assist;History of Falls (last six months)       Mobility Comments: Uses RW in home, rollator in community ADLs Comments: assist for shower transfer    OT Problem List: Decreased range of motion;Decreased strength;Impaired balance (sitting and/or standing);Decreased activity tolerance;Decreased cognition;Decreased safety awareness;Decreased knowledge of use of DME or AE   OT Treatment/Interventions: Self-care/ADL training;Therapeutic exercise;Energy conservation;DME and/or AE instruction;Therapeutic activities;Patient/family education;Balance training      OT Goals(Current goals can be found in the care plan section)   Acute Rehab OT Goals Patient Stated Goal: To be in less pain OT Goal Formulation: With patient Time For Goal Achievement: 03/10/24 Potential to Achieve Goals: Good   OT Frequency:  Min 2X/week    Co-evaluation PT/OT/SLP Co-Evaluation/Treatment: Yes Reason for Co-Treatment: Complexity of the patient's impairments (multi-system involvement);For patient/therapist safety;To address functional/ADL transfers   OT goals addressed during session: ADL's and self-care;Strengthening/ROM      AM-PAC OT "6 Clicks" Daily Activity     Outcome Measure Help from another person eating meals?: A Little Help from another person taking care of personal grooming?: A Little Help from another person toileting, which includes using toliet, bedpan, or urinal?: Total Help from another person bathing (including washing, rinsing, drying)?: A  Lot Help from another person to put on and taking off regular upper body clothing?: A Lot Help from another person to put on and taking off regular lower body clothing?: Total 6 Click Score: 12   End of Session Equipment Utilized During Treatment: Gait belt Nurse Communication: Mobility status  Activity Tolerance: Patient limited by pain Patient left: in chair;with call bell/phone within reach;with chair alarm set;with family/visitor present  OT Visit Diagnosis: Unsteadiness on feet (R26.81);Other abnormalities of gait and mobility (R26.89);Repeated falls (R29.6);Muscle weakness (generalized) (M62.81)                Time: 8119-1478 OT Time Calculation (min): 47 min Charges:  OT General Charges $OT Visit: 1 Visit OT Evaluation $OT Eval Moderate Complexity: 1 Mod OT Treatments $Self Care/Home Management : 8-22 mins  Delmer Ferraris, OT  Acute Rehabilitation Services Office [phone] Secure chat preferred   Mickael Alamo 02/25/2024, 10:33 AM

## 2024-02-25 NOTE — Progress Notes (Signed)
 Orthopaedic Trauma Service Progress Note  [name] MRN: [id number] DOB/AGE: Dec 26, 1930 89 y.o.  Subjective:  Doing fair Just transferred to chair with therapy  Uses walker at baseline   Agreeable to SNF ([facility]) if needed   Vitamin d levels look good   ROS As above  Today's  total administered Morphine  Milligram Equivalents: 10 Yesterday's total administered Morphine  Milligram Equivalents: 80  Objective:   VITALS:   Vitals:   02/24/24 1530 02/24/24 2103 02/25/24 0421 02/25/24 0724  BP: 132/64 129/71 127/78 (!) 134/97  Pulse: 93 96 100 80  Resp: 16 18 16 17   Temp: 97.6 F (36.4 C) 98.7 F (37.1 C) 98.2 F (36.8 C) 98 F (36.7 C)  TempSrc: Oral Oral Oral   SpO2: 98% 100% 100% 100%  Weight:      Height:        Estimated body mass index is 24.87 kg/m as calculated from the following:   Height as of this encounter: 4\' 10"  (1.473 m).   Weight as of this encounter: 54 kg.   Intake/Output      05/05 0701 05/06 0700 05/06 0701 05/07 0700   I.V. (mL/kg) 1857.3 (34.4)    Total Intake(mL/kg) 1857.3 (34.4)    Urine (mL/kg/hr) 400 (0.3)    Blood 30    Total Output 430    Net +1427.3           LABS  Results for orders placed or performed during the hospital encounter of 02/23/24 (from the past 24 hours)  CBC     Status: Abnormal   Collection Time: 02/25/24  5:32 AM  Result Value Ref Range   WBC 11.3 (H) 4.0 - 10.5 K/uL   RBC 3.35 (L) 3.87 - 5.11 MIL/uL   Hemoglobin 10.7 (L) 12.0 - 15.0 g/dL   HCT 40.9 (L) 81.1 - 91.4 %   MCV 94.9 80.0 - 100.0 fL   MCH 31.9 26.0 - 34.0 pg   MCHC 33.6 30.0 - 36.0 g/dL   RDW 78.2 95.6 - 21.3 %   Platelets 247 150 - 400 K/uL   nRBC 0.0 0.0 - 0.2 %  Basic metabolic panel     Status: Abnormal   Collection Time: 02/25/24  5:32 AM  Result Value Ref Range   Sodium 135 135 - 145 mmol/L   Potassium 3.5 3.5 - 5.1 mmol/L   Chloride 102  98 - 111 mmol/L   CO2 23 22 - 32 mmol/L   Glucose, Bld 113 (H) 70 - 99 mg/dL   BUN 13 8 - 23 mg/dL   Creatinine, Ser 0.86 0.44 - 1.00 mg/dL   Calcium 8.4 (L) 8.9 - 10.3 mg/dL   GFR, Estimated >57 >84 mL/min   Anion gap 10 5 - 15  VITAMIN D 25 Hydroxy (Vit-D Deficiency, Fractures)     Status: None   Collection Time: 02/25/24  5:32 AM  Result Value Ref Range   Vit D, 25-Hydroxy 45.36 30 - 100 ng/mL     PHYSICAL EXAM:   Gen: sitting up in char, NAD Lungs: unlabored  Ext:       Left Lower Extremity Dressing is clean, dry and intact, scant strikethrough   Extremity is warm  No DCT  Compartments are soft  No pain out of proportion with passive stretching  of his toes or ankle  DPN, SPN, TN sensory functions are intact  EHL, FHL, lesser toe motor functions intact  Ankle flexion, extension, inversion eversion intact  + DP pulse       Left upper extremity   Shoulder sore over distal clavicle but no acute changes    Assessment/Plan: 1 Day Post-Op   Principal Problem:   Osteoporotic fracture of left hip, initial encounter [facility]) Active Problems:   Cecal cancer (HCC)   S/P right colectomy   Elevated blood pressure reading   Clavicular fracture   Leucocytosis   Anti-infectives (From admission, onward)    Start     Dose/Rate Route Frequency Ordered Stop   02/24/24 1600  ceFAZolin  (ANCEF ) IVPB 2g/100 mL premix        2 g 200 mL/hr over 30 Minutes Intravenous Every 6 hours 02/24/24 1337 02/24/24 2137   02/24/24 1000  ceFAZolin  (ANCEF ) IVPB 2g/100 mL premix        2 g 200 mL/hr over 30 Minutes Intravenous On call to O.R. 02/24/24 0948 02/24/24 1100   02/24/24 0900  ceFAZolin  (ANCEF ) 2-4 GM/100ML-% IVPB       Note to Pharmacy: Franceen Inches: cabinet override      02/24/24 0900 02/24/24 2114     .  POD/HD#: 1  89 y/o female s/p Ground-level fall with left hip fracture  - Left hip fracture s/p IMN Weightbearing Weight-bear as tolerated left leg with  assistance   ROM/Activity   No range of motion restrictions left hip or knee   Wound care   Daily wound care starting 06/28/2024   PT and OT evaluation  TOC consult for SNF  -Left distal clavicle fracture  Nonoperative treatment  No formal restrictions  Range of motion as tolerated and can weight-bear as tolerated use a walker  Sling is only for comfort  - Pain management:  Multimodal  Minimize narcotics  - ABL anemia/Hemodynamics  Stable but monitor . - Medical issues   Per primary   - DVT/PE prophylaxis:  Lovenox  and scds  - ID:   Periop abx   - Metabolic Bone Disease:  Vitamin d levels look good  Fracture is a fragility fracture and indicative of osteoporosis   History of R femoral neck fracture and lumbar compression fxs s/p R hip hemiarthroplasty and kyphoplasty   - Activity:  As above  - Impediments to fracture healing:  Age  Poor bone quality   - Dispo:  Therapy evals  TOC consult for SNF     Geroldine Kotyk, PA-C [phone] (C) 02/25/2024, 10:08 AM  [facility] [address] [place] [address] [phone] Deanna [phone] (F)    After 5pm and on the weekends please log on to Amion, go to orthopaedics and the look under the [facility] Call for the provider(s) on call. You can also call our office at [phone] and then follow the prompts to be connected to the call team.  [name], female   DOB: 1930/11/18, 89 y.o.   MRN: [id number]

## 2024-02-26 DIAGNOSIS — M80052A Age-related osteoporosis with current pathological fracture, left femur, initial encounter for fracture: Secondary | ICD-10-CM | POA: Diagnosis not present

## 2024-02-26 LAB — BASIC METABOLIC PANEL WITH GFR
Anion gap: 9 (ref 5–15)
BUN: 24 mg/dL — ABNORMAL HIGH (ref 8–23)
CO2: 26 mmol/L (ref 22–32)
Calcium: 9 mg/dL (ref 8.9–10.3)
Chloride: 102 mmol/L (ref 98–111)
Creatinine, Ser: 0.65 mg/dL (ref 0.44–1.00)
GFR, Estimated: >60 mL/min (ref >60)
Glucose, Bld: 111 mg/dL — ABNORMAL HIGH (ref 70–99)
Potassium: 3.9 mmol/L (ref 3.5–5.1)
Sodium: 137 mmol/L (ref 135–145)

## 2024-02-26 LAB — CBC
HCT: 34.2 % — ABNORMAL LOW (ref 36.0–46.0)
Hemoglobin: 11.3 g/dL — ABNORMAL LOW (ref 12.0–15.0)
MCH: 31.8 pg (ref 26.0–34.0)
MCHC: 33 g/dL (ref 30.0–36.0)
MCV: 96.3 fL (ref 80.0–100.0)
Platelets: 298 10*3/uL (ref 150–400)
RBC: 3.55 MIL/uL — ABNORMAL LOW (ref 3.87–5.11)
RDW: 13.9 % (ref 11.5–15.5)
WBC: 13.1 10*3/uL — ABNORMAL HIGH (ref 4.0–10.5)
nRBC: 0 % (ref 0.0–0.2)

## 2024-02-26 MED ORDER — METHOCARBAMOL 500 MG PO TABS
500.0000 mg | ORAL_TABLET | Freq: Three times a day (TID) | ORAL | Status: DC | PRN
  Administered 2024-02-27: 500 mg via ORAL
  Filled 2024-02-26: qty 1

## 2024-02-26 MED ORDER — POLYETHYLENE GLYCOL 3350 17 G PO PACK: 17.0000 g | PACK | Freq: Every day | ORAL | Status: DC | PRN

## 2024-02-26 NOTE — Plan of Care (Signed)

## 2024-02-26 NOTE — TOC Progression Note (Addendum)
 Transition of Care [facility]) - Progression Note    Patient Details  Name: Frances Ortiz MRN: [id number] Date of Birth: [birth year]  [facility] of [facility] CM/SW Contact  Katrinka Parr, [place] Phone Number: 02/26/2024, 11:58 AM  Clinical Narrative:     Referral is pending for [facility]. CSW contacted [facility] liaison and requested they review referral. They will notify CSW when a decision regarding bed offer is made.   1230: [facility] confirmed they can offer a bed. CSW confirmed with pt's daugther they are okay with private fees that insurance doesn't cover. SNF auth requested  in online portal. [id number]  Auth is pending  Expected Discharge Plan: Skilled Nursing Facility Barriers to Discharge: Continued Medical Work up, English as a second language teacher, SNF Pending bed offer  Expected Discharge Plan and Services                                               Social Determinants of Health (SDOH) Interventions SDOH Screenings   Food Insecurity: No Food Insecurity (02/25/2024)  Housing: Low Risk  (02/25/2024)  Transportation Needs: No Transportation Needs (02/25/2024)  Utilities: Not At Risk (02/25/2024)  Social Connections: Moderately Isolated (02/25/2024)  Tobacco Use: Low Risk  (02/24/2024)    Readmission Risk Interventions     No data to display

## 2024-02-26 NOTE — Progress Notes (Signed)
 PROGRESS NOTE    AMALA LAKATOS  ZHY:[id number] DOB: [birth year] DOA: 02/23/2024 PCP: Tita Form, MD   Brief Narrative:  KANDEE STETZEL is a 89 y.o. female with history of cecal cancer status post resection and in remission had a mechanical fall, intake imaging confirmed left femur and left clavicle fracture.  Hospitalist called for admission, orthopedic surgery called in consult.  Assessment & Plan:  Left Hip and left clavicle fracture Mechanical fall with ongoing acute on chronic ambulatory dysfunction Orthopedic surgery following - L hip IM nail 5/5, tolerated well Sling for L clavicular fracture - conservative management Pain seems to be reasonably well-controlled. Seen by PT and OT.  Needs skilled nursing facility for short-term rehab.  TOC is following.  Elevated BP without history of hypertension Secondary to pain - improving with pain control   Leukocytosis Most likely reactive.  Noted to be afebrile.   History of cecal cancer-  S/p resection in remission. No active issue.  DVT prophylaxis: Lovenox  Code Status: DNR Family Communication: No family at bedside Disposition: SNF.    Consultants:  Orthopedic surgery  Procedures:  Left hip IM nail 5/5  Antimicrobials:  None   Subjective: Patient worried that she will not get better.  She was reassured.  She was told to continue to work with physical therapy.  Is any significant pain currently.  Most of the pain is when she tries to move her leg.  Objective: Vitals:   02/25/24 1928 02/25/24 2325 02/26/24 0355 02/26/24 0752  BP:  122/78 135/66 (!) 143/59  Pulse: 99 98 (!) 106 70  Resp: 18 18 16    Temp:  98.1 F (36.7 C) 98.2 F (36.8 C) 98.3 F (36.8 C)  TempSrc:  Oral Oral Oral  SpO2:  98% 95% 93%  Weight:      Height:        Intake/Output Summary (Last 24 hours) at 02/26/2024 0937 Last data filed at 02/25/2024 2040 Gross per 24 hour  Intake 120 ml  Output 700 ml  Net -580 ml   Filed Weights    02/23/24 1702 02/24/24 0925  Weight: 54 kg 54 kg    Examination:  General appearance: Awake alert.  In no distress Resp: Clear to auscultation bilaterally.  Normal effort Cardio: S1-S2 is normal regular.  No S3-S4.  No rubs murmurs or bruit GI: Abdomen is soft.  Nontender nondistended.  Bowel sounds are present normal.  No masses organomegaly Extremities: Limited range of motion of the left lower extremity   Data Reviewed: I have personally reviewed following labs and imaging studies  CBC: Recent Labs  Lab 02/23/24 1706 02/24/24 0632 02/25/24 0532 02/26/24 0445  WBC 14.7* 12.5* 11.3* 13.1*  NEUTROABS 12.2*  --   --   --   HGB 13.7 12.4 10.7* 11.3*  HCT 40.9 37.5 31.8* 34.2*  MCV 94.0 94.2 94.9 96.3  PLT 352 293 247 298   Basic Metabolic Panel: Recent Labs  Lab 02/23/24 1706 02/24/24 0632 02/25/24 0532 02/26/24 0445  NA 136 135 135 137  K 4.0 3.7 3.5 3.9  CL 100 101 102 102  CO2 23 25 23 26   GLUCOSE 120* 132* 113* 111*  BUN 20 12 13  24*  CREATININE 0.52 0.45 0.48 0.65  CALCIUM 9.0 8.6* 8.4* 9.0   GFR: Estimated Creatinine Clearance: 34 mL/min (by C-G formula based on SCr of 0.65 mg/dL).  Coagulation Profile: Recent Labs  Lab 02/23/24 1706  INR 1.1    Radiology Studies: DG FEMUR  PORT MIN 2 VIEWS LEFT Result Date: 02/24/2024 CLINICAL DATA:  Fracture.  Status post intramedullary nail. EXAM: LEFT FEMUR PORTABLE 2 VIEWS COMPARISON:  Left femoral radiographs 02/23/2024 FINDINGS: There is diffuse decreased bone mineralization. Interval cephalo medullary nail fixation of the previously seen proximal left femoral intertrochanteric fracture including two trans trochanteric screws. Improved alignment. Persistent fracture line lucency. Mild left femoroacetabular joint space narrowing. Moderate medial and patellofemoral knee compartment joint space narrowing with moderate superior patellar degenerative spurring. No joint effusion. Expected lateral left hip subcutaneous air.  IMPRESSION: Interval cephalomedullary nail fixation of the previously seen proximal left femoral intertrochanteric fracture with improved alignment. Electronically Signed   By: Bertina Broccoli M.D.   On: 02/24/2024 14:38   DG HIP UNILAT WITH PELVIS 2-3 VIEWS LEFT Result Date: 02/24/2024 CLINICAL DATA:  Elective surgery. EXAM: DG HIP (WITH OR WITHOUT PELVIS) 2-3V LEFT COMPARISON:  Preoperative imaging FINDINGS: Eight fluoroscopic spot views of the left hip submitted from the operating room. Femoral intramedullary nail with trans trochanteric and distal locking screw fixation traverse proximal femur fracture. Fluoroscopy time 46 seconds. Dose 8.96 mGy. IMPRESSION: Intraoperative fluoroscopy during proximal femur fracture fixation. Electronically Signed   By: Chadwick Colonel M.D.   On: 02/24/2024 12:36   DG C-Arm 1-60 Min-No Report Result Date: 02/24/2024 Fluoroscopy was utilized by the requesting physician.  No radiographic interpretation.        Scheduled Meds:  docusate sodium   100 mg Oral BID   enoxaparin  (LOVENOX ) injection  40 mg Subcutaneous Q24H   feeding supplement  237 mL Oral BID BM   multivitamin with minerals  1 tablet Oral Daily   Continuous Infusions:   LOS: 3 days   Maylene Spear,  [facility]  If 7PM-7AM, please contact night-coverage [email or web address]  02/26/2024, 9:37 AM

## 2024-02-26 NOTE — Progress Notes (Signed)
 Orthopaedic Trauma Service Progress Note  [name] MRN: [id number] DOB/AGE: 05-11-1931 89 y.o.  Subjective:  Feeling a bit better today  Sat in the chair for several hours yesterday  Has been in chair since about 0930 today   Agreeable to [facility]   ROS As above  Today's  total administered Morphine  Milligram Equivalents: 7.5 Yesterday's total administered Morphine  Milligram Equivalents: 40  Objective:   VITALS:   Vitals:   02/25/24 1928 02/25/24 2325 02/26/24 0355 02/26/24 0752  BP:  122/78 135/66 (!) 143/59  Pulse: 99 98 (!) 106 70  Resp: 18 18 16    Temp:  98.1 F (36.7 C) 98.2 F (36.8 C) 98.3 F (36.8 C)  TempSrc:  Oral Oral Oral  SpO2:  98% 95% 93%  Weight:      Height:        Estimated body mass index is 24.87 kg/m as calculated from the following:   Height as of this encounter: 4\' 10"  (1.473 m).   Weight as of this encounter: 54 kg.   Intake/Output      05/06 0701 05/07 0700 05/07 0701 05/08 0700   P.O. 120    I.V. (mL/kg)     Total Intake(mL/kg) 120 (2.2)    Urine (mL/kg/hr) 700 (0.5)    Blood     Total Output 700    Net -580           LABS  Results for orders placed or performed during the hospital encounter of 02/23/24 (from the past 24 hours)  CBC     Status: Abnormal   Collection Time: 02/26/24  4:45 AM  Result Value Ref Range   WBC 13.1 (H) 4.0 - 10.5 K/uL   RBC 3.55 (L) 3.87 - 5.11 MIL/uL   Hemoglobin 11.3 (L) 12.0 - 15.0 g/dL   HCT 30.8 (L) 65.7 - 84.6 %   MCV 96.3 80.0 - 100.0 fL   MCH 31.8 26.0 - 34.0 pg   MCHC 33.0 30.0 - 36.0 g/dL   RDW 96.2 95.2 - 84.1 %   Platelets 298 150 - 400 K/uL   nRBC 0.0 0.0 - 0.2 %  Basic metabolic panel     Status: Abnormal   Collection Time: 02/26/24  4:45 AM  Result Value Ref Range   Sodium 137 135 - 145 mmol/L   Potassium 3.9 3.5 - 5.1 mmol/L   Chloride 102 98 - 111 mmol/L   CO2 26 22 - 32  mmol/L   Glucose, Bld 111 (H) 70 - 99 mg/dL   BUN 24 (H) 8 - 23 mg/dL   Creatinine, Ser 3.24 0.44 - 1.00 mg/dL   Calcium 9.0 8.9 - 40.1 mg/dL   GFR, Estimated >02 >72 mL/min   Anion gap 9 5 - 15     PHYSICAL EXAM:   Gen: sitting up in char, NAD Lungs: unlabored  Ext:       Left Lower Extremity Dressing is clean, dry and intact, scant strikethrough, no further increase from yesterday              Extremity is warm             No DCT             Compartments are soft  No pain out of proportion with passive stretching of his toes or ankle             DPN, SPN, TN sensory functions are intact             EHL, FHL, lesser toe motor functions intact             Ankle flexion, extension, inversion eversion intact             + DP pulse        Left upper extremity              Shoulder sore over distal clavicle but no acute changes  Assessment/Plan: 2 Days Post-Op   Principal Problem:   Osteoporotic fracture of left hip, initial encounter [facility]) Active Problems:   Cecal cancer (HCC)   S/P right colectomy   Elevated blood pressure reading   Clavicular fracture   Leucocytosis   Anti-infectives (From admission, onward)    Start     Dose/Rate Route Frequency Ordered Stop   02/24/24 1600  ceFAZolin  (ANCEF ) IVPB 2g/100 mL premix        2 g 200 mL/hr over 30 Minutes Intravenous Every 6 hours 02/24/24 1337 02/24/24 2137   02/24/24 1000  ceFAZolin  (ANCEF ) IVPB 2g/100 mL premix        2 g 200 mL/hr over 30 Minutes Intravenous On call to O.R. 02/24/24 0948 02/24/24 1100   02/24/24 0900  ceFAZolin  (ANCEF ) 2-4 GM/100ML-% IVPB       Note to Pharmacy: Franceen Inches: cabinet override      02/24/24 0900 02/24/24 2114     .  POD/HD#: 2  89 y/o female s/p Ground-level fall with left hip fracture   - Left hip fracture s/p IMN Weightbearing Weight-bear as tolerated left leg with assistance               ROM/Activity                         No range of motion  restrictions left hip or knee               Wound care                         Daily wound care starting today, dressing changes as needed               PT and OT evaluation             TOC consult for SNF   -Left distal clavicle fracture             Nonoperative treatment             No formal restrictions             Range of motion as tolerated and can weight-bear as tolerated use a walker             Sling is only for comfort   - Pain management:             Multimodal             Minimize narcotics   - ABL anemia/Hemodynamics             Stable but monitor . - Medical issues              Per primary    - DVT/PE prophylaxis:  Lovenox  and scds  Recommend lovenox  x 30 days at dc while at snf    - ID:              Periop abx completed    - Metabolic Bone Disease:             Vitamin d levels look good             Fracture is a fragility fracture and indicative of osteoporosis              History of R femoral neck fracture and lumbar compression fxs s/p R hip hemiarthroplasty and kyphoplasty    - Activity:             As above   - Impediments to fracture healing:             Age             Poor bone quality    - Dispo:             Therapy evals             TOC consult for SNF   Ortho issues stable    Geroldine Kotyk, PA-C [phone] (C) 02/26/2024, 10:52 AM  [facility] [address] [place] [address] [phone] Deanna [phone] (F)    After 5pm and on the weekends please log on to Amion, go to orthopaedics and the look under the [facility] Call for the provider(s) on call. You can also call our office at [phone] and then follow the prompts to be connected to the call team.  [name], female   DOB: 05-06-1931, 89 y.o.   MRN: [id number]

## 2024-02-26 NOTE — Care Management Important Message (Signed)
 Important Message  Patient Details  Name: Frances Ortiz MRN: [id number] Date of Birth: 23-Oct-1930   Important Message Given:  Yes - Medicare IM     Felix Host 02/26/2024, 4:41 PM

## 2024-02-26 NOTE — Progress Notes (Signed)
 Physical Therapy Treatment Patient Details Name: Frances Ortiz MRN: [id number] DOB: October 15, 1931 Today's Date: 02/26/2024   History of Present Illness Pt is a 89 y.o. female admitted to [facility] via EMS for fall in home. CT showed L Intertrochanteric hip fx and L clavicle fx. Non-op management for L clavicle. IMN of L hip performed 5/5.   PMH: hyperlipidemia, anxiety/depression, cecal cancer s/p R colectomy 2018, GERD, osteoporosis.    PT Comments  Continuing work on functional mobility and activity tolerance;  Session focused on functional transfers, with modest, but present progress; Able to tolerate smoother progression to getting up to EOB, and once sitting, showed better fully upright sitting with less antalgic lean to R; Still needing 2 person Max assist, but able to use RW today for stand pivot transfer; Patient will benefit from continued inpatient follow up therapy, <3 hours/day, and they have a strong preference for [facility]   If plan is discharge home, recommend the following: Two people to help with walking and/or transfers;Two people to help with bathing/dressing/bathroom   Can travel by private vehicle     No  Equipment Recommendations  None recommended by PT    Recommendations for Other Services       Precautions / Restrictions Precautions Precautions: Fall Recall of Precautions/Restrictions: Impaired Required Braces or Orthoses: Sling (for comfort LUE) Restrictions LUE Weight Bearing Per Provider Order: Weight bearing as tolerated LLE Weight Bearing Per Provider Order: Weight bearing as tolerated     Mobility  Bed Mobility Overal bed mobility: Needs Assistance Bed Mobility: Supine to Sit     Supine to sit: +2 for physical assistance, +2 for safety/equipment, Max assist, HOB elevated (slightly elevated)     General bed mobility comments: Good engagement and following commands; Pt pushed with her RUE against rail to move shoulders as we rotated her body  (helicopter-style) to prep for getting up; Painful with elevating trunk to sit, but able to toelrate smoother motion to come to EOB and sitting    Transfers Overall transfer level: Needs assistance Equipment used: Rolling walker (2 wheels) Transfers: Bed to chair/wheelchair/BSC   Stand pivot transfers: +2 physical assistance, Max assist         General transfer comment: Accepted some weight onto LLE during pivot transfer to her less painful R side    Ambulation/Gait                   Stairs             Wheelchair Mobility     Tilt Bed    Modified Rankin (Stroke Patients Only)       Balance Overall balance assessment: Needs assistance Sitting-balance support: Bilateral upper extremity supported, Feet supported Sitting balance-Leahy Scale: Fair   Postural control: Other (comment) (iMPROVED FORM 5/6)   Standing balance-Leahy Scale: Poor                              Communication Communication Communication: No apparent difficulties  Cognition Arousal: Alert Behavior During Therapy: Flat affect, WFL for tasks assessed/performed                           PT - Cognition Comments: More fluent conversation; Oriented x4 Following commands: Intact Following commands impaired: Follows one step commands with increased time    Cueing Cueing Techniques: Verbal cues, Tactile cues  Exercises      General Comments  General comments (skin integrity, edema, etc.): Pt's daughter, Jerlene Moody, present and helpful; she reinforces their prefernce for [facility] for post acute rehab      Pertinent Vitals/Pain Pain Assessment Pain Assessment: Faces Faces Pain Scale: Hurts even more Pain Location: LLE Pain Descriptors / Indicators: Aching, Discomfort, Guarding, Grimacing, Moaning Pain Intervention(s): Limited activity within patient's tolerance    Home Living                          Prior Function            PT Goals (current  goals can now be found in the care plan section) Acute Rehab PT Goals Patient Stated Goal: get back home PT Goal Formulation: With patient Time For Goal Achievement: 02/25/24 Potential to Achieve Goals: Good Progress towards PT goals: Progressing toward goals    Frequency    Min 3X/week      PT Plan      Co-evaluation              AM-PAC PT "6 Clicks" Mobility   Outcome Measure  Help needed turning from your back to your side while in a flat bed without using bedrails?: A Lot Help needed moving from lying on your back to sitting on the side of a flat bed without using bedrails?: Total Help needed moving to and from a bed to a chair (including a wheelchair)?: Total Help needed standing up from a chair using your arms (e.g., wheelchair or bedside chair)?: Total Help needed to walk in hospital room?: Total Help needed climbing 3-5 steps with a railing? : Total 6 Click Score: 7    End of Session Equipment Utilized During Treatment: Gait belt Activity Tolerance: Patient tolerated treatment well Patient left: in chair;with call bell/phone within reach;with chair alarm set;with family/visitor present Nurse Communication: Mobility status PT Visit Diagnosis: Unsteadiness on feet (R26.81);Other abnormalities of gait and mobility (R26.89);Pain;History of falling (Z91.81) Pain - Right/Left: Right Pain - part of body: Leg;Shoulder (Clavicle)     Time: 1610-9604 PT Time Calculation (min) (ACUTE ONLY): 32 min  Charges:    $Therapeutic Activity: 23-37 mins PT General Charges $$ ACUTE PT VISIT: 1 Visit                     Darcus Eastern, PT  Acute Rehabilitation Services Office [phone] Secure Chat welcomed    Marcial Setting 02/26/2024, 11:54 AM

## 2024-02-27 DIAGNOSIS — M80052A Age-related osteoporosis with current pathological fracture, left femur, initial encounter for fracture: Secondary | ICD-10-CM | POA: Diagnosis not present

## 2024-02-27 MED ORDER — ENOXAPARIN SODIUM 40 MG/0.4ML IJ SOSY: 40.0000 mg | PREFILLED_SYRINGE | INTRAMUSCULAR | Status: DC

## 2024-02-27 MED ORDER — OXYCODONE HCL 5 MG PO TABS: 5.0000 mg | ORAL_TABLET | Freq: Four times a day (QID) | ORAL | 0 refills | Status: DC | PRN

## 2024-02-27 MED ORDER — METHOCARBAMOL 500 MG PO TABS: 500.0000 mg | ORAL_TABLET | Freq: Three times a day (TID) | ORAL | Status: DC | PRN

## 2024-02-27 MED ORDER — ENSURE ENLIVE PO LIQD: 237.0000 mL | Freq: Two times a day (BID) | ORAL | Status: DC

## 2024-02-27 MED ORDER — TRAMADOL HCL 50 MG PO TABS: 50.0000 mg | ORAL_TABLET | Freq: Three times a day (TID) | ORAL | 0 refills | Status: DC | PRN

## 2024-02-27 NOTE — Discharge Summary (Signed)
 [facility]  Physician Discharge Summary   [name] MRN: [id number] DOB/AGE: 04-07-1931 89 y.o.  Admit date: 02/23/2024 Discharge date:   02/27/2024   PCP: Tita Form, MD  DISCHARGE DIAGNOSES:    Osteoporotic fracture of left hip, initial encounter (HCC)   Clavicular fracture   Leucocytosis   RECOMMENDATIONS FOR OUTPATIENT FOLLOW UP: Please check CBC and basic metabolic panel in 3 to 4 days Follow-up with Dr. Guyann Leitz with orthopedics   Home Health: SNF Equipment/Devices: None  CODE STATUS: DNR  DISCHARGE CONDITION: fair  Diet recommendation: Regular  INITIAL HISTORY: 89 y.o. female with history of cecal cancer status post resection and in remission had a mechanical fall, intake imaging confirmed left femur and left clavicle fracture.  Hospitalist called for admission, orthopedic surgery called in consult.   Consultations: Orthopedics, Dr. Guyann Leitz  Procedures: ORIF left hip   HOSPITAL COURSE:   Left Hip and left clavicle fracture Mechanical fall with ongoing acute on chronic ambulatory dysfunction Patient was seen by orthopedics.  Underwent L hip IM nail 5/5, tolerated well Sling for L clavicular fracture - conservative management Pain seems to be reasonably well-controlled. Seen by PT and OT.  Needs skilled nursing facility for short-term rehab.   Elevated BP without history of hypertension Secondary to pain - improving with pain control   Leukocytosis Most likely reactive.  Noted to be afebrile.   History of cecal cancer-  S/p resection in remission.   Patient is stable.  Okay for discharge to SNF for short-term rehab.  PERTINENT LABS:  The results of significant diagnostics from this hospitalization (including imaging, microbiology, ancillary and laboratory) are listed below for reference.    Microbiology: Recent Results (from the past 240 hours)  Surgical pcr screen     Status: None   Collection Time: 02/23/24 11:19 PM    Specimen: Nasal Mucosa; Nasal Swab  Result Value Ref Range Status   MRSA, PCR NEGATIVE NEGATIVE Final   Staphylococcus aureus NEGATIVE NEGATIVE Final    Comment: (NOTE) The Xpert SA Assay (FDA approved for NASAL specimens in patients 20 years of age and older), is one component of a comprehensive surveillance program. It is not intended to diagnose infection nor to guide or monitor treatment. Performed at [facility], [address], [place], [place] [address]      Labs:   Basic Metabolic Panel: Recent Labs  Lab 02/23/24 1706 02/24/24 0632 02/25/24 0532 02/26/24 0445  NA 136 135 135 137  K 4.0 3.7 3.5 3.9  CL 100 101 102 102  CO2 23 25 23 26   GLUCOSE 120* 132* 113* 111*  BUN 20 12 13  24*  CREATININE 0.52 0.45 0.48 0.65  CALCIUM 9.0 8.6* 8.4* 9.0    CBC: Recent Labs  Lab 02/23/24 1706 02/24/24 0632 02/25/24 0532 02/26/24 0445  WBC 14.7* 12.5* 11.3* 13.1*  NEUTROABS 12.2*  --   --   --   HGB 13.7 12.4 10.7* 11.3*  HCT 40.9 37.5 31.8* 34.2*  MCV 94.0 94.2 94.9 96.3  PLT 352 293 247 298    IMAGING STUDIES DG FEMUR PORT MIN 2 VIEWS LEFT Result Date: 02/24/2024 CLINICAL DATA:  Fracture.  Status post intramedullary nail. EXAM: LEFT FEMUR PORTABLE 2 VIEWS COMPARISON:  Left femoral radiographs 02/23/2024 FINDINGS: There is diffuse decreased bone mineralization. Interval cephalo medullary nail fixation of the previously seen proximal left femoral intertrochanteric fracture including two trans trochanteric screws. Improved alignment. Persistent fracture line lucency. Mild left femoroacetabular joint  space narrowing. Moderate medial and patellofemoral knee compartment joint space narrowing with moderate superior patellar degenerative spurring. No joint effusion. Expected lateral left hip subcutaneous air. IMPRESSION: Interval cephalomedullary nail fixation of the previously seen proximal left femoral intertrochanteric fracture with improved alignment. Electronically  Signed   By: Bertina Broccoli M.D.   On: 02/24/2024 14:38   DG HIP UNILAT WITH PELVIS 2-3 VIEWS LEFT Result Date: 02/24/2024 CLINICAL DATA:  Elective surgery. EXAM: DG HIP (WITH OR WITHOUT PELVIS) 2-3V LEFT COMPARISON:  Preoperative imaging FINDINGS: Eight fluoroscopic spot views of the left hip submitted from the operating room. Femoral intramedullary nail with trans trochanteric and distal locking screw fixation traverse proximal femur fracture. Fluoroscopy time 46 seconds. Dose 8.96 mGy. IMPRESSION: Intraoperative fluoroscopy during proximal femur fracture fixation. Electronically Signed   By: Chadwick Colonel M.D.   On: 02/24/2024 12:36   DG C-Arm 1-60 Min-No Report Result Date: 02/24/2024 Fluoroscopy was utilized by the requesting physician.  No radiographic interpretation.   DG Shoulder Left Portable Result Date: 02/23/2024 CLINICAL DATA:  Frances Ortiz.  Left shoulder pain EXAM: LEFT SHOULDER COMPARISON:  None Available. FINDINGS: There is a nondisplaced fracture of the distal clavicle. The [facility] joint is intact. The glenohumeral joint is maintained. No definite left-sided rib fractures or pneumothorax. IMPRESSION: Nondisplaced distal left clavicle fracture. Electronically Signed   By: Marrian Siva M.D.   On: 02/23/2024 19:24   DG Pelvis Portable Result Date: 02/23/2024 CLINICAL DATA:  Frances Ortiz.  Left hip pain. EXAM: PORTABLE PELVIS 1-2 VIEWS COMPARISON:  None Available. FINDINGS: Displaced intertrochanteric fracture of the left hip with significant varus deformity. The pubic symphysis and SI joints are intact. No pelvic fractures. Right hip prosthesis is intact. IMPRESSION: Displaced intertrochanteric fracture of the left hip. Electronically Signed   By: Marrian Siva M.D.   On: 02/23/2024 19:23   DG FEMUR MIN 2 VIEWS LEFT Result Date: 02/23/2024 CLINICAL DATA:  Frances Ortiz.  Left leg pain. EXAM: LEFT FEMUR 2 VIEWS COMPARISON:  None Available. FINDINGS: Displaced intertrochanteric fracture of the left hip with significant  varus deformity. The femoral shaft is intact. No left-sided pelvic fractures are identified. IMPRESSION: Displaced intertrochanteric fracture of the left hip. Electronically Signed   By: Marrian Siva M.D.   On: 02/23/2024 19:23   CT HEAD WO CONTRAST Result Date: 02/23/2024 CLINICAL DATA:  Frances Ortiz. EXAM: CT HEAD WITHOUT CONTRAST CT CERVICAL SPINE WITHOUT CONTRAST TECHNIQUE: Multidetector CT imaging of the head and cervical spine was performed following the standard protocol without intravenous contrast. Multiplanar CT image reconstructions of the cervical spine were also generated. RADIATION DOSE REDUCTION: This exam was performed according to the departmental dose-optimization program which includes automated exposure control, adjustment of the mA and/or kV according to patient size and/or use of iterative reconstruction technique. COMPARISON:  Cervical spine CT scan from 06/21/2023 FINDINGS: CT HEAD FINDINGS Brain: Age related cerebral atrophy, ventriculomegaly and periventricular white matter disease. No extra-axial fluid collections are identified. No CT findings for acute hemispheric infarction or intracranial hemorrhage. No mass lesions. The brainstem and cerebellum are normal. Vascular: Vascular calcifications but no aneurysm or hyperdense vessels. Skull: Normal. Negative for fracture or focal lesion. Sinuses/Orbits: The paranasal sinuses and mastoid air cells are clear except for chronic sinusitis involving the right half of the sphenoid sinus. The globes are intact. Other: No scalp lesions or scalp hematoma. CT CERVICAL SPINE FINDINGS Alignment: Normal. Skull base and vertebrae: No acute fracture. No primary bone lesion or focal pathologic process. Stable sclerotic lesion involving the right  aspect of C1 consistent with a benign bone island. Soft tissues and spinal canal: No prevertebral fluid or swelling. No visible canal hematoma. Disc levels: The spinal canal is quite generous. No significant canal  stenosis. Advanced multilevel facet disease, unchanged. Mild multilevel bony foraminal narrowing. Upper chest: The lung apices are grossly clear. Other: Carotid artery calcifications are noted. No neck mass or hematoma. IMPRESSION: 1. Age related cerebral atrophy, ventriculomegaly and periventricular white matter disease. 2. No acute intracranial findings or skull fracture. 3. Chronic right half sphenoid sinusitis. 4. Normal alignment of the cervical vertebral bodies and no acute fracture. 5. Advanced multilevel facet disease. Electronically Signed   By: Marrian Siva M.D.   On: 02/23/2024 19:22   CT CERVICAL SPINE WO CONTRAST Result Date: 02/23/2024 CLINICAL DATA:  Frances Ortiz. EXAM: CT HEAD WITHOUT CONTRAST CT CERVICAL SPINE WITHOUT CONTRAST TECHNIQUE: Multidetector CT imaging of the head and cervical spine was performed following the standard protocol without intravenous contrast. Multiplanar CT image reconstructions of the cervical spine were also generated. RADIATION DOSE REDUCTION: This exam was performed according to the departmental dose-optimization program which includes automated exposure control, adjustment of the mA and/or kV according to patient size and/or use of iterative reconstruction technique. COMPARISON:  Cervical spine CT scan from 06/21/2023 FINDINGS: CT HEAD FINDINGS Brain: Age related cerebral atrophy, ventriculomegaly and periventricular white matter disease. No extra-axial fluid collections are identified. No CT findings for acute hemispheric infarction or intracranial hemorrhage. No mass lesions. The brainstem and cerebellum are normal. Vascular: Vascular calcifications but no aneurysm or hyperdense vessels. Skull: Normal. Negative for fracture or focal lesion. Sinuses/Orbits: The paranasal sinuses and mastoid air cells are clear except for chronic sinusitis involving the right half of the sphenoid sinus. The globes are intact. Other: No scalp lesions or scalp hematoma. CT CERVICAL SPINE  FINDINGS Alignment: Normal. Skull base and vertebrae: No acute fracture. No primary bone lesion or focal pathologic process. Stable sclerotic lesion involving the right aspect of C1 consistent with a benign bone island. Soft tissues and spinal canal: No prevertebral fluid or swelling. No visible canal hematoma. Disc levels: The spinal canal is quite generous. No significant canal stenosis. Advanced multilevel facet disease, unchanged. Mild multilevel bony foraminal narrowing. Upper chest: The lung apices are grossly clear. Other: Carotid artery calcifications are noted. No neck mass or hematoma. IMPRESSION: 1. Age related cerebral atrophy, ventriculomegaly and periventricular white matter disease. 2. No acute intracranial findings or skull fracture. 3. Chronic right half sphenoid sinusitis. 4. Normal alignment of the cervical vertebral bodies and no acute fracture. 5. Advanced multilevel facet disease. Electronically Signed   By: Marrian Siva M.D.   On: 02/23/2024 19:22   DG Chest Port 1 View Result Date: 02/23/2024 CLINICAL DATA:  Follow-up. EXAM: PORTABLE CHEST 1 VIEW COMPARISON:  09/02/2023 FINDINGS: The cardiac silhouette, mediastinal and hilar contours are within normal limits. No acute pulmonary process. No infiltrates, edema, effusions or pneumothorax. The bony thorax is intact. No definite rib fractures. IMPRESSION: No acute cardiopulmonary findings. Electronically Signed   By: Marrian Siva M.D.   On: 02/23/2024 19:16    DISCHARGE EXAMINATION: Vitals:   02/26/24 1628 02/26/24 2051 02/27/24 0413 02/27/24 0810  BP: 124/78 117/72 108/76 124/74  Pulse: (!) 111 (!) 114 (!) 54 (!) 108  Resp:  18 18   Temp: 98.5 F (36.9 C) 97.8 F (36.6 C) 98 F (36.7 C) 98 F (36.7 C)  TempSrc: Axillary Oral Axillary Axillary  SpO2: 95% 96% 94% 100%  Weight:  Height:       General appearance: Awake alert.  In no distress Resp: Clear to auscultation bilaterally.  Normal effort Cardio: S1-S2 is normal  regular.  No S3-S4.  No rubs murmurs or bruit GI: Abdomen is soft.  Nontender nondistended.  Bowel sounds are present normal.  No masses organomegaly   DISPOSITION: SNF  Discharge Instructions     Call MD for:  difficulty breathing, headache or visual disturbances   Complete by: As directed    Call MD for:  extreme fatigue   Complete by: As directed    Call MD for:  persistant dizziness or light-headedness   Complete by: As directed    Call MD for:  persistant nausea and vomiting   Complete by: As directed    Call MD for:  severe uncontrolled pain   Complete by: As directed    Call MD for:  temperature >100.4   Complete by: As directed    Diet general   Complete by: As directed    Discharge instructions   Complete by: As directed    Please review instructions on the discharge summary.  You were cared for by a hospitalist during your hospital stay. If you have any questions about your discharge medications or the care you received while you were in the hospital after you are discharged, you can call the unit and asked to speak with the hospitalist on call if the hospitalist that took care of you is not available. Once you are discharged, your primary care physician will handle any further medical issues. Please note that NO REFILLS for any discharge medications will be authorized once you are discharged, as it is imperative that you return to your primary care physician (or establish a relationship with a primary care physician if you do not have one) for your aftercare needs so that they can reassess your need for medications and monitor your lab values. If you do not have a primary care physician, you can call [phone] for a physician referral.   Increase activity slowly   Complete by: As directed          Allergies as of 02/27/2024       Reactions   Aristocort  [triamcinolone ] Other (See Comments)   Emotional instability   Cortisone Other (See Comments)   Emotional instability    Erythromycin Other (See Comments)   Stomach pain   Hydrocodone -acetaminophen  Rash        Medication List     STOP taking these medications    bisacodyl  10 MG suppository Commonly known as: DULCOLAX   ferrous sulfate  325 (65 FE) MG tablet   metroNIDAZOLE  500 MG tablet Commonly known as: FLAGYL    ondansetron  4 MG disintegrating tablet Commonly known as: ZOFRAN -ODT   pantoprazole  40 MG tablet Commonly known as: PROTONIX        TAKE these medications    acetaminophen  650 MG CR tablet Commonly known as: TYLENOL  Take 650 mg by mouth in the morning, at noon, in the evening, and at bedtime.   docusate sodium  100 MG capsule Commonly known as: COLACE Take 1 capsule (100 mg total) by mouth 2 (two) times daily.   enoxaparin  40 MG/0.4ML injection Commonly known as: LOVENOX  Inject 0.4 mLs (40 mg total) into the skin daily. For 30 days What changed: additional instructions   feeding supplement Liqd Take 237 mLs by mouth 2 (two) times daily between meals.   Metamucil Fiber Chew Chew 3 tablets by mouth daily.   methocarbamol  500  MG tablet Commonly known as: ROBAXIN  Take 1 tablet (500 mg total) by mouth every 8 (eight) hours as needed for muscle spasms. What changed:  when to take this reasons to take this   oxyCODONE  5 MG immediate release tablet Commonly known as: Oxy IR/ROXICODONE  Take 1 tablet (5 mg total) by mouth every 6 (six) hours as needed for severe pain (pain score 7-10).   polyethylene glycol 17 g packet Commonly known as: MIRALAX  / GLYCOLAX  Take 17 g by mouth daily as needed for mild constipation.   sertraline  50 MG tablet Commonly known as: ZOLOFT  Take 50 mg by mouth daily.   traMADol  50 MG tablet Commonly known as: ULTRAM  Take 1 tablet (50 mg total) by mouth every 8 (eight) hours as needed for moderate pain (pain score 4-6).          Follow-up Information     Hardy Lia, MD Follow up in 2 week(s).   Specialty: Orthopedic  Surgery Why: For wound re-check, post hospitalization follow up Contact information: [address] [place] [address] [phone]                 TOTAL DISCHARGE TIME: 35 minutes  Rossanna Spitzley Lyndon Santiago  [facility] Pager on [email or web address]  02/27/2024, 9:10 AM

## 2024-02-27 NOTE — Progress Notes (Signed)
 Report called to [facility] at [facility].  All questions answered.

## 2024-02-27 NOTE — Progress Notes (Signed)
 Speech Language Pathology Treatment: Dysphagia  Patient Details Name: Frances Ortiz MRN: [id number] DOB: 1931/03/02 Today's Date: 02/27/2024 Time: 4098-1191 SLP Time Calculation (min) (ACUTE ONLY): 9 min  Assessment / Plan / Recommendation Clinical Impression  Pt's mentation seems to be more appropriate today, but her positioning is still somewhat limited due to fear of pain. She allowed SLP to elevate HOB only slightly, instructing to stop before it would hurt. Private caregiver at bedside said that when she has been eating and drinking in bed, it has been at about this elevation, so skilled observation was done in this partially reclined posture. Pt ate 1/4 of a tomato sandwich alongside thin liquids via straw with no overt signs of dysphagia or aspiration. She used small bites/sips and a slow rate of pacing, appearing to hold thin liquids briefly in her orally cavity in preparation for swallowing. Although not an ideal position, she seems to be compensating with Mod I. Education was reinforced about general aspiration precautions as well as the role of positioning for PO intake. Discussed trying to time OOB around meal times as much as possible while she is at [facility]. Will leave on regular diet and thin liquids with no further SLP f/u indicated at this time.   HPI HPI: 89 yo female presenting 5/4 with hip pain s/p fall. Pt admitted with L distal clavicle fracture, L hip fracture. IMN of L hip performed 5/5    PMH includes: GERD, HLD, prior R hip replacement, cecal cancer s/p resection, anxiety      SLP Plan  All goals met      Recommendations for follow up therapy are one component of a multi-disciplinary discharge planning process, led by the attending physician.  Recommendations may be updated based on patient status, additional functional criteria and insurance authorization.    Recommendations  Diet recommendations: Regular;Thin liquid Liquids provided via: Cup;Straw Medication  Administration: Whole meds with liquid Supervision: Patient able to self feed Compensations: Slow rate;Small sips/bites;Minimize environmental distractions Postural Changes and/or Swallow Maneuvers: Seated upright 90 degrees                  Oral care BID     Dysphagia, unspecified (R13.10)     All goals met     [name] Brooke., M.A. CCC-SLP Acute Rehabilitation Services Office: [phone]  Secure chat preferred   02/27/2024, 10:11 AM

## 2024-02-27 NOTE — Discharge Instructions (Addendum)
 Orthopaedic Trauma Service Discharge Instructions   General Discharge Instructions  Orthopaedic Injuries:  Left hip fracture treated with intramedullary nailing  WEIGHT BEARING STATUS: Weightbearing as tolerated left leg with assistance such as a walker  RANGE OF MOTION/ACTIVITY: Unrestricted range of motion of left hip and left knee.  Slowly increase activity level  Bone health: Vitamin D levels on this admission are 45.36 ng/mL.  Fracture is suggestive of osteoporosis.  Review the following resource for additional information regarding bone health  [email or web address]  Wound Care: Daily wound care as needed.  Change dressings as needed.  Leave incisions open to the air if there is no drainage.   Discharge Wound Care Instructions  Do NOT apply any ointments, solutions or lotions to pin sites or surgical wounds.  These prevent needed drainage and even though solutions like hydrogen peroxide kill bacteria, they also damage cells lining the pin sites that help fight infection.  Applying lotions or ointments can keep the wounds moist and can cause them to breakdown and open up as well. This can increase the risk for infection. When in doubt call the office.  Surgical incisions should be dressed daily.  If any drainage is noted, use one layer of adaptic or Mepitel, then gauze, and tape.  Alternatively you can use a silicone foam dressing such as a Mepilex  [email or web address].cz [email or web address]://dennis-soto.com/?pd_rd_[email or web address]&th=1  [email or web address].com/  These dressing supplies should be available at local medical supply stores ([employer],  medical, etc). They are not usually carried at places like [facility], [facility], [employer], etc  Once the incision is completely dry and  without drainage, it may be left open to air out.  Showering may begin 36-48 hours later.  Cleaning gently with soap and water.   DVT/PE prophylaxis: Lovenox  40 mg subcutaneous injection daily for 30 days for blood clot prevention  Diet: as you were eating previously.  Can use over the counter stool softeners and bowel preparations, such as Miralax , to help with bowel movements.  Narcotics can be constipating.  Be sure to drink plenty of fluids  PAIN MEDICATION USE AND EXPECTATIONS  You have likely been given narcotic medications to help control your pain.  After a traumatic event that results in an fracture (broken bone) with or without surgery, it is ok to use narcotic pain medications to help control one's pain.  We understand that everyone responds to pain differently and each individual patient will be evaluated on a regular basis for the continued need for narcotic medications. Ideally, narcotic medication use should last no more than 6-8 weeks (coinciding with fracture healing).   As a patient it is your responsibility as well to monitor narcotic medication use and report the amount and frequency you use these medications when you come to your office visit.   We would also advise that if you are using narcotic medications, you should take a dose prior to therapy to maximize you participation.  IF YOU ARE ON NARCOTIC MEDICATIONS IT IS NOT PERMISSIBLE TO OPERATE A MOTOR VEHICLE (MOTORCYCLE/CAR/TRUCK/MOPED) OR HEAVY MACHINERY DO NOT MIX NARCOTICS WITH OTHER CNS (CENTRAL NERVOUS SYSTEM) DEPRESSANTS SUCH AS ALCOHOL   POST-OPERATIVE OPIOID TAPER INSTRUCTIONS: It is important to wean off of your opioid medication as soon as possible. If you do not need pain medication after your surgery it is ok to stop day one. Opioids include: Codeine, Hydrocodone (Norco, Vicodin), Oxycodone (Percocet, oxycontin ) and hydromorphone  amongst others.  Long term and even short term use of opiods can cause: Increased  pain response Dependence  Constipation Depression Respiratory depression And more.  Withdrawal symptoms can include Flu like symptoms Nausea, vomiting And more Techniques to manage these symptoms Hydrate well Eat regular healthy meals Stay active Use relaxation techniques(deep breathing, meditating, yoga) Do Not substitute Alcohol to help with tapering If you have been on opioids for less than two weeks and do not have pain than it is ok to stop all together.  Plan to wean off of opioids This plan should start within one week post op of your fracture surgery  Maintain the same interval or time between taking each dose and first decrease the dose.  Cut the total daily intake of opioids by one tablet each day Next start to increase the time between doses. The last dose that should be eliminated is the evening dose.    STOP SMOKING OR USING NICOTINE PRODUCTS!!!!  As discussed nicotine severely impairs your body's ability to heal surgical and traumatic wounds but also impairs bone healing.  Wounds and bone heal by forming microscopic blood vessels (angiogenesis) and nicotine is a vasoconstrictor (essentially, shrinks blood vessels).  Therefore, if vasoconstriction occurs to these microscopic blood vessels they essentially disappear and are unable to deliver necessary nutrients to the healing tissue.  This is one modifiable factor that you can do to dramatically increase your chances of healing your injury.    (This means no smoking, no nicotine gum, patches, etc)  DO NOT USE NONSTEROIDAL ANTI-INFLAMMATORY DRUGS (NSAID'S)  Using products such as Advil (ibuprofen), Aleve (naproxen), Motrin (ibuprofen) for additional pain control during fracture healing can delay and/or prevent the healing response.  If you would like to take over the counter (OTC) medication, Tylenol  (acetaminophen ) is ok.  However, some narcotic medications that are given for pain control contain acetaminophen  as well.  Therefore, you should not exceed more than 4000 mg of tylenol  in a day if you do not have liver disease.  Also note that there are may OTC medicines, such as cold medicines and allergy medicines that my contain tylenol  as well.  If you have any questions about medications and/or interactions please ask your doctor/PA or your pharmacist.      ICE AND ELEVATE INJURED/OPERATIVE EXTREMITY  Using ice and elevating the injured extremity above your heart can help with swelling and pain control.  Icing in a pulsatile fashion, such as 20 minutes on and 20 minutes off, can be followed.    Do not place ice directly on skin. Make sure there is a barrier between to skin and the ice pack.    Using frozen items such as frozen peas works well as the conform nicely to the are that needs to be iced.  USE AN ACE WRAP OR TED HOSE FOR SWELLING CONTROL  In addition to icing and elevation, Ace wraps or TED hose are used to help limit and resolve swelling.  It is recommended to use Ace wraps or TED hose until you are informed to stop.    When using Ace Wraps start the wrapping distally (farthest away from the body) and wrap proximally (closer to the body)   Example: If you had surgery on your leg and you do not have a splint on, start the ace wrap at the toes and work your way up to the thigh        If you had surgery on your upper extremity and do not have a splint on, start the ace wrap at your fingers and work your way up to the upper  arm  IF YOU ARE IN A SPLINT OR CAST DO NOT REMOVE IT FOR ANY REASON   If your splint gets wet for any reason please contact the office immediately. You may shower in your splint or cast as long as you keep it dry.  This can be done by wrapping in a cast cover or garbage back (or similar)  Do Not stick any thing down your splint or cast such as pencils, money, or hangers to try and scratch yourself with.  If you feel itchy take benadryl as prescribed on the bottle for itching  IF YOU ARE  IN A CAM BOOT (BLACK BOOT)  You may remove boot periodically. Perform daily dressing changes as noted below.  Wash the liner of the boot regularly and wear a sock when wearing the boot. It is recommended that you sleep in the boot until told otherwise    Call office for the following: Temperature greater than 101F Persistent nausea and vomiting Severe uncontrolled pain Redness, tenderness, or signs of infection (pain, swelling, redness, odor or green/yellow discharge around the site) Difficulty breathing, headache or visual disturbances Hives Persistent dizziness or light-headedness Extreme fatigue Any other questions or concerns you may have after discharge  In an emergency, call 911 or go to an Emergency Department at a nearby hospital  HELPFUL INFORMATION  If you had a block, it will wear off between 8-24 hrs postop typically.  This is period when your pain may go from nearly zero to the pain you would have had postop without the block.  This is an abrupt transition but nothing dangerous is happening.  You may take an extra dose of narcotic when this happens.  You should wean off your narcotic medicines as soon as you are able.  Most patients will be off or using minimal narcotics before their first postop appointment.   We suggest you use the pain medication the first night prior to going to bed, in order to ease any pain when the anesthesia wears off. You should avoid taking pain medications on an empty stomach as it will make you nauseous.  Do not drink alcoholic beverages or take illicit drugs when taking pain medications.  In most states it is against the law to drive while you are in a splint or sling.  And certainly against the law to drive while taking narcotics.  You may return to work/school in the next couple of days when you feel up to it.   Pain medication may make you constipated.  Below are a few solutions to try in this order: Decrease the amount of pain medication  if you aren't having pain. Drink lots of decaffeinated fluids. Drink prune juice and/or each dried prunes  If the first 3 don't work start with additional solutions Take Colace - an over-the-counter stool softener Take Senokot - an over-the-counter laxative Take Miralax  - a stronger over-the-counter laxative     CALL THE OFFICE WITH ANY QUESTIONS OR CONCERNS: [phone]   VISIT OUR WEBSITE FOR ADDITIONAL INFORMATION: [email or web address]

## 2024-02-27 NOTE — TOC Transition Note (Signed)
 Transition of Care [facility]) - Discharge Note   Patient Details  Name: Frances Ortiz MRN: [id number] Date of Birth: June 12, 1931  [facility] CM/SW Contact:  Katrinka Parr, LCSW Phone Number: 02/27/2024, 10:40 AM   Clinical Narrative:     SNF auth approved 5/8-5/12. Auth# [id number]  Per MD patient ready for DC to [facility]. RN, patient, patient's family, and facility notified of DC. Discharge Summary and FL2 sent to facility. RN to call report prior to discharge [phone]). DC packet on chart. Ambulance transport requested for patient.   CSW will sign off for now as social work intervention is no longer needed. Please consult us  again if new needs arise.    Final next level of care: Skilled Nursing Facility Barriers to Discharge: No Barriers Identified   Patient Goals and CMS Choice            Discharge Placement              Patient chooses bed at: [facility] Patient to be transferred to facility by: [facility] Name of family member notified: Shaffer,[name] POA/medical POA (Daughter)  [phone] (Mobile) Patient and family notified of of transfer: 02/27/24  Discharge Plan and Services Additional resources added to the After Visit Summary for                                       Social Drivers of Health (SDOH) Interventions SDOH Screenings   Food Insecurity: No Food Insecurity (02/25/2024)  Housing: Low Risk  (02/25/2024)  Transportation Needs: No Transportation Needs (02/25/2024)  Utilities: Not At Risk (02/25/2024)  Social Connections: Moderately Isolated (02/25/2024)  Tobacco Use: Low Risk  (02/24/2024)     Readmission Risk Interventions     No data to display

## 2024-02-27 NOTE — Plan of Care (Signed)
  Problem: Clinical Measurements: Goal: Diagnostic test results will improve Outcome: Progressing Goal: Respiratory complications will improve Outcome: Progressing   Problem: Coping: Goal: Level of anxiety will decrease Outcome: Progressing   Problem: Safety: Goal: Ability to remain free from injury will improve Outcome: Progressing   Problem: Skin Integrity: Goal: Risk for impaired skin integrity will decrease Outcome: Progressing   Problem: Pain Management: Goal: Pain level will decrease Outcome: Progressing

## 2024-02-28 DIAGNOSIS — S42002D Fracture of unspecified part of left clavicle, subsequent encounter for fracture with routine healing: Secondary | ICD-10-CM | POA: Diagnosis not present

## 2024-02-28 DIAGNOSIS — R262 Difficulty in walking, not elsewhere classified: Secondary | ICD-10-CM | POA: Diagnosis not present

## 2024-02-28 DIAGNOSIS — S7292XD Unspecified fracture of left femur, subsequent encounter for closed fracture with routine healing: Secondary | ICD-10-CM | POA: Diagnosis not present

## 2024-02-28 DIAGNOSIS — G8911 Acute pain due to trauma: Secondary | ICD-10-CM | POA: Diagnosis not present

## 2024-03-02 DIAGNOSIS — Z859 Personal history of malignant neoplasm, unspecified: Secondary | ICD-10-CM | POA: Diagnosis not present

## 2024-03-02 DIAGNOSIS — S7292XD Unspecified fracture of left femur, subsequent encounter for closed fracture with routine healing: Secondary | ICD-10-CM | POA: Diagnosis not present

## 2024-03-02 DIAGNOSIS — R262 Difficulty in walking, not elsewhere classified: Secondary | ICD-10-CM | POA: Diagnosis not present

## 2024-03-02 DIAGNOSIS — D72829 Elevated white blood cell count, unspecified: Secondary | ICD-10-CM | POA: Diagnosis not present

## 2024-03-11 DIAGNOSIS — S72142D Displaced intertrochanteric fracture of left femur, subsequent encounter for closed fracture with routine healing: Secondary | ICD-10-CM | POA: Diagnosis not present

## 2024-03-23 DIAGNOSIS — M6281 Muscle weakness (generalized): Secondary | ICD-10-CM | POA: Diagnosis not present

## 2024-03-23 DIAGNOSIS — Z4789 Encounter for other orthopedic aftercare: Secondary | ICD-10-CM | POA: Diagnosis not present

## 2024-03-23 DIAGNOSIS — R2689 Other abnormalities of gait and mobility: Secondary | ICD-10-CM | POA: Diagnosis not present

## 2024-03-24 DIAGNOSIS — M6281 Muscle weakness (generalized): Secondary | ICD-10-CM | POA: Diagnosis not present

## 2024-03-24 DIAGNOSIS — R2689 Other abnormalities of gait and mobility: Secondary | ICD-10-CM | POA: Diagnosis not present

## 2024-03-24 DIAGNOSIS — Z4789 Encounter for other orthopedic aftercare: Secondary | ICD-10-CM | POA: Diagnosis not present

## 2024-03-26 DIAGNOSIS — M6281 Muscle weakness (generalized): Secondary | ICD-10-CM | POA: Diagnosis not present

## 2024-03-26 DIAGNOSIS — Z4789 Encounter for other orthopedic aftercare: Secondary | ICD-10-CM | POA: Diagnosis not present

## 2024-03-26 DIAGNOSIS — R2689 Other abnormalities of gait and mobility: Secondary | ICD-10-CM | POA: Diagnosis not present

## 2024-03-27 DIAGNOSIS — M6281 Muscle weakness (generalized): Secondary | ICD-10-CM | POA: Diagnosis not present

## 2024-03-27 DIAGNOSIS — R2689 Other abnormalities of gait and mobility: Secondary | ICD-10-CM | POA: Diagnosis not present

## 2024-03-27 DIAGNOSIS — Z4789 Encounter for other orthopedic aftercare: Secondary | ICD-10-CM | POA: Diagnosis not present

## 2024-03-30 DIAGNOSIS — R2689 Other abnormalities of gait and mobility: Secondary | ICD-10-CM | POA: Diagnosis not present

## 2024-03-30 DIAGNOSIS — Z4789 Encounter for other orthopedic aftercare: Secondary | ICD-10-CM | POA: Diagnosis not present

## 2024-03-30 DIAGNOSIS — M6281 Muscle weakness (generalized): Secondary | ICD-10-CM | POA: Diagnosis not present

## 2024-03-31 DIAGNOSIS — M6281 Muscle weakness (generalized): Secondary | ICD-10-CM | POA: Diagnosis not present

## 2024-03-31 DIAGNOSIS — R2689 Other abnormalities of gait and mobility: Secondary | ICD-10-CM | POA: Diagnosis not present

## 2024-03-31 DIAGNOSIS — Z4789 Encounter for other orthopedic aftercare: Secondary | ICD-10-CM | POA: Diagnosis not present

## 2024-04-01 DIAGNOSIS — Z4789 Encounter for other orthopedic aftercare: Secondary | ICD-10-CM | POA: Diagnosis not present

## 2024-04-01 DIAGNOSIS — M6281 Muscle weakness (generalized): Secondary | ICD-10-CM | POA: Diagnosis not present

## 2024-04-01 DIAGNOSIS — S42032D Displaced fracture of lateral end of left clavicle, subsequent encounter for fracture with routine healing: Secondary | ICD-10-CM | POA: Diagnosis not present

## 2024-04-01 DIAGNOSIS — R2689 Other abnormalities of gait and mobility: Secondary | ICD-10-CM | POA: Diagnosis not present

## 2024-04-01 DIAGNOSIS — S72142D Displaced intertrochanteric fracture of left femur, subsequent encounter for closed fracture with routine healing: Secondary | ICD-10-CM | POA: Diagnosis not present

## 2024-04-02 DIAGNOSIS — Z4789 Encounter for other orthopedic aftercare: Secondary | ICD-10-CM | POA: Diagnosis not present

## 2024-04-02 DIAGNOSIS — M6281 Muscle weakness (generalized): Secondary | ICD-10-CM | POA: Diagnosis not present

## 2024-04-02 DIAGNOSIS — R2689 Other abnormalities of gait and mobility: Secondary | ICD-10-CM | POA: Diagnosis not present

## 2024-04-03 DIAGNOSIS — M6281 Muscle weakness (generalized): Secondary | ICD-10-CM | POA: Diagnosis not present

## 2024-04-03 DIAGNOSIS — Z4789 Encounter for other orthopedic aftercare: Secondary | ICD-10-CM | POA: Diagnosis not present

## 2024-04-03 DIAGNOSIS — R2689 Other abnormalities of gait and mobility: Secondary | ICD-10-CM | POA: Diagnosis not present

## 2024-04-06 DIAGNOSIS — Z4789 Encounter for other orthopedic aftercare: Secondary | ICD-10-CM | POA: Diagnosis not present

## 2024-04-06 DIAGNOSIS — M6281 Muscle weakness (generalized): Secondary | ICD-10-CM | POA: Diagnosis not present

## 2024-04-06 DIAGNOSIS — R2689 Other abnormalities of gait and mobility: Secondary | ICD-10-CM | POA: Diagnosis not present

## 2024-04-07 DIAGNOSIS — R2689 Other abnormalities of gait and mobility: Secondary | ICD-10-CM | POA: Diagnosis not present

## 2024-04-07 DIAGNOSIS — Z4789 Encounter for other orthopedic aftercare: Secondary | ICD-10-CM | POA: Diagnosis not present

## 2024-04-07 DIAGNOSIS — M6281 Muscle weakness (generalized): Secondary | ICD-10-CM | POA: Diagnosis not present

## 2024-04-18 DIAGNOSIS — G8911 Acute pain due to trauma: Secondary | ICD-10-CM | POA: Diagnosis not present

## 2024-04-18 DIAGNOSIS — M48061 Spinal stenosis, lumbar region without neurogenic claudication: Secondary | ICD-10-CM | POA: Diagnosis not present

## 2024-04-18 DIAGNOSIS — Z85038 Personal history of other malignant neoplasm of large intestine: Secondary | ICD-10-CM | POA: Diagnosis not present

## 2024-04-18 DIAGNOSIS — D72829 Elevated white blood cell count, unspecified: Secondary | ICD-10-CM | POA: Diagnosis not present

## 2024-04-18 DIAGNOSIS — F419 Anxiety disorder, unspecified: Secondary | ICD-10-CM | POA: Diagnosis not present

## 2024-04-18 DIAGNOSIS — E785 Hyperlipidemia, unspecified: Secondary | ICD-10-CM | POA: Diagnosis not present

## 2024-04-18 DIAGNOSIS — F32A Depression, unspecified: Secondary | ICD-10-CM | POA: Diagnosis not present

## 2024-04-18 DIAGNOSIS — K219 Gastro-esophageal reflux disease without esophagitis: Secondary | ICD-10-CM | POA: Diagnosis not present

## 2024-04-18 DIAGNOSIS — I10 Essential (primary) hypertension: Secondary | ICD-10-CM | POA: Diagnosis not present

## 2024-04-18 DIAGNOSIS — Z85820 Personal history of malignant melanoma of skin: Secondary | ICD-10-CM | POA: Diagnosis not present

## 2024-04-18 DIAGNOSIS — M80052D Age-related osteoporosis with current pathological fracture, left femur, subsequent encounter for fracture with routine healing: Secondary | ICD-10-CM | POA: Diagnosis not present

## 2024-04-18 DIAGNOSIS — M80012D Age-related osteoporosis with current pathological fracture, left shoulder, subsequent encounter for fracture with routine healing: Secondary | ICD-10-CM | POA: Diagnosis not present

## 2024-04-18 DIAGNOSIS — Z85828 Personal history of other malignant neoplasm of skin: Secondary | ICD-10-CM | POA: Diagnosis not present

## 2024-04-18 DIAGNOSIS — Z96641 Presence of right artificial hip joint: Secondary | ICD-10-CM | POA: Diagnosis not present

## 2024-04-20 DIAGNOSIS — Z96641 Presence of right artificial hip joint: Secondary | ICD-10-CM | POA: Diagnosis not present

## 2024-04-20 DIAGNOSIS — M48061 Spinal stenosis, lumbar region without neurogenic claudication: Secondary | ICD-10-CM | POA: Diagnosis not present

## 2024-04-20 DIAGNOSIS — M80012D Age-related osteoporosis with current pathological fracture, left shoulder, subsequent encounter for fracture with routine healing: Secondary | ICD-10-CM | POA: Diagnosis not present

## 2024-04-20 DIAGNOSIS — I10 Essential (primary) hypertension: Secondary | ICD-10-CM | POA: Diagnosis not present

## 2024-04-20 DIAGNOSIS — F32A Depression, unspecified: Secondary | ICD-10-CM | POA: Diagnosis not present

## 2024-04-20 DIAGNOSIS — Z85828 Personal history of other malignant neoplasm of skin: Secondary | ICD-10-CM | POA: Diagnosis not present

## 2024-04-20 DIAGNOSIS — K219 Gastro-esophageal reflux disease without esophagitis: Secondary | ICD-10-CM | POA: Diagnosis not present

## 2024-04-20 DIAGNOSIS — Z85038 Personal history of other malignant neoplasm of large intestine: Secondary | ICD-10-CM | POA: Diagnosis not present

## 2024-04-20 DIAGNOSIS — Z85820 Personal history of malignant melanoma of skin: Secondary | ICD-10-CM | POA: Diagnosis not present

## 2024-04-20 DIAGNOSIS — G8911 Acute pain due to trauma: Secondary | ICD-10-CM | POA: Diagnosis not present

## 2024-04-20 DIAGNOSIS — M80052D Age-related osteoporosis with current pathological fracture, left femur, subsequent encounter for fracture with routine healing: Secondary | ICD-10-CM | POA: Diagnosis not present

## 2024-04-20 DIAGNOSIS — E785 Hyperlipidemia, unspecified: Secondary | ICD-10-CM | POA: Diagnosis not present

## 2024-04-20 DIAGNOSIS — F419 Anxiety disorder, unspecified: Secondary | ICD-10-CM | POA: Diagnosis not present

## 2024-04-20 DIAGNOSIS — D72829 Elevated white blood cell count, unspecified: Secondary | ICD-10-CM | POA: Diagnosis not present

## 2024-04-25 DIAGNOSIS — Z85038 Personal history of other malignant neoplasm of large intestine: Secondary | ICD-10-CM | POA: Diagnosis not present

## 2024-04-25 DIAGNOSIS — M48061 Spinal stenosis, lumbar region without neurogenic claudication: Secondary | ICD-10-CM | POA: Diagnosis not present

## 2024-04-25 DIAGNOSIS — Z85828 Personal history of other malignant neoplasm of skin: Secondary | ICD-10-CM | POA: Diagnosis not present

## 2024-04-25 DIAGNOSIS — M80052D Age-related osteoporosis with current pathological fracture, left femur, subsequent encounter for fracture with routine healing: Secondary | ICD-10-CM | POA: Diagnosis not present

## 2024-04-25 DIAGNOSIS — Z85820 Personal history of malignant melanoma of skin: Secondary | ICD-10-CM | POA: Diagnosis not present

## 2024-04-25 DIAGNOSIS — D72829 Elevated white blood cell count, unspecified: Secondary | ICD-10-CM | POA: Diagnosis not present

## 2024-04-25 DIAGNOSIS — Z96641 Presence of right artificial hip joint: Secondary | ICD-10-CM | POA: Diagnosis not present

## 2024-04-25 DIAGNOSIS — G8911 Acute pain due to trauma: Secondary | ICD-10-CM | POA: Diagnosis not present

## 2024-04-25 DIAGNOSIS — M80012D Age-related osteoporosis with current pathological fracture, left shoulder, subsequent encounter for fracture with routine healing: Secondary | ICD-10-CM | POA: Diagnosis not present

## 2024-04-25 DIAGNOSIS — I10 Essential (primary) hypertension: Secondary | ICD-10-CM | POA: Diagnosis not present

## 2024-04-25 DIAGNOSIS — K219 Gastro-esophageal reflux disease without esophagitis: Secondary | ICD-10-CM | POA: Diagnosis not present

## 2024-04-25 DIAGNOSIS — E785 Hyperlipidemia, unspecified: Secondary | ICD-10-CM | POA: Diagnosis not present

## 2024-04-25 DIAGNOSIS — F32A Depression, unspecified: Secondary | ICD-10-CM | POA: Diagnosis not present

## 2024-04-25 DIAGNOSIS — F419 Anxiety disorder, unspecified: Secondary | ICD-10-CM | POA: Diagnosis not present

## 2024-04-27 DIAGNOSIS — F419 Anxiety disorder, unspecified: Secondary | ICD-10-CM | POA: Diagnosis not present

## 2024-04-27 DIAGNOSIS — M80052D Age-related osteoporosis with current pathological fracture, left femur, subsequent encounter for fracture with routine healing: Secondary | ICD-10-CM | POA: Diagnosis not present

## 2024-04-27 DIAGNOSIS — I10 Essential (primary) hypertension: Secondary | ICD-10-CM | POA: Diagnosis not present

## 2024-04-27 DIAGNOSIS — M48061 Spinal stenosis, lumbar region without neurogenic claudication: Secondary | ICD-10-CM | POA: Diagnosis not present

## 2024-04-27 DIAGNOSIS — M80012D Age-related osteoporosis with current pathological fracture, left shoulder, subsequent encounter for fracture with routine healing: Secondary | ICD-10-CM | POA: Diagnosis not present

## 2024-04-27 DIAGNOSIS — D72829 Elevated white blood cell count, unspecified: Secondary | ICD-10-CM | POA: Diagnosis not present

## 2024-04-27 DIAGNOSIS — G8911 Acute pain due to trauma: Secondary | ICD-10-CM | POA: Diagnosis not present

## 2024-04-27 DIAGNOSIS — E785 Hyperlipidemia, unspecified: Secondary | ICD-10-CM | POA: Diagnosis not present

## 2024-04-27 DIAGNOSIS — K219 Gastro-esophageal reflux disease without esophagitis: Secondary | ICD-10-CM | POA: Diagnosis not present

## 2024-04-27 DIAGNOSIS — Z85820 Personal history of malignant melanoma of skin: Secondary | ICD-10-CM | POA: Diagnosis not present

## 2024-04-27 DIAGNOSIS — Z85038 Personal history of other malignant neoplasm of large intestine: Secondary | ICD-10-CM | POA: Diagnosis not present

## 2024-04-27 DIAGNOSIS — Z96641 Presence of right artificial hip joint: Secondary | ICD-10-CM | POA: Diagnosis not present

## 2024-04-27 DIAGNOSIS — Z85828 Personal history of other malignant neoplasm of skin: Secondary | ICD-10-CM | POA: Diagnosis not present

## 2024-04-27 DIAGNOSIS — F32A Depression, unspecified: Secondary | ICD-10-CM | POA: Diagnosis not present

## 2024-04-29 DIAGNOSIS — S72142D Displaced intertrochanteric fracture of left femur, subsequent encounter for closed fracture with routine healing: Secondary | ICD-10-CM | POA: Diagnosis not present

## 2024-04-29 DIAGNOSIS — M80052D Age-related osteoporosis with current pathological fracture, left femur, subsequent encounter for fracture with routine healing: Secondary | ICD-10-CM | POA: Diagnosis not present

## 2024-04-29 DIAGNOSIS — E785 Hyperlipidemia, unspecified: Secondary | ICD-10-CM | POA: Diagnosis not present

## 2024-04-29 DIAGNOSIS — Z85038 Personal history of other malignant neoplasm of large intestine: Secondary | ICD-10-CM | POA: Diagnosis not present

## 2024-04-29 DIAGNOSIS — F32A Depression, unspecified: Secondary | ICD-10-CM | POA: Diagnosis not present

## 2024-04-29 DIAGNOSIS — F419 Anxiety disorder, unspecified: Secondary | ICD-10-CM | POA: Diagnosis not present

## 2024-04-29 DIAGNOSIS — M48061 Spinal stenosis, lumbar region without neurogenic claudication: Secondary | ICD-10-CM | POA: Diagnosis not present

## 2024-04-29 DIAGNOSIS — M80012D Age-related osteoporosis with current pathological fracture, left shoulder, subsequent encounter for fracture with routine healing: Secondary | ICD-10-CM | POA: Diagnosis not present

## 2024-04-29 DIAGNOSIS — Z96641 Presence of right artificial hip joint: Secondary | ICD-10-CM | POA: Diagnosis not present

## 2024-04-29 DIAGNOSIS — M545 Low back pain, unspecified: Secondary | ICD-10-CM | POA: Diagnosis not present

## 2024-04-29 DIAGNOSIS — G8911 Acute pain due to trauma: Secondary | ICD-10-CM | POA: Diagnosis not present

## 2024-04-29 DIAGNOSIS — S42032D Displaced fracture of lateral end of left clavicle, subsequent encounter for fracture with routine healing: Secondary | ICD-10-CM | POA: Diagnosis not present

## 2024-04-29 DIAGNOSIS — Z85820 Personal history of malignant melanoma of skin: Secondary | ICD-10-CM | POA: Diagnosis not present

## 2024-04-29 DIAGNOSIS — Z85828 Personal history of other malignant neoplasm of skin: Secondary | ICD-10-CM | POA: Diagnosis not present

## 2024-04-29 DIAGNOSIS — I10 Essential (primary) hypertension: Secondary | ICD-10-CM | POA: Diagnosis not present

## 2024-04-29 DIAGNOSIS — D72829 Elevated white blood cell count, unspecified: Secondary | ICD-10-CM | POA: Diagnosis not present

## 2024-04-29 DIAGNOSIS — K219 Gastro-esophageal reflux disease without esophagitis: Secondary | ICD-10-CM | POA: Diagnosis not present

## 2024-04-30 DIAGNOSIS — K219 Gastro-esophageal reflux disease without esophagitis: Secondary | ICD-10-CM | POA: Diagnosis not present

## 2024-04-30 DIAGNOSIS — D72829 Elevated white blood cell count, unspecified: Secondary | ICD-10-CM | POA: Diagnosis not present

## 2024-04-30 DIAGNOSIS — F419 Anxiety disorder, unspecified: Secondary | ICD-10-CM | POA: Diagnosis not present

## 2024-04-30 DIAGNOSIS — M80052D Age-related osteoporosis with current pathological fracture, left femur, subsequent encounter for fracture with routine healing: Secondary | ICD-10-CM | POA: Diagnosis not present

## 2024-04-30 DIAGNOSIS — M48061 Spinal stenosis, lumbar region without neurogenic claudication: Secondary | ICD-10-CM | POA: Diagnosis not present

## 2024-04-30 DIAGNOSIS — S72142D Displaced intertrochanteric fracture of left femur, subsequent encounter for closed fracture with routine healing: Secondary | ICD-10-CM | POA: Diagnosis not present

## 2024-04-30 DIAGNOSIS — Z85820 Personal history of malignant melanoma of skin: Secondary | ICD-10-CM | POA: Diagnosis not present

## 2024-04-30 DIAGNOSIS — Z85038 Personal history of other malignant neoplasm of large intestine: Secondary | ICD-10-CM | POA: Diagnosis not present

## 2024-04-30 DIAGNOSIS — S22080G Wedge compression fracture of T11-T12 vertebra, subsequent encounter for fracture with delayed healing: Secondary | ICD-10-CM | POA: Diagnosis not present

## 2024-04-30 DIAGNOSIS — S42002D Fracture of unspecified part of left clavicle, subsequent encounter for fracture with routine healing: Secondary | ICD-10-CM | POA: Diagnosis not present

## 2024-04-30 DIAGNOSIS — I10 Essential (primary) hypertension: Secondary | ICD-10-CM | POA: Diagnosis not present

## 2024-04-30 DIAGNOSIS — F32A Depression, unspecified: Secondary | ICD-10-CM | POA: Diagnosis not present

## 2024-04-30 DIAGNOSIS — Z85828 Personal history of other malignant neoplasm of skin: Secondary | ICD-10-CM | POA: Diagnosis not present

## 2024-04-30 DIAGNOSIS — Z96641 Presence of right artificial hip joint: Secondary | ICD-10-CM | POA: Diagnosis not present

## 2024-04-30 DIAGNOSIS — R197 Diarrhea, unspecified: Secondary | ICD-10-CM | POA: Diagnosis not present

## 2024-04-30 DIAGNOSIS — E785 Hyperlipidemia, unspecified: Secondary | ICD-10-CM | POA: Diagnosis not present

## 2024-04-30 DIAGNOSIS — G8911 Acute pain due to trauma: Secondary | ICD-10-CM | POA: Diagnosis not present

## 2024-04-30 DIAGNOSIS — M80012D Age-related osteoporosis with current pathological fracture, left shoulder, subsequent encounter for fracture with routine healing: Secondary | ICD-10-CM | POA: Diagnosis not present

## 2024-05-01 DIAGNOSIS — M80012D Age-related osteoporosis with current pathological fracture, left shoulder, subsequent encounter for fracture with routine healing: Secondary | ICD-10-CM | POA: Diagnosis not present

## 2024-05-01 DIAGNOSIS — F419 Anxiety disorder, unspecified: Secondary | ICD-10-CM | POA: Diagnosis not present

## 2024-05-01 DIAGNOSIS — G8911 Acute pain due to trauma: Secondary | ICD-10-CM | POA: Diagnosis not present

## 2024-05-01 DIAGNOSIS — Z85820 Personal history of malignant melanoma of skin: Secondary | ICD-10-CM | POA: Diagnosis not present

## 2024-05-01 DIAGNOSIS — Z96641 Presence of right artificial hip joint: Secondary | ICD-10-CM | POA: Diagnosis not present

## 2024-05-01 DIAGNOSIS — I10 Essential (primary) hypertension: Secondary | ICD-10-CM | POA: Diagnosis not present

## 2024-05-01 DIAGNOSIS — M48061 Spinal stenosis, lumbar region without neurogenic claudication: Secondary | ICD-10-CM | POA: Diagnosis not present

## 2024-05-01 DIAGNOSIS — M80052D Age-related osteoporosis with current pathological fracture, left femur, subsequent encounter for fracture with routine healing: Secondary | ICD-10-CM | POA: Diagnosis not present

## 2024-05-01 DIAGNOSIS — E785 Hyperlipidemia, unspecified: Secondary | ICD-10-CM | POA: Diagnosis not present

## 2024-05-01 DIAGNOSIS — Z85828 Personal history of other malignant neoplasm of skin: Secondary | ICD-10-CM | POA: Diagnosis not present

## 2024-05-01 DIAGNOSIS — K219 Gastro-esophageal reflux disease without esophagitis: Secondary | ICD-10-CM | POA: Diagnosis not present

## 2024-05-01 DIAGNOSIS — F32A Depression, unspecified: Secondary | ICD-10-CM | POA: Diagnosis not present

## 2024-05-01 DIAGNOSIS — Z85038 Personal history of other malignant neoplasm of large intestine: Secondary | ICD-10-CM | POA: Diagnosis not present

## 2024-05-01 DIAGNOSIS — D72829 Elevated white blood cell count, unspecified: Secondary | ICD-10-CM | POA: Diagnosis not present

## 2024-05-04 DIAGNOSIS — I10 Essential (primary) hypertension: Secondary | ICD-10-CM | POA: Diagnosis not present

## 2024-05-04 DIAGNOSIS — D72829 Elevated white blood cell count, unspecified: Secondary | ICD-10-CM | POA: Diagnosis not present

## 2024-05-04 DIAGNOSIS — Z85038 Personal history of other malignant neoplasm of large intestine: Secondary | ICD-10-CM | POA: Diagnosis not present

## 2024-05-04 DIAGNOSIS — E785 Hyperlipidemia, unspecified: Secondary | ICD-10-CM | POA: Diagnosis not present

## 2024-05-04 DIAGNOSIS — F419 Anxiety disorder, unspecified: Secondary | ICD-10-CM | POA: Diagnosis not present

## 2024-05-04 DIAGNOSIS — K219 Gastro-esophageal reflux disease without esophagitis: Secondary | ICD-10-CM | POA: Diagnosis not present

## 2024-05-04 DIAGNOSIS — M80052D Age-related osteoporosis with current pathological fracture, left femur, subsequent encounter for fracture with routine healing: Secondary | ICD-10-CM | POA: Diagnosis not present

## 2024-05-04 DIAGNOSIS — M48061 Spinal stenosis, lumbar region without neurogenic claudication: Secondary | ICD-10-CM | POA: Diagnosis not present

## 2024-05-04 DIAGNOSIS — G8911 Acute pain due to trauma: Secondary | ICD-10-CM | POA: Diagnosis not present

## 2024-05-04 DIAGNOSIS — F32A Depression, unspecified: Secondary | ICD-10-CM | POA: Diagnosis not present

## 2024-05-04 DIAGNOSIS — Z96641 Presence of right artificial hip joint: Secondary | ICD-10-CM | POA: Diagnosis not present

## 2024-05-04 DIAGNOSIS — M80012D Age-related osteoporosis with current pathological fracture, left shoulder, subsequent encounter for fracture with routine healing: Secondary | ICD-10-CM | POA: Diagnosis not present

## 2024-05-04 DIAGNOSIS — Z85820 Personal history of malignant melanoma of skin: Secondary | ICD-10-CM | POA: Diagnosis not present

## 2024-05-04 DIAGNOSIS — Z85828 Personal history of other malignant neoplasm of skin: Secondary | ICD-10-CM | POA: Diagnosis not present

## 2024-05-05 DIAGNOSIS — Z85820 Personal history of malignant melanoma of skin: Secondary | ICD-10-CM | POA: Diagnosis not present

## 2024-05-05 DIAGNOSIS — F419 Anxiety disorder, unspecified: Secondary | ICD-10-CM | POA: Diagnosis not present

## 2024-05-05 DIAGNOSIS — M48061 Spinal stenosis, lumbar region without neurogenic claudication: Secondary | ICD-10-CM | POA: Diagnosis not present

## 2024-05-05 DIAGNOSIS — F32A Depression, unspecified: Secondary | ICD-10-CM | POA: Diagnosis not present

## 2024-05-05 DIAGNOSIS — I10 Essential (primary) hypertension: Secondary | ICD-10-CM | POA: Diagnosis not present

## 2024-05-05 DIAGNOSIS — Z85038 Personal history of other malignant neoplasm of large intestine: Secondary | ICD-10-CM | POA: Diagnosis not present

## 2024-05-05 DIAGNOSIS — E785 Hyperlipidemia, unspecified: Secondary | ICD-10-CM | POA: Diagnosis not present

## 2024-05-05 DIAGNOSIS — D72829 Elevated white blood cell count, unspecified: Secondary | ICD-10-CM | POA: Diagnosis not present

## 2024-05-05 DIAGNOSIS — G8911 Acute pain due to trauma: Secondary | ICD-10-CM | POA: Diagnosis not present

## 2024-05-05 DIAGNOSIS — M80052D Age-related osteoporosis with current pathological fracture, left femur, subsequent encounter for fracture with routine healing: Secondary | ICD-10-CM | POA: Diagnosis not present

## 2024-05-05 DIAGNOSIS — Z96641 Presence of right artificial hip joint: Secondary | ICD-10-CM | POA: Diagnosis not present

## 2024-05-05 DIAGNOSIS — M80012D Age-related osteoporosis with current pathological fracture, left shoulder, subsequent encounter for fracture with routine healing: Secondary | ICD-10-CM | POA: Diagnosis not present

## 2024-05-05 DIAGNOSIS — Z85828 Personal history of other malignant neoplasm of skin: Secondary | ICD-10-CM | POA: Diagnosis not present

## 2024-05-05 DIAGNOSIS — K219 Gastro-esophageal reflux disease without esophagitis: Secondary | ICD-10-CM | POA: Diagnosis not present

## 2024-05-06 DIAGNOSIS — F32A Depression, unspecified: Secondary | ICD-10-CM | POA: Diagnosis not present

## 2024-05-06 DIAGNOSIS — D72829 Elevated white blood cell count, unspecified: Secondary | ICD-10-CM | POA: Diagnosis not present

## 2024-05-06 DIAGNOSIS — F419 Anxiety disorder, unspecified: Secondary | ICD-10-CM | POA: Diagnosis not present

## 2024-05-06 DIAGNOSIS — K219 Gastro-esophageal reflux disease without esophagitis: Secondary | ICD-10-CM | POA: Diagnosis not present

## 2024-05-06 DIAGNOSIS — G8911 Acute pain due to trauma: Secondary | ICD-10-CM | POA: Diagnosis not present

## 2024-05-06 DIAGNOSIS — M80052D Age-related osteoporosis with current pathological fracture, left femur, subsequent encounter for fracture with routine healing: Secondary | ICD-10-CM | POA: Diagnosis not present

## 2024-05-06 DIAGNOSIS — Z85038 Personal history of other malignant neoplasm of large intestine: Secondary | ICD-10-CM | POA: Diagnosis not present

## 2024-05-06 DIAGNOSIS — E785 Hyperlipidemia, unspecified: Secondary | ICD-10-CM | POA: Diagnosis not present

## 2024-05-06 DIAGNOSIS — Z85820 Personal history of malignant melanoma of skin: Secondary | ICD-10-CM | POA: Diagnosis not present

## 2024-05-06 DIAGNOSIS — I10 Essential (primary) hypertension: Secondary | ICD-10-CM | POA: Diagnosis not present

## 2024-05-06 DIAGNOSIS — M80012D Age-related osteoporosis with current pathological fracture, left shoulder, subsequent encounter for fracture with routine healing: Secondary | ICD-10-CM | POA: Diagnosis not present

## 2024-05-06 DIAGNOSIS — Z85828 Personal history of other malignant neoplasm of skin: Secondary | ICD-10-CM | POA: Diagnosis not present

## 2024-05-06 DIAGNOSIS — M48061 Spinal stenosis, lumbar region without neurogenic claudication: Secondary | ICD-10-CM | POA: Diagnosis not present

## 2024-05-06 DIAGNOSIS — Z96641 Presence of right artificial hip joint: Secondary | ICD-10-CM | POA: Diagnosis not present

## 2024-05-08 DIAGNOSIS — G8911 Acute pain due to trauma: Secondary | ICD-10-CM | POA: Diagnosis not present

## 2024-05-08 DIAGNOSIS — Z85828 Personal history of other malignant neoplasm of skin: Secondary | ICD-10-CM | POA: Diagnosis not present

## 2024-05-08 DIAGNOSIS — D72829 Elevated white blood cell count, unspecified: Secondary | ICD-10-CM | POA: Diagnosis not present

## 2024-05-08 DIAGNOSIS — M48061 Spinal stenosis, lumbar region without neurogenic claudication: Secondary | ICD-10-CM | POA: Diagnosis not present

## 2024-05-08 DIAGNOSIS — M80012D Age-related osteoporosis with current pathological fracture, left shoulder, subsequent encounter for fracture with routine healing: Secondary | ICD-10-CM | POA: Diagnosis not present

## 2024-05-08 DIAGNOSIS — E785 Hyperlipidemia, unspecified: Secondary | ICD-10-CM | POA: Diagnosis not present

## 2024-05-08 DIAGNOSIS — Z85820 Personal history of malignant melanoma of skin: Secondary | ICD-10-CM | POA: Diagnosis not present

## 2024-05-08 DIAGNOSIS — I10 Essential (primary) hypertension: Secondary | ICD-10-CM | POA: Diagnosis not present

## 2024-05-08 DIAGNOSIS — K219 Gastro-esophageal reflux disease without esophagitis: Secondary | ICD-10-CM | POA: Diagnosis not present

## 2024-05-08 DIAGNOSIS — Z96641 Presence of right artificial hip joint: Secondary | ICD-10-CM | POA: Diagnosis not present

## 2024-05-08 DIAGNOSIS — F32A Depression, unspecified: Secondary | ICD-10-CM | POA: Diagnosis not present

## 2024-05-08 DIAGNOSIS — Z85038 Personal history of other malignant neoplasm of large intestine: Secondary | ICD-10-CM | POA: Diagnosis not present

## 2024-05-08 DIAGNOSIS — M80052D Age-related osteoporosis with current pathological fracture, left femur, subsequent encounter for fracture with routine healing: Secondary | ICD-10-CM | POA: Diagnosis not present

## 2024-05-08 DIAGNOSIS — F419 Anxiety disorder, unspecified: Secondary | ICD-10-CM | POA: Diagnosis not present

## 2024-05-11 DIAGNOSIS — Z85820 Personal history of malignant melanoma of skin: Secondary | ICD-10-CM | POA: Diagnosis not present

## 2024-05-11 DIAGNOSIS — Z85828 Personal history of other malignant neoplasm of skin: Secondary | ICD-10-CM | POA: Diagnosis not present

## 2024-05-11 DIAGNOSIS — G8911 Acute pain due to trauma: Secondary | ICD-10-CM | POA: Diagnosis not present

## 2024-05-11 DIAGNOSIS — Z85038 Personal history of other malignant neoplasm of large intestine: Secondary | ICD-10-CM | POA: Diagnosis not present

## 2024-05-11 DIAGNOSIS — K219 Gastro-esophageal reflux disease without esophagitis: Secondary | ICD-10-CM | POA: Diagnosis not present

## 2024-05-11 DIAGNOSIS — D72829 Elevated white blood cell count, unspecified: Secondary | ICD-10-CM | POA: Diagnosis not present

## 2024-05-11 DIAGNOSIS — I10 Essential (primary) hypertension: Secondary | ICD-10-CM | POA: Diagnosis not present

## 2024-05-11 DIAGNOSIS — M48061 Spinal stenosis, lumbar region without neurogenic claudication: Secondary | ICD-10-CM | POA: Diagnosis not present

## 2024-05-11 DIAGNOSIS — F419 Anxiety disorder, unspecified: Secondary | ICD-10-CM | POA: Diagnosis not present

## 2024-05-11 DIAGNOSIS — M80052D Age-related osteoporosis with current pathological fracture, left femur, subsequent encounter for fracture with routine healing: Secondary | ICD-10-CM | POA: Diagnosis not present

## 2024-05-11 DIAGNOSIS — M80012D Age-related osteoporosis with current pathological fracture, left shoulder, subsequent encounter for fracture with routine healing: Secondary | ICD-10-CM | POA: Diagnosis not present

## 2024-05-11 DIAGNOSIS — F32A Depression, unspecified: Secondary | ICD-10-CM | POA: Diagnosis not present

## 2024-05-11 DIAGNOSIS — E785 Hyperlipidemia, unspecified: Secondary | ICD-10-CM | POA: Diagnosis not present

## 2024-05-11 DIAGNOSIS — Z96641 Presence of right artificial hip joint: Secondary | ICD-10-CM | POA: Diagnosis not present

## 2024-05-12 DIAGNOSIS — M80052D Age-related osteoporosis with current pathological fracture, left femur, subsequent encounter for fracture with routine healing: Secondary | ICD-10-CM | POA: Diagnosis not present

## 2024-05-12 DIAGNOSIS — Z96641 Presence of right artificial hip joint: Secondary | ICD-10-CM | POA: Diagnosis not present

## 2024-05-12 DIAGNOSIS — K219 Gastro-esophageal reflux disease without esophagitis: Secondary | ICD-10-CM | POA: Diagnosis not present

## 2024-05-12 DIAGNOSIS — E785 Hyperlipidemia, unspecified: Secondary | ICD-10-CM | POA: Diagnosis not present

## 2024-05-12 DIAGNOSIS — G8911 Acute pain due to trauma: Secondary | ICD-10-CM | POA: Diagnosis not present

## 2024-05-12 DIAGNOSIS — I10 Essential (primary) hypertension: Secondary | ICD-10-CM | POA: Diagnosis not present

## 2024-05-12 DIAGNOSIS — F419 Anxiety disorder, unspecified: Secondary | ICD-10-CM | POA: Diagnosis not present

## 2024-05-12 DIAGNOSIS — Z85828 Personal history of other malignant neoplasm of skin: Secondary | ICD-10-CM | POA: Diagnosis not present

## 2024-05-12 DIAGNOSIS — M48061 Spinal stenosis, lumbar region without neurogenic claudication: Secondary | ICD-10-CM | POA: Diagnosis not present

## 2024-05-12 DIAGNOSIS — F32A Depression, unspecified: Secondary | ICD-10-CM | POA: Diagnosis not present

## 2024-05-12 DIAGNOSIS — M80012D Age-related osteoporosis with current pathological fracture, left shoulder, subsequent encounter for fracture with routine healing: Secondary | ICD-10-CM | POA: Diagnosis not present

## 2024-05-12 DIAGNOSIS — Z85038 Personal history of other malignant neoplasm of large intestine: Secondary | ICD-10-CM | POA: Diagnosis not present

## 2024-05-12 DIAGNOSIS — D72829 Elevated white blood cell count, unspecified: Secondary | ICD-10-CM | POA: Diagnosis not present

## 2024-05-12 DIAGNOSIS — Z85820 Personal history of malignant melanoma of skin: Secondary | ICD-10-CM | POA: Diagnosis not present

## 2024-05-13 DIAGNOSIS — M80052D Age-related osteoporosis with current pathological fracture, left femur, subsequent encounter for fracture with routine healing: Secondary | ICD-10-CM | POA: Diagnosis not present

## 2024-05-13 DIAGNOSIS — G8911 Acute pain due to trauma: Secondary | ICD-10-CM | POA: Diagnosis not present

## 2024-05-13 DIAGNOSIS — Z85038 Personal history of other malignant neoplasm of large intestine: Secondary | ICD-10-CM | POA: Diagnosis not present

## 2024-05-13 DIAGNOSIS — M80012D Age-related osteoporosis with current pathological fracture, left shoulder, subsequent encounter for fracture with routine healing: Secondary | ICD-10-CM | POA: Diagnosis not present

## 2024-05-13 DIAGNOSIS — Z96641 Presence of right artificial hip joint: Secondary | ICD-10-CM | POA: Diagnosis not present

## 2024-05-13 DIAGNOSIS — F419 Anxiety disorder, unspecified: Secondary | ICD-10-CM | POA: Diagnosis not present

## 2024-05-13 DIAGNOSIS — E785 Hyperlipidemia, unspecified: Secondary | ICD-10-CM | POA: Diagnosis not present

## 2024-05-13 DIAGNOSIS — Z85828 Personal history of other malignant neoplasm of skin: Secondary | ICD-10-CM | POA: Diagnosis not present

## 2024-05-13 DIAGNOSIS — I10 Essential (primary) hypertension: Secondary | ICD-10-CM | POA: Diagnosis not present

## 2024-05-13 DIAGNOSIS — F32A Depression, unspecified: Secondary | ICD-10-CM | POA: Diagnosis not present

## 2024-05-13 DIAGNOSIS — D72829 Elevated white blood cell count, unspecified: Secondary | ICD-10-CM | POA: Diagnosis not present

## 2024-05-13 DIAGNOSIS — M48061 Spinal stenosis, lumbar region without neurogenic claudication: Secondary | ICD-10-CM | POA: Diagnosis not present

## 2024-05-13 DIAGNOSIS — Z85820 Personal history of malignant melanoma of skin: Secondary | ICD-10-CM | POA: Diagnosis not present

## 2024-05-13 DIAGNOSIS — K219 Gastro-esophageal reflux disease without esophagitis: Secondary | ICD-10-CM | POA: Diagnosis not present

## 2024-05-18 DIAGNOSIS — G8911 Acute pain due to trauma: Secondary | ICD-10-CM | POA: Diagnosis not present

## 2024-05-18 DIAGNOSIS — K219 Gastro-esophageal reflux disease without esophagitis: Secondary | ICD-10-CM | POA: Diagnosis not present

## 2024-05-18 DIAGNOSIS — M80052D Age-related osteoporosis with current pathological fracture, left femur, subsequent encounter for fracture with routine healing: Secondary | ICD-10-CM | POA: Diagnosis not present

## 2024-05-18 DIAGNOSIS — D72829 Elevated white blood cell count, unspecified: Secondary | ICD-10-CM | POA: Diagnosis not present

## 2024-05-18 DIAGNOSIS — E785 Hyperlipidemia, unspecified: Secondary | ICD-10-CM | POA: Diagnosis not present

## 2024-05-18 DIAGNOSIS — Z85820 Personal history of malignant melanoma of skin: Secondary | ICD-10-CM | POA: Diagnosis not present

## 2024-05-18 DIAGNOSIS — Z96641 Presence of right artificial hip joint: Secondary | ICD-10-CM | POA: Diagnosis not present

## 2024-05-18 DIAGNOSIS — M80012D Age-related osteoporosis with current pathological fracture, left shoulder, subsequent encounter for fracture with routine healing: Secondary | ICD-10-CM | POA: Diagnosis not present

## 2024-05-18 DIAGNOSIS — M48061 Spinal stenosis, lumbar region without neurogenic claudication: Secondary | ICD-10-CM | POA: Diagnosis not present

## 2024-05-18 DIAGNOSIS — Z85038 Personal history of other malignant neoplasm of large intestine: Secondary | ICD-10-CM | POA: Diagnosis not present

## 2024-05-18 DIAGNOSIS — F419 Anxiety disorder, unspecified: Secondary | ICD-10-CM | POA: Diagnosis not present

## 2024-05-18 DIAGNOSIS — Z85828 Personal history of other malignant neoplasm of skin: Secondary | ICD-10-CM | POA: Diagnosis not present

## 2024-05-18 DIAGNOSIS — F32A Depression, unspecified: Secondary | ICD-10-CM | POA: Diagnosis not present

## 2024-05-18 DIAGNOSIS — I10 Essential (primary) hypertension: Secondary | ICD-10-CM | POA: Diagnosis not present

## 2024-05-19 DIAGNOSIS — F32A Depression, unspecified: Secondary | ICD-10-CM | POA: Diagnosis not present

## 2024-05-19 DIAGNOSIS — I10 Essential (primary) hypertension: Secondary | ICD-10-CM | POA: Diagnosis not present

## 2024-05-19 DIAGNOSIS — Z85828 Personal history of other malignant neoplasm of skin: Secondary | ICD-10-CM | POA: Diagnosis not present

## 2024-05-19 DIAGNOSIS — M48061 Spinal stenosis, lumbar region without neurogenic claudication: Secondary | ICD-10-CM | POA: Diagnosis not present

## 2024-05-19 DIAGNOSIS — Z85820 Personal history of malignant melanoma of skin: Secondary | ICD-10-CM | POA: Diagnosis not present

## 2024-05-19 DIAGNOSIS — M80052D Age-related osteoporosis with current pathological fracture, left femur, subsequent encounter for fracture with routine healing: Secondary | ICD-10-CM | POA: Diagnosis not present

## 2024-05-19 DIAGNOSIS — D72829 Elevated white blood cell count, unspecified: Secondary | ICD-10-CM | POA: Diagnosis not present

## 2024-05-19 DIAGNOSIS — G8911 Acute pain due to trauma: Secondary | ICD-10-CM | POA: Diagnosis not present

## 2024-05-19 DIAGNOSIS — F419 Anxiety disorder, unspecified: Secondary | ICD-10-CM | POA: Diagnosis not present

## 2024-05-19 DIAGNOSIS — K219 Gastro-esophageal reflux disease without esophagitis: Secondary | ICD-10-CM | POA: Diagnosis not present

## 2024-05-19 DIAGNOSIS — E785 Hyperlipidemia, unspecified: Secondary | ICD-10-CM | POA: Diagnosis not present

## 2024-05-19 DIAGNOSIS — Z85038 Personal history of other malignant neoplasm of large intestine: Secondary | ICD-10-CM | POA: Diagnosis not present

## 2024-05-19 DIAGNOSIS — M80012D Age-related osteoporosis with current pathological fracture, left shoulder, subsequent encounter for fracture with routine healing: Secondary | ICD-10-CM | POA: Diagnosis not present

## 2024-05-19 DIAGNOSIS — Z96641 Presence of right artificial hip joint: Secondary | ICD-10-CM | POA: Diagnosis not present

## 2024-05-21 DIAGNOSIS — I10 Essential (primary) hypertension: Secondary | ICD-10-CM | POA: Diagnosis not present

## 2024-05-21 DIAGNOSIS — F32A Depression, unspecified: Secondary | ICD-10-CM | POA: Diagnosis not present

## 2024-05-21 DIAGNOSIS — F419 Anxiety disorder, unspecified: Secondary | ICD-10-CM | POA: Diagnosis not present

## 2024-05-21 DIAGNOSIS — M80052D Age-related osteoporosis with current pathological fracture, left femur, subsequent encounter for fracture with routine healing: Secondary | ICD-10-CM | POA: Diagnosis not present

## 2024-05-21 DIAGNOSIS — E785 Hyperlipidemia, unspecified: Secondary | ICD-10-CM | POA: Diagnosis not present

## 2024-05-21 DIAGNOSIS — Z85038 Personal history of other malignant neoplasm of large intestine: Secondary | ICD-10-CM | POA: Diagnosis not present

## 2024-05-21 DIAGNOSIS — Z85828 Personal history of other malignant neoplasm of skin: Secondary | ICD-10-CM | POA: Diagnosis not present

## 2024-05-21 DIAGNOSIS — M48061 Spinal stenosis, lumbar region without neurogenic claudication: Secondary | ICD-10-CM | POA: Diagnosis not present

## 2024-05-21 DIAGNOSIS — Z96641 Presence of right artificial hip joint: Secondary | ICD-10-CM | POA: Diagnosis not present

## 2024-05-21 DIAGNOSIS — K219 Gastro-esophageal reflux disease without esophagitis: Secondary | ICD-10-CM | POA: Diagnosis not present

## 2024-05-21 DIAGNOSIS — M80012D Age-related osteoporosis with current pathological fracture, left shoulder, subsequent encounter for fracture with routine healing: Secondary | ICD-10-CM | POA: Diagnosis not present

## 2024-05-21 DIAGNOSIS — Z85820 Personal history of malignant melanoma of skin: Secondary | ICD-10-CM | POA: Diagnosis not present

## 2024-05-21 DIAGNOSIS — D72829 Elevated white blood cell count, unspecified: Secondary | ICD-10-CM | POA: Diagnosis not present

## 2024-05-21 DIAGNOSIS — G8911 Acute pain due to trauma: Secondary | ICD-10-CM | POA: Diagnosis not present

## 2024-05-25 DIAGNOSIS — M80012D Age-related osteoporosis with current pathological fracture, left shoulder, subsequent encounter for fracture with routine healing: Secondary | ICD-10-CM | POA: Diagnosis not present

## 2024-05-25 DIAGNOSIS — M80052D Age-related osteoporosis with current pathological fracture, left femur, subsequent encounter for fracture with routine healing: Secondary | ICD-10-CM | POA: Diagnosis not present

## 2024-05-25 DIAGNOSIS — Z85828 Personal history of other malignant neoplasm of skin: Secondary | ICD-10-CM | POA: Diagnosis not present

## 2024-05-25 DIAGNOSIS — Z96641 Presence of right artificial hip joint: Secondary | ICD-10-CM | POA: Diagnosis not present

## 2024-05-25 DIAGNOSIS — I10 Essential (primary) hypertension: Secondary | ICD-10-CM | POA: Diagnosis not present

## 2024-05-25 DIAGNOSIS — D72829 Elevated white blood cell count, unspecified: Secondary | ICD-10-CM | POA: Diagnosis not present

## 2024-05-25 DIAGNOSIS — E785 Hyperlipidemia, unspecified: Secondary | ICD-10-CM | POA: Diagnosis not present

## 2024-05-25 DIAGNOSIS — K219 Gastro-esophageal reflux disease without esophagitis: Secondary | ICD-10-CM | POA: Diagnosis not present

## 2024-05-25 DIAGNOSIS — Z85038 Personal history of other malignant neoplasm of large intestine: Secondary | ICD-10-CM | POA: Diagnosis not present

## 2024-05-25 DIAGNOSIS — M48061 Spinal stenosis, lumbar region without neurogenic claudication: Secondary | ICD-10-CM | POA: Diagnosis not present

## 2024-05-25 DIAGNOSIS — Z85820 Personal history of malignant melanoma of skin: Secondary | ICD-10-CM | POA: Diagnosis not present

## 2024-05-25 DIAGNOSIS — F419 Anxiety disorder, unspecified: Secondary | ICD-10-CM | POA: Diagnosis not present

## 2024-05-25 DIAGNOSIS — G8911 Acute pain due to trauma: Secondary | ICD-10-CM | POA: Diagnosis not present

## 2024-05-25 DIAGNOSIS — F32A Depression, unspecified: Secondary | ICD-10-CM | POA: Diagnosis not present

## 2024-05-26 DIAGNOSIS — F32A Depression, unspecified: Secondary | ICD-10-CM | POA: Diagnosis not present

## 2024-05-26 DIAGNOSIS — M80012D Age-related osteoporosis with current pathological fracture, left shoulder, subsequent encounter for fracture with routine healing: Secondary | ICD-10-CM | POA: Diagnosis not present

## 2024-05-26 DIAGNOSIS — I10 Essential (primary) hypertension: Secondary | ICD-10-CM | POA: Diagnosis not present

## 2024-05-26 DIAGNOSIS — E785 Hyperlipidemia, unspecified: Secondary | ICD-10-CM | POA: Diagnosis not present

## 2024-05-26 DIAGNOSIS — Z96641 Presence of right artificial hip joint: Secondary | ICD-10-CM | POA: Diagnosis not present

## 2024-05-26 DIAGNOSIS — M48061 Spinal stenosis, lumbar region without neurogenic claudication: Secondary | ICD-10-CM | POA: Diagnosis not present

## 2024-05-26 DIAGNOSIS — F419 Anxiety disorder, unspecified: Secondary | ICD-10-CM | POA: Diagnosis not present

## 2024-05-26 DIAGNOSIS — Z85828 Personal history of other malignant neoplasm of skin: Secondary | ICD-10-CM | POA: Diagnosis not present

## 2024-05-26 DIAGNOSIS — K219 Gastro-esophageal reflux disease without esophagitis: Secondary | ICD-10-CM | POA: Diagnosis not present

## 2024-05-26 DIAGNOSIS — M80052D Age-related osteoporosis with current pathological fracture, left femur, subsequent encounter for fracture with routine healing: Secondary | ICD-10-CM | POA: Diagnosis not present

## 2024-05-26 DIAGNOSIS — Z85820 Personal history of malignant melanoma of skin: Secondary | ICD-10-CM | POA: Diagnosis not present

## 2024-05-26 DIAGNOSIS — Z85038 Personal history of other malignant neoplasm of large intestine: Secondary | ICD-10-CM | POA: Diagnosis not present

## 2024-05-26 DIAGNOSIS — D72829 Elevated white blood cell count, unspecified: Secondary | ICD-10-CM | POA: Diagnosis not present

## 2024-05-26 DIAGNOSIS — G8911 Acute pain due to trauma: Secondary | ICD-10-CM | POA: Diagnosis not present

## 2024-06-01 DIAGNOSIS — G8911 Acute pain due to trauma: Secondary | ICD-10-CM | POA: Diagnosis not present

## 2024-06-01 DIAGNOSIS — I10 Essential (primary) hypertension: Secondary | ICD-10-CM | POA: Diagnosis not present

## 2024-06-01 DIAGNOSIS — Z85820 Personal history of malignant melanoma of skin: Secondary | ICD-10-CM | POA: Diagnosis not present

## 2024-06-01 DIAGNOSIS — Z85828 Personal history of other malignant neoplasm of skin: Secondary | ICD-10-CM | POA: Diagnosis not present

## 2024-06-01 DIAGNOSIS — M80052D Age-related osteoporosis with current pathological fracture, left femur, subsequent encounter for fracture with routine healing: Secondary | ICD-10-CM | POA: Diagnosis not present

## 2024-06-01 DIAGNOSIS — F32A Depression, unspecified: Secondary | ICD-10-CM | POA: Diagnosis not present

## 2024-06-01 DIAGNOSIS — Z96641 Presence of right artificial hip joint: Secondary | ICD-10-CM | POA: Diagnosis not present

## 2024-06-01 DIAGNOSIS — M80012D Age-related osteoporosis with current pathological fracture, left shoulder, subsequent encounter for fracture with routine healing: Secondary | ICD-10-CM | POA: Diagnosis not present

## 2024-06-01 DIAGNOSIS — F419 Anxiety disorder, unspecified: Secondary | ICD-10-CM | POA: Diagnosis not present

## 2024-06-01 DIAGNOSIS — Z85038 Personal history of other malignant neoplasm of large intestine: Secondary | ICD-10-CM | POA: Diagnosis not present

## 2024-06-01 DIAGNOSIS — K219 Gastro-esophageal reflux disease without esophagitis: Secondary | ICD-10-CM | POA: Diagnosis not present

## 2024-06-01 DIAGNOSIS — M48061 Spinal stenosis, lumbar region without neurogenic claudication: Secondary | ICD-10-CM | POA: Diagnosis not present

## 2024-06-01 DIAGNOSIS — E785 Hyperlipidemia, unspecified: Secondary | ICD-10-CM | POA: Diagnosis not present

## 2024-06-01 DIAGNOSIS — D72829 Elevated white blood cell count, unspecified: Secondary | ICD-10-CM | POA: Diagnosis not present

## 2024-06-03 DIAGNOSIS — F32A Depression, unspecified: Secondary | ICD-10-CM | POA: Diagnosis not present

## 2024-06-03 DIAGNOSIS — M80012D Age-related osteoporosis with current pathological fracture, left shoulder, subsequent encounter for fracture with routine healing: Secondary | ICD-10-CM | POA: Diagnosis not present

## 2024-06-03 DIAGNOSIS — D72829 Elevated white blood cell count, unspecified: Secondary | ICD-10-CM | POA: Diagnosis not present

## 2024-06-03 DIAGNOSIS — G8911 Acute pain due to trauma: Secondary | ICD-10-CM | POA: Diagnosis not present

## 2024-06-03 DIAGNOSIS — Z96641 Presence of right artificial hip joint: Secondary | ICD-10-CM | POA: Diagnosis not present

## 2024-06-03 DIAGNOSIS — K219 Gastro-esophageal reflux disease without esophagitis: Secondary | ICD-10-CM | POA: Diagnosis not present

## 2024-06-03 DIAGNOSIS — E785 Hyperlipidemia, unspecified: Secondary | ICD-10-CM | POA: Diagnosis not present

## 2024-06-03 DIAGNOSIS — F419 Anxiety disorder, unspecified: Secondary | ICD-10-CM | POA: Diagnosis not present

## 2024-06-03 DIAGNOSIS — I10 Essential (primary) hypertension: Secondary | ICD-10-CM | POA: Diagnosis not present

## 2024-06-03 DIAGNOSIS — Z85820 Personal history of malignant melanoma of skin: Secondary | ICD-10-CM | POA: Diagnosis not present

## 2024-06-03 DIAGNOSIS — M48061 Spinal stenosis, lumbar region without neurogenic claudication: Secondary | ICD-10-CM | POA: Diagnosis not present

## 2024-06-03 DIAGNOSIS — M80052D Age-related osteoporosis with current pathological fracture, left femur, subsequent encounter for fracture with routine healing: Secondary | ICD-10-CM | POA: Diagnosis not present

## 2024-06-03 DIAGNOSIS — Z85828 Personal history of other malignant neoplasm of skin: Secondary | ICD-10-CM | POA: Diagnosis not present

## 2024-06-03 DIAGNOSIS — Z85038 Personal history of other malignant neoplasm of large intestine: Secondary | ICD-10-CM | POA: Diagnosis not present

## 2024-06-12 DIAGNOSIS — M48061 Spinal stenosis, lumbar region without neurogenic claudication: Secondary | ICD-10-CM | POA: Diagnosis not present

## 2024-06-12 DIAGNOSIS — D72829 Elevated white blood cell count, unspecified: Secondary | ICD-10-CM | POA: Diagnosis not present

## 2024-06-12 DIAGNOSIS — Z85820 Personal history of malignant melanoma of skin: Secondary | ICD-10-CM | POA: Diagnosis not present

## 2024-06-12 DIAGNOSIS — E785 Hyperlipidemia, unspecified: Secondary | ICD-10-CM | POA: Diagnosis not present

## 2024-06-12 DIAGNOSIS — Z96641 Presence of right artificial hip joint: Secondary | ICD-10-CM | POA: Diagnosis not present

## 2024-06-12 DIAGNOSIS — F32A Depression, unspecified: Secondary | ICD-10-CM | POA: Diagnosis not present

## 2024-06-12 DIAGNOSIS — K219 Gastro-esophageal reflux disease without esophagitis: Secondary | ICD-10-CM | POA: Diagnosis not present

## 2024-06-12 DIAGNOSIS — I10 Essential (primary) hypertension: Secondary | ICD-10-CM | POA: Diagnosis not present

## 2024-06-12 DIAGNOSIS — M80052D Age-related osteoporosis with current pathological fracture, left femur, subsequent encounter for fracture with routine healing: Secondary | ICD-10-CM | POA: Diagnosis not present

## 2024-06-12 DIAGNOSIS — F419 Anxiety disorder, unspecified: Secondary | ICD-10-CM | POA: Diagnosis not present

## 2024-06-12 DIAGNOSIS — Z85828 Personal history of other malignant neoplasm of skin: Secondary | ICD-10-CM | POA: Diagnosis not present

## 2024-06-12 DIAGNOSIS — G8911 Acute pain due to trauma: Secondary | ICD-10-CM | POA: Diagnosis not present

## 2024-06-12 DIAGNOSIS — Z85038 Personal history of other malignant neoplasm of large intestine: Secondary | ICD-10-CM | POA: Diagnosis not present

## 2024-06-12 DIAGNOSIS — M80012D Age-related osteoporosis with current pathological fracture, left shoulder, subsequent encounter for fracture with routine healing: Secondary | ICD-10-CM | POA: Diagnosis not present

## 2024-06-17 DIAGNOSIS — I10 Essential (primary) hypertension: Secondary | ICD-10-CM | POA: Diagnosis not present

## 2024-06-17 DIAGNOSIS — M80052D Age-related osteoporosis with current pathological fracture, left femur, subsequent encounter for fracture with routine healing: Secondary | ICD-10-CM | POA: Diagnosis not present

## 2024-06-17 DIAGNOSIS — M48061 Spinal stenosis, lumbar region without neurogenic claudication: Secondary | ICD-10-CM | POA: Diagnosis not present

## 2024-06-17 DIAGNOSIS — M80012D Age-related osteoporosis with current pathological fracture, left shoulder, subsequent encounter for fracture with routine healing: Secondary | ICD-10-CM | POA: Diagnosis not present

## 2024-06-18 DIAGNOSIS — Z85820 Personal history of malignant melanoma of skin: Secondary | ICD-10-CM | POA: Diagnosis not present

## 2024-06-18 DIAGNOSIS — K219 Gastro-esophageal reflux disease without esophagitis: Secondary | ICD-10-CM | POA: Diagnosis not present

## 2024-06-18 DIAGNOSIS — Z96643 Presence of artificial hip joint, bilateral: Secondary | ICD-10-CM | POA: Diagnosis not present

## 2024-06-18 DIAGNOSIS — M80012D Age-related osteoporosis with current pathological fracture, left shoulder, subsequent encounter for fracture with routine healing: Secondary | ICD-10-CM | POA: Diagnosis not present

## 2024-06-18 DIAGNOSIS — F32A Depression, unspecified: Secondary | ICD-10-CM | POA: Diagnosis not present

## 2024-06-18 DIAGNOSIS — F419 Anxiety disorder, unspecified: Secondary | ICD-10-CM | POA: Diagnosis not present

## 2024-06-18 DIAGNOSIS — E785 Hyperlipidemia, unspecified: Secondary | ICD-10-CM | POA: Diagnosis not present

## 2024-06-18 DIAGNOSIS — M48061 Spinal stenosis, lumbar region without neurogenic claudication: Secondary | ICD-10-CM | POA: Diagnosis not present

## 2024-06-18 DIAGNOSIS — I10 Essential (primary) hypertension: Secondary | ICD-10-CM | POA: Diagnosis not present

## 2024-06-18 DIAGNOSIS — Z85038 Personal history of other malignant neoplasm of large intestine: Secondary | ICD-10-CM | POA: Diagnosis not present

## 2024-06-18 DIAGNOSIS — M80052D Age-related osteoporosis with current pathological fracture, left femur, subsequent encounter for fracture with routine healing: Secondary | ICD-10-CM | POA: Diagnosis not present

## 2024-06-23 DIAGNOSIS — E785 Hyperlipidemia, unspecified: Secondary | ICD-10-CM | POA: Diagnosis not present

## 2024-06-23 DIAGNOSIS — I10 Essential (primary) hypertension: Secondary | ICD-10-CM | POA: Diagnosis not present

## 2024-06-23 DIAGNOSIS — K219 Gastro-esophageal reflux disease without esophagitis: Secondary | ICD-10-CM | POA: Diagnosis not present

## 2024-06-23 DIAGNOSIS — M48061 Spinal stenosis, lumbar region without neurogenic claudication: Secondary | ICD-10-CM | POA: Diagnosis not present

## 2024-06-23 DIAGNOSIS — M80012D Age-related osteoporosis with current pathological fracture, left shoulder, subsequent encounter for fracture with routine healing: Secondary | ICD-10-CM | POA: Diagnosis not present

## 2024-06-23 DIAGNOSIS — Z85038 Personal history of other malignant neoplasm of large intestine: Secondary | ICD-10-CM | POA: Diagnosis not present

## 2024-06-23 DIAGNOSIS — F32A Depression, unspecified: Secondary | ICD-10-CM | POA: Diagnosis not present

## 2024-06-23 DIAGNOSIS — Z96643 Presence of artificial hip joint, bilateral: Secondary | ICD-10-CM | POA: Diagnosis not present

## 2024-06-23 DIAGNOSIS — M80052D Age-related osteoporosis with current pathological fracture, left femur, subsequent encounter for fracture with routine healing: Secondary | ICD-10-CM | POA: Diagnosis not present

## 2024-06-23 DIAGNOSIS — Z85820 Personal history of malignant melanoma of skin: Secondary | ICD-10-CM | POA: Diagnosis not present

## 2024-06-23 DIAGNOSIS — F419 Anxiety disorder, unspecified: Secondary | ICD-10-CM | POA: Diagnosis not present

## 2024-06-25 DIAGNOSIS — M48061 Spinal stenosis, lumbar region without neurogenic claudication: Secondary | ICD-10-CM | POA: Diagnosis not present

## 2024-06-25 DIAGNOSIS — F419 Anxiety disorder, unspecified: Secondary | ICD-10-CM | POA: Diagnosis not present

## 2024-06-25 DIAGNOSIS — M80012D Age-related osteoporosis with current pathological fracture, left shoulder, subsequent encounter for fracture with routine healing: Secondary | ICD-10-CM | POA: Diagnosis not present

## 2024-06-25 DIAGNOSIS — Z96643 Presence of artificial hip joint, bilateral: Secondary | ICD-10-CM | POA: Diagnosis not present

## 2024-06-25 DIAGNOSIS — K219 Gastro-esophageal reflux disease without esophagitis: Secondary | ICD-10-CM | POA: Diagnosis not present

## 2024-06-25 DIAGNOSIS — M80052D Age-related osteoporosis with current pathological fracture, left femur, subsequent encounter for fracture with routine healing: Secondary | ICD-10-CM | POA: Diagnosis not present

## 2024-06-25 DIAGNOSIS — F32A Depression, unspecified: Secondary | ICD-10-CM | POA: Diagnosis not present

## 2024-06-25 DIAGNOSIS — I10 Essential (primary) hypertension: Secondary | ICD-10-CM | POA: Diagnosis not present

## 2024-06-25 DIAGNOSIS — Z85820 Personal history of malignant melanoma of skin: Secondary | ICD-10-CM | POA: Diagnosis not present

## 2024-06-25 DIAGNOSIS — E785 Hyperlipidemia, unspecified: Secondary | ICD-10-CM | POA: Diagnosis not present

## 2024-06-25 DIAGNOSIS — Z85038 Personal history of other malignant neoplasm of large intestine: Secondary | ICD-10-CM | POA: Diagnosis not present

## 2024-07-01 DIAGNOSIS — M80052D Age-related osteoporosis with current pathological fracture, left femur, subsequent encounter for fracture with routine healing: Secondary | ICD-10-CM | POA: Diagnosis not present

## 2024-07-01 DIAGNOSIS — I10 Essential (primary) hypertension: Secondary | ICD-10-CM | POA: Diagnosis not present

## 2024-07-01 DIAGNOSIS — K219 Gastro-esophageal reflux disease without esophagitis: Secondary | ICD-10-CM | POA: Diagnosis not present

## 2024-07-01 DIAGNOSIS — Z85820 Personal history of malignant melanoma of skin: Secondary | ICD-10-CM | POA: Diagnosis not present

## 2024-07-01 DIAGNOSIS — F32A Depression, unspecified: Secondary | ICD-10-CM | POA: Diagnosis not present

## 2024-07-01 DIAGNOSIS — M80012D Age-related osteoporosis with current pathological fracture, left shoulder, subsequent encounter for fracture with routine healing: Secondary | ICD-10-CM | POA: Diagnosis not present

## 2024-07-01 DIAGNOSIS — E785 Hyperlipidemia, unspecified: Secondary | ICD-10-CM | POA: Diagnosis not present

## 2024-07-01 DIAGNOSIS — F419 Anxiety disorder, unspecified: Secondary | ICD-10-CM | POA: Diagnosis not present

## 2024-07-01 DIAGNOSIS — Z85038 Personal history of other malignant neoplasm of large intestine: Secondary | ICD-10-CM | POA: Diagnosis not present

## 2024-07-01 DIAGNOSIS — M48061 Spinal stenosis, lumbar region without neurogenic claudication: Secondary | ICD-10-CM | POA: Diagnosis not present

## 2024-07-01 DIAGNOSIS — Z96643 Presence of artificial hip joint, bilateral: Secondary | ICD-10-CM | POA: Diagnosis not present

## 2024-07-07 DIAGNOSIS — Z96643 Presence of artificial hip joint, bilateral: Secondary | ICD-10-CM | POA: Diagnosis not present

## 2024-07-07 DIAGNOSIS — M80012D Age-related osteoporosis with current pathological fracture, left shoulder, subsequent encounter for fracture with routine healing: Secondary | ICD-10-CM | POA: Diagnosis not present

## 2024-07-07 DIAGNOSIS — Z85038 Personal history of other malignant neoplasm of large intestine: Secondary | ICD-10-CM | POA: Diagnosis not present

## 2024-07-07 DIAGNOSIS — K219 Gastro-esophageal reflux disease without esophagitis: Secondary | ICD-10-CM | POA: Diagnosis not present

## 2024-07-07 DIAGNOSIS — M48061 Spinal stenosis, lumbar region without neurogenic claudication: Secondary | ICD-10-CM | POA: Diagnosis not present

## 2024-07-07 DIAGNOSIS — M80052D Age-related osteoporosis with current pathological fracture, left femur, subsequent encounter for fracture with routine healing: Secondary | ICD-10-CM | POA: Diagnosis not present

## 2024-07-07 DIAGNOSIS — Z85820 Personal history of malignant melanoma of skin: Secondary | ICD-10-CM | POA: Diagnosis not present

## 2024-07-07 DIAGNOSIS — I10 Essential (primary) hypertension: Secondary | ICD-10-CM | POA: Diagnosis not present

## 2024-07-07 DIAGNOSIS — F419 Anxiety disorder, unspecified: Secondary | ICD-10-CM | POA: Diagnosis not present

## 2024-07-07 DIAGNOSIS — F32A Depression, unspecified: Secondary | ICD-10-CM | POA: Diagnosis not present

## 2024-07-07 DIAGNOSIS — E785 Hyperlipidemia, unspecified: Secondary | ICD-10-CM | POA: Diagnosis not present

## 2024-07-08 DIAGNOSIS — M4807 Spinal stenosis, lumbosacral region: Secondary | ICD-10-CM | POA: Diagnosis not present

## 2024-07-08 DIAGNOSIS — M47816 Spondylosis without myelopathy or radiculopathy, lumbar region: Secondary | ICD-10-CM | POA: Diagnosis not present

## 2024-07-08 DIAGNOSIS — Z79899 Other long term (current) drug therapy: Secondary | ICD-10-CM | POA: Diagnosis not present

## 2024-07-08 DIAGNOSIS — M5416 Radiculopathy, lumbar region: Secondary | ICD-10-CM | POA: Diagnosis not present

## 2024-07-13 DIAGNOSIS — Z96643 Presence of artificial hip joint, bilateral: Secondary | ICD-10-CM | POA: Diagnosis not present

## 2024-07-13 DIAGNOSIS — K219 Gastro-esophageal reflux disease without esophagitis: Secondary | ICD-10-CM | POA: Diagnosis not present

## 2024-07-13 DIAGNOSIS — Z85038 Personal history of other malignant neoplasm of large intestine: Secondary | ICD-10-CM | POA: Diagnosis not present

## 2024-07-13 DIAGNOSIS — M48061 Spinal stenosis, lumbar region without neurogenic claudication: Secondary | ICD-10-CM | POA: Diagnosis not present

## 2024-07-13 DIAGNOSIS — F419 Anxiety disorder, unspecified: Secondary | ICD-10-CM | POA: Diagnosis not present

## 2024-07-13 DIAGNOSIS — F32A Depression, unspecified: Secondary | ICD-10-CM | POA: Diagnosis not present

## 2024-07-13 DIAGNOSIS — Z85820 Personal history of malignant melanoma of skin: Secondary | ICD-10-CM | POA: Diagnosis not present

## 2024-07-13 DIAGNOSIS — I10 Essential (primary) hypertension: Secondary | ICD-10-CM | POA: Diagnosis not present

## 2024-07-13 DIAGNOSIS — M80052D Age-related osteoporosis with current pathological fracture, left femur, subsequent encounter for fracture with routine healing: Secondary | ICD-10-CM | POA: Diagnosis not present

## 2024-07-13 DIAGNOSIS — M80012D Age-related osteoporosis with current pathological fracture, left shoulder, subsequent encounter for fracture with routine healing: Secondary | ICD-10-CM | POA: Diagnosis not present

## 2024-07-13 DIAGNOSIS — E785 Hyperlipidemia, unspecified: Secondary | ICD-10-CM | POA: Diagnosis not present

## 2024-07-16 DIAGNOSIS — I10 Essential (primary) hypertension: Secondary | ICD-10-CM | POA: Diagnosis not present

## 2024-07-16 DIAGNOSIS — Z85820 Personal history of malignant melanoma of skin: Secondary | ICD-10-CM | POA: Diagnosis not present

## 2024-07-16 DIAGNOSIS — M48061 Spinal stenosis, lumbar region without neurogenic claudication: Secondary | ICD-10-CM | POA: Diagnosis not present

## 2024-07-16 DIAGNOSIS — K219 Gastro-esophageal reflux disease without esophagitis: Secondary | ICD-10-CM | POA: Diagnosis not present

## 2024-07-16 DIAGNOSIS — M80052D Age-related osteoporosis with current pathological fracture, left femur, subsequent encounter for fracture with routine healing: Secondary | ICD-10-CM | POA: Diagnosis not present

## 2024-07-16 DIAGNOSIS — Z85038 Personal history of other malignant neoplasm of large intestine: Secondary | ICD-10-CM | POA: Diagnosis not present

## 2024-07-16 DIAGNOSIS — F419 Anxiety disorder, unspecified: Secondary | ICD-10-CM | POA: Diagnosis not present

## 2024-07-16 DIAGNOSIS — F32A Depression, unspecified: Secondary | ICD-10-CM | POA: Diagnosis not present

## 2024-07-16 DIAGNOSIS — M80012D Age-related osteoporosis with current pathological fracture, left shoulder, subsequent encounter for fracture with routine healing: Secondary | ICD-10-CM | POA: Diagnosis not present

## 2024-07-16 DIAGNOSIS — E785 Hyperlipidemia, unspecified: Secondary | ICD-10-CM | POA: Diagnosis not present

## 2024-07-16 DIAGNOSIS — Z96643 Presence of artificial hip joint, bilateral: Secondary | ICD-10-CM | POA: Diagnosis not present

## 2024-07-22 DIAGNOSIS — F32A Depression, unspecified: Secondary | ICD-10-CM | POA: Diagnosis not present

## 2024-07-22 DIAGNOSIS — I10 Essential (primary) hypertension: Secondary | ICD-10-CM | POA: Diagnosis not present

## 2024-07-22 DIAGNOSIS — M80052D Age-related osteoporosis with current pathological fracture, left femur, subsequent encounter for fracture with routine healing: Secondary | ICD-10-CM | POA: Diagnosis not present

## 2024-07-22 DIAGNOSIS — F419 Anxiety disorder, unspecified: Secondary | ICD-10-CM | POA: Diagnosis not present

## 2024-07-22 DIAGNOSIS — Z85820 Personal history of malignant melanoma of skin: Secondary | ICD-10-CM | POA: Diagnosis not present

## 2024-07-22 DIAGNOSIS — M80012D Age-related osteoporosis with current pathological fracture, left shoulder, subsequent encounter for fracture with routine healing: Secondary | ICD-10-CM | POA: Diagnosis not present

## 2024-07-22 DIAGNOSIS — Z85038 Personal history of other malignant neoplasm of large intestine: Secondary | ICD-10-CM | POA: Diagnosis not present

## 2024-07-22 DIAGNOSIS — K219 Gastro-esophageal reflux disease without esophagitis: Secondary | ICD-10-CM | POA: Diagnosis not present

## 2024-07-22 DIAGNOSIS — Z96643 Presence of artificial hip joint, bilateral: Secondary | ICD-10-CM | POA: Diagnosis not present

## 2024-07-22 DIAGNOSIS — E785 Hyperlipidemia, unspecified: Secondary | ICD-10-CM | POA: Diagnosis not present

## 2024-07-22 DIAGNOSIS — M48061 Spinal stenosis, lumbar region without neurogenic claudication: Secondary | ICD-10-CM | POA: Diagnosis not present

## 2024-07-27 DIAGNOSIS — M810 Age-related osteoporosis without current pathological fracture: Secondary | ICD-10-CM | POA: Diagnosis not present

## 2024-07-27 DIAGNOSIS — R1110 Vomiting, unspecified: Secondary | ICD-10-CM | POA: Diagnosis not present

## 2024-07-27 DIAGNOSIS — I351 Nonrheumatic aortic (valve) insufficiency: Secondary | ICD-10-CM | POA: Diagnosis not present

## 2024-07-27 DIAGNOSIS — E7800 Pure hypercholesterolemia, unspecified: Secondary | ICD-10-CM | POA: Diagnosis not present

## 2024-07-29 DIAGNOSIS — M80052D Age-related osteoporosis with current pathological fracture, left femur, subsequent encounter for fracture with routine healing: Secondary | ICD-10-CM | POA: Diagnosis not present

## 2024-07-29 DIAGNOSIS — Z96643 Presence of artificial hip joint, bilateral: Secondary | ICD-10-CM | POA: Diagnosis not present

## 2024-07-29 DIAGNOSIS — M48061 Spinal stenosis, lumbar region without neurogenic claudication: Secondary | ICD-10-CM | POA: Diagnosis not present

## 2024-07-29 DIAGNOSIS — Z85038 Personal history of other malignant neoplasm of large intestine: Secondary | ICD-10-CM | POA: Diagnosis not present

## 2024-07-29 DIAGNOSIS — K219 Gastro-esophageal reflux disease without esophagitis: Secondary | ICD-10-CM | POA: Diagnosis not present

## 2024-07-29 DIAGNOSIS — M80012D Age-related osteoporosis with current pathological fracture, left shoulder, subsequent encounter for fracture with routine healing: Secondary | ICD-10-CM | POA: Diagnosis not present

## 2024-07-29 DIAGNOSIS — F419 Anxiety disorder, unspecified: Secondary | ICD-10-CM | POA: Diagnosis not present

## 2024-07-29 DIAGNOSIS — E785 Hyperlipidemia, unspecified: Secondary | ICD-10-CM | POA: Diagnosis not present

## 2024-07-29 DIAGNOSIS — Z85820 Personal history of malignant melanoma of skin: Secondary | ICD-10-CM | POA: Diagnosis not present

## 2024-07-29 DIAGNOSIS — I10 Essential (primary) hypertension: Secondary | ICD-10-CM | POA: Diagnosis not present

## 2024-07-29 DIAGNOSIS — F32A Depression, unspecified: Secondary | ICD-10-CM | POA: Diagnosis not present

## 2024-07-30 DIAGNOSIS — M5416 Radiculopathy, lumbar region: Secondary | ICD-10-CM | POA: Diagnosis not present

## 2024-07-31 ENCOUNTER — Other Ambulatory Visit: Payer: Self-pay | Admitting: Family Medicine

## 2024-07-31 DIAGNOSIS — Z9889 Other specified postprocedural states: Secondary | ICD-10-CM

## 2024-07-31 DIAGNOSIS — R197 Diarrhea, unspecified: Secondary | ICD-10-CM

## 2024-07-31 DIAGNOSIS — R1110 Vomiting, unspecified: Secondary | ICD-10-CM

## 2024-07-31 DIAGNOSIS — F322 Major depressive disorder, single episode, severe without psychotic features: Secondary | ICD-10-CM | POA: Diagnosis not present

## 2024-08-05 DIAGNOSIS — F32A Depression, unspecified: Secondary | ICD-10-CM | POA: Diagnosis not present

## 2024-08-05 DIAGNOSIS — I10 Essential (primary) hypertension: Secondary | ICD-10-CM | POA: Diagnosis not present

## 2024-08-05 DIAGNOSIS — F419 Anxiety disorder, unspecified: Secondary | ICD-10-CM | POA: Diagnosis not present

## 2024-08-05 DIAGNOSIS — Z96643 Presence of artificial hip joint, bilateral: Secondary | ICD-10-CM | POA: Diagnosis not present

## 2024-08-05 DIAGNOSIS — M48061 Spinal stenosis, lumbar region without neurogenic claudication: Secondary | ICD-10-CM | POA: Diagnosis not present

## 2024-08-05 DIAGNOSIS — M80052D Age-related osteoporosis with current pathological fracture, left femur, subsequent encounter for fracture with routine healing: Secondary | ICD-10-CM | POA: Diagnosis not present

## 2024-08-05 DIAGNOSIS — M80012D Age-related osteoporosis with current pathological fracture, left shoulder, subsequent encounter for fracture with routine healing: Secondary | ICD-10-CM | POA: Diagnosis not present

## 2024-08-05 DIAGNOSIS — E785 Hyperlipidemia, unspecified: Secondary | ICD-10-CM | POA: Diagnosis not present

## 2024-08-05 DIAGNOSIS — K219 Gastro-esophageal reflux disease without esophagitis: Secondary | ICD-10-CM | POA: Diagnosis not present

## 2024-08-05 DIAGNOSIS — Z85038 Personal history of other malignant neoplasm of large intestine: Secondary | ICD-10-CM | POA: Diagnosis not present

## 2024-08-05 DIAGNOSIS — R1110 Vomiting, unspecified: Secondary | ICD-10-CM | POA: Diagnosis not present

## 2024-08-05 DIAGNOSIS — Z85820 Personal history of malignant melanoma of skin: Secondary | ICD-10-CM | POA: Diagnosis not present

## 2024-08-11 ENCOUNTER — Ambulatory Visit
Admission: RE | Admit: 2024-08-11 | Discharge: 2024-08-11 | Disposition: A | Discharge status: Home / Self Care | Source: Ambulatory Visit | Attending: Family Medicine | Admitting: Family Medicine

## 2024-08-11 DIAGNOSIS — K5730 Diverticulosis of large intestine without perforation or abscess without bleeding: Secondary | ICD-10-CM | POA: Diagnosis not present

## 2024-08-11 DIAGNOSIS — Z9889 Other specified postprocedural states: Secondary | ICD-10-CM

## 2024-08-11 DIAGNOSIS — R197 Diarrhea, unspecified: Secondary | ICD-10-CM

## 2024-08-11 DIAGNOSIS — R1110 Vomiting, unspecified: Secondary | ICD-10-CM

## 2024-08-11 MED ORDER — IOPAMIDOL (ISOVUE-370) INJECTION 76%
75.0000 mL | Freq: Once | INTRAVENOUS | Status: AC | PRN
  Administered 2024-08-11: 75 mL via INTRAVENOUS

## 2024-08-12 DIAGNOSIS — M48061 Spinal stenosis, lumbar region without neurogenic claudication: Secondary | ICD-10-CM | POA: Diagnosis not present

## 2024-08-12 DIAGNOSIS — F322 Major depressive disorder, single episode, severe without psychotic features: Secondary | ICD-10-CM | POA: Diagnosis not present

## 2024-08-12 DIAGNOSIS — M80052D Age-related osteoporosis with current pathological fracture, left femur, subsequent encounter for fracture with routine healing: Secondary | ICD-10-CM | POA: Diagnosis not present

## 2024-08-12 DIAGNOSIS — Z96643 Presence of artificial hip joint, bilateral: Secondary | ICD-10-CM | POA: Diagnosis not present

## 2024-08-12 DIAGNOSIS — I10 Essential (primary) hypertension: Secondary | ICD-10-CM | POA: Diagnosis not present

## 2024-08-12 DIAGNOSIS — Z85038 Personal history of other malignant neoplasm of large intestine: Secondary | ICD-10-CM | POA: Diagnosis not present

## 2024-08-12 DIAGNOSIS — Z85820 Personal history of malignant melanoma of skin: Secondary | ICD-10-CM | POA: Diagnosis not present

## 2024-08-12 DIAGNOSIS — F419 Anxiety disorder, unspecified: Secondary | ICD-10-CM | POA: Diagnosis not present

## 2024-08-12 DIAGNOSIS — E785 Hyperlipidemia, unspecified: Secondary | ICD-10-CM | POA: Diagnosis not present

## 2024-08-12 DIAGNOSIS — M80012D Age-related osteoporosis with current pathological fracture, left shoulder, subsequent encounter for fracture with routine healing: Secondary | ICD-10-CM | POA: Diagnosis not present

## 2024-08-12 DIAGNOSIS — F32A Depression, unspecified: Secondary | ICD-10-CM | POA: Diagnosis not present

## 2024-08-12 DIAGNOSIS — K219 Gastro-esophageal reflux disease without esophagitis: Secondary | ICD-10-CM | POA: Diagnosis not present

## 2024-08-12 DIAGNOSIS — R109 Unspecified abdominal pain: Secondary | ICD-10-CM | POA: Diagnosis not present

## 2024-08-16 DIAGNOSIS — M48061 Spinal stenosis, lumbar region without neurogenic claudication: Secondary | ICD-10-CM | POA: Diagnosis not present

## 2024-08-16 DIAGNOSIS — I10 Essential (primary) hypertension: Secondary | ICD-10-CM | POA: Diagnosis not present

## 2024-08-16 DIAGNOSIS — M80012D Age-related osteoporosis with current pathological fracture, left shoulder, subsequent encounter for fracture with routine healing: Secondary | ICD-10-CM | POA: Diagnosis not present

## 2024-08-16 DIAGNOSIS — M80052D Age-related osteoporosis with current pathological fracture, left femur, subsequent encounter for fracture with routine healing: Secondary | ICD-10-CM | POA: Diagnosis not present

## 2024-08-19 DIAGNOSIS — E785 Hyperlipidemia, unspecified: Secondary | ICD-10-CM | POA: Diagnosis not present

## 2024-08-19 DIAGNOSIS — I10 Essential (primary) hypertension: Secondary | ICD-10-CM | POA: Diagnosis not present

## 2024-08-19 DIAGNOSIS — K219 Gastro-esophageal reflux disease without esophagitis: Secondary | ICD-10-CM | POA: Diagnosis not present

## 2024-08-19 DIAGNOSIS — M48061 Spinal stenosis, lumbar region without neurogenic claudication: Secondary | ICD-10-CM | POA: Diagnosis not present

## 2024-08-19 DIAGNOSIS — Z85820 Personal history of malignant melanoma of skin: Secondary | ICD-10-CM | POA: Diagnosis not present

## 2024-08-19 DIAGNOSIS — Z85038 Personal history of other malignant neoplasm of large intestine: Secondary | ICD-10-CM | POA: Diagnosis not present

## 2024-08-19 DIAGNOSIS — M80052D Age-related osteoporosis with current pathological fracture, left femur, subsequent encounter for fracture with routine healing: Secondary | ICD-10-CM | POA: Diagnosis not present

## 2024-08-19 DIAGNOSIS — F32A Depression, unspecified: Secondary | ICD-10-CM | POA: Diagnosis not present

## 2024-08-19 DIAGNOSIS — Z96643 Presence of artificial hip joint, bilateral: Secondary | ICD-10-CM | POA: Diagnosis not present

## 2024-08-19 DIAGNOSIS — M80012D Age-related osteoporosis with current pathological fracture, left shoulder, subsequent encounter for fracture with routine healing: Secondary | ICD-10-CM | POA: Diagnosis not present

## 2024-08-19 DIAGNOSIS — F419 Anxiety disorder, unspecified: Secondary | ICD-10-CM | POA: Diagnosis not present

## 2024-08-27 DIAGNOSIS — M80052D Age-related osteoporosis with current pathological fracture, left femur, subsequent encounter for fracture with routine healing: Secondary | ICD-10-CM | POA: Diagnosis not present

## 2024-08-27 DIAGNOSIS — I10 Essential (primary) hypertension: Secondary | ICD-10-CM | POA: Diagnosis not present

## 2024-08-27 DIAGNOSIS — Z96643 Presence of artificial hip joint, bilateral: Secondary | ICD-10-CM | POA: Diagnosis not present

## 2024-08-27 DIAGNOSIS — Z85038 Personal history of other malignant neoplasm of large intestine: Secondary | ICD-10-CM | POA: Diagnosis not present

## 2024-08-27 DIAGNOSIS — M80012D Age-related osteoporosis with current pathological fracture, left shoulder, subsequent encounter for fracture with routine healing: Secondary | ICD-10-CM | POA: Diagnosis not present

## 2024-08-27 DIAGNOSIS — E785 Hyperlipidemia, unspecified: Secondary | ICD-10-CM | POA: Diagnosis not present

## 2024-08-27 DIAGNOSIS — M48061 Spinal stenosis, lumbar region without neurogenic claudication: Secondary | ICD-10-CM | POA: Diagnosis not present

## 2024-08-27 DIAGNOSIS — F419 Anxiety disorder, unspecified: Secondary | ICD-10-CM | POA: Diagnosis not present

## 2024-08-27 DIAGNOSIS — Z85820 Personal history of malignant melanoma of skin: Secondary | ICD-10-CM | POA: Diagnosis not present

## 2024-08-27 DIAGNOSIS — K219 Gastro-esophageal reflux disease without esophagitis: Secondary | ICD-10-CM | POA: Diagnosis not present

## 2024-08-27 DIAGNOSIS — F32A Depression, unspecified: Secondary | ICD-10-CM | POA: Diagnosis not present

## 2024-09-01 DIAGNOSIS — M4807 Spinal stenosis, lumbosacral region: Secondary | ICD-10-CM | POA: Diagnosis not present

## 2024-09-01 DIAGNOSIS — Z79899 Other long term (current) drug therapy: Secondary | ICD-10-CM | POA: Diagnosis not present

## 2024-09-01 DIAGNOSIS — M47816 Spondylosis without myelopathy or radiculopathy, lumbar region: Secondary | ICD-10-CM | POA: Diagnosis not present

## 2024-09-01 DIAGNOSIS — M5416 Radiculopathy, lumbar region: Secondary | ICD-10-CM | POA: Diagnosis not present

## 2024-09-02 DIAGNOSIS — M80052D Age-related osteoporosis with current pathological fracture, left femur, subsequent encounter for fracture with routine healing: Secondary | ICD-10-CM | POA: Diagnosis not present

## 2024-09-02 DIAGNOSIS — F419 Anxiety disorder, unspecified: Secondary | ICD-10-CM | POA: Diagnosis not present

## 2024-09-02 DIAGNOSIS — Z96643 Presence of artificial hip joint, bilateral: Secondary | ICD-10-CM | POA: Diagnosis not present

## 2024-09-02 DIAGNOSIS — Z85038 Personal history of other malignant neoplasm of large intestine: Secondary | ICD-10-CM | POA: Diagnosis not present

## 2024-09-02 DIAGNOSIS — M80012D Age-related osteoporosis with current pathological fracture, left shoulder, subsequent encounter for fracture with routine healing: Secondary | ICD-10-CM | POA: Diagnosis not present

## 2024-09-02 DIAGNOSIS — F32A Depression, unspecified: Secondary | ICD-10-CM | POA: Diagnosis not present

## 2024-09-02 DIAGNOSIS — E785 Hyperlipidemia, unspecified: Secondary | ICD-10-CM | POA: Diagnosis not present

## 2024-09-02 DIAGNOSIS — K219 Gastro-esophageal reflux disease without esophagitis: Secondary | ICD-10-CM | POA: Diagnosis not present

## 2024-09-02 DIAGNOSIS — I10 Essential (primary) hypertension: Secondary | ICD-10-CM | POA: Diagnosis not present

## 2024-09-02 DIAGNOSIS — M48061 Spinal stenosis, lumbar region without neurogenic claudication: Secondary | ICD-10-CM | POA: Diagnosis not present

## 2024-09-02 DIAGNOSIS — Z85820 Personal history of malignant melanoma of skin: Secondary | ICD-10-CM | POA: Diagnosis not present

## 2024-09-08 DIAGNOSIS — I10 Essential (primary) hypertension: Secondary | ICD-10-CM | POA: Diagnosis not present

## 2024-09-08 DIAGNOSIS — K219 Gastro-esophageal reflux disease without esophagitis: Secondary | ICD-10-CM | POA: Diagnosis not present

## 2024-09-08 DIAGNOSIS — M4807 Spinal stenosis, lumbosacral region: Secondary | ICD-10-CM | POA: Diagnosis not present

## 2024-09-08 DIAGNOSIS — M48061 Spinal stenosis, lumbar region without neurogenic claudication: Secondary | ICD-10-CM | POA: Diagnosis not present

## 2024-09-08 DIAGNOSIS — Z85038 Personal history of other malignant neoplasm of large intestine: Secondary | ICD-10-CM | POA: Diagnosis not present

## 2024-09-08 DIAGNOSIS — E785 Hyperlipidemia, unspecified: Secondary | ICD-10-CM | POA: Diagnosis not present

## 2024-09-08 DIAGNOSIS — M80052D Age-related osteoporosis with current pathological fracture, left femur, subsequent encounter for fracture with routine healing: Secondary | ICD-10-CM | POA: Diagnosis not present

## 2024-09-08 DIAGNOSIS — Z5181 Encounter for therapeutic drug level monitoring: Secondary | ICD-10-CM | POA: Diagnosis not present

## 2024-09-08 DIAGNOSIS — Z96643 Presence of artificial hip joint, bilateral: Secondary | ICD-10-CM | POA: Diagnosis not present

## 2024-09-08 DIAGNOSIS — Z85820 Personal history of malignant melanoma of skin: Secondary | ICD-10-CM | POA: Diagnosis not present

## 2024-09-08 DIAGNOSIS — F419 Anxiety disorder, unspecified: Secondary | ICD-10-CM | POA: Diagnosis not present

## 2024-09-08 DIAGNOSIS — M5417 Radiculopathy, lumbosacral region: Secondary | ICD-10-CM | POA: Diagnosis not present

## 2024-09-08 DIAGNOSIS — F32A Depression, unspecified: Secondary | ICD-10-CM | POA: Diagnosis not present

## 2024-09-08 DIAGNOSIS — Z79899 Other long term (current) drug therapy: Secondary | ICD-10-CM | POA: Diagnosis not present

## 2024-09-08 DIAGNOSIS — M80012D Age-related osteoporosis with current pathological fracture, left shoulder, subsequent encounter for fracture with routine healing: Secondary | ICD-10-CM | POA: Diagnosis not present

## 2024-09-16 DIAGNOSIS — M48061 Spinal stenosis, lumbar region without neurogenic claudication: Secondary | ICD-10-CM | POA: Diagnosis not present

## 2024-09-16 DIAGNOSIS — F419 Anxiety disorder, unspecified: Secondary | ICD-10-CM | POA: Diagnosis not present

## 2024-09-16 DIAGNOSIS — K219 Gastro-esophageal reflux disease without esophagitis: Secondary | ICD-10-CM | POA: Diagnosis not present

## 2024-09-16 DIAGNOSIS — Z85038 Personal history of other malignant neoplasm of large intestine: Secondary | ICD-10-CM | POA: Diagnosis not present

## 2024-09-16 DIAGNOSIS — M80012D Age-related osteoporosis with current pathological fracture, left shoulder, subsequent encounter for fracture with routine healing: Secondary | ICD-10-CM | POA: Diagnosis not present

## 2024-09-16 DIAGNOSIS — E785 Hyperlipidemia, unspecified: Secondary | ICD-10-CM | POA: Diagnosis not present

## 2024-09-16 DIAGNOSIS — F32A Depression, unspecified: Secondary | ICD-10-CM | POA: Diagnosis not present

## 2024-09-16 DIAGNOSIS — I10 Essential (primary) hypertension: Secondary | ICD-10-CM | POA: Diagnosis not present

## 2024-09-16 DIAGNOSIS — Z85820 Personal history of malignant melanoma of skin: Secondary | ICD-10-CM | POA: Diagnosis not present

## 2024-09-16 DIAGNOSIS — M80052D Age-related osteoporosis with current pathological fracture, left femur, subsequent encounter for fracture with routine healing: Secondary | ICD-10-CM | POA: Diagnosis not present

## 2024-09-16 DIAGNOSIS — Z96643 Presence of artificial hip joint, bilateral: Secondary | ICD-10-CM | POA: Diagnosis not present

## 2024-09-24 DIAGNOSIS — Z85820 Personal history of malignant melanoma of skin: Secondary | ICD-10-CM | POA: Diagnosis not present

## 2024-09-24 DIAGNOSIS — M80012D Age-related osteoporosis with current pathological fracture, left shoulder, subsequent encounter for fracture with routine healing: Secondary | ICD-10-CM | POA: Diagnosis not present

## 2024-09-24 DIAGNOSIS — F419 Anxiety disorder, unspecified: Secondary | ICD-10-CM | POA: Diagnosis not present

## 2024-09-24 DIAGNOSIS — M48061 Spinal stenosis, lumbar region without neurogenic claudication: Secondary | ICD-10-CM | POA: Diagnosis not present

## 2024-09-24 DIAGNOSIS — E785 Hyperlipidemia, unspecified: Secondary | ICD-10-CM | POA: Diagnosis not present

## 2024-09-24 DIAGNOSIS — M80052D Age-related osteoporosis with current pathological fracture, left femur, subsequent encounter for fracture with routine healing: Secondary | ICD-10-CM | POA: Diagnosis not present

## 2024-09-24 DIAGNOSIS — Z96643 Presence of artificial hip joint, bilateral: Secondary | ICD-10-CM | POA: Diagnosis not present

## 2024-09-24 DIAGNOSIS — K219 Gastro-esophageal reflux disease without esophagitis: Secondary | ICD-10-CM | POA: Diagnosis not present

## 2024-09-24 DIAGNOSIS — F32A Depression, unspecified: Secondary | ICD-10-CM | POA: Diagnosis not present

## 2024-09-24 DIAGNOSIS — Z85038 Personal history of other malignant neoplasm of large intestine: Secondary | ICD-10-CM | POA: Diagnosis not present

## 2024-09-24 DIAGNOSIS — I10 Essential (primary) hypertension: Secondary | ICD-10-CM | POA: Diagnosis not present

## 2024-10-12 ENCOUNTER — Other Ambulatory Visit (INDEPENDENT_AMBULATORY_CARE_PROVIDER_SITE_OTHER): Payer: Self-pay

## 2024-10-12 ENCOUNTER — Ambulatory Visit: Admitting: Orthopedic Surgery

## 2024-10-12 DIAGNOSIS — M545 Low back pain, unspecified: Secondary | ICD-10-CM

## 2024-10-12 NOTE — Progress Notes (Signed)
 Orthopedic Spine Surgery Office Note  Assessment: Patient is a 89 y.o. female with low back pain that radiates into bilateral hips.  Has a heaviness in the legs.  Her pain is felt with walking or standing and improves with flexion of lumbar spine consistent with neurogenic claudication   Plan: -Patient has had several osteoporosis related fractures.  She asked if she could stop using walker.  I advised her to continue to use the walker as she is had to hip fractures and several vertebral compression fractures -I told her that her symptoms sound consistent with lumbar stenosis.  She has an old MRI that showed stenosis at L4/5.  She is not interested in any kind of open surgical procedure.  She is interested in the MILD procedure.  I explained that is not a procedure that I did.  I found a physician in the [facility] system who does do this procedure.  Referral provided to her today - Since she has had symptoms for over 6 weeks and spite of multiple conservative treatments, I recommended an MRI of the lumbar spine to evaluate further -Patient should return to office on an as-needed basis   Patient expressed understanding of the plan and all questions were answered to the patient's satisfaction.   ___________________________________________________________________________   History:  Patient is a 89 y.o. female who presents today for lumbar spine.  Patient has had 2-1/2 years of low back pain that goes into her bilateral hips.  She notes a heaviness in her legs as well when she is standing or walking.  Her pain and heaviness improves if she sits down.  She has tried multiple conservative treatments but has not gotten better.  She had this pain before her compression fractures.  She had kyphoplasty's which helped with the increased pain associated with the compression fractures.  However, her low back pain that goes into the hips and the heaviness in her legs persisted.  Her pain is felt around the belt  line in the lumbar spine.  She does have a history of urinary incontinence.  There have been no recent changes in her bowel or bladder habits.  No saddle anesthesia.  Ambulates with a walker.  Treatments tried: PT, tylenol , ibuprofen, steroid injections, RFA, norco  Review of systems: Denies fevers and chills, night sweats, unexplained weight loss, history of cancer, pain that wakes them at night  Past medical history: Osteoporosis Depression History of melanoma and colon cancer HLD GERD  Allergies: aristocort , cortisone, erythromycin, hydrocodone   Past surgical history:  Right hip cemented hemiarthroplasty Left hip CMN Right colectomy Cataract surgery C section Cholecystectomy Kyphoplasty Melanoma excision Tonsillectomy  Social history: Denies use of nicotine product (smoking, vaping, patches, smokeless) Alcohol use: denies Denies recreational drug use   Physical Exam:  General: no acute distress, appears stated age Neurologic: alert, answering questions appropriately, following commands Respiratory: unlabored breathing on room air, symmetric chest rise Psychiatric: appropriate affect, normal cadence to speech   MSK (spine):  -Strength exam      Left  Right EHL    4/5  4/5 TA    4/5  4/5 GSC    5/5  5/5 Knee extension  5/5  5/5 Hip flexion   5/5  5/5  -Sensory exam    Sensation intact to light touch in L3-S1 nerve distributions of bilateral lower extremities   Imaging: XRs of the lumbar spine from 10/12/2024 were independently reviewed and interpreted, showing cement augmentation in the L2 and L3 vertebra.  Compression fractures  at T12, L1, L4.  Focal kyphosis at the T12 compression fracture.  Fractures appear well-corticated and chronic.  No dislocation seen.   Patient name: Frances Ortiz Patient MRN: [id number] Date of visit: 10/12/2024   I spent 30 minutes in the room with the patient and her daughter.  I went over the symptoms and performed my  exam.  I also used this time to review her prior imaging.  I showed them the prior MRI that have been obtained in 2024.  I went over her symptoms and how they are consistent with neurogenic claudication.  I also used this time to help them since they have been to many doctors and finding someone that does the MILD procedure.  I looked at several different websites and found a provider in the [facility] system he does the mild procedure.

## 2024-11-16 ENCOUNTER — Emergency Department (HOSPITAL_COMMUNITY)

## 2024-11-16 ENCOUNTER — Encounter (HOSPITAL_COMMUNITY): Payer: Self-pay | Admitting: Emergency Medicine

## 2024-11-16 ENCOUNTER — Observation Stay (HOSPITAL_COMMUNITY): Admission: EM | Admit: 2024-11-16 | Discharge: 2024-11-20 | Disposition: A | Discharge status: Home / Self Care

## 2024-11-16 ENCOUNTER — Other Ambulatory Visit: Payer: Self-pay

## 2024-11-16 DIAGNOSIS — Z85828 Personal history of other malignant neoplasm of skin: Secondary | ICD-10-CM | POA: Diagnosis not present

## 2024-11-16 DIAGNOSIS — N19 Unspecified kidney failure: Secondary | ICD-10-CM | POA: Diagnosis present

## 2024-11-16 DIAGNOSIS — R7989 Other specified abnormal findings of blood chemistry: Secondary | ICD-10-CM | POA: Insufficient documentation

## 2024-11-16 DIAGNOSIS — I10 Essential (primary) hypertension: Secondary | ICD-10-CM | POA: Diagnosis not present

## 2024-11-16 DIAGNOSIS — Z85038 Personal history of other malignant neoplasm of large intestine: Secondary | ICD-10-CM | POA: Insufficient documentation

## 2024-11-16 DIAGNOSIS — F411 Generalized anxiety disorder: Secondary | ICD-10-CM | POA: Diagnosis present

## 2024-11-16 DIAGNOSIS — Z96641 Presence of right artificial hip joint: Secondary | ICD-10-CM | POA: Insufficient documentation

## 2024-11-16 DIAGNOSIS — U071 COVID-19: Principal | ICD-10-CM | POA: Diagnosis present

## 2024-11-16 DIAGNOSIS — Z79899 Other long term (current) drug therapy: Secondary | ICD-10-CM | POA: Insufficient documentation

## 2024-11-16 DIAGNOSIS — R531 Weakness: Secondary | ICD-10-CM | POA: Diagnosis not present

## 2024-11-16 DIAGNOSIS — Z9049 Acquired absence of other specified parts of digestive tract: Secondary | ICD-10-CM | POA: Insufficient documentation

## 2024-11-16 DIAGNOSIS — E860 Dehydration: Secondary | ICD-10-CM | POA: Diagnosis not present

## 2024-11-16 DIAGNOSIS — R197 Diarrhea, unspecified: Secondary | ICD-10-CM | POA: Diagnosis present

## 2024-11-16 LAB — CBC
HCT: 41 % (ref 36.0–46.0)
Hemoglobin: 13.7 g/dL (ref 12.0–15.0)
MCH: 31.7 pg (ref 26.0–34.0)
MCHC: 33.4 g/dL (ref 30.0–36.0)
MCV: 94.9 fL (ref 80.0–100.0)
Platelets: 258 10*3/uL (ref 150–400)
RBC: 4.32 MIL/uL (ref 3.87–5.11)
RDW: 12.4 % (ref 11.5–15.5)
WBC: 11.9 10*3/uL — ABNORMAL HIGH (ref 4.0–10.5)
nRBC: 0 % (ref 0.0–0.2)

## 2024-11-16 LAB — COMPREHENSIVE METABOLIC PANEL WITH GFR
ALT: 8 U/L (ref 0–44)
AST: 17 U/L (ref 15–41)
Albumin: 4.3 g/dL (ref 3.5–5.0)
Alkaline Phosphatase: 96 U/L (ref 38–126)
Anion gap: 13 (ref 5–15)
BUN: 21 mg/dL (ref 8–23)
CO2: 26 mmol/L (ref 22–32)
Calcium: 9.3 mg/dL (ref 8.9–10.3)
Chloride: 99 mmol/L (ref 98–111)
Creatinine, Ser: 0.6 mg/dL (ref 0.44–1.00)
GFR, Estimated: >60 mL/min
Glucose, Bld: 119 mg/dL — ABNORMAL HIGH (ref 70–99)
Potassium: 4 mmol/L (ref 3.5–5.1)
Sodium: 138 mmol/L (ref 135–145)
Total Bilirubin: 0.4 mg/dL (ref 0.0–1.2)
Total Protein: 7.4 g/dL (ref 6.5–8.1)

## 2024-11-16 LAB — CBG MONITORING, ED: Glucose-Capillary: 123 mg/dL — ABNORMAL HIGH (ref 70–99)

## 2024-11-16 LAB — RESP PANEL BY RT-PCR (RSV, FLU A&B, COVID)  RVPGX2
Influenza A by PCR: NEGATIVE
Influenza B by PCR: NEGATIVE
Resp Syncytial Virus by PCR: NEGATIVE
SARS Coronavirus 2 by RT PCR: POSITIVE — AB

## 2024-11-16 MED ORDER — ONDANSETRON HCL 4 MG/2ML IJ SOLN: 4.0000 mg | Freq: Once | INTRAMUSCULAR | Status: DC

## 2024-11-16 MED ORDER — ACETAMINOPHEN 650 MG RE SUPP: 650.0000 mg | Freq: Four times a day (QID) | RECTAL | Status: DC | PRN

## 2024-11-16 MED ORDER — ONDANSETRON HCL 4 MG/2ML IJ SOLN
4.0000 mg | Freq: Four times a day (QID) | INTRAMUSCULAR | Status: DC | PRN
  Filled 2024-11-16: qty 2

## 2024-11-16 MED ORDER — IOHEXOL 350 MG/ML SOLN
75.0000 mL | Freq: Once | INTRAVENOUS | Status: AC | PRN
  Administered 2024-11-16: 75 mL via INTRAVENOUS

## 2024-11-16 MED ORDER — ALBUTEROL SULFATE (2.5 MG/3ML) 0.083% IN NEBU: 2.5000 mg | INHALATION_SOLUTION | Freq: Four times a day (QID) | RESPIRATORY_TRACT | Status: DC | PRN

## 2024-11-16 MED ORDER — ACETAMINOPHEN 325 MG PO TABS
650.0000 mg | ORAL_TABLET | Freq: Four times a day (QID) | ORAL | Status: DC | PRN
  Administered 2024-11-20: 650 mg via ORAL
  Filled 2024-11-16: qty 2

## 2024-11-16 MED ORDER — LACTATED RINGERS IV SOLN: INTRAVENOUS | Status: DC

## 2024-11-16 MED ORDER — MELATONIN 3 MG PO TABS
3.0000 mg | ORAL_TABLET | Freq: Every evening | ORAL | Status: DC | PRN
  Administered 2024-11-17 – 2024-11-19 (×3): 3 mg via ORAL
  Filled 2024-11-16 (×3): qty 1

## 2024-11-16 MED ORDER — BENZONATATE 100 MG PO CAPS
100.0000 mg | ORAL_CAPSULE | Freq: Three times a day (TID) | ORAL | Status: DC | PRN
  Administered 2024-11-17 – 2024-11-19 (×3): 100 mg via ORAL
  Filled 2024-11-16 (×3): qty 1

## 2024-11-16 MED ORDER — SODIUM CHLORIDE 0.9 % IV BOLUS
1000.0000 mL | Freq: Once | INTRAVENOUS | Status: AC
  Administered 2024-11-16: 1000 mL via INTRAVENOUS

## 2024-11-16 NOTE — ED Triage Notes (Signed)
 Pt BIB [facility] coming from home with c/o of diarrhea, lethargy, and nausea. Per EMS, home nurse reports 3 episodes of watery stools today. On arrival, patient reports no pain.  EMS: 110/70 90HR 95% RA 137 CBG

## 2024-11-16 NOTE — ED Provider Notes (Signed)
 " [place] EMERGENCY DEPARTMENT AT [facility] Provider Note   CSN: [id number] Arrival date & time: 11/16/24  1854     Patient presents with: Weakness   Frances Ortiz is a 89 y.o. female.  With a history of cecal cancer s/p resection, GI bleeding hypertension who presents to the ED for diarrhea.  Patient reports ongoing diarrhea over the last several days.  This noticed today.  No bleeding.  Associated generalized weakness, mild abdominal discomfort and nausea no vomiting.  Daughter voices concern for severe weakness as she is too weak to stand.  No recent antibiotics.  Denies fevers chills respiratory symptoms.    Weakness      Prior to Admission medications  Medication Sig Start Date End Date Taking? Authorizing Provider  HYDROcodone -acetaminophen  (NORCO/VICODIN) 5-325 MG tablet Take 1 tablet by mouth in the morning and at bedtime. 08/19/24  Yes [provider]  acetaminophen  (TYLENOL ) 650 MG CR tablet Take 650 mg by mouth in the morning, at noon, in the evening, and at bedtime.    [provider]  docusate sodium  (COLACE) 100 MG capsule Take 1 capsule (100 mg total) by mouth 2 (two) times daily. Patient not taking: Reported on 02/23/2024 06/25/23   Singh, Prashant K, MD  feeding supplement (ENSURE ENLIVE / ENSURE PLUS) LIQD Take 237 mLs by mouth 2 (two) times daily between meals. 02/27/24   Krishnan, Gokul, MD  Metamucil Fiber CHEW Chew 3 tablets by mouth daily.    [provider]  methocarbamol  (ROBAXIN ) 500 MG tablet Take 1 tablet (500 mg total) by mouth every 8 (eight) hours as needed for muscle spasms. Patient not taking: Reported on 11/16/2024 02/27/24   Krishnan, Gokul, MD  oxyCODONE  (OXY IR/ROXICODONE ) 5 MG immediate release tablet Take 1 tablet (5 mg total) by mouth every 6 (six) hours as needed for severe pain (pain score 7-10). 02/27/24   Krishnan, Gokul, MD  sertraline  (ZOLOFT ) 50 MG tablet Take 50 mg by mouth daily.    [provider]   traMADol  (ULTRAM ) 50 MG tablet Take 1 tablet (50 mg total) by mouth every 8 (eight) hours as needed for moderate pain (pain score 4-6). 02/27/24   Krishnan, Gokul, MD    Allergies: Aristocort  [triamcinolone ], Cortisone, Erythromycin, and Hydrocodone -acetaminophen     Review of Systems  Neurological:  Positive for weakness.    Updated Vital Signs BP 133/71   Pulse 89   Temp 98.9 F (37.2 C)   Resp 16   SpO2 93%   Physical Exam Vitals and nursing note reviewed.  HENT:     Head: Normocephalic and atraumatic.  Eyes:     Pupils: Pupils are equal, round, and reactive to light.  Cardiovascular:     Rate and Rhythm: Normal rate and regular rhythm.  Pulmonary:     Effort: Pulmonary effort is normal.     Breath sounds: Normal breath sounds.  Abdominal:     Palpations: Abdomen is soft.     Tenderness: There is abdominal tenderness. There is no guarding or rebound.     Comments: Periumbilical tenderness  Skin:    General: Skin is warm and dry.  Neurological:     Mental Status: She is alert.  Psychiatric:        Mood and Affect: Mood normal.     (all labs ordered are listed, but only abnormal results are displayed) Labs Reviewed  RESP PANEL BY RT-PCR (RSV, FLU A&B, COVID)  RVPGX2 - Abnormal; Notable for the following  components:      Result Value   SARS Coronavirus 2 by RT PCR POSITIVE (*)    All other components within normal limits  COMPREHENSIVE METABOLIC PANEL WITH GFR - Abnormal; Notable for the following components:   Glucose, Bld 119 (*)    All other components within normal limits  CBC - Abnormal; Notable for the following components:   WBC 11.9 (*)    All other components within normal limits  CBG MONITORING, ED - Abnormal; Notable for the following components:   Glucose-Capillary 123 (*)    All other components within normal limits  URINALYSIS, ROUTINE W REFLEX MICROSCOPIC  CBC WITH DIFFERENTIAL/PLATELET  COMPREHENSIVE METABOLIC PANEL WITH GFR  MAGNESIUM    MAGNESIUM   PHOSPHORUS  C-REACTIVE PROTEIN  C-REACTIVE PROTEIN  PROCALCITONIN  VITAMIN B12  TSH  T4, FREE    EKG: EKG Interpretation Date/Time:  Monday November 16 2024 19:32:05 EST Ventricular Rate:  97 PR Interval:  188 QRS Duration:  87 QT Interval:  359 QTC Calculation: 456 R Axis:   -37  Text Interpretation: Sinus rhythm Left axis deviation Confirmed by Pamella Sharper [phone]) on 11/16/2024 9:18:59 PM  Radiology: CT ABDOMEN PELVIS W CONTRAST Result Date: 11/16/2024 EXAM: CT ABDOMEN AND PELVIS WITH CONTRAST 11/16/2024 10:21:03 PM TECHNIQUE: CT of the abdomen and pelvis was performed with the administration of intravenous contrast. Multiplanar reformatted images are provided for review. Automated exposure control, iterative reconstruction, and/or weight-based adjustment of the mA/kV was utilized to reduce the radiation dose to as low as reasonably achievable. COMPARISON: 08/11/2024. CLINICAL HISTORY: Clinical history of colon carcinoma with abdominal pain and diarrhea. FINDINGS: LOWER CHEST: No acute abnormality. LIVER: The liver is unremarkable. GALLBLADDER AND BILE DUCTS: The gallbladder has been surgically removed. No biliary ductal dilatation. SPLEEN: No acute abnormality. PANCREAS: No acute abnormality. ADRENAL GLANDS: Right adrenal adenoma is noted to be stable in appearance from the prior exam. This is stable, dating back to 2020. KIDNEYS, URETERS AND BLADDER: Parapelvic cysts are noted, greater on the left than the right. Additional left cortical cysts are seen. Nonobstructing stones are noted in the lower pole of the left kidney. The largest of these measures 5 mm in dimension. The overall appearance is stable. No obstructive changes are noted. No hydronephrosis. No perinephric or periureteral stranding. The bladder is partially distended. GI AND BOWEL: The stomach is within normal limits. Scattered diverticular change of the colon is noted. Changes consistent with prior right  colectomy are again noted with patent small bowel to colonic anastomosis. No small bowel obstructive changes are seen. There is no bowel obstruction. PERITONEUM AND RETROPERITONEUM: No ascites. No free air. VASCULATURE: Aortic calcifications are seen without aneurysmal dilatation. LYMPH NODES: No lymphadenopathy. REPRODUCTIVE ORGANS: No acute abnormality. BONES AND SOFT TISSUES: Postsurgical changes are noted in the proximal femurs bilaterally. Degenerative changes of the lumbar spine are seen. Changes of prior vertebral augmentation are noted at L2 and L3, chronic compression deformities of T12, L1, L4, and L5 are again noted. No acute osseous abnormality. No focal soft tissue abnormality. IMPRESSION: 1. No acute findings in the abdomen or pelvis to account for abdominal pain and diarrhea. 2. Stable postsurgical changes of prior right colectomy with patent small bowel to colonic anastomosis and no small bowel obstruction. 3. No evidence of metastatic disease. Electronically signed by: Oneil Devonshire MD 11/16/2024 10:27 PM EST RP Workstation: [id number]   DG Chest Portable 1 View Result Date: 11/16/2024 EXAM: 1 VIEW(S) XRAY OF THE CHEST 11/16/2024 07:20:00 PM COMPARISON: 02/23/2024 CLINICAL  HISTORY: Query pneumonia. FINDINGS: LUNGS AND PLEURA: Low lung volumes. No focal pulmonary opacity. No pleural effusion. No pneumothorax. HEART AND MEDIASTINUM: Aortic atherosclerosis. No acute abnormality of the cardiac silhouette. BONES AND SOFT TISSUES: Surgical clips in right upper quadrant. Diffuse osteopenia. Vertebroplasty changes in lumbar spine. No acute osseous abnormality. IMPRESSION: 1. No acute cardiopulmonary abnormality, including no focal airspace consolidation to suggest pneumonia. Electronically signed by: Elsie Gravely MD 11/16/2024 07:29 PM EST RP Workstation: [id number]     Procedures   Medications Ordered in the ED  ondansetron  (ZOFRAN ) injection 4 mg (0 mg Intravenous Hold 11/16/24 1930)   acetaminophen  (TYLENOL ) tablet 650 mg (has no administration in time range)    Or  acetaminophen  (TYLENOL ) suppository 650 mg (has no administration in time range)  melatonin tablet 3 mg (has no administration in time range)  ondansetron  (ZOFRAN ) injection 4 mg (has no administration in time range)  albuterol  (PROVENTIL ) (2.5 MG/3ML) 0.083% nebulizer solution 2.5 mg (has no administration in time range)  benzonatate  (TESSALON ) capsule 100 mg (has no administration in time range)  sodium chloride  0.9 % bolus 1,000 mL (0 mLs Intravenous Stopped 11/16/24 2241)  iohexol  (OMNIPAQUE ) 350 MG/ML injection 75 mL (75 mLs Intravenous Contrast Given 11/16/24 2219)    Clinical Course as of 11/16/24 2242  Mon Nov 16, 2024  2239  Workup shows no acute evidence of intra-abdominal infection on CT abdomen pelvis.  Slight leukocytosis of 11.9.  COVID-positive.  Most likely etiology for her symptoms weakness diarrhea would be acute COVID infection.  Discussed with patient's daughter over the phone who is concerned that she lives alone and is profoundly weak.  Will admit to medicine. [MP]    Clinical Course User Index [MP] Pamella Ozell LABOR, DO                                 Medical Decision Making 89 year old female presenting for generalized weakness abdominal pain dehydration diarrhea.  Afebrile normotensive here.  Abdominal exam notable for periumbilical tenderness without rebound rigidity or guarding.    Differential diagnosis includes: Acute intra-abdominal infectious/inflammatory process such as appendicitis, diverticulitis, pancreatitis and cholecystitis Urinary tract infection Atypical presentation for pneumonia Viral gastroenteritis  Will obtain laboratory workup including CBC with differential, metabolic panel, lipase and urinalysis  Amount and/or Complexity of Data Reviewed Labs: ordered. Radiology: ordered.  Risk Prescription drug management. Decision regarding  hospitalization.        Final diagnoses:  COVID-19  Weakness  Dehydration    ED Discharge Orders     None          Pamella Ozell LABOR, DO 11/16/24 2242  "

## 2024-11-16 NOTE — ED Notes (Signed)
 When you have time, Landry Barnacle (daughter) [phone] would like an update on pt. Status. Thank you

## 2024-11-16 NOTE — H&P (Incomplete)
 " History and Physical      Frances Ortiz FMW:[id number] DOB: [birth year] DOA: 11/16/2024; DOS: 11/16/2024  PCP: Leonel Cole, MD *** Patient coming from: home ***  I have personally briefly reviewed patient's old medical records in [facility]  Chief Complaint: ***  HPI: Frances Ortiz is a 89 y.o. female with medical history significant for *** who is admitted to [facility] on 11/16/2024 with *** after presenting from home*** to [facility] ED complaining of ***.   ***       ***   ED Course:  Vital signs in the ED were notable for the following: ***  Labs were notable for the following: ***  Per my interpretation, EKG in ED demonstrated the following:  ***  Imaging in the ED, per corresponding formal radiology read, was notable for the following:  ***  While in the ED, the following were administered: ***  Subsequently, the patient was admitted  ***  ***red    Review of Systems: As per HPI otherwise 10 point review of systems negative.   Past Medical History:  Diagnosis Date   Anxiety    GERD (gastroesophageal reflux disease)    Heart murmur    HLD (hyperlipidemia)    Intestinal obstruction (HCC)    Osteoporosis    PONV (postoperative nausea and vomiting)    Skin cancer    melanoma, basal cell, and squamous    Past Surgical History:  Procedure Laterality Date   ANTERIOR APPROACH HEMI HIP ARTHROPLASTY Right 06/22/2023   Procedure: POSTERIOR APPROACH HEMI HIP ARTHROPLASTY;  Surgeon: Josefina Chew, MD;  Location: [facility] OR;  Service: Orthopedics;  Laterality: Right;   BIOPSY  07/14/2023   Procedure: BIOPSY;  Surgeon: Elicia Claw, MD;  Location: [facility] ENDOSCOPY;  Service: Gastroenterology;;   BREAST BIOPSY     bilateral   BREAST EXCISIONAL BIOPSY Right 30 years ago   fibroadenoma   BREAST EXCISIONAL BIOPSY Left 40 years ago   sclerosis thickening   CATARACT EXTRACTION W/ INTRAOCULAR LENS  IMPLANT, BILATERAL Bilateral    CESAREAN SECTION     x 2    CHOLECYSTECTOMY N/A 11/27/2012   Procedure: LAPAROSCOPIC CHOLECYSTECTOMY WITH INTRAOPERATIVE CHOLANGIOGRAM;  Surgeon: Deward GORMAN Curvin DOUGLAS, MD;  Location: [facility] OR;  Service: General;  Laterality: N/A;   COLECTOMY Right 10/07/2017   right   COLONOSCOPY  2005   COLONOSCOPY  08/2017   found mass in cecum   DILATION AND CURETTAGE OF UTERUS     ESOPHAGOGASTRODUODENOSCOPY     ESOPHAGOGASTRODUODENOSCOPY (EGD) WITH PROPOFOL  N/A 07/14/2023   Procedure: ESOPHAGOGASTRODUODENOSCOPY (EGD) WITH PROPOFOL ;  Surgeon: Elicia Claw, MD;  Location: [facility] ENDOSCOPY;  Service: Gastroenterology;  Laterality: N/A;   INTRAMEDULLARY (IM) NAIL INTERTROCHANTERIC Left 02/24/2024   Procedure: FIXATION, FRACTURE, INTERTROCHANTERIC, WITH INTRAMEDULLARY ROD;  Surgeon: Celena Sharper, MD;  Location: [facility] OR;  Service: Orthopedics;  Laterality: Left;   IR KYPHO EA ADDL LEVEL THORACIC OR LUMBAR  02/27/2023   IR KYPHO LUMBAR INC FX REDUCE BONE BX UNI/BIL CANNULATION INC/IMAGING  02/27/2023   LAPAROSCOPIC RIGHT COLECTOMY Right 10/07/2017   Procedure: LAPAROSCOPIC RIGHT COLECTOMY ERAS PATHWAY;  Surgeon: Curvin Deward DOUGLAS, MD;  Location: [facility] OR;  Service: General;  Laterality: Right;   SKIN CANCER EXCISION     TONSILLECTOMY      Social History:  reports that she has never smoked. She has never used smokeless tobacco. She reports that she does not drink alcohol and does not use drugs.   Allergies[1]  Family History  Problem Relation Age of Onset   Pneumonia Mother    Colon cancer Neg Hx    Colon polyps Neg Hx    Rectal cancer Neg Hx    Stomach cancer Neg Hx     Family history reviewed and not pertinent ***   Prior to Admission medications  Medication Sig Start Date End Date Taking? Authorizing Provider  HYDROcodone -acetaminophen  (NORCO/VICODIN) 5-325 MG tablet Take 1 tablet by mouth in the morning and at bedtime. 08/19/24  Yes [provider]  acetaminophen  (TYLENOL ) 650 MG CR tablet Take 650 mg by mouth in the morning, at  noon, in the evening, and at bedtime.    [provider]  docusate sodium  (COLACE) 100 MG capsule Take 1 capsule (100 mg total) by mouth 2 (two) times daily. Patient not taking: Reported on 02/23/2024 06/25/23   Singh, Prashant K, MD  feeding supplement (ENSURE ENLIVE / ENSURE PLUS) LIQD Take 237 mLs by mouth 2 (two) times daily between meals. 02/27/24   Krishnan, Gokul, MD  Metamucil Fiber CHEW Chew 3 tablets by mouth daily.    [provider]  methocarbamol  (ROBAXIN ) 500 MG tablet Take 1 tablet (500 mg total) by mouth every 8 (eight) hours as needed for muscle spasms. Patient not taking: Reported on 11/16/2024 02/27/24   Krishnan, Gokul, MD  oxyCODONE  (OXY IR/ROXICODONE ) 5 MG immediate release tablet Take 1 tablet (5 mg total) by mouth every 6 (six) hours as needed for severe pain (pain score 7-10). 02/27/24   Krishnan, Gokul, MD  sertraline  (ZOLOFT ) 50 MG tablet Take 50 mg by mouth daily.    [provider]  traMADol  (ULTRAM ) 50 MG tablet Take 1 tablet (50 mg total) by mouth every 8 (eight) hours as needed for moderate pain (pain score 4-6). 02/27/24   Verdene Purchase, MD     Objective    Physical Exam: Vitals:   11/16/24 2030 11/16/24 2045 11/16/24 2100 11/16/24 2115  BP: 120/63 133/69 (!) 144/73 133/71  Pulse: 92 91 91 89  Resp: 16 14 14 16   Temp:      SpO2: 94% 91% 93% 93%    General: appears to be stated age; alert, oriented Skin: warm, dry, no rash Head:  AT/[place] Mouth:  Oral mucosa membranes appear moist, normal dentition Neck: supple; trachea midline Heart:  RRR; did not appreciate any M/R/G Lungs: CTAB, did not appreciate any wheezes, rales, or rhonchi Abdomen: + BS; soft, ND, NT Vascular: 2+ pedal pulses b/l; 2+ radial pulses b/l Extremities: no peripheral edema, no muscle wasting   ***    *** Neuro: strength and sensation intact in upper and lower extremities b/l Neuro: 5/5 strength of the proximal and distal flexors and extensors of the upper and  lower extremities bilaterally; sensation intact in upper and lower extremities b/l; cranial nerves II through XII grossly intact; no pronator drift; no evidence suggestive of slurred speech, dysarthria, or facial droop; Normal muscle tone. No tremors. Neuro: In the setting of the patient's current mental status and associated inability to follow instructions, unable to perform full neurologic exam at this time.  As such, assessment of strength, sensation, and cranial nerves is limited at this time. Patient noted to spontaneously move all 4 extremities. No tremors.  ***      Labs on Admission: I have personally reviewed following labs and imaging studies  CBC: Recent Labs  Lab 11/16/24 1905  WBC 11.9*  HGB 13.7  HCT 41.0  MCV 94.9  PLT 258   Basic  Metabolic Panel: Recent Labs  Lab 11/16/24 1905  NA 138  K 4.0  CL 99  CO2 26  GLUCOSE 119*  BUN 21  CREATININE 0.60  CALCIUM 9.3   GFR: CrCl cannot be calculated (Unknown ideal weight.). Liver Function Tests: Recent Labs  Lab 11/16/24 1905  AST 17  ALT 8  ALKPHOS 96  BILITOT 0.4  PROT 7.4  ALBUMIN 4.3   No results for input(s): LIPASE, AMYLASE in the last 168 hours. No results for input(s): AMMONIA in the last 168 hours. Coagulation Profile: No results for input(s): INR, PROTIME in the last 168 hours. Cardiac Enzymes: No results for input(s): CKTOTAL, CKMB, CKMBINDEX, TROPONINI in the last 168 hours. BNP (last 3 results) No results for input(s): PROBNP in the last 8760 hours. HbA1C: No results for input(s): HGBA1C in the last 72 hours. CBG: Recent Labs  Lab 11/16/24 1940  GLUCAP 123*   Lipid Profile: No results for input(s): CHOL, HDL, LDLCALC, TRIG, CHOLHDL, LDLDIRECT in the last 72 hours. Thyroid  Function Tests: No results for input(s): TSH, T4TOTAL, FREET4, T3FREE, THYROIDAB in the last 72 hours. Anemia Panel: No results for input(s): VITAMINB12, FOLATE,  FERRITIN, TIBC, IRON, RETICCTPCT in the last 72 hours. Urine analysis:    Component Value Date/Time   COLORURINE YELLOW 09/13/2023 1912   APPEARANCEUR CLEAR 09/13/2023 1912   LABSPEC 1.025 09/13/2023 1912   PHURINE 5.5 09/13/2023 1912   GLUCOSEU NEGATIVE 09/13/2023 1912   HGBUR NEGATIVE 09/13/2023 1912   BILIRUBINUR NEGATIVE 09/13/2023 1912   KETONESUR 40 (A) 09/13/2023 1912   PROTEINUR 30 (A) 09/13/2023 1912   NITRITE POSITIVE (A) 09/13/2023 1912   LEUKOCYTESUR LARGE (A) 09/13/2023 1912    Radiological Exams on Admission: CT ABDOMEN PELVIS W CONTRAST Result Date: 11/16/2024 EXAM: CT ABDOMEN AND PELVIS WITH CONTRAST 11/16/2024 10:21:03 PM TECHNIQUE: CT of the abdomen and pelvis was performed with the administration of intravenous contrast. Multiplanar reformatted images are provided for review. Automated exposure control, iterative reconstruction, and/or weight-based adjustment of the mA/kV was utilized to reduce the radiation dose to as low as reasonably achievable. COMPARISON: 08/11/2024. CLINICAL HISTORY: Clinical history of colon carcinoma with abdominal pain and diarrhea. FINDINGS: LOWER CHEST: No acute abnormality. LIVER: The liver is unremarkable. GALLBLADDER AND BILE DUCTS: The gallbladder has been surgically removed. No biliary ductal dilatation. SPLEEN: No acute abnormality. PANCREAS: No acute abnormality. ADRENAL GLANDS: Right adrenal adenoma is noted to be stable in appearance from the prior exam. This is stable, dating back to 2020. KIDNEYS, URETERS AND BLADDER: Parapelvic cysts are noted, greater on the left than the right. Additional left cortical cysts are seen. Nonobstructing stones are noted in the lower pole of the left kidney. The largest of these measures 5 mm in dimension. The overall appearance is stable. No obstructive changes are noted. No hydronephrosis. No perinephric or periureteral stranding. The bladder is partially distended. GI AND BOWEL: The stomach is  within normal limits. Scattered diverticular change of the colon is noted. Changes consistent with prior right colectomy are again noted with patent small bowel to colonic anastomosis. No small bowel obstructive changes are seen. There is no bowel obstruction. PERITONEUM AND RETROPERITONEUM: No ascites. No free air. VASCULATURE: Aortic calcifications are seen without aneurysmal dilatation. LYMPH NODES: No lymphadenopathy. REPRODUCTIVE ORGANS: No acute abnormality. BONES AND SOFT TISSUES: Postsurgical changes are noted in the proximal femurs bilaterally. Degenerative changes of the lumbar spine are seen. Changes of prior vertebral augmentation are noted at L2 and L3, chronic compression deformities of T12,  L1, L4, and L5 are again noted. No acute osseous abnormality. No focal soft tissue abnormality. IMPRESSION: 1. No acute findings in the abdomen or pelvis to account for abdominal pain and diarrhea. 2. Stable postsurgical changes of prior right colectomy with patent small bowel to colonic anastomosis and no small bowel obstruction. 3. No evidence of metastatic disease. Electronically signed by: Oneil Devonshire MD 11/16/2024 10:27 PM EST RP Workstation: [id number]   DG Chest Portable 1 View Result Date: 11/16/2024 EXAM: 1 VIEW(S) XRAY OF THE CHEST 11/16/2024 07:20:00 PM COMPARISON: 02/23/2024 CLINICAL HISTORY: Query pneumonia. FINDINGS: LUNGS AND PLEURA: Low lung volumes. No focal pulmonary opacity. No pleural effusion. No pneumothorax. HEART AND MEDIASTINUM: Aortic atherosclerosis. No acute abnormality of the cardiac silhouette. BONES AND SOFT TISSUES: Surgical clips in right upper quadrant. Diffuse osteopenia. Vertebroplasty changes in lumbar spine. No acute osseous abnormality. IMPRESSION: 1. No acute cardiopulmonary abnormality, including no focal airspace consolidation to suggest pneumonia. Electronically signed by: Elsie Gravely MD 11/16/2024 07:29 PM EST RP Workstation: [id number]      Assessment/Plan    Principal Problem:   COVID-19 virus infection   ***            ***                     ***                      ***                     ***                     ***                     ***                      ***                     ***                     ***                     ***                     ***                    ***                   ***  DVT prophylaxis: SCD's ***  Code Status: Full code*** Family Communication: none*** Disposition Plan: Per Rounding Team Consults called: none***;  Admission status: ***     I SPENT GREATER THAN 75 *** MINUTES IN CLINICAL CARE TIME/MEDICAL DECISION-MAKING IN COMPLETING THIS ADMISSION.      Eva NOVAK Emmilynn Marut DO [facility]  From 7PM - 7AM   11/16/2024, 10:43 PM   ***     [1]  Allergies Allergen Reactions   Aristocort  [Triamcinolone ] Other (See Comments)    Emotional instability   Cortisone Other (See Comments)    Emotional instability   Erythromycin Other (See Comments)    Stomach pain    Hydrocodone -Acetaminophen  Rash   "

## 2024-11-17 ENCOUNTER — Encounter (HOSPITAL_COMMUNITY): Payer: Self-pay | Admitting: Internal Medicine

## 2024-11-17 DIAGNOSIS — F411 Generalized anxiety disorder: Secondary | ICD-10-CM | POA: Diagnosis present

## 2024-11-17 DIAGNOSIS — R197 Diarrhea, unspecified: Secondary | ICD-10-CM | POA: Diagnosis present

## 2024-11-17 DIAGNOSIS — E860 Dehydration: Secondary | ICD-10-CM | POA: Diagnosis present

## 2024-11-17 DIAGNOSIS — R531 Weakness: Secondary | ICD-10-CM

## 2024-11-17 DIAGNOSIS — N19 Unspecified kidney failure: Secondary | ICD-10-CM | POA: Diagnosis present

## 2024-11-17 LAB — CBC WITH DIFFERENTIAL/PLATELET
Abs Immature Granulocytes: 0.02 10*3/uL (ref 0.00–0.07)
Basophils Absolute: 0 10*3/uL (ref 0.0–0.1)
Basophils Relative: 1 %
Eosinophils Absolute: 0.1 10*3/uL (ref 0.0–0.5)
Eosinophils Relative: 1 %
HCT: 35.3 % — ABNORMAL LOW (ref 36.0–46.0)
Hemoglobin: 12 g/dL (ref 12.0–15.0)
Immature Granulocytes: 0 %
Lymphocytes Relative: 21 %
Lymphs Abs: 1.4 10*3/uL (ref 0.7–4.0)
MCH: 32.3 pg (ref 26.0–34.0)
MCHC: 34 g/dL (ref 30.0–36.0)
MCV: 95.1 fL (ref 80.0–100.0)
Monocytes Absolute: 1.1 10*3/uL — ABNORMAL HIGH (ref 0.1–1.0)
Monocytes Relative: 16 %
Neutro Abs: 4 10*3/uL (ref 1.7–7.7)
Neutrophils Relative %: 61 %
Platelets: 221 10*3/uL (ref 150–400)
RBC: 3.71 MIL/uL — ABNORMAL LOW (ref 3.87–5.11)
RDW: 12.8 % (ref 11.5–15.5)
WBC: 6.6 10*3/uL (ref 4.0–10.5)
nRBC: 0 % (ref 0.0–0.2)

## 2024-11-17 LAB — MAGNESIUM
Magnesium: 1.8 mg/dL (ref 1.7–2.4)
Magnesium: 2 mg/dL (ref 1.7–2.4)

## 2024-11-17 LAB — COMPREHENSIVE METABOLIC PANEL WITH GFR
ALT: 8 U/L (ref 0–44)
AST: 17 U/L (ref 15–41)
Albumin: 3.8 g/dL (ref 3.5–5.0)
Alkaline Phosphatase: 80 U/L (ref 38–126)
Anion gap: 9 (ref 5–15)
BUN: 20 mg/dL (ref 8–23)
CO2: 27 mmol/L (ref 22–32)
Calcium: 8.8 mg/dL — ABNORMAL LOW (ref 8.9–10.3)
Chloride: 103 mmol/L (ref 98–111)
Creatinine, Ser: 0.58 mg/dL (ref 0.44–1.00)
GFR, Estimated: >60 mL/min
Glucose, Bld: 111 mg/dL — ABNORMAL HIGH (ref 70–99)
Potassium: 3.9 mmol/L (ref 3.5–5.1)
Sodium: 138 mmol/L (ref 135–145)
Total Bilirubin: 0.3 mg/dL (ref 0.0–1.2)
Total Protein: 6.3 g/dL — ABNORMAL LOW (ref 6.5–8.1)

## 2024-11-17 LAB — URINALYSIS, ROUTINE W REFLEX MICROSCOPIC
Bilirubin Urine: NEGATIVE
Glucose, UA: NEGATIVE mg/dL
Hgb urine dipstick: NEGATIVE
Ketones, ur: NEGATIVE mg/dL
Nitrite: NEGATIVE
Protein, ur: NEGATIVE mg/dL
Specific Gravity, Urine: 1.028 (ref 1.005–1.030)
pH: 5 (ref 5.0–8.0)

## 2024-11-17 LAB — PHOSPHORUS: Phosphorus: 3.3 mg/dL (ref 2.5–4.6)

## 2024-11-17 LAB — TSH: TSH: 0.375 u[IU]/mL (ref 0.350–4.500)

## 2024-11-17 LAB — C-REACTIVE PROTEIN
CRP: 2.4 mg/dL — ABNORMAL HIGH
CRP: 3.3 mg/dL — ABNORMAL HIGH

## 2024-11-17 LAB — VITAMIN B12: Vitamin B-12: 243 pg/mL (ref 180–914)

## 2024-11-17 LAB — T4, FREE: Free T4: 1.17 ng/dL (ref 0.80–2.00)

## 2024-11-17 LAB — PROCALCITONIN: Procalcitonin: 0.12 ng/mL

## 2024-11-17 MED ORDER — HYDROCODONE-ACETAMINOPHEN 5-325 MG PO TABS
1.0000 | ORAL_TABLET | Freq: Two times a day (BID) | ORAL | Status: DC
  Administered 2024-11-17 – 2024-11-20 (×7): 1 via ORAL
  Filled 2024-11-17 (×7): qty 1

## 2024-11-17 MED ORDER — NYSTATIN 100000 UNIT/GM EX POWD
Freq: Two times a day (BID) | CUTANEOUS | Status: DC
  Filled 2024-11-17: qty 15

## 2024-11-17 MED ORDER — SERTRALINE HCL 25 MG PO TABS
50.0000 mg | ORAL_TABLET | Freq: Every day | ORAL | Status: DC
  Administered 2024-11-17 – 2024-11-20 (×4): 50 mg via ORAL
  Filled 2024-11-17: qty 2
  Filled 2024-11-17: qty 1
  Filled 2024-11-17 (×2): qty 2

## 2024-11-17 NOTE — Plan of Care (Signed)

## 2024-11-17 NOTE — Plan of Care (Signed)
   Problem: Education: Goal: Knowledge of risk factors and measures for prevention of condition will improve Outcome: Progressing   Problem: Coping: Goal: Psychosocial and spiritual needs will be supported Outcome: Progressing   Problem: Education: Goal: Knowledge of General Education information will improve Description: Including pain rating scale, medication(s)/side effects and non-pharmacologic comfort measures Outcome: Progressing   Problem: Health Behavior/Discharge Planning: Goal: Ability to manage health-related needs will improve Outcome: Progressing

## 2024-11-17 NOTE — Progress Notes (Signed)
 PT Cancellation Note  Patient Details Name: Frances Ortiz MRN: [id number] DOB: 03-Nov-1930   Cancelled Treatment:    Reason Eval/Treat Not Completed: Other (comment). Pt on bedpan and then eating lunch. Will continue attempts.   Rodgers LELON [facility] 11/17/2024, 1:40 PM Rodgers Opal PT Acute [employer] [phone]

## 2024-11-17 NOTE — Evaluation (Signed)
 Occupational Therapy Evaluation Patient Details Name: Frances Ortiz MRN: [id number] DOB: Mar 29, 1931 Today's Date: 11/17/2024   History of Present Illness   NAN Frances Ortiz is a 89 y.o. female who presents to the ED for diarrhea and weakness. Found to have COVID-19. PMH: hyperlipidemia, anxiety/depression, cecal cancer s/p R colectomy 2018, GERD, osteoporosis.     Clinical Impressions [name] was evaluated s/p the above admission list. She needs assist for all aspects of mobility and ADLs at baseline. Per pt she has caregivers 4 hours twice daily. Upon evaluation the pt was limited by weakness, back pain, fear of falling, unsteady balance and limited activity tolerance. Overall she needed max A for bed mobility and was not able to stand from elevated stretcher height. Due to the deficits listed below the pt also needs total A for LB ADLs and min A for UB ADLs in sitting. Pt will benefit from continued acute OT services and skilled inpatient follow up therapy, <3 hours/day.       If plan is discharge home, recommend the following:   A lot of help with walking and/or transfers;A lot of help with bathing/dressing/bathroom;Assistance with cooking/housework;Assist for transportation;Help with stairs or ramp for entrance     Functional Status Assessment   Patient has had a recent decline in their functional status and demonstrates the ability to make significant improvements in function in a reasonable and predictable amount of time.     Equipment Recommendations   None recommended by OT      Precautions/Restrictions   Precautions Precautions: Fall Restrictions Weight Bearing Restrictions Per Provider Order: No     Mobility Bed Mobility Overal bed mobility: Needs Assistance Bed Mobility: Supine to Sit, Sit to Supine     Supine to sit: Max assist Sit to supine: Max assist   General bed mobility comments: pt states she had assist for bed mobility at baseline     Transfers Overall transfer level: Needs assistance                 General transfer comment: pt was too short to step down from elevated stretcher height      Balance Overall balance assessment: Needs assistance Sitting-balance support: Bilateral upper extremity supported Sitting balance-Leahy Scale: Fair           ADL either performed or assessed with clinical judgement   ADL Overall ADL's : Needs assistance/impaired Eating/Feeding: Sitting;Independent   Grooming: Set up;Sitting   Upper Body Bathing: Minimal assistance;Sitting   Lower Body Bathing: Bed level;Maximal assistance;+2 for safety/equipment   Upper Body Dressing : Minimal assistance;Sitting   Lower Body Dressing: Total assistance;+2 for physical assistance;+2 for safety/equipment;Bed level   Toilet Transfer: Total assistance;+2 for physical assistance;+2 for safety/equipment Toilet Transfer Details (indicate cue type and reason): brief Toileting- Clothing Manipulation and Hygiene: +2 for physical assistance;Total assistance;+2 for safety/equipment;Bed level       Functional mobility during ADLs: Maximal assistance (bed mobility) General ADL Comments: limited by weakness, back pain and fear of falling. pt was evaluated on ED stretcher and was unable to stand from elevated surface. max A needed to get to/from EOB. fair sitting balance once sitting     Vision Baseline Vision/History: 0 No visual deficits Vision Assessment?: No apparent visual deficits     Perception Perception: Within Functional Limits       Praxis Praxis: WFL       Pertinent Vitals/Pain Pain Assessment Pain Assessment: Faces Faces Pain Scale: Hurts a little bit Pain Location: low back,  chronic Pain Descriptors / Indicators: Discomfort Pain Intervention(s): Limited activity within patient's tolerance, Monitored during session     Extremity/Trunk Assessment Upper Extremity Assessment Upper Extremity Assessment:  Generalized weakness   Lower Extremity Assessment Lower Extremity Assessment: Defer to PT evaluation   Cervical / Trunk Assessment Cervical / Trunk Assessment: Kyphotic (mild)   Communication Communication Communication: No apparent difficulties   Cognition Arousal: Alert Behavior During Therapy: WFL for tasks assessed/performed Cognition: No apparent impairments             OT - Cognition Comments: anticipate baseline, pt mildly perseveratory on how she got covid                 Following commands: Intact       Cueing  General Comments   Cueing Techniques: Verbal cues  VSS           Home Living Family/patient expects to be discharged to:: Private residence Living Arrangements: Alone Available Help at Discharge: Personal care attendant;Available PRN/intermittently Type of Home: House Home Access: Level entry     Home Layout: One level     Bathroom Shower/Tub: Producer, Television/film/video: Standard Bathroom Accessibility: Yes   Home Equipment: Shower seat;Cane - single point;Rollator (4 wheels);Rolling Walker (2 wheels);Lift chair   Additional Comments: caregivers 4 hours morning and night 7 days/wk      Prior Functioning/Environment Prior Level of Function : Needs assist;History of Falls (last six months)             Mobility Comments: PCA with patinet for all aspects of mobility and ADLs, pt uses a RW in the home and a rollator outside of the home. Caregivers help her get in/out of bed, to stand and turn to recliner. Pt reports she does not typically walk with RW more than about 24ft at a time ADLs Comments: PCA assists with all aspects of ADLs, pt uses brief for toileting and PCA assists with hygiene in the bathroom. PCA proivde all meals and med mgmt    OT Problem List: Decreased strength;Decreased range of motion;Decreased activity tolerance;Impaired balance (sitting and/or standing);Decreased safety awareness;Decreased knowledge of  use of DME or AE;Decreased knowledge of precautions;Pain   OT Treatment/Interventions: Self-care/ADL training;Therapeutic exercise;DME and/or AE instruction;Therapeutic activities;Patient/family education;Balance training      OT Goals(Current goals can be found in the care plan section)   Acute Rehab OT Goals Patient Stated Goal: home OT Goal Formulation: With patient Time For Goal Achievement: 12/01/24 Potential to Achieve Goals: Good ADL Goals Pt Will Perform Lower Body Dressing: with mod assist;sit to/from stand Pt Will Transfer to Toilet: with min assist;ambulating Additional ADL Goal #1: Pt will complete bed mobility with min A as a precursor to ADLs   OT Frequency:  Min 2X/week       AM-PAC OT 6 Clicks Daily Activity     Outcome Measure Help from another person eating meals?: None Help from another person taking care of personal grooming?: A Little Help from another person toileting, which includes using toliet, bedpan, or urinal?: Total Help from another person bathing (including washing, rinsing, drying)?: A Lot Help from another person to put on and taking off regular upper body clothing?: A Little Help from another person to put on and taking off regular lower body clothing?: Total 6 Click Score: 14   End of Session Nurse Communication: Mobility status  Activity Tolerance: Patient tolerated treatment well Patient left: in bed;with call bell/phone within reach  OT Visit Diagnosis: Unsteadiness  on feet (R26.81);Other abnormalities of gait and mobility (R26.89);Muscle weakness (generalized) (M62.81);History of falling (Z91.81);Pain                Time: 8984-8962 OT Time Calculation (min): 22 min Charges:  OT General Charges $OT Visit: 1 Visit OT Evaluation $OT Eval Moderate Complexity: 1 Mod  Lucie Kendall, OTR/L Acute Rehabilitation Services Office [phone] Secure Chat Communication Preferred   Lucie JONETTA Kendall 11/17/2024, 11:09 AM

## 2024-11-17 NOTE — ED Notes (Signed)
 CCMD called for monitoring

## 2024-11-17 NOTE — Progress Notes (Signed)
 " PROGRESS NOTE    Frances Ortiz  FMW:[id number] DOB: 06-12-1931 DOA: 11/16/2024 PCP: Leonel Cole, MD  Subjective: Son at the bedside.  Patient feels well.  No diarrhea today.  Denies cough, sore throat, anosmia, shortness of breath.  Has chronic back pain unchanged.  Denies abdominal or chest pain.  Hospital Course:    Assessment and Plan: Principal Problem:   Generalized weakness Active Problems:   COVID-19 virus infection   Diarrhea   Dehydration   Acute prerenal azotemia   GAD (generalized anxiety disorder)             #) Generalized weakness: In review of her social history she is pretty sedentary at baseline.  She has help that comes in the house twice a day.  She spends most of her day in the sofa.  2-day duration of progressive generalized weakness, in the absence of any evidence of acute focal neurologic deficits, including no evidence of acute focal weakness to suggest acute CVA.  Suspect contribution from physiologic stress stemming from presenting acute infection in the form of COVID-19, as further detailed below.  No e/o additional infectious process at this time, including chest x-ray that showed no evidence of infiltrate to suggest pneumonia.  Urinalysis is benign and she is asymptomatic.   Suspect an additional contribution towards her generalized weakness from clinical evidence of dehydration stemming from 2 days of diarrhea and diminished oral intake which appear to be as a consequence of her COVID-19 infection.   Will further eval for any additional contributions from endocrine/metabolic sources, as detailed below.  TSH normal.  B12 on the low end of normal.     Plan: work-up and management of presenting COVID-19 infection in addition to dehydration,, as described below. PT/OT consults ordered for the AM. Fall precautions. CMP/CBC in the AM. Check TSH, serum B12 level.  Add on procalcitonin level.  Gentle IV fluids, as further detailed below.  Follow-up for result  of urinalysis.     DVT prophylaxis: SCDs Start: 11/16/24 2236     Code Status: Limited: Do not attempt resuscitation (DNR) -DNR-LIMITED -Do Not Intubate/DNI  Family Communication: I discussed with her son who is at the bedside. Disposition Plan: Home with home health. Reason for continuing need for hospitalization: PT evaluation.  Objective: Vitals:   11/17/24 0751 11/17/24 0800 11/17/24 0900 11/17/24 1000  BP: 135/68 (!) 148/76 115/65 130/68  Pulse: 78 96 84 95  Resp: 12 15 19 17   Temp: 98.5 F (36.9 C)     TempSrc: Oral     SpO2: 97% 97% 95% 96%  Weight:      Height:       No intake or output data in the 24 hours ending 11/17/24 1033 Filed Weights   11/17/24 0402  Weight: 58 kg    Examination:  Awake, alert, comfortable at present HEENT: No scleral icterus pupils are equal round and reactive to light extraocular muscles intact CV: Regular rate and rhythm S1-S2 grade 3 out of 6 systolic ejection murmur heard best over the right base Lungs: Clear throughout no wheezes rhonchi or crackles Abdomen: Soft, nontender nondistended bowel sounds present Remedies: No cyanosis clubbing edema  Data Reviewed: I have personally reviewed following labs and imaging studies  CBC: Recent Labs  Lab 11/16/24 1905  WBC 11.9*  HGB 13.7  HCT 41.0  MCV 94.9  PLT 258   Basic Metabolic Panel: Recent Labs  Lab 11/16/24 1905 11/17/24 0135  NA 138  --  K 4.0  --   CL 99  --   CO2 26  --   GLUCOSE 119*  --   BUN 21  --   CREATININE 0.60  --   CALCIUM 9.3  --   MG  --  1.8   GFR: Estimated Creatinine Clearance: 34.5 mL/min (by C-G formula based on SCr of 0.6 mg/dL). Liver Function Tests: Recent Labs  Lab 11/16/24 1905  AST 17  ALT 8  ALKPHOS 96  BILITOT 0.4  PROT 7.4  ALBUMIN 4.3   No results for input(s): LIPASE, AMYLASE in the last 168 hours. No results for input(s): AMMONIA in the last 168 hours. Coagulation Profile: No results for input(s): INR,  PROTIME in the last 168 hours. Cardiac Enzymes: No results for input(s): CKTOTAL, CKMB, CKMBINDEX, TROPONINI in the last 168 hours. ProBNP, BNP (last 5 results) No results for input(s): PROBNP, BNP in the last 8760 hours. HbA1C: No results for input(s): HGBA1C in the last 72 hours. CBG: Recent Labs  Lab 11/16/24 1940  GLUCAP 123*   Lipid Profile: No results for input(s): CHOL, HDL, LDLCALC, TRIG, CHOLHDL, LDLDIRECT in the last 72 hours. Thyroid  Function Tests: Recent Labs    11/17/24 0135  TSH 0.375  FREET4 1.17   Anemia Panel: Recent Labs    11/17/24 0135  VITAMINB12 243   Sepsis Labs: Recent Labs  Lab 11/17/24 0135  PROCALCITON 0.12    Recent Results (from the past 240 hours)  Resp panel by RT-PCR (RSV, Flu A&B, Covid) Anterior Nasal Swab     Status: Abnormal   Collection Time: 11/16/24  7:38 PM   Specimen: Anterior Nasal Swab  Result Value Ref Range Status   SARS Coronavirus 2 by RT PCR POSITIVE (A) NEGATIVE Final   Influenza A by PCR NEGATIVE NEGATIVE Final   Influenza B by PCR NEGATIVE NEGATIVE Final    Comment: (NOTE) The Xpert Xpress SARS-CoV-2/FLU/RSV plus assay is intended as an aid in the diagnosis of influenza from Nasopharyngeal swab specimens and should not be used as a sole basis for treatment. Nasal washings and aspirates are unacceptable for Xpert Xpress SARS-CoV-2/FLU/RSV testing.  Fact Sheet for Patients: bloggercourse.com  Fact Sheet for Healthcare Providers: seriousbroker.it  This test is not yet approved or cleared by the [place]  FDA and has been authorized for detection and/or diagnosis of SARS-CoV-2 by FDA under an Emergency Use Authorization (EUA). This EUA will remain in effect (meaning this test can be used) for the duration of the COVID-19 declaration under Section 564(b)(1) of the Act, 21 U.S.C. section 360bbb-3(b)(1), unless the authorization is  terminated or revoked.     Resp Syncytial Virus by PCR NEGATIVE NEGATIVE Final    Comment: (NOTE) Fact Sheet for Patients: bloggercourse.com  Fact Sheet for Healthcare Providers: seriousbroker.it  This test is not yet approved or cleared by the [place]  FDA and has been authorized for detection and/or diagnosis of SARS-CoV-2 by FDA under an Emergency Use Authorization (EUA). This EUA will remain in effect (meaning this test can be used) for the duration of the COVID-19 declaration under Section 564(b)(1) of the Act, 21 U.S.C. section 360bbb-3(b)(1), unless the authorization is terminated or revoked.  Performed at [facility], [address], [place], [place] [address]      Radiology Studies: CT ABDOMEN PELVIS W CONTRAST Result Date: 11/16/2024 EXAM: CT ABDOMEN AND PELVIS WITH CONTRAST 11/16/2024 10:21:03 PM TECHNIQUE: CT of the abdomen and pelvis was performed with the administration of intravenous contrast.  Multiplanar reformatted images are provided for review. Automated exposure control, iterative reconstruction, and/or weight-based adjustment of the mA/kV was utilized to reduce the radiation dose to as low as reasonably achievable. COMPARISON: 08/11/2024. CLINICAL HISTORY: Clinical history of colon carcinoma with abdominal pain and diarrhea. FINDINGS: LOWER CHEST: No acute abnormality. LIVER: The liver is unremarkable. GALLBLADDER AND BILE DUCTS: The gallbladder has been surgically removed. No biliary ductal dilatation. SPLEEN: No acute abnormality. PANCREAS: No acute abnormality. ADRENAL GLANDS: Right adrenal adenoma is noted to be stable in appearance from the prior exam. This is stable, dating back to 2020. KIDNEYS, URETERS AND BLADDER: Parapelvic cysts are noted, greater on the left than the right. Additional left cortical cysts are seen. Nonobstructing stones are noted in the lower pole of the left kidney. The largest of  these measures 5 mm in dimension. The overall appearance is stable. No obstructive changes are noted. No hydronephrosis. No perinephric or periureteral stranding. The bladder is partially distended. GI AND BOWEL: The stomach is within normal limits. Scattered diverticular change of the colon is noted. Changes consistent with prior right colectomy are again noted with patent small bowel to colonic anastomosis. No small bowel obstructive changes are seen. There is no bowel obstruction. PERITONEUM AND RETROPERITONEUM: No ascites. No free air. VASCULATURE: Aortic calcifications are seen without aneurysmal dilatation. LYMPH NODES: No lymphadenopathy. REPRODUCTIVE ORGANS: No acute abnormality. BONES AND SOFT TISSUES: Postsurgical changes are noted in the proximal femurs bilaterally. Degenerative changes of the lumbar spine are seen. Changes of prior vertebral augmentation are noted at L2 and L3, chronic compression deformities of T12, L1, L4, and L5 are again noted. No acute osseous abnormality. No focal soft tissue abnormality. IMPRESSION: 1. No acute findings in the abdomen or pelvis to account for abdominal pain and diarrhea. 2. Stable postsurgical changes of prior right colectomy with patent small bowel to colonic anastomosis and no small bowel obstruction. 3. No evidence of metastatic disease. Electronically signed by: Oneil Devonshire MD 11/16/2024 10:27 PM EST RP Workstation: [id number]   DG Chest Portable 1 View Result Date: 11/16/2024 EXAM: 1 VIEW(S) XRAY OF THE CHEST 11/16/2024 07:20:00 PM COMPARISON: 02/23/2024 CLINICAL HISTORY: Query pneumonia. FINDINGS: LUNGS AND PLEURA: Low lung volumes. No focal pulmonary opacity. No pleural effusion. No pneumothorax. HEART AND MEDIASTINUM: Aortic atherosclerosis. No acute abnormality of the cardiac silhouette. BONES AND SOFT TISSUES: Surgical clips in right upper quadrant. Diffuse osteopenia. Vertebroplasty changes in lumbar spine. No acute osseous abnormality. IMPRESSION:  1. No acute cardiopulmonary abnormality, including no focal airspace consolidation to suggest pneumonia. Electronically signed by: Elsie Gravely MD 11/16/2024 07:29 PM EST RP Workstation: [id number]    Scheduled Meds:  HYDROcodone -acetaminophen   1 tablet Oral BID   ondansetron  (ZOFRAN ) IV  4 mg Intravenous Once   sertraline   50 mg Oral Daily   Continuous Infusions:  lactated ringers  50 mL/hr at 11/17/24 0752     LOS: 0 days   Time spent: 40 minutes  Lonni KANDICE Moose, MD  [facility]  11/17/2024, 10:33 AM   "

## 2024-11-17 NOTE — ED Notes (Signed)
 Patient brief changed and bedside cleaning done. Redness noted to sacral area. Mepilex applied. Patient states it is too painful to lay on her side and that she also just voids in her brief but will try to let staff know when she needs to void. Sitting up eating breakfast at this time.

## 2024-11-18 LAB — BASIC METABOLIC PANEL WITH GFR
Anion gap: 9 (ref 5–15)
BUN: 18 mg/dL (ref 8–23)
CO2: 26 mmol/L (ref 22–32)
Calcium: 8.9 mg/dL (ref 8.9–10.3)
Chloride: 100 mmol/L (ref 98–111)
Creatinine, Ser: 0.51 mg/dL (ref 0.44–1.00)
GFR, Estimated: >60 mL/min
Glucose, Bld: 92 mg/dL (ref 70–99)
Potassium: 3.7 mmol/L (ref 3.5–5.1)
Sodium: 135 mmol/L (ref 135–145)

## 2024-11-18 NOTE — Progress Notes (Signed)
 " PROGRESS NOTE    Frances Ortiz  FMW:[id number] DOB: 18-Sep-1931 DOA: 11/16/2024 PCP: Leonel Cole, MD  Subjective: No family  at the bedside.  Patient feels well.  No diarrhea since admit.  Denies cough, sore throat, anosmia, shortness of breath.  Has chronic back pain unchanged.  Denies abdominal or chest pain. PT eval appreciated. She can go home with H/H and family will need to arrange 24/7 care.   Hospital Course:    Assessment and Plan: Principal Problem:   Generalized weakness Active Problems:   COVID-19 virus infection   Diarrhea   Dehydration   Acute prerenal azotemia   GAD (generalized anxiety disorder)             #) Generalized weakness: In review of her social history she is pretty sedentary at baseline.  She has help that comes in the house twice a day.  She spends most of her day in the sofa.  2-day duration of progressive generalized weakness, in the absence of any evidence of acute focal neurologic deficits, including no evidence of acute focal weakness to suggest acute CVA.  Suspect contribution from physiologic stress stemming from presenting acute infection in the form of COVID-19, as further detailed below.  No e/o additional infectious process at this time, including chest x-ray that showed no evidence of infiltrate to suggest pneumonia.  Urinalysis is benign and she is asymptomatic.   Suspect an additional contribution towards her generalized weakness from clinical evidence of dehydration stemming from 2 days of diarrhea and diminished oral intake which appear to be as a consequence of her COVID-19 infection.   Will further eval for any additional contributions from endocrine/metabolic sources, as detailed below.  TSH normal.  B12 on the low end of normal.     Plan: work-up and management of presenting COVID-19 infection in addition to dehydration,, as described below. PT/OT consults ordered for the AM. Fall precautions. CMP/CBC in the AM. Check TSH, serum B12  level.  Add on procalcitonin level.  Gentle IV fluids, as further detailed below.  Follow-up for result of urinalysis.     DVT prophylaxis: SCDs Start: 11/16/24 2236     Code Status: Limited: Do not attempt resuscitation (DNR) -DNR-LIMITED -Do Not Intubate/DNI  Family Communication: I discussed with her son who is at the bedside. Disposition Plan: Home with home health. Reason for continuing need for hospitalization: PT evaluation.  Objective: Vitals:   11/17/24 2130 11/18/24 0617 11/18/24 0700 11/18/24 1146  BP:  120/74 134/63 (!) 141/67  Pulse: 87 85 77 75  Resp: 18 18 16 18   Temp: 98.6 F (37 C) 98.6 F (37 C) 97.8 F (36.6 C) 97.9 F (36.6 C)  TempSrc: Oral Oral Oral   SpO2:  96% 95% 95%  Weight:      Height:        Intake/Output Summary (Last 24 hours) at 11/18/2024 1228 Last data filed at 11/18/2024 9382 Gross per 24 hour  Intake 500 ml  Output 300 ml  Net 200 ml   Filed Weights   11/17/24 0402  Weight: 58 kg    Examination:  Awake, alert, comfortable at present HEENT: No scleral icterus pupils are equal round and reactive to light extraocular muscles intact CV: Regular rate and rhythm S1-S2 grade 3 out of 6 systolic ejection murmur heard best over the right base Lungs: Clear throughout no wheezes rhonchi or crackles Abdomen: Soft, nontender nondistended bowel sounds present Remedies: No cyanosis clubbing edema  Data Reviewed: I  have personally reviewed following labs and imaging studies  CBC: Recent Labs  Lab 11/16/24 1905 11/17/24 1516  WBC 11.9* 6.6  NEUTROABS  --  4.0  HGB 13.7 12.0  HCT 41.0 35.3*  MCV 94.9 95.1  PLT 258 221   Basic Metabolic Panel: Recent Labs  Lab 11/16/24 1905 11/17/24 0135 11/17/24 1516 11/18/24 0240  NA 138  --  138 135  K 4.0  --  3.9 3.7  CL 99  --  103 100  CO2 26  --  27 26  GLUCOSE 119*  --  111* 92  BUN 21  --  20 18  CREATININE 0.60  --  0.58 0.51  CALCIUM 9.3  --  8.8* 8.9  MG  --  1.8 2.0  --    PHOS  --   --  3.3  --    GFR: Estimated Creatinine Clearance: 34.5 mL/min (by C-G formula based on SCr of 0.51 mg/dL). Liver Function Tests: Recent Labs  Lab 11/16/24 1905 11/17/24 1516  AST 17 17  ALT 8 8  ALKPHOS 96 80  BILITOT 0.4 0.3  PROT 7.4 6.3*  ALBUMIN 4.3 3.8   No results for input(s): LIPASE, AMYLASE in the last 168 hours. No results for input(s): AMMONIA in the last 168 hours. Coagulation Profile: No results for input(s): INR, PROTIME in the last 168 hours. Cardiac Enzymes: No results for input(s): CKTOTAL, CKMB, CKMBINDEX, TROPONINI in the last 168 hours. ProBNP, BNP (last 5 results) No results for input(s): PROBNP, BNP in the last 8760 hours. HbA1C: No results for input(s): HGBA1C in the last 72 hours. CBG: Recent Labs  Lab 11/16/24 1940  GLUCAP 123*   Lipid Profile: No results for input(s): CHOL, HDL, LDLCALC, TRIG, CHOLHDL, LDLDIRECT in the last 72 hours. Thyroid  Function Tests: Recent Labs    11/17/24 0135  TSH 0.375  FREET4 1.17   Anemia Panel: Recent Labs    11/17/24 0135  VITAMINB12 243   Sepsis Labs: Recent Labs  Lab 11/17/24 0135  PROCALCITON 0.12    Recent Results (from the past 240 hours)  Resp panel by RT-PCR (RSV, Flu A&B, Covid) Anterior Nasal Swab     Status: Abnormal   Collection Time: 11/16/24  7:38 PM   Specimen: Anterior Nasal Swab  Result Value Ref Range Status   SARS Coronavirus 2 by RT PCR POSITIVE (A) NEGATIVE Final   Influenza A by PCR NEGATIVE NEGATIVE Final   Influenza B by PCR NEGATIVE NEGATIVE Final    Comment: (NOTE) The Xpert Xpress SARS-CoV-2/FLU/RSV plus assay is intended as an aid in the diagnosis of influenza from Nasopharyngeal swab specimens and should not be used as a sole basis for treatment. Nasal washings and aspirates are unacceptable for Xpert Xpress SARS-CoV-2/FLU/RSV testing.  Fact Sheet for Patients: bloggercourse.com  Fact  Sheet for Healthcare Providers: seriousbroker.it  This test is not yet approved or cleared by the [place]  FDA and has been authorized for detection and/or diagnosis of SARS-CoV-2 by FDA under an Emergency Use Authorization (EUA). This EUA will remain in effect (meaning this test can be used) for the duration of the COVID-19 declaration under Section 564(b)(1) of the Act, 21 U.S.C. section 360bbb-3(b)(1), unless the authorization is terminated or revoked.     Resp Syncytial Virus by PCR NEGATIVE NEGATIVE Final    Comment: (NOTE) Fact Sheet for Patients: bloggercourse.com  Fact Sheet for Healthcare Providers: seriousbroker.it  This test is not yet approved or cleared by the [place]  FDA  and has been authorized for detection and/or diagnosis of SARS-CoV-2 by FDA under an Emergency Use Authorization (EUA). This EUA will remain in effect (meaning this test can be used) for the duration of the COVID-19 declaration under Section 564(b)(1) of the Act, 21 U.S.C. section 360bbb-3(b)(1), unless the authorization is terminated or revoked.  Performed at [facility], [address], [place], [place] [address]      Radiology Studies: CT ABDOMEN PELVIS W CONTRAST Result Date: 11/16/2024 EXAM: CT ABDOMEN AND PELVIS WITH CONTRAST 11/16/2024 10:21:03 PM TECHNIQUE: CT of the abdomen and pelvis was performed with the administration of intravenous contrast. Multiplanar reformatted images are provided for review. Automated exposure control, iterative reconstruction, and/or weight-based adjustment of the mA/kV was utilized to reduce the radiation dose to as low as reasonably achievable. COMPARISON: 08/11/2024. CLINICAL HISTORY: Clinical history of colon carcinoma with abdominal pain and diarrhea. FINDINGS: LOWER CHEST: No acute abnormality. LIVER: The liver is unremarkable. GALLBLADDER AND BILE DUCTS: The  gallbladder has been surgically removed. No biliary ductal dilatation. SPLEEN: No acute abnormality. PANCREAS: No acute abnormality. ADRENAL GLANDS: Right adrenal adenoma is noted to be stable in appearance from the prior exam. This is stable, dating back to 2020. KIDNEYS, URETERS AND BLADDER: Parapelvic cysts are noted, greater on the left than the right. Additional left cortical cysts are seen. Nonobstructing stones are noted in the lower pole of the left kidney. The largest of these measures 5 mm in dimension. The overall appearance is stable. No obstructive changes are noted. No hydronephrosis. No perinephric or periureteral stranding. The bladder is partially distended. GI AND BOWEL: The stomach is within normal limits. Scattered diverticular change of the colon is noted. Changes consistent with prior right colectomy are again noted with patent small bowel to colonic anastomosis. No small bowel obstructive changes are seen. There is no bowel obstruction. PERITONEUM AND RETROPERITONEUM: No ascites. No free air. VASCULATURE: Aortic calcifications are seen without aneurysmal dilatation. LYMPH NODES: No lymphadenopathy. REPRODUCTIVE ORGANS: No acute abnormality. BONES AND SOFT TISSUES: Postsurgical changes are noted in the proximal femurs bilaterally. Degenerative changes of the lumbar spine are seen. Changes of prior vertebral augmentation are noted at L2 and L3, chronic compression deformities of T12, L1, L4, and L5 are again noted. No acute osseous abnormality. No focal soft tissue abnormality. IMPRESSION: 1. No acute findings in the abdomen or pelvis to account for abdominal pain and diarrhea. 2. Stable postsurgical changes of prior right colectomy with patent small bowel to colonic anastomosis and no small bowel obstruction. 3. No evidence of metastatic disease. Electronically signed by: Oneil Devonshire MD 11/16/2024 10:27 PM EST RP Workstation: [id number]   DG Chest Portable 1 View Result Date:  11/16/2024 EXAM: 1 VIEW(S) XRAY OF THE CHEST 11/16/2024 07:20:00 PM COMPARISON: 02/23/2024 CLINICAL HISTORY: Query pneumonia. FINDINGS: LUNGS AND PLEURA: Low lung volumes. No focal pulmonary opacity. No pleural effusion. No pneumothorax. HEART AND MEDIASTINUM: Aortic atherosclerosis. No acute abnormality of the cardiac silhouette. BONES AND SOFT TISSUES: Surgical clips in right upper quadrant. Diffuse osteopenia. Vertebroplasty changes in lumbar spine. No acute osseous abnormality. IMPRESSION: 1. No acute cardiopulmonary abnormality, including no focal airspace consolidation to suggest pneumonia. Electronically signed by: Elsie Gravely MD 11/16/2024 07:29 PM EST RP Workstation: [id number]    Scheduled Meds:  HYDROcodone -acetaminophen   1 tablet Oral BID   nystatin    Topical BID   ondansetron  (ZOFRAN ) IV  4 mg Intravenous Once   sertraline   50 mg Oral Daily   Continuous Infusions:  LOS: 0 days   Time spent: 40 minutes  Lonni KANDICE Moose, MD  [facility]  11/18/2024, 12:28 PM   "

## 2024-11-18 NOTE — Care Management Obs Status (Signed)
 MEDICARE OBSERVATION STATUS NOTIFICATION   Patient Details  Name: Frances Ortiz MRN: [id number] Date of Birth: 13-Jan-1931   Medicare Observation Status Notification Given:  Yes   Verbally reviewed observation notice with Tilton Munroe telephonically at [phone].  Will mail a copy of this document to the patients home address.   Latamara Melder 11/18/2024, 8:23 AM

## 2024-11-18 NOTE — Evaluation (Signed)
 Physical Therapy Evaluation Patient Details Name: Frances Ortiz MRN: [id number] DOB: [birth year] Today's Date: 11/18/2024  History of Present Illness  Frances Ortiz is a 89 y.o. female who presents to the ED for diarrhea and weakness. Found to have COVID-19. PMH: hyperlipidemia, anxiety/depression, cecal cancer s/p R colectomy 2018, GERD, osteoporosis.  Clinical Impression  Currently pt is presenting at Max A for bed mobility, Mod to Min A for sit to stand/transfers with RW and Min to Mod A for short distance gait. Pt has assistance at home but will need increased physical assistance 24/7. Pt prefers to return home. If pt has 24/7 physical assistance this would be a viable option. Due to pt current functional status, home set up and available assistance at home recommending skilled physical therapy services in order to address strength, balance and functional mobility to decrease risk for falls, injury, immobility, skin break down and re-hospitalization.          If plan is discharge home, recommend the following: A little help with walking and/or transfers;Help with stairs or ramp for entrance;Assist for transportation;Assistance with cooking/housework   Can travel by private vehicle   Yes    Equipment Recommendations None recommended by PT     Functional Status Assessment Patient has had a recent decline in their functional status and demonstrates the ability to make significant improvements in function in a reasonable and predictable amount of time.     Precautions / Restrictions Precautions Precautions: Fall Recall of Precautions/Restrictions: Intact Restrictions Weight Bearing Restrictions Per Provider Order: No      Mobility  Bed Mobility Overal bed mobility: Needs Assistance Bed Mobility: Supine to Sit     Supine to sit: Max assist     General bed mobility comments: Pt requires Max A for LE and Mod A for trunk to midline    Transfers Overall transfer level:  Needs assistance Equipment used: Rolling walker (2 wheels) Transfers: Sit to/from Stand, Bed to chair/wheelchair/BSC Sit to Stand: Mod assist   Step pivot transfers: Min assist       General transfer comment: Mod A for sit to stand from EOB and Min A for stepping from EOB to chair.    Ambulation/Gait Ambulation/Gait assistance: Min assist, Mod assist Gait Distance (Feet): 12 Feet Assistive device: Rolling walker (2 wheels) Gait Pattern/deviations: Step-to pattern, Shuffle, Trunk flexed Gait velocity: decreased Gait velocity interpretation: <1.31 ft/sec, indicative of household ambulator   General Gait Details: pt has AD significantly anterior to person; multiple cues to bring AD closer to body habitus. Pt vomited after 3 ft of gait in standing. Pt continued with gait and requires Mod A navigating AD in order to perform turns to get to recliner.    Balance Overall balance assessment: Needs assistance Sitting-balance support: Bilateral upper extremity supported Sitting balance-Leahy Scale: Fair   Postural control: Posterior lean Standing balance support: Bilateral upper extremity supported, During functional activity, Reliant on assistive device for balance Standing balance-Leahy Scale: Poor Standing balance comment: Min A for balance, posterior lean throughout.       Pertinent Vitals/Pain Pain Assessment Pain Assessment: Faces Faces Pain Scale: Hurts little more Pain Location: low back, chronic Pain Descriptors / Indicators: Discomfort Pain Intervention(s): Limited activity within patient's tolerance, Monitored during session, Repositioned    Home Living Family/patient expects to be discharged to:: Private residence Living Arrangements: Alone Available Help at Discharge: Personal care attendant;Available PRN/intermittently Type of Home: House Home Access: Level entry       Home Layout:  One level Home Equipment: Shower seat;Cane - single point;Rollator (4  wheels);Rolling Walker (2 wheels);Lift chair Additional Comments: caregivers 4 hours morning and night 7 days/wk    Prior Function Prior Level of Function : Needs assist;History of Falls (last six months)             Mobility Comments: PCA with patinet for all aspects of mobility and ADLs, pt uses a RW in the home and a rollator outside of the home. Caregivers help her get in/out of bed, to stand and turn to recliner. Pt reports she does not typically walk with RW more than about 12ft at a time ADLs Comments: PCA assists with all aspects of ADLs, pt uses brief for toileting and PCA assists with hygiene in the bathroom. PCA proivde all meals and med mgmt     Extremity/Trunk Assessment   Upper Extremity Assessment Upper Extremity Assessment: Defer to OT evaluation    Lower Extremity Assessment Lower Extremity Assessment: Generalized weakness    Cervical / Trunk Assessment Cervical / Trunk Assessment: Kyphotic  Communication   Communication Communication: No apparent difficulties    Cognition Arousal: Alert Behavior During Therapy: WFL for tasks assessed/performed   PT - Cognitive impairments: Safety/Judgement       Following commands: Intact       Cueing Cueing Techniques: Verbal cues     General Comments General comments (skin integrity, edema, etc.): Pt vomited during gait; denies dizziness/lightheadedness. 2 small pencil eraser sized brown coffee ground looking spots in emesis. RN was notified and emesis was left for RN to inspect.        Assessment/Plan    PT Assessment Patient needs continued PT services  PT Problem List Decreased strength;Decreased activity tolerance;Decreased balance;Decreased mobility;Decreased safety awareness;Pain       PT Treatment Interventions DME instruction;Balance training;Gait training;Stair training;Functional mobility training;Therapeutic activities;Therapeutic exercise;Patient/family education    PT Goals (Current goals can  be found in the Care Plan section)  Acute Rehab PT Goals Patient Stated Goal: Pt would like to go home. PT Goal Formulation: With patient Time For Goal Achievement: 12/02/24 Potential to Achieve Goals: Fair    Frequency Min 1X/week        AM-PAC PT 6 Clicks Mobility  Outcome Measure Help needed turning from your back to your side while in a flat bed without using bedrails?: A Lot Help needed moving from lying on your back to sitting on the side of a flat bed without using bedrails?: A Lot Help needed moving to and from a bed to a chair (including a wheelchair)?: A Lot Help needed standing up from a chair using your arms (e.g., wheelchair or bedside chair)?: A Lot Help needed to walk in hospital room?: A Lot Help needed climbing 3-5 steps with a railing? : Total 6 Click Score: 11    End of Session Equipment Utilized During Treatment: Gait belt Activity Tolerance: Patient limited by fatigue Patient left: in chair;with call bell/phone within reach;with chair alarm set Nurse Communication: Mobility status;Other (comment) (emesis) PT Visit Diagnosis: Unsteadiness on feet (R26.81);Other abnormalities of gait and mobility (R26.89);Muscle weakness (generalized) (M62.81)    Time: 8963-8896 PT Time Calculation (min) (ACUTE ONLY): 27 min   Charges:   PT Evaluation $PT Eval Low Complexity: 1 Low PT Treatments $Therapeutic Activity: 8-22 mins PT General Charges $$ ACUTE PT VISIT: 1 Visit       Dorothyann Maier, DPT, CLT  Acute Rehabilitation Services Office: [phone] (Secure chat preferred)   Dorothyann VEAR Maier 11/18/2024, 11:12  AM

## 2024-11-18 NOTE — Progress Notes (Signed)
 Transition of Care [facility]) - Inpatient Brief Assessment   Patient Details  Name: Frances Ortiz MRN: [id number] Date of Birth: Sep 15, 1931  [facility] of [facility] CM/SW Contact:    Rosaline JONELLE Joe, RN Phone Number: 11/18/2024, 2:18 PM   Clinical Narrative: Patient admitted to the hospital with COVID infection, generalized weakness along with nausea/vomiting an diarrhea.  Patient's daughter is at the bedside and states that patient has caregivers at the home 4 hours per day through [employer] that will be increased to likely 24 hours per day private pay.  The patient would like to return home with home health services and declines SNF placement.  DME at the home includes shower seat, cane, rolator, RW, lift chair.  Patient's daughter was provided medicare choice regarding home health after patient was faxed in the hub and daughter chose [facility].  Koleen, CM with [facility] health is aware and accepted services for [facility] - PT, OT, RN and MSW.  HH orders placed to be co-signed by MD.  MD was updated that patient states that she only had 1 bite of food from the breakfast tray this morning and did not eat her lunch tray.  The patient states that she was still nauseated with dry- heaving and has been unable to eat/drink and has not had a BM since hospitalization.  Patient states that she still felt weak after admitting with N/V/D.  Patient plans to return home with [facility] when stable in the next 1-2 days per MD.  [facility] health is set up.   Transition of Care Asessment: Insurance and Status: Insurance coverage has been reviewed Patient has primary care physician: Yes Home environment has been reviewed: (P) from home alone with private pay caregivers Prior level of function:: (P) private caregivers at the home Prior/Current Home Services: (P) Current home services (Private caregivers 4 hours per day through [employer]) Social Drivers of Health Review: (P) SDOH reviewed  needs interventions Readmission risk has been reviewed: (P) Yes Transition of care needs: (P) transition of care needs identified, TOC will continue to follow

## 2024-11-18 NOTE — Plan of Care (Signed)

## 2024-11-19 NOTE — Plan of Care (Signed)

## 2024-11-19 NOTE — Plan of Care (Signed)
" °  Problem: Education: Goal: Knowledge of risk factors and measures for prevention of condition will improve Outcome: Progressing   Problem: Coping: Goal: Psychosocial and spiritual needs will be supported Outcome: Progressing   Problem: Clinical Measurements: Goal: Respiratory complications will improve Outcome: Progressing   Problem: Nutrition: Goal: Adequate nutrition will be maintained Outcome: Progressing   Problem: Safety: Goal: Ability to remain free from injury will improve Outcome: Progressing   "

## 2024-11-19 NOTE — TOC Transition Note (Signed)
 Transition of Care [facility]) - Discharge Note   Patient Details  Name: Frances Ortiz MRN: [id number] Date of Birth: [birth year]  Transition of Care [facility]) CM/SW Contact:  Rosaline JONELLE Joe, RN Phone Number: 11/19/2024, 4:09 PM   Clinical Narrative:    CM spoke with the MD and the patient was able to eat and drink today without nausea.  I called and spoke with the patient' daughter by phone and patient will be able to return home tomorrow morning before 9 am.  The patient's daughter has lined up private pay caregivers and they will be available tomorrow.  Patient has [facility] in place for PT, OT and CM was notified of patient's pending discharge to home tomorrow.  Patient will discharge home in the daughter's car tomorrow before 9 am.  Consulting Civil Engineer and bedside nursing is aware.  MD is aware of the plan.         Patient Goals and CMS Choice            Discharge Placement                       Discharge Plan and Services Additional resources added to the After Visit Summary for                                       Social Drivers of Health (SDOH) Interventions SDOH Screenings   Food Insecurity: No Food Insecurity (11/17/2024)  Housing: Low Risk (11/17/2024)  Transportation Needs: No Transportation Needs (11/17/2024)  Utilities: At Risk (11/17/2024)  Social Connections: Moderately Isolated (11/17/2024)  Tobacco Use: Low Risk (11/17/2024)     Readmission Risk Interventions     No data to display

## 2024-11-20 MED ORDER — ACETAMINOPHEN 325 MG PO TABS: 650.0000 mg | ORAL_TABLET | ORAL | Status: AC | PRN

## 2024-11-20 MED ORDER — BENZONATATE 100 MG PO CAPS: 100.0000 mg | ORAL_CAPSULE | Freq: Three times a day (TID) | ORAL | 0 refills | Status: AC | PRN

## 2024-11-20 MED ORDER — POLYETHYLENE GLYCOL 3350 17 G PO PACK: 17.0000 g | PACK | Freq: Every day | ORAL | Status: AC | PRN

## 2024-11-20 MED ORDER — ONDANSETRON 4 MG PO TBDP: 4.0000 mg | ORAL_TABLET | Freq: Three times a day (TID) | ORAL | 0 refills | Status: AC | PRN

## 2024-11-20 MED ORDER — NYSTATIN 100000 UNIT/GM EX POWD: Freq: Two times a day (BID) | CUTANEOUS | 0 refills | Status: AC

## 2024-11-20 MED ORDER — POLYETHYLENE GLYCOL 3350 17 G PO PACK: 17.0000 g | PACK | Freq: Every day | ORAL | Status: DC | PRN

## 2024-11-20 NOTE — Discharge Summary (Signed)
 " Physician Discharge Summary   Patient: Frances Ortiz MRN: [id number] DOB: 1931-03-27  Admit date:     11/16/2024  Discharge date: 11/20/24  Discharge Physician: Lonni KANDICE Moose   PCP: Leonel Cole, MD   Recommendations at discharge:    Follow up with PCP in 1-2 weeks.  Discharge Diagnoses: Principal Problem:   Generalized weakness Active Problems:   COVID-19 virus infection   Diarrhea   Dehydration   Acute prerenal azotemia   GAD (generalized anxiety disorder)  Resolved Problems:   * No resolved hospital problems. *  Chief Complaint: Generalized weakness   HPI: Frances Ortiz is a 89 y.o. female with medical history significant for generalized anxiety disorder, who is admitted to [facility] on 11/16/2024 with generalized weakness in the setting of COVID-19 infection after presenting from home to [facility] ED complaining of generalized weakness.    The following history is provided by the patient as well as the patient's daughter, in addition to my discussions with the EDP and via chart review.   The patient reports 2 days of generalized weakness, progressive in nature, in the absence of any associated acute focal weakness.  This been associated with at least 3 daily episodes of loose watery stools occurring over the last 2 days, in the absence of any associated melena or hematochezia.  Not associate with any abdominal discomfort.  She also notes some intermittent nausea in the absence of vomiting, along with subjective fever.  Denies any associated cough, shortness of breath, chest pain, wheezing, headache, neck stiffness.  He also denies any recent dysuria or gross hematuria.   The patient lives at home by herself, and in the setting of the progressive generalized weakness over the course the last 2 days as noted it progressively more difficult to perform her ADLs independently.    Hospital Course: Patient's course has been essentially unremarkable.   Appetite seems to  be improving.  No respiratory symptoms.  No bowel movement yet.  She states she is passing gas.  Stable for discharge.  Family would like for her to have a bowel movement but that may not happen for several days and I am concerned about her sitting in the bed deconditioning.  She is stable for discharge once home arrangements are made.   Assessment and Plan: 1.  Generalized weakness.  Needs home physical therapy.  Needs 24-hour supervision which is being arranged by family.   2.  Nausea vomiting diarrhea.  May be secondary to COVID-19.  Seems to have resolved.  Appetite has improved.   3.  Hypertension.  Asymptomatic.  No need to intensify therapy at this point.  Follow-up with outpatient providers.  4. COVID-19.  No respiratory symptoms.  No hypoxemia.  Lungs were clear with no chest x-ray.  Patient did not qualify for remdesivir or dexamethasone .        Consultants: None Procedures performed: None Disposition: Home with HH, 24/7 caregivers Diet recommendation:  Discharge Diet Orders (From admission, onward)     Start     Ordered   11/20/24 0000  Diet general        11/20/24 0749           Regular diet DISCHARGE MEDICATION: Allergies as of 11/20/2024       Reactions   Aristocort  [triamcinolone ] Other (See Comments)   Emotional instability   Cortisone Other (See Comments)   Emotional instability   Erythromycin Other (See Comments)   Stomach pain   Hydrocodone -acetaminophen  Rash  Medication List     TAKE these medications    acetaminophen  325 MG tablet Commonly known as: TYLENOL  Take 2 tablets (650 mg total) by mouth every 4 (four) hours as needed for mild pain (pain score 1-3) (or Fever >/= 101).   benzonatate  100 MG capsule Commonly known as: TESSALON  Take 1 capsule (100 mg total) by mouth 3 (three) times daily as needed for cough.   HYDROcodone -acetaminophen  5-325 MG tablet Commonly known as: NORCO/VICODIN Take 1 tablet by mouth in the morning and  at bedtime.   nystatin  powder Commonly known as: MYCOSTATIN /NYSTOP  Apply topically 2 (two) times daily.   ondansetron  4 MG disintegrating tablet Commonly known as: ZOFRAN -ODT Take 1 tablet (4 mg total) by mouth every 8 (eight) hours as needed for nausea or vomiting.   polyethylene glycol 17 g packet Commonly known as: MIRALAX  / GLYCOLAX  Take 17 g by mouth daily as needed for moderate constipation or severe constipation.   sertraline  50 MG tablet Commonly known as: ZOLOFT  Take 50 mg by mouth daily.        Contact information for follow-up providers     Leonel Cole, MD Follow up.   Specialty: Family Medicine Why: Please call the office and schedule a hospital follow up in the next 7-10 days. Contact information: [address] [place] [address] [phone]              Contact information for after-discharge care     Home Medical Care     [facility]) .   Service: Home Health Services Contact information: [address] [place]  [address] [phone]                    Discharge Exam: Frances Ortiz   11/17/24 0402 11/19/24 0500 11/20/24 0500  Weight: 58 kg 58 kg 57.8 kg   Alert nontoxic, comfortable HEENT: PERRLA Cabbell EOMI no scleral icterus oropharynx clear CV: Regular rate and rhythm S1-S2 no murmurs rubs gallops Lungs: Clear throughout with good air entry  abdomen: Soft, nontender nondistended with normal bowel sounds Extremities: No cyanosis clubbing or edema Logic: Grossly nonfocal   Condition at discharge: good  The results of significant diagnostics from this hospitalization (including imaging, microbiology, ancillary and laboratory) are listed below for reference.   Imaging Studies: CT ABDOMEN PELVIS W CONTRAST Result Date: 11/16/2024 EXAM: CT ABDOMEN AND PELVIS WITH CONTRAST 11/16/2024 10:21:03 PM TECHNIQUE: CT of the abdomen and pelvis was performed with the  administration of intravenous contrast. Multiplanar reformatted images are provided for review. Automated exposure control, iterative reconstruction, and/or weight-based adjustment of the mA/kV was utilized to reduce the radiation dose to as low as reasonably achievable. COMPARISON: 08/11/2024. CLINICAL HISTORY: Clinical history of colon carcinoma with abdominal pain and diarrhea. FINDINGS: LOWER CHEST: No acute abnormality. LIVER: The liver is unremarkable. GALLBLADDER AND BILE DUCTS: The gallbladder has been surgically removed. No biliary ductal dilatation. SPLEEN: No acute abnormality. PANCREAS: No acute abnormality. ADRENAL GLANDS: Right adrenal adenoma is noted to be stable in appearance from the prior exam. This is stable, dating back to 2020. KIDNEYS, URETERS AND BLADDER: Parapelvic cysts are noted, greater on the left than the right. Additional left cortical cysts are seen. Nonobstructing stones are noted in the lower pole of the left kidney. The largest of these measures 5 mm in dimension. The overall appearance is stable. No obstructive changes are noted. No hydronephrosis. No perinephric or periureteral stranding. The bladder is partially  distended. GI AND BOWEL: The stomach is within normal limits. Scattered diverticular change of the colon is noted. Changes consistent with prior right colectomy are again noted with patent small bowel to colonic anastomosis. No small bowel obstructive changes are seen. There is no bowel obstruction. PERITONEUM AND RETROPERITONEUM: No ascites. No free air. VASCULATURE: Aortic calcifications are seen without aneurysmal dilatation. LYMPH NODES: No lymphadenopathy. REPRODUCTIVE ORGANS: No acute abnormality. BONES AND SOFT TISSUES: Postsurgical changes are noted in the proximal femurs bilaterally. Degenerative changes of the lumbar spine are seen. Changes of prior vertebral augmentation are noted at L2 and L3, chronic compression deformities of T12, L1, L4, and L5 are again  noted. No acute osseous abnormality. No focal soft tissue abnormality. IMPRESSION: 1. No acute findings in the abdomen or pelvis to account for abdominal pain and diarrhea. 2. Stable postsurgical changes of prior right colectomy with patent small bowel to colonic anastomosis and no small bowel obstruction. 3. No evidence of metastatic disease. Electronically signed by: Oneil Devonshire MD 11/16/2024 10:27 PM EST RP Workstation: [id number]   DG Chest Portable 1 View Result Date: 11/16/2024 EXAM: 1 VIEW(S) XRAY OF THE CHEST 11/16/2024 07:20:00 PM COMPARISON: 02/23/2024 CLINICAL HISTORY: Query pneumonia. FINDINGS: LUNGS AND PLEURA: Low lung volumes. No focal pulmonary opacity. No pleural effusion. No pneumothorax. HEART AND MEDIASTINUM: Aortic atherosclerosis. No acute abnormality of the cardiac silhouette. BONES AND SOFT TISSUES: Surgical clips in right upper quadrant. Diffuse osteopenia. Vertebroplasty changes in lumbar spine. No acute osseous abnormality. IMPRESSION: 1. No acute cardiopulmonary abnormality, including no focal airspace consolidation to suggest pneumonia. Electronically signed by: Elsie Gravely MD 11/16/2024 07:29 PM EST RP Workstation: [id number]    Microbiology: Results for orders placed or performed during the hospital encounter of 11/16/24  Resp panel by RT-PCR (RSV, Flu A&B, Covid) Anterior Nasal Swab     Status: Abnormal   Collection Time: 11/16/24  7:38 PM   Specimen: Anterior Nasal Swab  Result Value Ref Range Status   SARS Coronavirus 2 by RT PCR POSITIVE (A) NEGATIVE Final   Influenza A by PCR NEGATIVE NEGATIVE Final   Influenza B by PCR NEGATIVE NEGATIVE Final    Comment: (NOTE) The Xpert Xpress SARS-CoV-2/FLU/RSV plus assay is intended as an aid in the diagnosis of influenza from Nasopharyngeal swab specimens and should not be used as a sole basis for treatment. Nasal washings and aspirates are unacceptable for Xpert Xpress SARS-CoV-2/FLU/RSV testing.  Fact Sheet for  Patients: bloggercourse.com  Fact Sheet for Healthcare Providers: seriousbroker.it  This test is not yet approved or cleared by the [place]  FDA and has been authorized for detection and/or diagnosis of SARS-CoV-2 by FDA under an Emergency Use Authorization (EUA). This EUA will remain in effect (meaning this test can be used) for the duration of the COVID-19 declaration under Section 564(b)(1) of the Act, 21 U.S.C. section 360bbb-3(b)(1), unless the authorization is terminated or revoked.     Resp Syncytial Virus by PCR NEGATIVE NEGATIVE Final    Comment: (NOTE) Fact Sheet for Patients: bloggercourse.com  Fact Sheet for Healthcare Providers: seriousbroker.it  This test is not yet approved or cleared by the [place]  FDA and has been authorized for detection and/or diagnosis of SARS-CoV-2 by FDA under an Emergency Use Authorization (EUA). This EUA will remain in effect (meaning this test can be used) for the duration of the COVID-19 declaration under Section 564(b)(1) of the Act, 21 U.S.C. section 360bbb-3(b)(1), unless the authorization is terminated or revoked.  Performed at [facility],  [address], [place], [place] [address]     Labs: CBC: Recent Labs  Lab 11/16/24 1905 11/17/24 1516  WBC 11.9* 6.6  NEUTROABS  --  4.0  HGB 13.7 12.0  HCT 41.0 35.3*  MCV 94.9 95.1  PLT 258 221   Basic Metabolic Panel: Recent Labs  Lab 11/16/24 1905 11/17/24 0135 11/17/24 1516 11/18/24 0240  NA 138  --  138 135  K 4.0  --  3.9 3.7  CL 99  --  103 100  CO2 26  --  27 26  GLUCOSE 119*  --  111* 92  BUN 21  --  20 18  CREATININE 0.60  --  0.58 0.51  CALCIUM 9.3  --  8.8* 8.9  MG  --  1.8 2.0  --   PHOS  --   --  3.3  --    Liver Function Tests: Recent Labs  Lab 11/16/24 1905 11/17/24 1516  AST 17 17  ALT 8 8  ALKPHOS 96 80  BILITOT 0.4 0.3  PROT  7.4 6.3*  ALBUMIN 4.3 3.8   CBG: Recent Labs  Lab 11/16/24 1940  GLUCAP 123*    Discharge time spent: less than 30 minutes.  Signed: Lonni KANDICE Moose, MD [facility] 11/20/2024 "

## 2024-11-20 NOTE — Progress Notes (Addendum)
 " PROGRESS NOTE    Frances Ortiz  FMW:[id number] DOB: [birth year] DOA: 11/16/2024 PCP: Leonel Cole, MD Catch-up note.  Patient was seen yesterday morning.  Progress note not done because I expected to do a discharge summary yesterday. Subjective:  Patient was seen yesterday about 9:30 AM.  According to nursing she ate a good breakfast.  Patient was sitting up in bed looking at the menu for lunch.  Told me she was hungry.  Denied nausea, vomiting or diarrhea.  Denied abdominal or chest pain.  No shortness of breath.  No family at the bedside.  Hospital Course: Patient's course has been essentially unremarkable.  Appetite seems to be improving.  No respiratory symptoms.  No bowel movement yet.  She states she is passing gas.  Stable for discharge.  Family would like for her to have a bowel movement but that may not happen for several days and I am concerned about her sitting in the bed deconditioning.  She is stable for discharge once home arrangements are made.   Assessment and Plan: 1.  Generalized weakness.  Needs home physical therapy.  Needs 24-hour supervision which is being arranged by family.  2.  Nausea vomiting diarrhea.  May be secondary to COVID-19.  Seems to have resolved.  Appetite is improving.  3.  Hypertension.  Asymptomatic.  No need to intensify therapy at this point.  Follow-up with outpatient providers.     DVT prophylaxis: SCDs Start: 11/16/24 2236     Code Status: Limited: Do not attempt resuscitation (DNR) -DNR-LIMITED -Do Not Intubate/DNI  Family Communication: Social worker has discussed plan with the patient's daughters. Disposition Plan: Home with 24/7 caregivers. Reason for continuing need for hospitalization: To ensure her oral intake is improved.  Objective: Vitals:   11/19/24 2002 11/20/24 0020 11/20/24 0423 11/20/24 0500  BP: (!) 164/76 (!) 167/77 (!) 145/57   Pulse: 93 75 67   Resp:      Temp: 98.8 F (37.1 C) 97.7 F (36.5 C) 98.9 F (37.2 C)    TempSrc:      SpO2: 96% 98% 99%   Weight:    57.8 kg  Height:        Intake/Output Summary (Last 24 hours) at 11/20/2024 0720 Last data filed at 11/20/2024 0504 Gross per 24 hour  Intake 150 ml  Output --  Net 150 ml   Filed Weights   11/17/24 0402 11/19/24 0500 11/20/24 0500  Weight: 58 kg 58 kg 57.8 kg    Examination:  Awake, alert, comfortable looks younger than stated age. HEENT: No scleral icterus pupils are equal round reactive light EOMI, oropharynx clear CV: Regular rate and rhythm S1-S2 no murmurs rubs gallops Lungs: Clear throughout with good air entry Abdomen: Soft, nontender, nondistended bowel sounds present Extremities: No cyanosis clubbing or edema Neurologic: Grossly nonfocal appears to have some mild cognitive deficits.  Data Reviewed: I have personally reviewed following labs and imaging studies  CBC: Recent Labs  Lab 11/16/24 1905 11/17/24 1516  WBC 11.9* 6.6  NEUTROABS  --  4.0  HGB 13.7 12.0  HCT 41.0 35.3*  MCV 94.9 95.1  PLT 258 221   Basic Metabolic Panel: Recent Labs  Lab 11/16/24 1905 11/17/24 0135 11/17/24 1516 11/18/24 0240  NA 138  --  138 135  K 4.0  --  3.9 3.7  CL 99  --  103 100  CO2 26  --  27 26  GLUCOSE 119*  --  111* 92  BUN 21  --  20 18  CREATININE 0.60  --  0.58 0.51  CALCIUM 9.3  --  8.8* 8.9  MG  --  1.8 2.0  --   PHOS  --   --  3.3  --    GFR: Estimated Creatinine Clearance: 34.5 mL/min (by C-G formula based on SCr of 0.51 mg/dL). Liver Function Tests: Recent Labs  Lab 11/16/24 1905 11/17/24 1516  AST 17 17  ALT 8 8  ALKPHOS 96 80  BILITOT 0.4 0.3  PROT 7.4 6.3*  ALBUMIN 4.3 3.8   No results for input(s): LIPASE, AMYLASE in the last 168 hours. No results for input(s): AMMONIA in the last 168 hours. Coagulation Profile: No results for input(s): INR, PROTIME in the last 168 hours. Cardiac Enzymes: No results for input(s): CKTOTAL, CKMB, CKMBINDEX, TROPONINI in the last 168  hours. ProBNP, BNP (last 5 results) No results for input(s): PROBNP, BNP in the last 8760 hours. HbA1C: No results for input(s): HGBA1C in the last 72 hours. CBG: Recent Labs  Lab 11/16/24 1940  GLUCAP 123*   Lipid Profile: No results for input(s): CHOL, HDL, LDLCALC, TRIG, CHOLHDL, LDLDIRECT in the last 72 hours. Thyroid  Function Tests: No results for input(s): TSH, T4TOTAL, FREET4, T3FREE, THYROIDAB in the last 72 hours. Anemia Panel: No results for input(s): VITAMINB12, FOLATE, FERRITIN, TIBC, IRON, RETICCTPCT in the last 72 hours. Sepsis Labs: Recent Labs  Lab 11/17/24 0135  PROCALCITON 0.12    Recent Results (from the past 240 hours)  Resp panel by RT-PCR (RSV, Flu A&B, Covid) Anterior Nasal Swab     Status: Abnormal   Collection Time: 11/16/24  7:38 PM   Specimen: Anterior Nasal Swab  Result Value Ref Range Status   SARS Coronavirus 2 by RT PCR POSITIVE (A) NEGATIVE Final   Influenza A by PCR NEGATIVE NEGATIVE Final   Influenza B by PCR NEGATIVE NEGATIVE Final    Comment: (NOTE) The Xpert Xpress SARS-CoV-2/FLU/RSV plus assay is intended as an aid in the diagnosis of influenza from Nasopharyngeal swab specimens and should not be used as a sole basis for treatment. Nasal washings and aspirates are unacceptable for Xpert Xpress SARS-CoV-2/FLU/RSV testing.  Fact Sheet for Patients: bloggercourse.com  Fact Sheet for Healthcare Providers: seriousbroker.it  This test is not yet approved or cleared by the [place]  FDA and has been authorized for detection and/or diagnosis of SARS-CoV-2 by FDA under an Emergency Use Authorization (EUA). This EUA will remain in effect (meaning this test can be used) for the duration of the COVID-19 declaration under Section 564(b)(1) of the Act, 21 U.S.C. section 360bbb-3(b)(1), unless the authorization is terminated or revoked.     Resp  Syncytial Virus by PCR NEGATIVE NEGATIVE Final    Comment: (NOTE) Fact Sheet for Patients: bloggercourse.com  Fact Sheet for Healthcare Providers: seriousbroker.it  This test is not yet approved or cleared by the [place]  FDA and has been authorized for detection and/or diagnosis of SARS-CoV-2 by FDA under an Emergency Use Authorization (EUA). This EUA will remain in effect (meaning this test can be used) for the duration of the COVID-19 declaration under Section 564(b)(1) of the Act, 21 U.S.C. section 360bbb-3(b)(1), unless the authorization is terminated or revoked.  Performed at [facility], [address], [place], [place] [address]      Radiology Studies: No results found.  Scheduled Meds:  HYDROcodone -acetaminophen   1 tablet Oral BID   nystatin    Topical BID   ondansetron  (ZOFRAN ) IV  4 mg Intravenous Once  sertraline   50 mg Oral Daily   Continuous Infusions:   LOS: 0 days   Time spent: 25 minutes  Lonni KANDICE Moose, MD  [facility]  11/20/2024, 7:20 AM   "

## 2024-11-20 NOTE — Progress Notes (Signed)
 " PROGRESS NOTE    Frances Ortiz  FMW:[id number] DOB: June 29, 1931 DOA: 11/16/2024 PCP: Leonel Cole, MD Catch-up note.  Patient was seen yesterday morning.  Progress note not done because I expected to do a discharge summary yesterday. Subjective:  Patient was seen yesterday about 9:30 AM.  According to nursing she ate a good breakfast.  Patient was sitting up in bed looking at the menu for lunch.  Told me she was hungry.  Denied nausea, vomiting or diarrhea.  Denied abdominal or chest pain.  No shortness of breath.  No family at the bedside. Per RN she has been eating well, no issues with N/V/diarrhea.  Hospital Course: Patient's course has been essentially unremarkable.   Appetite seems to be improving.  No respiratory symptoms.  No bowel movement yet.  She states she is passing gas.  Stable for discharge.  Family would like for her to have a bowel movement but that may not happen for several days and I am concerned about her sitting in the bed deconditioning.  She is stable for discharge once home arrangements are made.   Assessment and Plan: 1.  Generalized weakness.  Needs home physical therapy.  Needs 24-hour supervision which is being arranged by family.  2.  Nausea vomiting diarrhea.  May be secondary to COVID-19.  Seems to have resolved.  Appetite has improved.  3.  Hypertension.  Asymptomatic.  No need to intensify therapy at this point.  Follow-up with outpatient providers.     DVT prophylaxis: SCDs Start: 11/16/24 2236     Code Status: Limited: Do not attempt resuscitation (DNR) -DNR-LIMITED -Do Not Intubate/DNI  Family Communication: Social worker has discussed plan with the patient's daughters. Disposition Plan: Home with 24/7 caregivers. Reason for continuing need for hospitalization: To ensure her oral intake is improved.  Objective: Vitals:   11/19/24 2002 11/20/24 0020 11/20/24 0423 11/20/24 0500  BP: (!) 164/76 (!) 167/77 (!) 145/57   Pulse: 93 75 67   Resp:       Temp: 98.8 F (37.1 C) 97.7 F (36.5 C) 98.9 F (37.2 C)   TempSrc:      SpO2: 96% 98% 99%   Weight:    57.8 kg  Height:        Intake/Output Summary (Last 24 hours) at 11/20/2024 0747 Last data filed at 11/20/2024 0504 Gross per 24 hour  Intake 150 ml  Output --  Net 150 ml   Filed Weights   11/17/24 0402 11/19/24 0500 11/20/24 0500  Weight: 58 kg 58 kg 57.8 kg    Examination:  Awake, alert, comfortable looks younger than stated age. HEENT: No scleral icterus pupils are equal round reactive light EOMI, oropharynx clear CV: Regular rate and rhythm S1-S2 no murmurs rubs gallops Lungs: Clear throughout with good air entry Abdomen: Soft, nontender, nondistended bowel sounds present Extremities: No cyanosis clubbing or edema Neurologic: Grossly nonfocal appears to have some mild cognitive deficits.  Data Reviewed: I have personally reviewed following labs and imaging studies  CBC: Recent Labs  Lab 11/16/24 1905 11/17/24 1516  WBC 11.9* 6.6  NEUTROABS  --  4.0  HGB 13.7 12.0  HCT 41.0 35.3*  MCV 94.9 95.1  PLT 258 221   Basic Metabolic Panel: Recent Labs  Lab 11/16/24 1905 11/17/24 0135 11/17/24 1516 11/18/24 0240  NA 138  --  138 135  K 4.0  --  3.9 3.7  CL 99  --  103 100  CO2 26  --  27  26  GLUCOSE 119*  --  111* 92  BUN 21  --  20 18  CREATININE 0.60  --  0.58 0.51  CALCIUM 9.3  --  8.8* 8.9  MG  --  1.8 2.0  --   PHOS  --   --  3.3  --    GFR: Estimated Creatinine Clearance: 34.5 mL/min (by C-G formula based on SCr of 0.51 mg/dL). Liver Function Tests: Recent Labs  Lab 11/16/24 1905 11/17/24 1516  AST 17 17  ALT 8 8  ALKPHOS 96 80  BILITOT 0.4 0.3  PROT 7.4 6.3*  ALBUMIN 4.3 3.8   No results for input(s): LIPASE, AMYLASE in the last 168 hours. No results for input(s): AMMONIA in the last 168 hours. Coagulation Profile: No results for input(s): INR, PROTIME in the last 168 hours. Cardiac Enzymes: No results for input(s):  CKTOTAL, CKMB, CKMBINDEX, TROPONINI in the last 168 hours. ProBNP, BNP (last 5 results) No results for input(s): PROBNP, BNP in the last 8760 hours. HbA1C: No results for input(s): HGBA1C in the last 72 hours. CBG: Recent Labs  Lab 11/16/24 1940  GLUCAP 123*   Lipid Profile: No results for input(s): CHOL, HDL, LDLCALC, TRIG, CHOLHDL, LDLDIRECT in the last 72 hours. Thyroid  Function Tests: No results for input(s): TSH, T4TOTAL, FREET4, T3FREE, THYROIDAB in the last 72 hours. Anemia Panel: No results for input(s): VITAMINB12, FOLATE, FERRITIN, TIBC, IRON, RETICCTPCT in the last 72 hours. Sepsis Labs: Recent Labs  Lab 11/17/24 0135  PROCALCITON 0.12    Recent Results (from the past 240 hours)  Resp panel by RT-PCR (RSV, Flu A&B, Covid) Anterior Nasal Swab     Status: Abnormal   Collection Time: 11/16/24  7:38 PM   Specimen: Anterior Nasal Swab  Result Value Ref Range Status   SARS Coronavirus 2 by RT PCR POSITIVE (A) NEGATIVE Final   Influenza A by PCR NEGATIVE NEGATIVE Final   Influenza B by PCR NEGATIVE NEGATIVE Final    Comment: (NOTE) The Xpert Xpress SARS-CoV-2/FLU/RSV plus assay is intended as an aid in the diagnosis of influenza from Nasopharyngeal swab specimens and should not be used as a sole basis for treatment. Nasal washings and aspirates are unacceptable for Xpert Xpress SARS-CoV-2/FLU/RSV testing.  Fact Sheet for Patients: bloggercourse.com  Fact Sheet for Healthcare Providers: seriousbroker.it  This test is not yet approved or cleared by the [place]  FDA and has been authorized for detection and/or diagnosis of SARS-CoV-2 by FDA under an Emergency Use Authorization (EUA). This EUA will remain in effect (meaning this test can be used) for the duration of the COVID-19 declaration under Section 564(b)(1) of the Act, 21 U.S.C. section 360bbb-3(b)(1), unless  the authorization is terminated or revoked.     Resp Syncytial Virus by PCR NEGATIVE NEGATIVE Final    Comment: (NOTE) Fact Sheet for Patients: bloggercourse.com  Fact Sheet for Healthcare Providers: seriousbroker.it  This test is not yet approved or cleared by the [place]  FDA and has been authorized for detection and/or diagnosis of SARS-CoV-2 by FDA under an Emergency Use Authorization (EUA). This EUA will remain in effect (meaning this test can be used) for the duration of the COVID-19 declaration under Section 564(b)(1) of the Act, 21 U.S.C. section 360bbb-3(b)(1), unless the authorization is terminated or revoked.  Performed at [facility], [address], [place], [place] [address]      Radiology Studies: No results found.  Scheduled Meds:  HYDROcodone -acetaminophen   1 tablet Oral BID  nystatin    Topical BID   ondansetron  (ZOFRAN ) IV  4 mg Intravenous Once   sertraline   50 mg Oral Daily   Continuous Infusions:   LOS: 0 days   Time spent: 25 minutes  Lonni KANDICE Moose, MD  [facility]  11/20/2024, 7:47 AM   "

## 2024-11-20 NOTE — Plan of Care (Signed)
" °  Problem: Coping: Goal: Psychosocial and spiritual needs will be supported Outcome: Progressing   Problem: Education: Goal: Knowledge of General Education information will improve Description: Including pain rating scale, medication(s)/side effects and non-pharmacologic comfort measures Outcome: Progressing   Problem: Clinical Measurements: Goal: Ability to maintain clinical measurements within normal limits will improve Outcome: Progressing   "
